# Patient Record
Sex: Female | Born: 1960 | Race: Black or African American | Hispanic: No | Marital: Single | Smoking: Former smoker
Health system: Southern US, Community
[De-identification: ages and names within clinical notes are randomized; demographics above are authoritative.]

## PROBLEM LIST (undated history)

## (undated) DIAGNOSIS — N62 Hypertrophy of breast: Secondary | ICD-10-CM

## (undated) DIAGNOSIS — M1990 Unspecified osteoarthritis, unspecified site: Secondary | ICD-10-CM

## (undated) DIAGNOSIS — F419 Anxiety disorder, unspecified: Secondary | ICD-10-CM

## (undated) DIAGNOSIS — E785 Hyperlipidemia, unspecified: Secondary | ICD-10-CM

## (undated) DIAGNOSIS — R519 Headache, unspecified: Secondary | ICD-10-CM

## (undated) DIAGNOSIS — E669 Obesity, unspecified: Secondary | ICD-10-CM

## (undated) DIAGNOSIS — G629 Polyneuropathy, unspecified: Secondary | ICD-10-CM

## (undated) DIAGNOSIS — I10 Essential (primary) hypertension: Secondary | ICD-10-CM

## (undated) DIAGNOSIS — Z780 Asymptomatic menopausal state: Secondary | ICD-10-CM

## (undated) DIAGNOSIS — T7840XA Allergy, unspecified, initial encounter: Secondary | ICD-10-CM

## (undated) DIAGNOSIS — M79606 Pain in leg, unspecified: Secondary | ICD-10-CM

## (undated) DIAGNOSIS — M549 Dorsalgia, unspecified: Secondary | ICD-10-CM

## (undated) DIAGNOSIS — F32A Depression, unspecified: Secondary | ICD-10-CM

## (undated) DIAGNOSIS — G4730 Sleep apnea, unspecified: Secondary | ICD-10-CM

## (undated) DIAGNOSIS — R7303 Prediabetes: Secondary | ICD-10-CM

## (undated) DIAGNOSIS — J45909 Unspecified asthma, uncomplicated: Secondary | ICD-10-CM

## (undated) HISTORY — DX: Polyneuropathy, unspecified: G62.9

## (undated) HISTORY — DX: Essential (primary) hypertension: I10

## (undated) HISTORY — DX: Sleep apnea, unspecified: G47.30

## (undated) HISTORY — PX: COLONOSCOPY: SHX174

## (undated) HISTORY — DX: Pain in leg, unspecified: M79.606

## (undated) HISTORY — PX: BACK SURGERY: SHX140

## (undated) HISTORY — PX: TUBAL LIGATION: SHX77

## (undated) HISTORY — DX: Obesity, unspecified: E66.9

## (undated) HISTORY — PX: POLYPECTOMY: SHX149

## (undated) HISTORY — PX: BREAST SURGERY: SHX581

## (undated) HISTORY — DX: Allergy, unspecified, initial encounter: T78.40XA

## (undated) HISTORY — DX: Dorsalgia, unspecified: M54.9

## (undated) HISTORY — DX: Hyperlipidemia, unspecified: E78.5

## (undated) HISTORY — DX: Asymptomatic menopausal state: Z78.0

## (undated) HISTORY — PX: OTHER SURGICAL HISTORY: SHX169

---

## 1998-03-11 ENCOUNTER — Emergency Department (HOSPITAL_COMMUNITY): Admission: EM | Admit: 1998-03-11 | Discharge: 1998-03-11 | Payer: Self-pay | Admitting: Emergency Medicine

## 1998-05-22 ENCOUNTER — Emergency Department (HOSPITAL_COMMUNITY): Admission: EM | Admit: 1998-05-22 | Discharge: 1998-05-22 | Payer: Self-pay | Admitting: Emergency Medicine

## 1998-05-22 ENCOUNTER — Encounter: Payer: Self-pay | Admitting: Emergency Medicine

## 1999-04-05 ENCOUNTER — Encounter: Admission: RE | Admit: 1999-04-05 | Discharge: 1999-04-05 | Payer: Self-pay | Admitting: Hematology and Oncology

## 1999-07-02 ENCOUNTER — Other Ambulatory Visit: Admission: RE | Admit: 1999-07-02 | Discharge: 1999-07-02 | Payer: Self-pay | Admitting: Internal Medicine

## 2000-03-11 ENCOUNTER — Other Ambulatory Visit: Admission: RE | Admit: 2000-03-11 | Discharge: 2000-03-11 | Payer: Self-pay | Admitting: Internal Medicine

## 2000-03-14 ENCOUNTER — Encounter: Payer: Self-pay | Admitting: Internal Medicine

## 2000-03-14 ENCOUNTER — Encounter: Admission: RE | Admit: 2000-03-14 | Discharge: 2000-03-14 | Payer: Self-pay | Admitting: Internal Medicine

## 2000-03-17 ENCOUNTER — Emergency Department (HOSPITAL_COMMUNITY): Admission: EM | Admit: 2000-03-17 | Discharge: 2000-03-18 | Payer: Self-pay | Admitting: Emergency Medicine

## 2000-05-25 ENCOUNTER — Emergency Department (HOSPITAL_COMMUNITY): Admission: EM | Admit: 2000-05-25 | Discharge: 2000-05-25 | Payer: Self-pay | Admitting: Emergency Medicine

## 2000-05-25 ENCOUNTER — Encounter: Payer: Self-pay | Admitting: Emergency Medicine

## 2000-06-23 ENCOUNTER — Emergency Department (HOSPITAL_COMMUNITY): Admission: EM | Admit: 2000-06-23 | Discharge: 2000-06-23 | Payer: Self-pay | Admitting: Emergency Medicine

## 2000-07-22 ENCOUNTER — Emergency Department (HOSPITAL_COMMUNITY): Admission: EM | Admit: 2000-07-22 | Discharge: 2000-07-22 | Payer: Self-pay | Admitting: Emergency Medicine

## 2000-07-30 ENCOUNTER — Encounter: Admission: RE | Admit: 2000-07-30 | Discharge: 2000-07-30 | Payer: Self-pay | Admitting: Family Medicine

## 2000-08-14 ENCOUNTER — Encounter: Admission: RE | Admit: 2000-08-14 | Discharge: 2000-08-14 | Payer: Self-pay | Admitting: Family Medicine

## 2000-08-28 ENCOUNTER — Encounter: Admission: RE | Admit: 2000-08-28 | Discharge: 2000-08-28 | Payer: Self-pay | Admitting: Family Medicine

## 2000-12-17 ENCOUNTER — Encounter: Admission: RE | Admit: 2000-12-17 | Discharge: 2000-12-17 | Payer: Self-pay | Admitting: Family Medicine

## 2000-12-30 ENCOUNTER — Encounter: Admission: RE | Admit: 2000-12-30 | Discharge: 2000-12-30 | Payer: Self-pay | Admitting: Family Medicine

## 2001-03-02 ENCOUNTER — Encounter: Admission: RE | Admit: 2001-03-02 | Discharge: 2001-03-02 | Payer: Self-pay | Admitting: Family Medicine

## 2001-03-10 ENCOUNTER — Encounter: Admission: RE | Admit: 2001-03-10 | Discharge: 2001-03-10 | Payer: Self-pay | Admitting: Family Medicine

## 2001-03-10 ENCOUNTER — Other Ambulatory Visit: Admission: RE | Admit: 2001-03-10 | Discharge: 2001-03-10 | Payer: Self-pay | Admitting: Family Medicine

## 2001-03-19 ENCOUNTER — Encounter: Admission: RE | Admit: 2001-03-19 | Discharge: 2001-03-19 | Payer: Self-pay | Admitting: Family Medicine

## 2001-03-29 ENCOUNTER — Emergency Department (HOSPITAL_COMMUNITY): Admission: EM | Admit: 2001-03-29 | Discharge: 2001-03-29 | Payer: Self-pay | Admitting: Emergency Medicine

## 2001-04-15 ENCOUNTER — Encounter: Admission: RE | Admit: 2001-04-15 | Discharge: 2001-04-15 | Payer: Self-pay

## 2001-07-09 ENCOUNTER — Emergency Department (HOSPITAL_COMMUNITY): Admission: EM | Admit: 2001-07-09 | Discharge: 2001-07-09 | Payer: Self-pay | Admitting: Emergency Medicine

## 2001-12-04 ENCOUNTER — Encounter: Admission: RE | Admit: 2001-12-04 | Discharge: 2001-12-04 | Payer: Self-pay | Admitting: Family Medicine

## 2002-02-20 ENCOUNTER — Emergency Department (HOSPITAL_COMMUNITY): Admission: EM | Admit: 2002-02-20 | Discharge: 2002-02-20 | Payer: Self-pay | Admitting: *Deleted

## 2002-09-01 ENCOUNTER — Emergency Department (HOSPITAL_COMMUNITY): Admission: EM | Admit: 2002-09-01 | Discharge: 2002-09-01 | Payer: Self-pay

## 2002-10-14 ENCOUNTER — Emergency Department (HOSPITAL_COMMUNITY): Admission: EM | Admit: 2002-10-14 | Discharge: 2002-10-14 | Payer: Self-pay | Admitting: Emergency Medicine

## 2002-11-18 ENCOUNTER — Encounter: Payer: Self-pay | Admitting: Sports Medicine

## 2002-11-18 ENCOUNTER — Encounter: Admission: RE | Admit: 2002-11-18 | Discharge: 2002-11-18 | Payer: Self-pay | Admitting: Sports Medicine

## 2003-01-10 ENCOUNTER — Emergency Department (HOSPITAL_COMMUNITY): Admission: EM | Admit: 2003-01-10 | Discharge: 2003-01-10 | Payer: Self-pay | Admitting: Emergency Medicine

## 2003-01-11 ENCOUNTER — Encounter: Admission: RE | Admit: 2003-01-11 | Discharge: 2003-01-11 | Payer: Self-pay | Admitting: Sports Medicine

## 2003-01-11 ENCOUNTER — Ambulatory Visit (HOSPITAL_COMMUNITY): Admission: RE | Admit: 2003-01-11 | Discharge: 2003-01-11 | Payer: Self-pay | Admitting: Sports Medicine

## 2003-02-04 ENCOUNTER — Encounter: Admission: RE | Admit: 2003-02-04 | Discharge: 2003-02-04 | Payer: Self-pay | Admitting: Sports Medicine

## 2003-02-04 ENCOUNTER — Ambulatory Visit (HOSPITAL_COMMUNITY): Admission: RE | Admit: 2003-02-04 | Discharge: 2003-02-04 | Payer: Self-pay | Admitting: Sports Medicine

## 2003-02-11 ENCOUNTER — Encounter: Admission: RE | Admit: 2003-02-11 | Discharge: 2003-02-11 | Payer: Self-pay | Admitting: Family Medicine

## 2003-03-14 ENCOUNTER — Encounter: Admission: RE | Admit: 2003-03-14 | Discharge: 2003-03-14 | Payer: Self-pay | Admitting: Family Medicine

## 2003-07-30 ENCOUNTER — Emergency Department (HOSPITAL_COMMUNITY): Admission: EM | Admit: 2003-07-30 | Discharge: 2003-07-31 | Payer: Self-pay | Admitting: Emergency Medicine

## 2003-12-23 ENCOUNTER — Encounter (INDEPENDENT_AMBULATORY_CARE_PROVIDER_SITE_OTHER): Payer: Self-pay | Admitting: *Deleted

## 2003-12-23 LAB — CONVERTED CEMR LAB

## 2004-01-05 ENCOUNTER — Encounter: Admission: RE | Admit: 2004-01-05 | Discharge: 2004-01-05 | Payer: Self-pay | Admitting: Family Medicine

## 2004-01-25 ENCOUNTER — Ambulatory Visit: Payer: Self-pay | Admitting: Family Medicine

## 2004-01-25 ENCOUNTER — Encounter: Admission: RE | Admit: 2004-01-25 | Discharge: 2004-01-25 | Payer: Self-pay | Admitting: Sports Medicine

## 2004-06-11 ENCOUNTER — Emergency Department (HOSPITAL_COMMUNITY): Admission: EM | Admit: 2004-06-11 | Discharge: 2004-06-11 | Payer: Self-pay | Admitting: Emergency Medicine

## 2004-10-01 ENCOUNTER — Emergency Department (HOSPITAL_COMMUNITY): Admission: EM | Admit: 2004-10-01 | Discharge: 2004-10-01 | Payer: Self-pay | Admitting: Emergency Medicine

## 2004-10-09 ENCOUNTER — Ambulatory Visit: Payer: Self-pay | Admitting: Family Medicine

## 2005-06-24 ENCOUNTER — Emergency Department (HOSPITAL_COMMUNITY): Admission: EM | Admit: 2005-06-24 | Discharge: 2005-06-25 | Payer: Self-pay | Admitting: Emergency Medicine

## 2005-08-15 ENCOUNTER — Emergency Department (HOSPITAL_COMMUNITY): Admission: EM | Admit: 2005-08-15 | Discharge: 2005-08-15 | Payer: Self-pay | Admitting: Emergency Medicine

## 2005-12-05 ENCOUNTER — Ambulatory Visit: Payer: Self-pay | Admitting: Family Medicine

## 2005-12-06 ENCOUNTER — Ambulatory Visit: Payer: Self-pay | Admitting: *Deleted

## 2006-01-13 ENCOUNTER — Ambulatory Visit: Payer: Self-pay | Admitting: Family Medicine

## 2006-03-05 ENCOUNTER — Ambulatory Visit: Payer: Self-pay | Admitting: Family Medicine

## 2006-04-03 ENCOUNTER — Ambulatory Visit: Payer: Self-pay | Admitting: Nurse Practitioner

## 2006-05-08 ENCOUNTER — Ambulatory Visit: Payer: Self-pay | Admitting: Family Medicine

## 2006-07-17 DIAGNOSIS — E669 Obesity, unspecified: Secondary | ICD-10-CM | POA: Insufficient documentation

## 2006-07-17 DIAGNOSIS — I10 Essential (primary) hypertension: Secondary | ICD-10-CM

## 2006-07-18 ENCOUNTER — Encounter (INDEPENDENT_AMBULATORY_CARE_PROVIDER_SITE_OTHER): Payer: Self-pay | Admitting: *Deleted

## 2006-07-24 ENCOUNTER — Ambulatory Visit: Payer: Self-pay | Admitting: Family Medicine

## 2006-08-29 ENCOUNTER — Ambulatory Visit: Payer: Self-pay | Admitting: Internal Medicine

## 2006-09-01 ENCOUNTER — Ambulatory Visit: Payer: Self-pay | Admitting: Family Medicine

## 2006-11-05 ENCOUNTER — Emergency Department (HOSPITAL_COMMUNITY): Admission: EM | Admit: 2006-11-05 | Discharge: 2006-11-05 | Payer: Self-pay | Admitting: Emergency Medicine

## 2007-01-20 ENCOUNTER — Telehealth (INDEPENDENT_AMBULATORY_CARE_PROVIDER_SITE_OTHER): Payer: Self-pay | Admitting: *Deleted

## 2007-01-21 ENCOUNTER — Ambulatory Visit: Payer: Self-pay | Admitting: Family Medicine

## 2007-01-21 LAB — CONVERTED CEMR LAB: Beta hcg, urine, semiquantitative: NEGATIVE

## 2007-02-03 ENCOUNTER — Telehealth (INDEPENDENT_AMBULATORY_CARE_PROVIDER_SITE_OTHER): Payer: Self-pay | Admitting: Family Medicine

## 2007-02-04 ENCOUNTER — Encounter (INDEPENDENT_AMBULATORY_CARE_PROVIDER_SITE_OTHER): Payer: Self-pay | Admitting: *Deleted

## 2007-02-04 ENCOUNTER — Ambulatory Visit: Payer: Self-pay | Admitting: Family Medicine

## 2007-02-06 ENCOUNTER — Ambulatory Visit: Payer: Self-pay | Admitting: Family Medicine

## 2007-02-06 ENCOUNTER — Encounter (INDEPENDENT_AMBULATORY_CARE_PROVIDER_SITE_OTHER): Payer: Self-pay | Admitting: Family Medicine

## 2007-02-06 LAB — CONVERTED CEMR LAB
Calcium: 9 mg/dL (ref 8.4–10.5)
Chloride: 106 meq/L (ref 96–112)
HDL: 39 mg/dL — ABNORMAL LOW (ref >39)
LDL Cholesterol: 151 mg/dL — ABNORMAL HIGH (ref 0–99)
Potassium: 3.6 meq/L (ref 3.5–5.3)
Sodium: 141 meq/L (ref 135–145)
Triglycerides: 102 mg/dL (ref <150)

## 2007-02-09 ENCOUNTER — Telehealth: Payer: Self-pay | Admitting: *Deleted

## 2007-04-06 ENCOUNTER — Encounter (INDEPENDENT_AMBULATORY_CARE_PROVIDER_SITE_OTHER): Payer: Self-pay | Admitting: *Deleted

## 2007-04-06 ENCOUNTER — Ambulatory Visit: Payer: Self-pay | Admitting: Family Medicine

## 2007-04-06 ENCOUNTER — Ambulatory Visit (HOSPITAL_COMMUNITY): Admission: RE | Admit: 2007-04-06 | Discharge: 2007-04-06 | Payer: Self-pay | Admitting: Family Medicine

## 2007-04-06 ENCOUNTER — Telehealth: Payer: Self-pay | Admitting: *Deleted

## 2007-04-09 ENCOUNTER — Encounter (INDEPENDENT_AMBULATORY_CARE_PROVIDER_SITE_OTHER): Payer: Self-pay | Admitting: Family Medicine

## 2007-06-07 ENCOUNTER — Emergency Department (HOSPITAL_COMMUNITY): Admission: EM | Admit: 2007-06-07 | Discharge: 2007-06-07 | Payer: Self-pay | Admitting: Emergency Medicine

## 2007-06-12 ENCOUNTER — Ambulatory Visit: Payer: Self-pay | Admitting: Family Medicine

## 2007-06-12 DIAGNOSIS — E785 Hyperlipidemia, unspecified: Secondary | ICD-10-CM | POA: Insufficient documentation

## 2007-06-12 LAB — CONVERTED CEMR LAB: HDL goal, serum: 40 mg/dL

## 2007-06-18 ENCOUNTER — Encounter: Admission: RE | Admit: 2007-06-18 | Discharge: 2007-06-18 | Payer: Self-pay | Admitting: Family Medicine

## 2007-06-22 ENCOUNTER — Ambulatory Visit: Payer: Self-pay | Admitting: Family Medicine

## 2007-06-29 ENCOUNTER — Other Ambulatory Visit: Admission: RE | Admit: 2007-06-29 | Discharge: 2007-06-29 | Payer: Self-pay | Admitting: Family Medicine

## 2007-06-29 ENCOUNTER — Encounter (INDEPENDENT_AMBULATORY_CARE_PROVIDER_SITE_OTHER): Payer: Self-pay | Admitting: Family Medicine

## 2007-06-29 ENCOUNTER — Ambulatory Visit: Payer: Self-pay | Admitting: Sports Medicine

## 2007-07-02 ENCOUNTER — Encounter (INDEPENDENT_AMBULATORY_CARE_PROVIDER_SITE_OTHER): Payer: Self-pay | Admitting: Family Medicine

## 2007-10-14 ENCOUNTER — Telehealth: Payer: Self-pay | Admitting: *Deleted

## 2007-10-21 ENCOUNTER — Emergency Department (HOSPITAL_COMMUNITY): Admission: EM | Admit: 2007-10-21 | Discharge: 2007-10-21 | Payer: Self-pay | Admitting: Emergency Medicine

## 2007-11-09 ENCOUNTER — Ambulatory Visit: Payer: Self-pay | Admitting: Family Medicine

## 2007-11-09 ENCOUNTER — Encounter: Payer: Self-pay | Admitting: *Deleted

## 2007-12-07 ENCOUNTER — Ambulatory Visit: Payer: Self-pay | Admitting: Family Medicine

## 2007-12-10 ENCOUNTER — Telehealth: Payer: Self-pay | Admitting: *Deleted

## 2007-12-11 ENCOUNTER — Encounter: Payer: Self-pay | Admitting: *Deleted

## 2007-12-11 ENCOUNTER — Telehealth: Payer: Self-pay | Admitting: *Deleted

## 2007-12-11 ENCOUNTER — Ambulatory Visit: Payer: Self-pay | Admitting: Family Medicine

## 2007-12-11 ENCOUNTER — Encounter: Admission: RE | Admit: 2007-12-11 | Discharge: 2007-12-11 | Payer: Self-pay | Admitting: Family Medicine

## 2007-12-14 ENCOUNTER — Ambulatory Visit: Payer: Self-pay | Admitting: Family Medicine

## 2007-12-14 ENCOUNTER — Encounter: Payer: Self-pay | Admitting: Family Medicine

## 2007-12-16 ENCOUNTER — Encounter: Payer: Self-pay | Admitting: *Deleted

## 2007-12-16 ENCOUNTER — Telehealth: Payer: Self-pay | Admitting: Family Medicine

## 2007-12-16 ENCOUNTER — Encounter: Admission: RE | Admit: 2007-12-16 | Discharge: 2007-12-16 | Payer: Self-pay | Admitting: Family Medicine

## 2007-12-16 ENCOUNTER — Telehealth: Payer: Self-pay | Admitting: *Deleted

## 2007-12-18 ENCOUNTER — Telehealth: Payer: Self-pay | Admitting: *Deleted

## 2007-12-18 ENCOUNTER — Ambulatory Visit: Payer: Self-pay | Admitting: Family Medicine

## 2007-12-21 ENCOUNTER — Telehealth: Payer: Self-pay | Admitting: *Deleted

## 2007-12-22 ENCOUNTER — Ambulatory Visit: Payer: Self-pay | Admitting: Family Medicine

## 2007-12-22 ENCOUNTER — Encounter: Payer: Self-pay | Admitting: *Deleted

## 2007-12-30 ENCOUNTER — Encounter: Payer: Self-pay | Admitting: Family Medicine

## 2008-01-20 HISTORY — PX: LAMINECTOMY: SHX219

## 2008-01-27 ENCOUNTER — Inpatient Hospital Stay (HOSPITAL_COMMUNITY): Admission: RE | Admit: 2008-01-27 | Discharge: 2008-01-29 | Payer: Self-pay | Admitting: Orthopedic Surgery

## 2008-01-27 ENCOUNTER — Encounter: Payer: Self-pay | Admitting: Family Medicine

## 2008-05-11 ENCOUNTER — Encounter: Payer: Self-pay | Admitting: Family Medicine

## 2008-05-16 ENCOUNTER — Ambulatory Visit: Payer: Self-pay | Admitting: Family Medicine

## 2008-07-11 ENCOUNTER — Other Ambulatory Visit: Admission: RE | Admit: 2008-07-11 | Discharge: 2008-07-11 | Payer: Self-pay | Admitting: Family Medicine

## 2008-07-11 ENCOUNTER — Encounter (INDEPENDENT_AMBULATORY_CARE_PROVIDER_SITE_OTHER): Payer: Self-pay | Admitting: Family Medicine

## 2008-07-11 ENCOUNTER — Ambulatory Visit: Payer: Self-pay | Admitting: Family Medicine

## 2008-07-13 ENCOUNTER — Ambulatory Visit: Payer: Self-pay | Admitting: Family Medicine

## 2008-07-13 ENCOUNTER — Encounter: Admission: RE | Admit: 2008-07-13 | Discharge: 2008-07-13 | Payer: Self-pay | Admitting: Family Medicine

## 2008-07-13 ENCOUNTER — Encounter: Payer: Self-pay | Admitting: Family Medicine

## 2008-07-13 LAB — CONVERTED CEMR LAB
ALT: 59 units/L — ABNORMAL HIGH (ref 0–35)
Alkaline Phosphatase: 116 units/L (ref 39–117)
BUN: 11 mg/dL (ref 6–23)
Calcium: 9.2 mg/dL (ref 8.4–10.5)
Chloride: 105 meq/L (ref 96–112)
Cholesterol: 227 mg/dL — ABNORMAL HIGH (ref 0–200)
Creatinine, Ser: 0.87 mg/dL (ref 0.40–1.20)
LDL Cholesterol: 166 mg/dL — ABNORMAL HIGH (ref 0–99)
Sodium: 141 meq/L (ref 135–145)
Total Bilirubin: 0.6 mg/dL (ref 0.3–1.2)
Total CHOL/HDL Ratio: 6
VLDL: 23 mg/dL (ref 0–40)
WBC: 6.8 10*3/uL (ref 4.0–10.5)

## 2008-07-18 ENCOUNTER — Encounter: Payer: Self-pay | Admitting: Family Medicine

## 2008-09-05 ENCOUNTER — Ambulatory Visit: Payer: Self-pay | Admitting: Family Medicine

## 2008-09-05 ENCOUNTER — Encounter: Payer: Self-pay | Admitting: Family Medicine

## 2008-09-05 DIAGNOSIS — N951 Menopausal and female climacteric states: Secondary | ICD-10-CM

## 2008-09-15 ENCOUNTER — Telehealth: Payer: Self-pay | Admitting: Family Medicine

## 2008-09-15 ENCOUNTER — Ambulatory Visit: Payer: Self-pay | Admitting: Family Medicine

## 2008-09-15 DIAGNOSIS — J309 Allergic rhinitis, unspecified: Secondary | ICD-10-CM | POA: Insufficient documentation

## 2008-09-15 LAB — CONVERTED CEMR LAB: Rapid Strep: POSITIVE

## 2008-09-16 ENCOUNTER — Telehealth: Payer: Self-pay | Admitting: Family Medicine

## 2008-09-21 ENCOUNTER — Ambulatory Visit: Payer: Self-pay | Admitting: Family Medicine

## 2008-09-28 ENCOUNTER — Encounter: Admission: RE | Admit: 2008-09-28 | Discharge: 2008-10-26 | Payer: Self-pay | Admitting: Family Medicine

## 2008-10-18 ENCOUNTER — Telehealth: Payer: Self-pay | Admitting: Family Medicine

## 2008-10-19 ENCOUNTER — Ambulatory Visit: Payer: Self-pay | Admitting: Family Medicine

## 2008-10-24 ENCOUNTER — Encounter: Payer: Self-pay | Admitting: Family Medicine

## 2008-10-25 ENCOUNTER — Ambulatory Visit: Payer: Self-pay | Admitting: Family Medicine

## 2008-10-25 ENCOUNTER — Encounter: Payer: Self-pay | Admitting: Family Medicine

## 2008-12-14 ENCOUNTER — Encounter
Admission: RE | Admit: 2008-12-14 | Discharge: 2009-03-14 | Payer: Self-pay | Admitting: Physical Medicine & Rehabilitation

## 2008-12-15 ENCOUNTER — Ambulatory Visit: Payer: Self-pay | Admitting: Physical Medicine & Rehabilitation

## 2008-12-15 ENCOUNTER — Encounter: Payer: Self-pay | Admitting: Family Medicine

## 2008-12-27 ENCOUNTER — Encounter: Payer: Self-pay | Admitting: Family Medicine

## 2008-12-27 ENCOUNTER — Ambulatory Visit: Payer: Self-pay | Admitting: Family Medicine

## 2009-01-18 ENCOUNTER — Ambulatory Visit: Payer: Self-pay | Admitting: Family Medicine

## 2009-01-27 ENCOUNTER — Encounter: Payer: Self-pay | Admitting: Family Medicine

## 2009-02-03 ENCOUNTER — Encounter: Payer: Self-pay | Admitting: Family Medicine

## 2009-02-08 ENCOUNTER — Emergency Department (HOSPITAL_COMMUNITY): Admission: EM | Admit: 2009-02-08 | Discharge: 2009-02-09 | Payer: Self-pay | Admitting: Emergency Medicine

## 2009-02-10 ENCOUNTER — Ambulatory Visit: Admission: RE | Admit: 2009-02-10 | Discharge: 2009-02-10 | Payer: Self-pay | Admitting: Family Medicine

## 2009-02-10 ENCOUNTER — Ambulatory Visit: Payer: Self-pay | Admitting: Vascular Surgery

## 2009-02-10 ENCOUNTER — Ambulatory Visit: Payer: Self-pay | Admitting: Family Medicine

## 2009-02-10 ENCOUNTER — Encounter (INDEPENDENT_AMBULATORY_CARE_PROVIDER_SITE_OTHER): Payer: Self-pay | Admitting: Family Medicine

## 2009-02-14 ENCOUNTER — Encounter
Admission: RE | Admit: 2009-02-14 | Discharge: 2009-02-14 | Payer: Self-pay | Admitting: Physical Medicine & Rehabilitation

## 2009-02-20 ENCOUNTER — Encounter: Payer: Self-pay | Admitting: Family Medicine

## 2009-02-20 ENCOUNTER — Ambulatory Visit: Payer: Self-pay | Admitting: Family Medicine

## 2009-03-15 ENCOUNTER — Telehealth: Payer: Self-pay | Admitting: Family Medicine

## 2009-04-06 ENCOUNTER — Ambulatory Visit: Payer: Self-pay | Admitting: Family Medicine

## 2009-04-06 DIAGNOSIS — R609 Edema, unspecified: Secondary | ICD-10-CM | POA: Insufficient documentation

## 2009-04-07 ENCOUNTER — Encounter: Payer: Self-pay | Admitting: Family Medicine

## 2009-04-07 ENCOUNTER — Encounter: Payer: Self-pay | Admitting: *Deleted

## 2009-04-07 ENCOUNTER — Telehealth (INDEPENDENT_AMBULATORY_CARE_PROVIDER_SITE_OTHER): Payer: Self-pay | Admitting: Family Medicine

## 2009-04-07 ENCOUNTER — Encounter: Admission: RE | Admit: 2009-04-07 | Discharge: 2009-04-07 | Payer: Self-pay | Admitting: Family Medicine

## 2009-04-20 ENCOUNTER — Encounter: Payer: Self-pay | Admitting: Family Medicine

## 2009-05-03 ENCOUNTER — Encounter: Payer: Self-pay | Admitting: Family Medicine

## 2009-05-03 ENCOUNTER — Ambulatory Visit: Payer: Self-pay | Admitting: Family Medicine

## 2009-05-03 DIAGNOSIS — R269 Unspecified abnormalities of gait and mobility: Secondary | ICD-10-CM

## 2009-05-31 ENCOUNTER — Encounter: Payer: Self-pay | Admitting: Family Medicine

## 2009-05-31 ENCOUNTER — Ambulatory Visit: Payer: Self-pay | Admitting: Family Medicine

## 2009-05-31 LAB — CONVERTED CEMR LAB: Direct LDL: 119 mg/dL — ABNORMAL HIGH

## 2009-06-01 ENCOUNTER — Encounter: Payer: Self-pay | Admitting: Family Medicine

## 2009-06-12 ENCOUNTER — Telehealth (INDEPENDENT_AMBULATORY_CARE_PROVIDER_SITE_OTHER): Payer: Self-pay | Admitting: *Deleted

## 2009-06-20 ENCOUNTER — Encounter: Admission: RE | Admit: 2009-06-20 | Discharge: 2009-07-13 | Payer: Self-pay | Admitting: Family Medicine

## 2009-06-27 ENCOUNTER — Telehealth: Payer: Self-pay | Admitting: Family Medicine

## 2009-06-27 ENCOUNTER — Encounter: Payer: Self-pay | Admitting: Family Medicine

## 2009-06-28 ENCOUNTER — Telehealth: Payer: Self-pay | Admitting: Family Medicine

## 2009-06-28 ENCOUNTER — Encounter: Payer: Self-pay | Admitting: Family Medicine

## 2009-07-04 ENCOUNTER — Telehealth: Payer: Self-pay | Admitting: Family Medicine

## 2009-07-10 ENCOUNTER — Encounter: Payer: Self-pay | Admitting: Family Medicine

## 2009-07-10 ENCOUNTER — Ambulatory Visit: Payer: Self-pay | Admitting: Family Medicine

## 2009-07-12 ENCOUNTER — Encounter: Payer: Self-pay | Admitting: Family Medicine

## 2009-07-17 ENCOUNTER — Encounter: Payer: Self-pay | Admitting: Family Medicine

## 2009-07-19 ENCOUNTER — Encounter: Payer: Self-pay | Admitting: Family Medicine

## 2009-07-19 ENCOUNTER — Telehealth: Payer: Self-pay | Admitting: Family Medicine

## 2009-07-21 ENCOUNTER — Encounter: Payer: Self-pay | Admitting: Family Medicine

## 2009-09-20 ENCOUNTER — Ambulatory Visit: Payer: Self-pay | Admitting: Family Medicine

## 2009-09-20 ENCOUNTER — Encounter: Payer: Self-pay | Admitting: Family Medicine

## 2009-09-20 LAB — CONVERTED CEMR LAB
Albumin: 3.8 g/dL (ref 3.5–5.2)
Alkaline Phosphatase: 110 units/L (ref 39–117)
BUN: 14 mg/dL (ref 6–23)
Chloride: 101 meq/L (ref 96–112)
Glucose, Bld: 95 mg/dL (ref 70–99)
Sodium: 140 meq/L (ref 135–145)

## 2009-09-21 ENCOUNTER — Encounter: Payer: Self-pay | Admitting: Family Medicine

## 2009-10-23 ENCOUNTER — Ambulatory Visit: Payer: Self-pay | Admitting: Family Medicine

## 2009-10-24 ENCOUNTER — Encounter: Payer: Self-pay | Admitting: Family Medicine

## 2009-10-26 ENCOUNTER — Ambulatory Visit (HOSPITAL_COMMUNITY): Admission: RE | Admit: 2009-10-26 | Discharge: 2009-10-26 | Payer: Self-pay | Admitting: Family Medicine

## 2009-10-26 ENCOUNTER — Encounter: Payer: Self-pay | Admitting: Family Medicine

## 2009-11-06 ENCOUNTER — Telehealth: Payer: Self-pay | Admitting: Family Medicine

## 2009-11-14 ENCOUNTER — Encounter: Payer: Self-pay | Admitting: Family Medicine

## 2009-11-14 ENCOUNTER — Ambulatory Visit: Payer: Self-pay | Admitting: Family Medicine

## 2009-11-14 LAB — CONVERTED CEMR LAB
Chlamydia, DNA Probe: NEGATIVE
GC Probe Amp, Genital: NEGATIVE

## 2009-11-15 ENCOUNTER — Encounter: Payer: Self-pay | Admitting: Family Medicine

## 2009-11-16 ENCOUNTER — Telehealth (INDEPENDENT_AMBULATORY_CARE_PROVIDER_SITE_OTHER): Payer: Self-pay | Admitting: *Deleted

## 2009-12-11 ENCOUNTER — Telehealth: Payer: Self-pay | Admitting: *Deleted

## 2009-12-15 ENCOUNTER — Encounter: Payer: Self-pay | Admitting: Family Medicine

## 2009-12-15 ENCOUNTER — Ambulatory Visit: Payer: Self-pay | Admitting: Family Medicine

## 2009-12-25 ENCOUNTER — Encounter: Payer: Self-pay | Admitting: Family Medicine

## 2009-12-25 LAB — CONVERTED CEMR LAB
ALT: 11 units/L (ref 0–35)
Albumin: 3.7 g/dL (ref 3.5–5.2)
Alkaline Phosphatase: 99 units/L (ref 39–117)
BUN: 6 mg/dL (ref 6–23)
HCT: 40.6 % (ref 36.0–46.0)
Platelets: 265 10*3/uL (ref 150–400)
Potassium: 3.7 meq/L (ref 3.5–5.3)
RBC: 4.92 M/uL (ref 3.87–5.11)
Total Bilirubin: 0.4 mg/dL (ref 0.3–1.2)
Total CK: 228 units/L — ABNORMAL HIGH (ref 7–177)
Total Protein: 6.6 g/dL (ref 6.0–8.3)
WBC: 8.1 10*3/uL (ref 4.0–10.5)

## 2009-12-28 ENCOUNTER — Encounter: Payer: Self-pay | Admitting: *Deleted

## 2010-01-02 ENCOUNTER — Telehealth: Payer: Self-pay | Admitting: *Deleted

## 2010-01-16 ENCOUNTER — Ambulatory Visit: Payer: Self-pay | Admitting: Family Medicine

## 2010-02-13 ENCOUNTER — Ambulatory Visit: Payer: Self-pay | Admitting: Family Medicine

## 2010-04-16 ENCOUNTER — Ambulatory Visit: Payer: Self-pay | Admitting: Family Medicine

## 2010-04-16 ENCOUNTER — Encounter: Payer: Self-pay | Admitting: Family Medicine

## 2010-04-16 DIAGNOSIS — G609 Hereditary and idiopathic neuropathy, unspecified: Secondary | ICD-10-CM | POA: Insufficient documentation

## 2010-04-16 LAB — CONVERTED CEMR LAB: Total CK: 226 units/L — ABNORMAL HIGH (ref 7–177)

## 2010-04-18 ENCOUNTER — Encounter (INDEPENDENT_AMBULATORY_CARE_PROVIDER_SITE_OTHER): Payer: Self-pay | Admitting: *Deleted

## 2010-04-24 ENCOUNTER — Telehealth (INDEPENDENT_AMBULATORY_CARE_PROVIDER_SITE_OTHER): Payer: Self-pay | Admitting: *Deleted

## 2010-04-26 ENCOUNTER — Ambulatory Visit: Payer: Self-pay | Admitting: Family Medicine

## 2010-04-26 DIAGNOSIS — M5440 Lumbago with sciatica, unspecified side: Secondary | ICD-10-CM | POA: Insufficient documentation

## 2010-04-28 ENCOUNTER — Encounter
Admission: RE | Admit: 2010-04-28 | Discharge: 2010-04-28 | Payer: Self-pay | Source: Home / Self Care | Attending: Family Medicine | Admitting: Family Medicine

## 2010-04-30 ENCOUNTER — Telehealth: Payer: Self-pay | Admitting: Family Medicine

## 2010-05-01 ENCOUNTER — Telehealth: Payer: Self-pay | Admitting: *Deleted

## 2010-05-02 ENCOUNTER — Encounter: Payer: Self-pay | Admitting: Family Medicine

## 2010-05-07 ENCOUNTER — Encounter: Payer: Self-pay | Admitting: Family Medicine

## 2010-05-08 ENCOUNTER — Encounter: Payer: Self-pay | Admitting: Family Medicine

## 2010-05-08 ENCOUNTER — Encounter (INDEPENDENT_AMBULATORY_CARE_PROVIDER_SITE_OTHER): Payer: Self-pay | Admitting: *Deleted

## 2010-05-17 ENCOUNTER — Ambulatory Visit: Admission: RE | Admit: 2010-05-17 | Discharge: 2010-05-17 | Payer: Self-pay | Source: Home / Self Care

## 2010-05-17 ENCOUNTER — Encounter: Payer: Self-pay | Admitting: Family Medicine

## 2010-06-01 ENCOUNTER — Telehealth: Payer: Self-pay | Admitting: *Deleted

## 2010-06-05 ENCOUNTER — Encounter: Payer: Self-pay | Admitting: *Deleted

## 2010-06-10 ENCOUNTER — Encounter: Payer: Self-pay | Admitting: Family Medicine

## 2010-06-15 ENCOUNTER — Ambulatory Visit: Admission: RE | Admit: 2010-06-15 | Discharge: 2010-06-15 | Payer: Self-pay | Source: Home / Self Care

## 2010-06-19 NOTE — Miscellaneous (Signed)
Summary: phone call and gynecology referral   Clinical Lists Changes  Problems: Assessed SUPRAPUBIC PAIN as unchanged -  wet prep consistent with BV, but gonorrhea and chlamydia negative.  treat  BV with flagyl called in to her pharmacy already.  called pt to let her know, but no answer and no vm.  Will refer to gynecology for evaluation of pain and fibroids.  will also send letter to let her know about results  Her updated medication list for this problem includes:    Norco 5-325 Mg Tabs (Hydrocodone-acetaminophen) .Marland Kitchen... 1 tab by mouth two times a day as needed severe pain not controlled with neurontin/tylenol  Orders: Gynecologic Referral (Gyn)  - Signed Orders: Added new Referral order of Gynecologic Referral (Gyn) - Signed      Impression & Recommendations:  Problem # 1:  SUPRAPUBIC PAIN (ICD-789.09) Assessment Unchanged  wet prep consistent with BV, but gonorrhea and chlamydia negative.  treat  BV with flagyl called in to her pharmacy already.  called pt to let her know, but no answer and no vm.  Will refer to gynecology for evaluation of pain and fibroids.  will also send letter to let her know about results  Her updated medication list for this problem includes:    Norco 5-325 Mg Tabs (Hydrocodone-acetaminophen) .Marland Kitchen... 1 tab by mouth two times a day as needed severe pain not controlled with neurontin/tylenol  Orders: Gynecologic Referral (Gyn)  Complete Medication List: 1)  Hydrochlorothiazide 25 Mg Tabs (Hydrochlorothiazide) .... Take 1/2  tablet daily for blood pressure control 2)  Neurontin 300 Mg Caps (Gabapentin) .... 2 tablets by mouth in am.  2 tablets by mouth mid-day; 3 tablets by mouth at night 3)  Cetirizine Hcl 10 Mg Tabs (Cetirizine hcl) .Marland Kitchen.. 1 tab by mouth daily for allergies 4)  Norco 5-325 Mg Tabs (Hydrocodone-acetaminophen) .Marland Kitchen.. 1 tab by mouth two times a day as needed severe pain not controlled with neurontin/tylenol 5)  Simvastatin 80 Mg Tabs  (Simvastatin) .Marland Kitchen.. 1 tab by mouth daily for cholesterol 6)  Amlodipine Besylate 10 Mg Tabs (Amlodipine besylate) .Marland Kitchen.. 1 tab by mouth daily for blood pressure 7)  Nortriptyline Hcl 25 Mg Caps (Nortriptyline hcl) .Marland Kitchen.. 1 tab by mouth at bedtime for 3 days then 2 tabs by mouth at bedtime for sleep 8)  Metronidazole 500 Mg Tabs (Metronidazole) .Marland Kitchen.. 1 tab by mouth two times a day for infection for 7 days

## 2010-06-19 NOTE — Letter (Signed)
Summary: Generic Letter  [facility]  [address]   [place], [place] [address]   Phone: [phone]  Fax: [phone]    07/19/2009  Kristen [name] [address] [place], [place]  [address]  To whom it may concern,  [name] has severe back pathology.  She would benefit considerably from living in a home that does not have stairs.   Please call me if you have any questions or concerns.              Sincerely,   Asher Muir MD

## 2010-06-19 NOTE — Progress Notes (Signed)
Summary: Rx Ques   Phone Note Call from Patient Call back at [phone]   Caller: Patient Summary of Call: Pt was calling to make sure the antibotic that Dr. Lafonda Mosses was to call in for her today was sent to the [facility] [address] Initial call taken by: Clydell Hakim,  November 16, 2009 3:08 PM  Follow-up for Phone Call        rx was sent to [facility] [address]  by Dr. Lafonda Mosses. RN cancelled that rx and sent electronically to [facility] [address] as requested by patient. Follow-up by: Theresia Lo RN,  November 16, 2009 3:56 PM

## 2010-06-19 NOTE — Progress Notes (Signed)
Summary: Letter Needed   Phone Note Call from Patient Call back at Work Phone [phone]   Caller: Patient Summary of Call: Needs the letter that was first done in December to reflect that her daughter is needed to help her until her return appt on Feb. 28th.  The attorney needs this letter also actually signed by the doctor.  Needs this as soon as possible. Initial call taken by: Clydell Hakim,  June 27, 2009 10:11 AM  Follow-up for Phone Call        To PCP Follow-up by: Gladstone Pih,  June 27, 2009 10:26 AM  Additional Follow-up for Phone Call Additional follow up Details #1::        placed letter in to be called box (signed) Additional Follow-up by: Asher Muir MD,  June 27, 2009 1:59 PM    Additional Follow-up for Phone Call Additional follow up Details #2::    Pt notified by Tammy Follow-up by: Gladstone Pih,  June 27, 2009 2:29 PM

## 2010-06-19 NOTE — Letter (Signed)
Summary: Lipid Letter  [facility]  [address]   [place], [place] [address]   Phone: [phone]  Fax: [phone]    12/25/2009  [facility] [address] [place], [place]  [address]  Dear Kristen [name]:  We have carefully reviewed your last lipid profile from 07/13/2008 and the results are noted below with a summary of recommendations for lipid management.    LDL "bad" Cholesterol:   195     Goal: <130  Your last check in January 2011 had an LDL of 119.    Your lipid goals have not been met; we recommend improving on your TLC diet and Adjunctive Measures (see below) and make the following changes to your regimen: I will go ahead and switch your simvastatin to atorvastatin 40 mg  daily as we talked about at your last visit.  I will send a prescription to your pharmacy and will fill out prior authorization as necessary.  We can further discuss your cholesterol at your next visit.    TLC Diet (Therapeutic Lifestyle Change): Saturated Fats & Transfatty acids should be kept < 7% of total calories ***Reduce Saturated Fats Polyunstaurated Fat can be up to 10% of total calories Monounsaturated Fat Fat can be up to 20% of total calories Total Fat should be no greater than 25-35% of total calories Carbohydrates should be 50-60% of total calories Protein should be approximately 15% of total calories Fiber should be at least 20-30 grams a day ***Increased fiber may help lower LDL Total Cholesterol should be < 200mg /day Consider adding plant stanol/sterols to diet (example: Benacol spread) ***A higher intake of unsaturated fat may reduce Triglycerides and Increase HDL    Adjunctive Measures (may lower LIPIDS and reduce risk of Heart Attack) include: Aerobic Exercise (20-30 minutes 3-4 times a week) Limit Alcohol Consumption Weight Reduction Aspirin 75-81 mg a day by mouth (if not allergic or contraindicated) Dietary Fiber 20-30 grams a day by mouth  If you have  any questions, please call. We appreciate being able to work with you.   Sincerely,    [facility] Delbert Harness MD  Appended Document: Lipid Letter mailed  Appended Document: statin change Medications Added CRESTOR 20 MG TABS (ROSUVASTATIN CALCIUM) one half tablet daily for 2 weeks, then 1 tablet daily          Clinical Lists Changes  Medications: Changed medication from LIPITOR 40 MG TABS (ATORVASTATIN CALCIUM) take one tablet daily for cholesterol instead of simvastatin to CRESTOR 20 MG TABS (ROSUVASTATIN CALCIUM) one half tablet daily for 2 weeks, then 1 tablet daily - Signed Rx of CRESTOR 20 MG TABS (ROSUVASTATIN CALCIUM) one half tablet daily for 2 weeks, then 1 tablet daily;  #30 x 0;  Signed;  Entered by: Delbert Harness MD;  Authorized by: Delbert Harness MD;  Method used: [phone]*, [address], [place], [place], [place]  [address], Ph: [phone], Fax: [phone]    Prescriptions: CRESTOR 20 MG TABS (ROSUVASTATIN CALCIUM) one half tablet daily for 2 weeks, then 1 tablet daily  #30 x 0   Entered and Authorized by:   Delbert Harness MD   Signed by:   Delbert Harness MD on 12/28/2009   Method used:   [facility]        [facility]  [phone] (retail)       [address] [facility]       [place]  [place], [place]  [address]       Ph: [phone]       Fax: [phone]   RxID:   [id number]   filled out prior authorization.  Needing improved control with LD 195 despite simvastatin 80mg .  Mildy elevated CK.  Will choose crestor. Delbert Harness MD  December 28, 2009 1:50 PM

## 2010-06-19 NOTE — Progress Notes (Signed)
Summary: needs letter   Phone Note Call from Patient Call back at Home Phone [phone]   Caller: Patient Summary of Call: pt needs an apartment that doesn't have stairs and needs a letter to take to landlord. Initial call taken by: De Nurse,  July 19, 2009 10:48 AM  Follow-up for Phone Call        will route you the letter Follow-up by: Asher Muir MD,  July 19, 2009 1:43 PM

## 2010-06-19 NOTE — Miscellaneous (Signed)
Summary: patient summary   Clinical Lists Changes  general comments:  had back surgery (over a year ago)  and has had significant pain since that time.  I don't believe she has worked since then.  is on stable dose of narcotics for her pain.  think there is some secondary gain from her pain,and I suspect her daughter has tried to use  Kristen [name]'s pain to avoid working/community service.   Problems: Removed problem of DEPRESSION (ICD-311) Removed problem of STREPTOCOCCAL PHARYNGITIS (ICD-034.0) Removed problem of SORE THROAT (ICD-462) Removed problem of SHOULDER PAIN, RIGHT (ICD-719.41) Removed problem of DYSMETABOLIC SYNDROME (ICD-277.7) Removed problem of UNSPECIFIED BREAST SCREENING (ICD-V76.10) Removed problem of SCREENING FOR MALIGNANCY NOS (ICD-V76.9) Removed problem of SCREENING FOR LIPOID DISORDERS (ICD-V77.91) Assessed BACK PAIN, LUMBAR as comment only - s/p discectomy/laminectomy 9/09.  has had chronic pain since that time.  see above comments. on neurontin as well.   Her updated medication list for this problem includes:    Norco 5-325 Mg Tabs (Hydrocodone-acetaminophen) .Marland Kitchen... 1 tab by mouth two times a day as needed severe pain not controlled with neurontin/tylenol  Assessed HYPERLIPIDEMIA as comment only -  ldl at goal.  cont simva 80.   Her updated medication list for this problem includes:    Simvastatin 80 Mg Tabs (Simvastatin) .Marland Kitchen... 1 tab by mouth daily for cholesterol  Assessed HYPERTENSION, BENIGN SYSTEMIC as comment only -  good control.   Her updated medication list for this problem includes:    Hydrochlorothiazide 25 Mg Tabs (Hydrochlorothiazide) .Marland Kitchen... Take 1/2  tablet daily for blood pressure control    Amlodipine Besylate 10 Mg Tabs (Amlodipine besylate) .Marland Kitchen... 1 tab by mouth daily for blood pressure  Orders: Comp [facility] ([id number])  Assessed SUPRAPUBIC PAIN as comment only - recently seen for this.  ordered u/s because of history of ovarian cyst.  she  has not had this done yet Her updated medication list for this problem includes:    Norco 5-325 Mg Tabs (Hydrocodone-acetaminophen) .Marland Kitchen... 1 tab by mouth two times a day as needed severe pain not controlled with neurontin/tylenol  Assessed HAND PAIN, LEFT as comment only - likely carpal tunnel.  she had carpal tunnel surgery years ago.  prescribed wrist splints, but she has not tried them yet Assessed GAIT DISTURBANCE as comment only - from her back pain Assessed LEG PAIN, CHRONIC, LEFT as comment only - has had intermittent swelling and pain in this leg since her back surgery.  has been sent for dopplers twice, both times they were negative Observations: Added new observation of MEDRECON: current updated (10/24/2009 12:08) Added new observation of PAST MED HX: history of allergic rhinitis, history of asthma, history of GERD, htn morbid obestiy lumbar stenosis and disc disease s/p surgery 9/09 (10/24/2009 12:08)      Impression & Recommendations:  Problem # 1:  HYPERLIPIDEMIA (ICD-272.4)  ldl at goal.  cont simva 80.   Her updated medication list for this problem includes:    Simvastatin 80 Mg Tabs (Simvastatin) .Marland Kitchen... 1 tab by mouth daily for cholesterol  Problem # 2:  HYPERTENSION, BENIGN SYSTEMIC (ICD-401.1)  good control.   Her updated medication list for this problem includes:    Hydrochlorothiazide 25 Mg Tabs (Hydrochlorothiazide) .Marland Kitchen... Take 1/2  tablet daily for blood pressure control    Amlodipine Besylate 10 Mg Tabs (Amlodipine besylate) .Marland Kitchen... 1 tab by mouth daily for blood pressure  Orders: [facility] ([id number])  Problem # 3:  BACK PAIN, LUMBAR (ICD-724.2) Assessment: Comment Only  s/p discectomy/laminectomy 9/09.  has had chronic pain since that time.  see above comments. on neurontin as well.   Her updated medication list for this problem includes:    Norco 5-325 Mg Tabs (Hydrocodone-acetaminophen) .Marland Kitchen... 1 tab by mouth two times a day as needed severe pain not  controlled with neurontin/tylenol  Problem # 4:  SUPRAPUBIC PAIN (ICD-789.09) Assessment: Comment Only recently seen for this.  ordered u/s because of history of ovarian cyst.  she has not had this done yet Her updated medication list for this problem includes:    Norco 5-325 Mg Tabs (Hydrocodone-acetaminophen) .Marland Kitchen... 1 tab by mouth two times a day as needed severe pain not controlled with neurontin/tylenol  Problem # 5:  HAND PAIN, LEFT (ICD-729.5) Assessment: Comment Only likely carpal tunnel.  she had carpal tunnel surgery years ago.  prescribed wrist splints, but she has not tried them yet  Problem # 6:  GAIT DISTURBANCE (ICD-781.2) Assessment: Comment Only from her back pain  Problem # 7:  LEG PAIN, CHRONIC, LEFT (ICD-729.5) Assessment: Comment Only has had intermittent swelling and pain in this leg since her back surgery.  has been sent for dopplers twice, both times they were negative  Complete Medication List: 1)  Hydrochlorothiazide 25 Mg Tabs (Hydrochlorothiazide) .... Take 1/2  tablet daily for blood pressure control 2)  Neurontin 300 Mg Caps (Gabapentin) .... 2 tablets by mouth in am.  2 tablets by mouth mid-day; 3 tablets by mouth at night 3)  Cetirizine Hcl 10 Mg Tabs (Cetirizine hcl) .Marland Kitchen.. 1 tab by mouth daily for allergies 4)  Norco 5-325 Mg Tabs (Hydrocodone-acetaminophen) .Marland Kitchen.. 1 tab by mouth two times a day as needed severe pain not controlled with neurontin/tylenol 5)  Simvastatin 80 Mg Tabs (Simvastatin) .Marland Kitchen.. 1 tab by mouth daily for cholesterol 6)  Amlodipine Besylate 10 Mg Tabs (Amlodipine besylate) .Marland Kitchen.. 1 tab by mouth daily for blood pressure 7)  Nortriptyline Hcl 25 Mg Caps (Nortriptyline hcl) .Marland Kitchen.. 1 tab by mouth at bedtime for 3 days then 2 tabs by mouth at bedtime for sleep   Past History:  Past Medical History: history of allergic rhinitis, history of asthma, history of GERD, htn morbid obestiy lumbar stenosis and disc disease s/p surgery 9/09   Current  Medications (verified): 1)  Hydrochlorothiazide 25 Mg  Tabs (Hydrochlorothiazide) .... Take 1/2  Tablet Daily For Blood Pressure Control 2)  Neurontin 300 Mg Caps (Gabapentin) .... 2 Tablets By Mouth in Am.  2 Tablets By Mouth Mid-Day; 3 Tablets By Mouth At Night 3)  Cetirizine Hcl 10 Mg Tabs (Cetirizine Hcl) .Marland Kitchen.. 1 Tab By Mouth Daily For Allergies 4)  Norco 5-325 Mg Tabs (Hydrocodone-Acetaminophen) .Marland Kitchen.. 1 Tab By Mouth Two Times A Day As Needed Severe Pain Not Controlled With Neurontin/tylenol 5)  Simvastatin 80 Mg Tabs (Simvastatin) .Marland Kitchen.. 1 Tab By Mouth Daily For Cholesterol 6)  Amlodipine Besylate 10 Mg Tabs (Amlodipine Besylate) .Marland Kitchen.. 1 Tab By Mouth Daily For Blood Pressure 7)  Nortriptyline Hcl 25 Mg Caps (Nortriptyline Hcl) .Marland Kitchen.. 1 Tab By Mouth At Bedtime For 3 Days Then 2 Tabs By Mouth At Bedtime For Sleep  Allergies: 1)  Amoxicillin (Amoxicillin)

## 2010-06-19 NOTE — Miscellaneous (Signed)
Summary: prior auth lipitor   Clinical Lists Changes prior auth faxed to medicaid.Golden Circle RN  December 28, 2009 2:15 PM  Appended Document: prior auth lipitor crestor approved x 1 yr

## 2010-06-19 NOTE — Progress Notes (Signed)
Summary: wi req   Phone Note Call from Patient Call back at [phone]   Caller: daughter-Danielle Summary of Call: pain pills are not working - she cannot sleep and wants something stronger -   Initial call taken by: De Nurse,  April 24, 2010 2:07 PM  Follow-up for Phone Call        pt calling back about pain meds, pt wants wi appt if she needs to be seen to get stronger pain meds. Follow-up by: Knox Royalty,  April 25, 2010 9:29 AM  Additional Follow-up for Phone Call Additional follow up Details #1::        advised patient that I will try to get in touch with Dr. Earnest Bailey to  let her know about pain and will call her back to advise if she needs to be seen again. Additional Follow-up by: Theresia Lo RN,  April 25, 2010 9:59 AM    Additional Follow-up for Phone Call Additional follow up Details #2::     looks like Dr. Earnest Bailey  is post call. . she did not return page.  patient is taking Norco for pain and the neurontin. advised her she will need to be seen for further evaluation regarding pain. appointment scheduled tomorrow AM for work in.  Follow-up by: Theresia Lo RN,  April 25, 2010 10:55 AM   Appended Document: wi req Dr. Earnest Bailey did return page and she will try to talk with Kelli Churn MD for tomorrow that will see patient egarding pain management.

## 2010-06-19 NOTE — Assessment & Plan Note (Signed)
Summary: continued pain buttocks down to legs/ls   Vital Signs:  Patient profile:   50 year old female Height:      63.5 inches Weight:      246 pounds Temp:     98.0 degrees F oral Pulse rate:   99 / minute BP sitting:   110 / 75  (left arm) Cuff size:   large  Vitals Entered By: Tessie Fass CMA (April 26, 2010 8:41 AM) CC: Back Pain Pain Assessment Patient in pain? yes        Primary Care Provider:  Delbert Harness MD  CC:  Back Pain.  History of Present Illness:   Back pain since 2009, for past few months has had multiple visits for pain. Past 3 weeks has had increased burning sensation from back to legs, no falls, no change in bowel or bladder, feels like right foot is turning and feels like she is walking on needles. More specifically feels her right leg is numb compared to the left. Pain starts at buttocks and radiates down to feet.  Reivewed PMH- noted back surgery Has had physical therapy in the past- states has not helped Takeing neurontin twice a day Norco twice a day-- states these are not helping now Able to ambulate occ with assistance, pain occurs with various movements and after sitting long periods of time. Tries to [employer] but this does not help very much  Not exercising secondary to pain, unable to get a visit with nutritionist  Current Medications (verified): 1)  Hydrochlorothiazide 25 Mg  Tabs (Hydrochlorothiazide) .... Take 1/2  Tablet Daily For Blood Pressure Control 2)  Neurontin 300 Mg Caps (Gabapentin) .... Slowly Increase To 2 Tabs Three Times A Day 3)  Cetirizine Hcl 10 Mg Tabs (Cetirizine Hcl) .Marland Kitchen.. 1 Tab By Mouth Daily For Allergies 4)  Norco 5-325 Mg Tabs (Hydrocodone-Acetaminophen) .Marland Kitchen.. 1 Tab By Mouth Two Times A Day As Needed Severe Pain Not Controlled With Neurontin/tylenol 5)  Crestor 20 Mg Tabs (Rosuvastatin Calcium) .... One Half Tablet Daily For 2 Weeks, Then 1 Tablet Daily 6)  Amlodipine Besylate 10 Mg Tabs (Amlodipine Besylate) .Marland Kitchen..  1 Tab By Mouth Daily For Blood Pressure 7)  Nortriptyline Hcl 25 Mg Caps (Nortriptyline Hcl) .Marland Kitchen.. 1 Tab By Mouth At Bedtime As Needed For Sleep 8)  Cymbalta 60 Mg Cpep (Duloxetine Hcl)  Allergies: 1)  Amoxicillin (Amoxicillin)  Past History:  Past Surgical History: Last updated: 09/20/2009 Bilat carpel tunnel release 1988 - 01/05/2004, R ovarian mass removed 1995 - 01/05/2004, surgery for ruptured disc 9/09 (discectomy, laminectomy)  Past Medical History: history of allergic rhinitis, history of asthma, history of GERD, htn morbid obestiy lumbar stenosis and disc disease s/p surgery 9/09 (laminectomy/diskectomy) Seen PMR in past    Review of Systems       Per HPI  Physical Exam  General:  Obese, well groomed, pleasant.  Vital signs noted  Msk:  Spine non tender No paraspinal spasm noted  TTP bilateral buttocks and posterior thigh discomoft with external rotation of bilat hips, normal internal rotation normal Hip rock,  able to flex at hip with some radiaculr pain in buttock Pulses:  DP, radial post tibialsis 2+, equal bilat Extremities:  no cyanosis or change in color Neurologic:  Decreased sensation in saddle region on right side, normal on left, decreased sensation on right leg and foot, pt unable to feel monofilament on right foot slightly decreased compared to upper ext with monofilament on left foot sensation left leg  intact motor equal bilat but poor effort  DTR symmetic in ankles, no clonus, decreased patellar on right side 1+, left 2+ alert & oriented X3 and cranial nerves II-XII intact.   Gait antaglic getting up from seated position   Impression & Recommendations:  Problem # 1:  BACK PAIN, LUMBAR, WITH RADICULOPATHY (ICD-724.4) Assessment Deteriorated Pt with history of back surgery, spinal stenosis and herniated disc, reviewed MRI from 2009 prior to intervention. Symptoms continue to worsen despite medical treatment with neurotin and opiates. There is a  question of secondary gain as noted in previous notes, however with exam today espc with sensation having a very subjective componenet difficult to assess if pt condition has deteriorated that much. Will send for repeat MRI of back to assess, likley sciatic with previous stenosis causing discomoft. Discussed with PCP, will send to neurology and previously suggested. Pt given instructions for use of neurontin, also given an early refill on pain medication, noted to patient this will not be the case with PCP , she voiced understanding.  Her updated medication list for this problem includes:    Norco 5-325 Mg Tabs (Hydrocodone-acetaminophen) .Marland Kitchen... 1 tab by mouth two times a day as needed severe pain not controlled with neurontin/tylenol    Norco 5-325 Mg Tabs (Hydrocodone-acetaminophen) .Marland Kitchen... 1 by mouth q 6hrs as needed pain  Orders: MRI without Contrast (MRI w/o Contrast) [facility]- Est  Level 4 ([address]010) Neurology Referral (Neuro)  Complete Medication List: 1)  Hydrochlorothiazide 25 Mg Tabs (Hydrochlorothiazide) .... Take 1/2  tablet daily for blood pressure control 2)  Neurontin 300 Mg Caps (Gabapentin) .... Slowly increase to 2 tabs three times a day 3)  Cetirizine Hcl 10 Mg Tabs (Cetirizine hcl) .Marland Kitchen.. 1 tab by mouth daily for allergies 4)  Norco 5-325 Mg Tabs (Hydrocodone-acetaminophen) .Marland Kitchen.. 1 tab by mouth two times a day as needed severe pain not controlled with neurontin/tylenol 5)  Crestor 20 Mg Tabs (Rosuvastatin calcium) .... One half tablet daily for 2 weeks, then 1 tablet daily 6)  Amlodipine Besylate 10 Mg Tabs (Amlodipine besylate) .Marland Kitchen.. 1 tab by mouth daily for blood pressure 7)  Nortriptyline Hcl 25 Mg Caps (Nortriptyline hcl) .Marland Kitchen.. 1 tab by mouth at bedtime as needed for sleep 8)  Cymbalta 60 Mg Cpep (Duloxetine hcl) 9)  Norco 5-325 Mg Tabs (Hydrocodone-acetaminophen) .Marland Kitchen.. 1 by mouth q 6hrs as needed pain  Patient Instructions: 1)  Start taking the neurontin 2 tabs by mouth three times a  day 2)  I have given you an extra refill on your pain medication, you can take 1 tab every 6 hours 3)  We will set up a neurology referral  4)  I will call you about your imaging. 5)  Follow-up with Dr. Earnest Bailey in 1 month  Prescriptions: NORCO 5-325 MG TABS (HYDROCODONE-ACETAMINOPHEN) 1 by mouth q 6hrs as needed pain  #60 x 0   Entered and Authorized by:   Milinda Antis MD   Signed by:   Milinda Antis MD on 04/26/2010   Method used:   Handwritten   RxID:   [id number]    Orders Added: 1)  MRI without Contrast [MRI w/o Contrast] 2)  [facility]- Est  Level 4 [[address]] 3)  Neurology Referral [Neuro]

## 2010-06-19 NOTE — Progress Notes (Signed)
Summary: needs another letter   Phone Note Call from Patient Call back at Work Phone [phone]   Caller: Patient Summary of Call: needs a letter to be more specific stating that her daughter needs her in the home form December- the end of February needs to take to the judge to get her daughter out of jail.  Marlan Palau  Initial call taken by: De Nurse,  June 28, 2009 10:30 AM  Follow-up for Phone Call        revised letter written, signed, placed in to be called box. Follow-up by: Asher Muir MD,  June 28, 2009 1:44 PM

## 2010-06-19 NOTE — Progress Notes (Signed)
Summary: phn msg   Phone Note From Other Clinic Call back at [phone]   Caller: Cami- DSS Summary of Call: needs to talk to Dr Lafonda Mosses about this case - wanted to make sure that daughter was not able to work at all during the month of January due to taking care of her mother.  pls call today Initial call taken by: De Nurse,  July 04, 2009 9:02 AM  Follow-up for Phone Call        verified to Cami that [name] has been helping her mother, [name] ([name] has signed a release of information).  I also stated that I do not anticipate that [name] will need help indefititely Follow-up by: Asher Muir MD,  July 04, 2009 1:50 PM

## 2010-06-19 NOTE — Progress Notes (Signed)
Summary: phn msg   Phone Note Call from Patient Call back at [phone]   Caller: Patient Summary of Call: pt is returning call to talk w/ Lafonda Mosses -  pls call after noon Initial call taken by: De Nurse,  November 06, 2009 8:41 AM  Follow-up for Phone Call        left mssg with family member for pt to call me back Follow-up by: Asher Muir MD,  November 06, 2009 10:01 AM  Additional Follow-up for Phone Call Additional follow up Details #1::        pt called back.  discussed u/s results which showed fibroids.  not certain that fibroids are the cause of her pain.  will schedule visit with me next week.  plan for pelvic exam, with STD screening and get u/a and culture at that time.  pt agrees with plan Additional Follow-up by: Asher Muir MD,  November 06, 2009 10:19 AM

## 2010-06-19 NOTE — Miscellaneous (Signed)
Summary: medical record request  Clinical Lists Changes  Rec'd medical record request to be mailed to [facility] mailed 02/01/10 Marily Memos  April 18, 2010 10:03 AM

## 2010-06-19 NOTE — Letter (Signed)
Summary: Generic Letter  [facility]  [address]   [place], [place] [address]   Phone: [phone]  Fax: [phone]    06/01/2009  [facility] [address] [place], [place]  [address]  Dear Ms. Griego,    I just wanted to let you know that your cholesterol level is normal.  I don't think we need to change your cholesterol medicine.  However, it would be good to try to cut back on saturated fats--red meats, butter, margarine, shortening, etc.  Please call me if you have any questions or concerns.          Sincerely,   Asher Muir MD  Appended Document: Generic Letter mailed
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
## 2010-06-19 NOTE — Letter (Signed)
Summary: Generic Letter  [facility]  [address]   [place], [place] [address]   Phone: [phone]  Fax: [phone]    09/21/2009  [facility] [address] [place], [place]  [address]  Dear Ms. Lafortune,    I just wanted to let you know that your lab results were normal.  Please call me if you have any questions or concerns.          Sincerely,   Asher Muir MD  Appended Document: Generic Letter mailed.

## 2010-06-19 NOTE — Assessment & Plan Note (Signed)
Summary: F/U/KH   Vital Signs:  Patient profile:   50 year old female Height:      63.5 inches Weight:      250 pounds BMI:     43.75 Pulse rate:   82 / minute BP sitting:   142 / 80  (right arm) Cuff size:   large  Vitals Entered By: Arlyss Repress CMA, (February 13, 2010 10:58 AM) CC: discuss Cymbalta. feels some better. right arm numbess off and on x 3 weeks. Is Patient Diabetic? No Pain Assessment Patient in pain? yes     Location: body Intensity: 8 Onset of pain  Chronic   Primary Care Kristen [name]:  Kristen Harness MD  CC:  discuss Cymbalta. feels some better. right arm numbess off and on x 3 weeks.Marland Kitchen  History of Present Illness: 50 yo here for follow-up for chronci pain  states cymbalta has made some improvement.  continues to have right arm parasthesias, numbness, tingling.  associated with enck pain.  Weakness in both arms bilaterally.  Asks about getting diagonsed for lupus.  Habits & Providers  Alcohol-Tobacco-Diet     Tobacco Status: quit > 6 months     Tobacco Counseling: not to resume use of tobacco products  Current Medications (verified): 1)  Hydrochlorothiazide 25 Mg  Tabs (Hydrochlorothiazide) .... Take 1/2  Tablet Daily For Blood Pressure Control 2)  Neurontin 300 Mg Caps (Gabapentin) .... Take 1 Tablet At [place], Lunch and 2 Tabs Before Bedtime 3)  Cetirizine Hcl 10 Mg Tabs (Cetirizine Hcl) .Marland Kitchen.. 1 Tab By Mouth Daily For Allergies 4)  Norco 5-325 Mg Tabs (Hydrocodone-Acetaminophen) .Marland Kitchen.. 1 Tab By Mouth Two Times A Day As Needed Severe Pain Not Controlled With Neurontin/tylenol 5)  Crestor 20 Mg Tabs (Rosuvastatin Calcium) .... One Half Tablet Daily For 2 Weeks, Then 1 Tablet Daily 6)  Amlodipine Besylate 10 Mg Tabs (Amlodipine Besylate) .Marland Kitchen.. 1 Tab By Mouth Daily For Blood Pressure 7)  Nortriptyline Hcl 25 Mg Caps (Nortriptyline Hcl) .Marland Kitchen.. 1 Tab By Mouth At Bedtime As Needed For Sleep 8)  Cymbalta 30 Mg Cpep (Duloxetine Hcl) .... Take One Tablet Daily For 1  Week, Then Two Tablets Daily  Allergies: 1)  Amoxicillin (Amoxicillin)  Social History: Smoking Status:  quit > 6 months  Physical Exam  General:  Obese, well groomed, pleasant.  Has a cane. Neck:  tender to palpation cervical paraspinal muscles.  NO ttp over vertebrae.  Pain but no readiculopathy elicited when flexion fwd, back, lateral, and extension.  Also pain noted in neck with turning left and right.   Neurologic:  sensation to light touch over bilteral upper extremities intact.  Patient as giving way bilaterally when testig for upper extremity weakness.    Impression & Recommendations:  Problem # 1:  ARM PAIN, RIGHT (ICD-729.5) I beleive this is another manifestation of her chronic pain.  Given she states she has weakness and parasthesias, will check xray to evaluate for impingement.  If continues to have weaknes,  will consider EMG as next step to evaluate right arm weakness and prasthesia.  Orders: Diagnostic X-Ray/Fluoroscopy (Diagnostic X-Ray/Flu) [facility]- Est Level  3 (08657)  Problem # 2:  MYALGIA (ICD-729.1)  Increase gabapenting, continue cymbalta. Discussed again staying active, weight loss.  Her updated medication list for this problem includes:    Norco 5-325 Mg Tabs (Hydrocodone-acetaminophen) .Marland Kitchen... 1 tab by mouth two times a day as needed severe pain not controlled with neurontin/tylenol  Orders: [facility]- Est Level  3 (84696)  Complete Medication  List: 1)  Hydrochlorothiazide 25 Mg Tabs (Hydrochlorothiazide) .... Take 1/2  tablet daily for blood pressure control 2)  Neurontin 300 Mg Caps (Gabapentin) .... Take 1 tablet at breakfats, lunch and 2 tabs before bedtime 3)  Cetirizine Hcl 10 Mg Tabs (Cetirizine hcl) .Marland Kitchen.. 1 tab by mouth daily for allergies 4)  Norco 5-325 Mg Tabs (Hydrocodone-acetaminophen) .Marland Kitchen.. 1 tab by mouth two times a day as needed severe pain not controlled with neurontin/tylenol 5)  Crestor 20 Mg Tabs (Rosuvastatin calcium) .... One half tablet daily  for 2 weeks, then 1 tablet daily 6)  Amlodipine Besylate 10 Mg Tabs (Amlodipine besylate) .Marland Kitchen.. 1 tab by mouth daily for blood pressure 7)  Nortriptyline Hcl 25 Mg Caps (Nortriptyline hcl) .Marland Kitchen.. 1 tab by mouth at bedtime as needed for sleep 8)  Cymbalta 60 Mg Cpep (Duloxetine hcl)  Patient Instructions: 1)  increase your gabapentin up to 4 tablets a day.  Try to workup to one tablet in the am, one tablet at lunch, and 2 at bedtime. 2)  I will call you with xray results. 3)  The best thing you can do your your pain is to stay active and lose weight.  Think about how you can get daily exercise and I will ask you about this at your next visit. 4)  Follow-up in 2-3 months or sooner if needed.

## 2010-06-19 NOTE — Consult Note (Signed)
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

## 2010-06-19 NOTE — Progress Notes (Signed)
Summary: Rx Prob   Phone Note Call from Patient Call back at [phone] or [phone]   Caller: Patient Summary of Call: Pt says she was to have some antibotics sent in to the pharmacy but when she went there they were not there.  Pharmacy was to be [facility] [address] Initial call taken by: Clydell Hakim,  January 02, 2010 2:46 PM  Follow-up for Phone Call        patient has not been seen since July 29th and no plan for antibiotics at that time.  Please let patient know she that she may have misunderstood and the recent change was that her cholesterol medication was changed from simvastatin to crestor which she should have picked up. Follow-up by: Delbert Harness MD,  January 03, 2010 10:22 AM  Additional Follow-up for Phone Call Additional follow up Details #1::        Spoke with pt. about her antibiotic rx. she st5ates that she had no money at the time to pick up. Rx was prescribed on 6/29. she has an appointment on 8/30 in office Additional Follow-up by: Jimmy Footman, CMA,  January 03, 2010 10:33 AM     Appended Document: Rx Prob Patient was already sent in flagyl twice. see below.  Please let patient know this and if I need to send it in again, please let me know.  METRONIDAZOLE 500 MG TABS (METRONIDAZOLE) 1 tab by mouth two times a day for infection for 7 days  #14 x 0   Entered by:   Theresia Lo RN   Authorized by:   Asher Muir MD   Signed by:   Theresia Lo RN on 11/16/2009   Method used:   Electronically to        [facility] [phone]* (retail)       [address]       [place], [place]  [address]       Ph: [phone]       Fax: [phone]   RxID:   [id number]   rx that was sent to [facility] was cancelled ( patient has not picked up yet ) and sent to [facility] as requested by patient. Theresia Lo RN  November 16, 2009 3:53 PM

## 2010-06-19 NOTE — Letter (Signed)
Summary: DSS Form  DSS Form   Imported By: Knox Royalty 07/21/2009 09:16:34  _____________________________________________________________________  External Attachment:    Type:   Image     Comment:   External Document

## 2010-06-19 NOTE — Letter (Signed)
Summary: Generic Letter  [facility]  [address]   [place], [place] [address]   Phone: [phone]  Fax: [phone]    10/26/2009  [facility] [address] [place], [place]  [address]  Dear Ms. Connell,  I tried to reach you by phone, but we do not have a record of your correct phone number.  Your ultrasound showed that you have fibroids in your uterus (fibroids are usually NOT cancerous).  Please call me so that we can talk about these results.  I look forward to talking with you. I will be on vacation June 11-June 19, but will return on the 20th and we can talk then.           Sincerely,   Asher Muir MD  Appended Document: Generic Letter mailed.

## 2010-06-19 NOTE — Miscellaneous (Signed)
Summary: Social Services Form  Daughter dropped off paper to be filled out for social services.  She says it needs to be turned in today at 5:00pm.  Please call her when completed. Bradly Bienenstock  July 17, 2009 12:11 PM  form to pcp.Golden Circle RN  July 17, 2009 12:32 PM  completed

## 2010-06-19 NOTE — Assessment & Plan Note (Signed)
Summary: depression, chronic pain,tcb   Vital Signs:  Patient profile:   50 year old female Height:      63.5 inches Weight:      246 pounds BMI:     43.05 Temp:     98.3 degrees F oral Pulse rate:   96 / minute BP sitting:   140 / 92  (left arm) Cuff size:   large  Vitals Entered By: Jimmy Footman, CMA (December 15, 2009 2:14 PM) CC: evaluate depression and chronic leg & back pain Is Patient Diabetic? No Pain Assessment Patient in pain? yes     Location: legs and back Intensity: 10+   Primary Care Provider:  Delbert Harness MD  CC:  evaluate depression and chronic leg & back pain.  History of Present Illness: 50 yo called for work-in appt to discuss depression and leg pain both of which are chronic.  Patient brings paperwork from diability attorney to fill out.  Patient also wants to discuss diagnosis of fibromyalgia as she feels she may have this condition.  Chronic pain:  Continues chronic pain- unchnged from previous.  Patient had consultation with pain clinic in the past.  States she did not return as they recommends shots to control back pain.  Takes norco 1 ta 3-4 times per day which controled pain.  Also on neurontin 1 tab TID  Depression: although work in appt was made for depression, patient states is is chronic and no worse than previous.  Is not particularly interested in talking about treatment  but says she would be open to new medication.  Says she did see a therapist once but is not inteested in seeking another provider.  uses nortriptiline for sleep.  Habits & Providers  Alcohol-Tobacco-Diet     Tobacco Status: quit     Tobacco Counseling: to quit use of tobacco products     Year Quit: 1989     Diet Comments: low salt  Current Medications (verified): 1)  Hydrochlorothiazide 25 Mg  Tabs (Hydrochlorothiazide) .... Take 1/2  Tablet Daily For Blood Pressure Control 2)  Neurontin 300 Mg Caps (Gabapentin) .... Take 1 Tablet Three Times A Day 3)  Cetirizine Hcl 10 Mg  Tabs (Cetirizine Hcl) .Marland Kitchen.. 1 Tab By Mouth Daily For Allergies 4)  Norco 5-325 Mg Tabs (Hydrocodone-Acetaminophen) .Marland Kitchen.. 1 Tab By Mouth Two Times A Day As Needed Severe Pain Not Controlled With Neurontin/tylenol 5)  Simvastatin 80 Mg Tabs (Simvastatin) .Marland Kitchen.. 1 Tab By Mouth Daily For Cholesterol 6)  Amlodipine Besylate 10 Mg Tabs (Amlodipine Besylate) .Marland Kitchen.. 1 Tab By Mouth Daily For Blood Pressure 7)  Nortriptyline Hcl 25 Mg Caps (Nortriptyline Hcl) .Marland Kitchen.. 1 Tab By Mouth At Bedtime As Needed For Sleep  Allergies: 1)  Amoxicillin (Amoxicillin) PMH-FH-SH reviewed-no changes except otherwise noted  Review of Systems      See HPI  Physical Exam  General:  Obese, well groomed, pleasant.  alert, appropriate dress, and good hygiene.   Lungs:  Normal respiratory effort, chest expands symmetrically. Lungs are clear to auscultation, no crackles or wheezes. Heart:  Normal rate and regular rhythm. S1 and S2 normal without gallop, murmur, click, rub or other extra sounds. Msk:  patient states she is tender on light palpation bilaterally to deltoids, all areas of back muscles, thighs, calves. Extremities:  no significant LE swelling   Impression & Recommendations:  Problem # 1:  MYALGIA (ICD-729.1) Complains of myalgias, chronic.  Patient seeking dx of fibromyalgia.  Will check labs to rule out organic  cause of myalgias.  Will discussfurther after labwork at next visit.  Told patient primary tx of fibromyalgia is exercise and that i would like her to increase physical acitivty.  Her updated medication list for this problem includes:    Norco 5-325 Mg Tabs (Hydrocodone-acetaminophen) .Marland Kitchen... 1 tab by mouth two times a day as needed severe pain not controlled with neurontin/tylenol  Orders: TSH-[facility] (04540-98119) CK (Creatine Kinase)-[facility] (82550-23250) Sed Rate (ESR)-[facility] (85651) [facility]- Est  Level 4 (14782)  Problem # 2:  BACK PAIN, LUMBAR (ICD-724.2)  multiple sources of pain from myalgias to back pain.   Reviewed records of previous PCP- has been on stable dose of Norco.  patient seeking disability for back pain.  Declined filling out form today as this is my first time meeting patient and no significant changes have occured since last provider filled out form in Oct 2010.  gave patient copy of previous form to submit.   Asked her to follow-up with pain clinic.  Her updated medication list for this problem includes:    Norco 5-325 Mg Tabs (Hydrocodone-acetaminophen) .Marland Kitchen... 1 tab by mouth two times a day as needed severe pain not controlled with neurontin/tylenol  Orders: [facility]- Est  Level 4 (95621)  Problem # 3:  ANXIETY DEPRESSION (ICD-300.4) Reviewed old notes- depression taken off problem list 10/2009 as resolved.  Thought to be situational.  Patient tried on effexor, celexa, amytriptyline in past.  Not currenlty on anything.  No SI, HI.  PHQ-9 27 (max points except for 0 on suicidal) GAD-7: 21 (max points) MDQ: 3 yes.   Will start cymbalta today for depression as well as myalgias, chronic pain.  Complete Medication List: 1)  Hydrochlorothiazide 25 Mg Tabs (Hydrochlorothiazide) .... Take 1/2  tablet daily for blood pressure control 2)  Neurontin 300 Mg Caps (Gabapentin) .... Take 1 tablet three times a day 3)  Cetirizine Hcl 10 Mg Tabs (Cetirizine hcl) .Marland Kitchen.. 1 tab by mouth daily for allergies 4)  Norco 5-325 Mg Tabs (Hydrocodone-acetaminophen) .Marland Kitchen.. 1 tab by mouth two times a day as needed severe pain not controlled with neurontin/tylenol 5)  Simvastatin 80 Mg Tabs (Simvastatin) .Marland Kitchen.. 1 tab by mouth daily for cholesterol 6)  Amlodipine Besylate 10 Mg Tabs (Amlodipine besylate) .Marland Kitchen.. 1 tab by mouth daily for blood pressure 7)  Nortriptyline Hcl 25 Mg Caps (Nortriptyline hcl) .Marland Kitchen.. 1 tab by mouth at bedtime as needed for sleep  Other Orders: Comp Met-[facility] ([id number]) Direct LDL-[facility] (62952-84132) CBC-[facility] (44010)  Patient Instructions: 1)  Will get labs today.  I will call you if any  abnormality, will review with you at next visit if ok. 2)  Exercise is the most important thing you can do to help control pain. 3)  I would like for you to follow-up with pain clinic 4)  Will start new medicine called cymbalta- expecte 4-6 weeks to see come change 5)  Will monitor simvastatin for evidence of muscle damage.  Will discuss alternative therapies at next visit 6)  Follow-up in 4-6 weeks or sooner if needed. Prescriptions: NEURONTIN 300 MG CAPS (GABAPENTIN) take 1 tablet three times a day  #90 x 0   Entered and Authorized by:   Delbert Harness MD   Signed by:   Terese Door on 12/15/2009   Method used:   [facility]  [address] [phone][facility] (retail)       [address] [facility]       [place]  [place], [place]  [address]       Ph: [phone]       Fax: [phone]   RxID:   [id number]   Laboratory Results   Blood Tests   Date/Time Received: December 15, 2009 3:21 PM  Date/Time Reported: December 15, 2009 4:52 PM   SED rate: 24 mm/hr  Comments: ...........test performed by...........Marland KitchenTerese Door, CMA

## 2010-06-19 NOTE — Progress Notes (Signed)
Summary: phn msg   Phone Note Outgoing Call Call back at [phone]   Caller: Patient Summary of Call: Pt feels like she needs a referral to Mental Health for depression.   Initial call taken by: Clydell Hakim,  December 11, 2009 10:24 AM Call placed by: Jimmy Footman, CMA,  December 11, 2009 10:52 AM Call placed to: Patient Summary of Call: Spoke with pt. and advised her to call back to schedule an appt so she can be evaluated for getting a referral to mental health. pt understand and will call to schedule appt. with PCP  Follow-up for Phone Call        Pt made appt for 12/15/09. Follow-up by: Clydell Hakim,  December 11, 2009 10:54 AM

## 2010-06-19 NOTE — Progress Notes (Signed)
Summary: Rx Prob   Phone Note Call from Patient Call back at Home Phone [phone]   Caller: Patient Summary of Call: The medication that was perscribed to her on her last visit is not at the correct pharmacy it should be sent to the [facility] [address] Initial call taken by: Clydell Hakim,  June 12, 2009 10:54 AM  Follow-up for Phone Call        [facility] [address] advised patient to call them and ask them to tranfer to [facility] on [address]. Follow-up by: Theresia Lo RN,  June 12, 2009 3:15 PM

## 2010-06-19 NOTE — Letter (Signed)
Summary: Generic Letter  [facility]  [address]   [place], [place] [address]   Phone: [phone]  Fax: [phone]    06/28/2009  Lessie Janelle [address] [place], [place]  [address]  To whom it may concern  Kathaleen Grinder has been helping to care for her mother Anquanette Bahner) in the home.  Ms. Mencer currently needs some assistance with ADLs and transportation.  Duwayne Heck has been helping with Ms. Heslop's care since December.  I anticipate that she will be needed to help in the care for her mother until her mother's next appointment in approximately 3 weeks.  Please call our office if you have any questions.         Sincerely,   Asher Muir MD

## 2010-06-19 NOTE — Letter (Signed)
Summary: Generic Letter  [facility]  [address]   [place], [place] [address]   Phone: [phone]  Fax: [phone]    06/27/2009  Sherrey Jasmer [address] [place], [place]  [address]  To whom it may concern:  Kathaleen Grinder is helping to care for her mother Bannie Lobban).  She will be needed to help in the care for her mother until her mother's next appointment in approximately 3 weeks.  Please call our office if you have any questions.         Sincerely,   Asher Muir MD  Appended Document: Generic Letter Pt notified letter was ready to be picked up.

## 2010-06-19 NOTE — Letter (Signed)
Summary: Generic Letter  [facility]  [address]   [place], [place] [address]   Phone: [phone]  Fax: [phone]    11/15/2009  [facility] [address] [place], [place]  [address]  Dear Ms. Davlin,   I just wanted to let you know that your labs were normal except that you have bacterial vaginosis.  This is not a serious condition, but we do treat it with antibiotics, which I have sent to your pharmacy.  I do not think that this is causing your pain.  I made a referral to gynecology for evaluation of your pain.  If you do not hear from our office with an appointment in one week, please call us to follow up.  Go pick up and take your antibiotics for the bacterial vaginosis.  I tried to contact you by phone, but you were not in.  Call our office if you have any questions.        Sincerely,   Asher Muir MD  Appended Document: Generic Letter mailed

## 2010-06-19 NOTE — Assessment & Plan Note (Signed)
Summary: Kristen [name]   Vital Signs:  Patient profile:   50 year old female Height:      63.5 inches Weight:      248.31 pounds BMI:     43.45 Temp:     98.5 degrees F oral Pulse rate:   89 / minute BP sitting:   121 / 78  (left arm)  Vitals Entered By: Terese Door (October 23, 2009 2:31 PM) CC: carpal tunnel, abd pain Is Patient Diabetic? No Pain Assessment Patient in pain? yes     Location: all over Intensity: 6 Type: dull/aching   Primary Care Provider:  Asher Muir MD  CC:  carpal tunnel and abd pain.  History of Present Illness: 1.  carpal tunnel--did not get wrist splints.  had them before, but they did not help.  had carpal tunnel surgery in the late 80s or early 90s at the [facility] (not sure of surgeon).    2.  bilateral lower abd pain--past 3 weeks.  no fevers, no back pain, no discharge/bleeding. no dysuria.  no incontinence.  no precipitating factors.  intermittent.  the pain pills make it resolve.  no other relieving factors.  feels like a stabbing pain.  says she has hx of 10 cm ovarian cyst on one ovary in past.  she does not remember when  Habits & Providers  Alcohol-Tobacco-Diet     Tobacco Status: quit > 6 months  Current Medications (verified): 1)  Hydrochlorothiazide 25 Mg  Tabs (Hydrochlorothiazide) .... Take 1/2  Tablet Daily For Blood Pressure Control 2)  Neurontin 300 Mg Caps (Gabapentin) .... 2 Tablets By Mouth in Am.  2 Tablets By Mouth Mid-Day; 3 Tablets By Mouth At Night 3)  Cetirizine Hcl 10 Mg Tabs (Cetirizine Hcl) .Marland Kitchen.. 1 Tab By Mouth Daily For Allergies 4)  Norco 5-325 Mg Tabs (Hydrocodone-Acetaminophen) .Marland Kitchen.. 1 Tab By Mouth Two Times A Day As Needed Severe Pain Not Controlled With Neurontin/tylenol 5)  Simvastatin 80 Mg Tabs (Simvastatin) .Marland Kitchen.. 1 Tab By Mouth Daily For Cholesterol 6)  Amlodipine Besylate 10 Mg Tabs (Amlodipine Besylate) .Marland Kitchen.. 1 Tab By Mouth Daily For Blood Pressure 7)  Nortriptyline Hcl 25 Mg Caps (Nortriptyline Hcl) .Marland Kitchen.. 1 Tab  By Mouth At Bedtime For 3 Days Then 2 Tabs By Mouth At Bedtime For Sleep  Allergies: 1)  Amoxicillin (Amoxicillin)  Social History: Smoking Status:  quit > 6 months  Physical Exam  General:  Obese, well groomed, pleasant.   Abdomen:  moderate ttp over lower abdomen.  soft, normal bowel sounds, no distention, no masses, and no guarding.   Additional Exam:  vital signs reviewed    Impression & Recommendations:  Problem # 1:  SUPRAPUBIC PAIN (ICD-789.09) Assessment New  will get follow up ultrasound to follow up ovarian cyst.   Her updated medication list for this problem includes:    Norco 5-325 Mg Tabs (Hydrocodone-acetaminophen) .Marland Kitchen... 1 tab by mouth two times a day as needed severe pain not controlled with neurontin/tylenol  Orders: Ultrasound (Ultrasound) [facility]- Est Level  3 (19147)  Problem # 2:  HAND PAIN, LEFT (ICD-729.5) Assessment: Unchanged  still think wrist splints are the first step.  re-wrote script  Orders: [facility]- Est Level  3 (82956)  Problem # 3:  Preventive Health Care (ICD-V70.0) due for mammo  Complete Medication List: 1)  Hydrochlorothiazide 25 Mg Tabs (Hydrochlorothiazide) .... Take 1/2  tablet daily for blood pressure control 2)  Neurontin 300 Mg Caps (Gabapentin) .... 2 tablets by mouth in  am.  2 tablets by mouth mid-day; 3 tablets by mouth at night 3)  Cetirizine Hcl 10 Mg Tabs (Cetirizine hcl) .Marland Kitchen.. 1 tab by mouth daily for allergies 4)  Norco 5-325 Mg Tabs (Hydrocodone-acetaminophen) .Marland Kitchen.. 1 tab by mouth two times a day as needed severe pain not controlled with neurontin/tylenol 5)  Simvastatin 80 Mg Tabs (Simvastatin) .Marland Kitchen.. 1 tab by mouth daily for cholesterol 6)  Amlodipine Besylate 10 Mg Tabs (Amlodipine besylate) .Marland Kitchen.. 1 tab by mouth daily for blood pressure 7)  Nortriptyline Hcl 25 Mg Caps (Nortriptyline hcl) .Marland Kitchen.. 1 tab by mouth at bedtime for 3 days then 2 tabs by mouth at bedtime for sleep  Patient Instructions: 1)  It was nice to see you  today. 2)  I will call you with ultrasound results. 3)  Make your mammogram appointment. 4)  Make an appointment for a physical at your convenience 5)  Try wrist splints at night and when you are using your hands.  6)  Blood pressure is great.   7)  Please schedule a follow-up appointment for wrists and abd pain in one month.    Prevention & Chronic Care Immunizations   Influenza vaccine: Fluvax Non-MCR  (02/21/2009)   Influenza vaccine due: 06/11/2008    Tetanus booster: 12/23/2003: Done.   Tetanus booster due: 12/22/2013    Pneumococcal vaccine: Not documented  Other Screening   Pap smear: NEGATIVE FOR INTRAEPITHELIAL LESIONS OR MALIGNANCY.  (07/11/2008)   Pap smear due: 07/03/2011    Mammogram: Normal  (07/13/2008)   Mammogram action/deferral: Ordered  (09/20/2009)   Mammogram due: 07/2009   Smoking status: quit > 6 months  (10/23/2009)  Lipids   Total Cholesterol: 227  (07/13/2008)   Lipid panel action/deferral: LDL Direct Ordered   LDL: 045  (07/13/2008)   LDL Direct: 119  (05/31/2009)   HDL: 38  (07/13/2008)   Triglycerides: 114  (07/13/2008)   Lipid panel due: 04/28/2009    SGOT (AST): 14  (09/20/2009)   SGPT (ALT): 11  (09/20/2009)   Alkaline phosphatase: 110  (09/20/2009)   Total bilirubin: 0.3  (09/20/2009)    Lipid flowsheet reviewed?: Yes   Progress toward LDL goal: At goal  Hypertension   Last Blood Pressure: 121 / 78  (10/23/2009)   Serum creatinine: 0.96  (09/20/2009)   Serum potassium 4.3  (09/20/2009)    Hypertension flowsheet reviewed?: Yes   Progress toward BP goal: At goal  Self-Management Support :   Personal Goals (by the next clinic visit) :      Personal blood pressure goal: 140/90  (01/18/2009)     Personal LDL goal: 130  (01/27/2009)    Hypertension self-management support: Written self-care plan  (01/18/2009)    Hypertension self-management support not done because: Good outcomes  (07/10/2009)    Lipid self-management support:  Lipid monitoring log, Written self-care plan, Education handout, Pre-printed educational material, Referred for self-management class  (05/31/2009)     Lipid self-management support not done because: Good outcomes  (07/10/2009)

## 2010-06-19 NOTE — Assessment & Plan Note (Signed)
Summary: pelvic exam,tcb   Vital Signs:  Patient profile:   50 year old female Height:      63.5 inches Weight:      247.2 pounds BMI:     43.26 Temp:     98.2 degrees F oral Pulse rate:   82 / minute BP sitting:   119 / 81  (left arm) Cuff size:   large  Vitals Entered By: Gladstone Pih (November 14, 2009 2:56 PM) CC: C/O issues with pain in ovary area, US done 3 weeks ago Is Patient Diabetic? No Pain Assessment Patient in pain? no        Primary Care Provider:  Asher Muir MD  CC:  C/O issues with pain in ovary area and US done 3 weeks ago.  History of Present Illness: here for f/u for lower abd pain.  had ultrasound, which showed fibroids, but no ovarian pathology.  Pt continues to have pain and it is worsening.  see hpi from last visit for more details.  our plan today is to rule out PID, as pt is sexually active.  Habits & Providers  Alcohol-Tobacco-Diet     Tobacco Status: quit     Tobacco Counseling: to quit use of tobacco products     Year Quit: 1989  Current Medications (verified): 1)  Hydrochlorothiazide 25 Mg  Tabs (Hydrochlorothiazide) .... Take 1/2  Tablet Daily For Blood Pressure Control 2)  Neurontin 300 Mg Caps (Gabapentin) .... 2 Tablets By Mouth in Am.  2 Tablets By Mouth Mid-Day; 3 Tablets By Mouth At Night 3)  Cetirizine Hcl 10 Mg Tabs (Cetirizine Hcl) .Marland Kitchen.. 1 Tab By Mouth Daily For Allergies 4)  Norco 5-325 Mg Tabs (Hydrocodone-Acetaminophen) .Marland Kitchen.. 1 Tab By Mouth Two Times A Day As Needed Severe Pain Not Controlled With Neurontin/tylenol 5)  Simvastatin 80 Mg Tabs (Simvastatin) .Marland Kitchen.. 1 Tab By Mouth Daily For Cholesterol 6)  Amlodipine Besylate 10 Mg Tabs (Amlodipine Besylate) .Marland Kitchen.. 1 Tab By Mouth Daily For Blood Pressure 7)  Nortriptyline Hcl 25 Mg Caps (Nortriptyline Hcl) .Marland Kitchen.. 1 Tab By Mouth At Bedtime For 3 Days Then 2 Tabs By Mouth At Bedtime For Sleep  Allergies: 1)  Amoxicillin (Amoxicillin)  Social History: Smoking Status:  quit  Review of  Systems  The patient denies fever, weight loss, and weight gain.   GI:  Denies change in bowel habits, nausea, and vomiting. GU:  Denies abnormal vaginal bleeding, discharge, genital sores, urinary frequency, and urinary hesitancy.   Impression & Recommendations:  Problem # 1:  SUPRAPUBIC PAIN (ICD-789.09) Assessment Unchanged check labs as below, if normal, refer to gyn for assessment.  perhaps pain is from fibroids.  Her updated medication list for this problem includes:    Norco 5-325 Mg Tabs (Hydrocodone-acetaminophen) .Marland Kitchen... 1 tab by mouth two times a day as needed severe pain not controlled with neurontin/tylenol  Orders: GC/Chlamydia-[facility] (87591/87491) Wet Prep- [facility] (96295) Urinalysis-[facility] (00000) Urine Culture-[facility] ([id number])  Complete Medication List: 1)  Hydrochlorothiazide 25 Mg Tabs (Hydrochlorothiazide) .... Take 1/2  tablet daily for blood pressure control 2)  Neurontin 300 Mg Caps (Gabapentin) .... 2 tablets by mouth in am.  2 tablets by mouth mid-day; 3 tablets by mouth at night 3)  Cetirizine Hcl 10 Mg Tabs (Cetirizine hcl) .Marland Kitchen.. 1 tab by mouth daily for allergies 4)  Norco 5-325 Mg Tabs (Hydrocodone-acetaminophen) .Marland Kitchen.. 1 tab by mouth two times a day as needed severe pain not controlled with neurontin/tylenol 5)  Simvastatin 80 Mg Tabs (Simvastatin) .Marland KitchenMarland KitchenMarland Kitchen 1  tab by mouth daily for cholesterol 6)  Amlodipine Besylate 10 Mg Tabs (Amlodipine besylate) .Marland Kitchen.. 1 tab by mouth daily for blood pressure 7)  Nortriptyline Hcl 25 Mg Caps (Nortriptyline hcl) .Marland Kitchen.. 1 tab by mouth at bedtime for 3 days then 2 tabs by mouth at bedtime for sleep  Patient Instructions: 1)  It was nice to see you today. 2)  I will call you with your lab results. 3)  If they are all normal, we will refer you to gynecology to talk about your fibroids. 4)  Please schedule a follow-up appointment with your new doctor (Dr. Earnest Bailey) for your pain, hypertension  in 3 months .      Appended Document: Lab  reports Medications Added METRONIDAZOLE 500 MG TABS (METRONIDAZOLE) 1 tab by mouth two times a day for infection for 7 days METRONIDAZOLE 500 MG TABS (METRONIDAZOLE) 1 tab by mouth two times a day for infection for 7 days          Lab Visit  Laboratory Results   Urine Tests  Date/Time Received: November 14, 2009 4:10 PM  Date/Time Reported: November 14, 2009 4:50 PM   Routine Urinalysis   Color: yellow Appearance: Clear Glucose: negative   (Normal Range: Negative) Bilirubin: small;  reflex ictotest = negative   (Normal Range: Negative) Ketone: trace (5)   (Normal Range: Negative) Spec. Gravity: >=1.030   (Normal Range: 1.003-1.035) Blood: negative   (Normal Range: Negative) pH: 6.0   (Normal Range: 5.0-8.0) Protein: 30   (Normal Range: Negative) Urobilinogen: 1.0   (Normal Range: 0-1) Nitrite: negative   (Normal Range: Negative) Leukocyte Esterace: negative   (Normal Range: Negative)  Urine Microscopic WBC/HPF: occ Bacteria/HPF: 1+ Mucous/HPF: 2+ Epithelial/HPF: 15-25 with few clue cells Crystals/HPF: calcium oxalate    Comments: ...............test performed by......Marland KitchenBonnie A. [place], MLS (ASCP)cm  Date/Time Received: November 14, 2009 4:10 PM  Date/Time Reported: November 14, 2009 4:53 PM   Wet [place] Source: vag WBC/hpf: occ Bacteria/hpf: 3+  Cocci Clue cells/hpf: many  Positive whiff Yeast/hpf: none Trichomonas/hpf: none Comments: few sperm ...............test performed by......Marland KitchenBonnie A. [place], MLS (ASCP)cm   Prescriptions: METRONIDAZOLE 500 MG TABS (METRONIDAZOLE) 1 tab by mouth two times a day for infection for 7 days  #14 x 0   Entered and Authorized by:   Asher Muir MD   Signed by:   Asher Muir MD on 11/14/2009   Method used:   [facility]  [address] [phone]* (retail)       [address]       [place], [place]  [address]       Ph: [phone]       Fax: [phone]   RxID:    [id number]  Orders Today:   Appended Document: pelvic exam,tcb     Clinical Lists Changes  Orders: Added new Test order of [facility]- Est Level  3 (27253) - Signed

## 2010-06-19 NOTE — Assessment & Plan Note (Signed)
Summary: f/u,df   Vital Signs:  Patient profile:   50 year old female Height:      63.5 inches Weight:      254.7 pounds BMI:     44.57 Temp:     98 degrees F oral Pulse rate:   102 / minute BP sitting:   151 / 96  (left arm) Cuff size:   large  Vitals Entered By: Gladstone Pih (May 31, 2009 3:06 PM)  Serial Vital Signs/Assessments:  Time      Position  BP       Pulse  Resp  Temp     By                     110/62                         Asher Muir MD  CC: F/U legs and back, sleep, bp cholesterol Is Patient Diabetic? No   Primary Care Provider:  Asher Muir MD  CC:  F/U legs and back, sleep, and bp cholesterol.  History of Present Illness: 1.  legs and back--still with pain.  taking 6-9 vicodin daily.  taking neurontin.  meds are making her less sleepy.  uses cane to walk.  when pain gets really bad, uses the wheelchair.  only needing wheelchair about 2 times per month.  swelling of left leg has resolved.  is willling to go to PT to help with mobility  2.  sleep--script for amitryptiline did not go through; so she did not pick it up.  still not sleeping.  reports sleeping only about 1.5 hours per night.  tries to go to sleep at 10pm.  ususally does not fall asleep until 5am and then has to get up at 6:30 am with her granddaughter.  is taking one neurontin and 2 vicodin before bedtime.  briefly discussed sleep hygiene and she tries to do those things as well.  not falling asleep during hte day.  she feels that her not being able to go to sleep is largely due to not being able to get comfortable.  3.  bp--initial bp 151/96, but subsequent manual was 110/62.  on hctz and norvasc  4.  cholesterol--last LDL 166.  on simva 80  Habits & Providers  Alcohol-Tobacco-Diet     Tobacco Status: never  Current Medications (verified): 1)  Hydrochlorothiazide 25 Mg  Tabs (Hydrochlorothiazide) .... Take 1/2  Tablet Daily For Blood Pressure Control 2)  Neurontin 300 Mg Caps  (Gabapentin) .... 2 Tablets Three Times A Day For Pain 3)  Cetirizine Hcl 10 Mg Tabs (Cetirizine Hcl) .Marland Kitchen.. 1 Tab By Mouth Daily For Allergies 4)  Hydrocodone-Acetaminophen 5-500 Mg Tabs (Hydrocodone-Acetaminophen) .Marland Kitchen.. 1-2 Tabs By Mouth Q 6 Hours For Severe Pain 5)  Simvastatin 80 Mg Tabs (Simvastatin) .Marland Kitchen.. 1 Tab By Mouth Daily For Cholesterol 6)  Amlodipine Besylate 10 Mg Tabs (Amlodipine Besylate) .Marland Kitchen.. 1 Tab By Mouth Daily For Blood Pressure 7)  Amitriptyline Hcl 25 Mg Tabs (Amitriptyline Hcl) .Marland Kitchen.. 1 Tab By Mouth At Bedtime For Pain and Sleep For 1 Week; Then 2 Tabs By Mouth At Bedtime  Allergies: 1)  Amoxicillin (Amoxicillin)  Social History: Smoking Status:  never  Physical Exam  General:  Obese, well groomed, pleasant.   Msk:  gait:  hobbling, strongly favoring right side.  does not put heel down when she walks.  used a cane coming back to the office Extremities:  left  ankle, no longer swollen.   no significant erythema.   trace edema.  no ttp  Psych:  normal affect,Oriented X3 and not depressed appearing.   Additional Exam:  vital signs reviewed    Impression & Recommendations:  Problem # 1:  BACK PAIN, LUMBAR (ICD-724.2) Assessment Unchanged  continue vicodin.  may need to reduce tylenol component if she is using >8 per day.  continue neurontin.   Her updated medication list for this problem includes:    Hydrocodone-acetaminophen 5-500 Mg Tabs (Hydrocodone-acetaminophen) .Marland Kitchen... 1-2 tabs by mouth q 6 hours for severe pain  Orders: [facility]- Est  Level 4 (54098)  Problem # 2:  GAIT DISTURBANCE (ICD-781.2) Assessment: Unchanged  refer to physical therapy to work on gait.    Orders: [facility]- Est  Level 4 (11914) Physical Therapy Referral (PT)  Problem # 3:  HYPERLIPIDEMIA (ICD-272.4) Assessment: Unchanged check d-ldl Her updated medication list for this problem includes:    Simvastatin 80 Mg Tabs (Simvastatin) .Marland Kitchen... 1 tab by mouth daily for  cholesterol  Orders: T-Lipoprotein (LDL cholesterol)  (78[address]-62130) [facility]- Est  Level 4 (99214)  Problem # 4:  HYPERTENSION, BENIGN SYSTEMIC (ICD-401.1) Assessment: Unchanged  at goal on manual check Her updated medication list for this problem includes:    Hydrochlorothiazide 25 Mg Tabs (Hydrochlorothiazide) .Marland Kitchen... Take 1/2  tablet daily for blood pressure control    Amlodipine Besylate 10 Mg Tabs (Amlodipine besylate) .Marland Kitchen... 1 tab by mouth daily for blood pressure  Orders: [facility]- Est  Level 4 (99214)  Complete Medication List: 1)  Hydrochlorothiazide 25 Mg Tabs (Hydrochlorothiazide) .... Take 1/2  tablet daily for blood pressure control 2)  Neurontin 300 Mg Caps (Gabapentin) .... 2 tablets three times a day for pain 3)  Cetirizine Hcl 10 Mg Tabs (Cetirizine hcl) .Marland Kitchen.. 1 tab by mouth daily for allergies 4)  Hydrocodone-acetaminophen 5-500 Mg Tabs (Hydrocodone-acetaminophen) .Marland Kitchen.. 1-2 tabs by mouth q 6 hours for severe pain 5)  Simvastatin 80 Mg Tabs (Simvastatin) .Marland Kitchen.. 1 tab by mouth daily for cholesterol 6)  Amlodipine Besylate 10 Mg Tabs (Amlodipine besylate) .Marland Kitchen.. 1 tab by mouth daily for blood pressure 7)  Nortriptyline Hcl 25 Mg Caps (Nortriptyline hcl) .Marland Kitchen.. 1 tab by mouth at bedtime for 3 days then 2 tabs by mouth at bedtime for sleep  Patient Instructions: 1)  It was nice to see you today. 2)  For your sleep, try the nortryptiline I prescribed you.  3)  Also, try taking 2-3 neurontin at bedtime.   4)  We will help set you back up with physical therapy to help with your walking.  5)  Other things to try: going to bed at the same time each night, sleeping in your bedroom each night, keeping the TV off, keeping your room really dark, avoiding caffeine in the afternoon and evening, putting a fan or noise blocker on at night.  Prescriptions: NORTRIPTYLINE HCL 25 MG CAPS (NORTRIPTYLINE HCL) 1 tab by mouth at bedtime for 3 days then 2 tabs by mouth at bedtime for sleep  #60 x 1   Entered and  Authorized by:   Asher Muir MD   Signed by:   Asher Muir MD on 05/31/2009   Method used:   Electronically to        [facility]  [address] [phone]* (retail)       [address]       [place], [place]  [address]  Ph: [phone]       Fax: [phone]   RxID:   [id number]   Prevention & Chronic Care Immunizations   Influenza vaccine: Fluvax Non-MCR  (02/21/2009)   Influenza vaccine due: 06/11/2008    Tetanus booster: 12/23/2003: Done.   Tetanus booster due: 12/22/2013    Pneumococcal vaccine: Not documented  Other Screening   Pap smear: NEGATIVE FOR INTRAEPITHELIAL LESIONS OR MALIGNANCY.  (07/11/2008)   Pap smear due: 06/2010    Mammogram: Normal  (07/13/2008)   Mammogram due: 07/2009   Smoking status: never  (05/31/2009)  Lipids   Total Cholesterol: 227  (07/13/2008)   Lipid panel action/deferral: LDL Direct Ordered   LDL: 630  (07/13/2008)   LDL Direct: Not documented   HDL: 38  (07/13/2008)   Triglycerides: 114  (07/13/2008)   Lipid panel due: 04/28/2009    SGOT (AST): 36  (07/13/2008)   SGPT (ALT): 59  (07/13/2008)   Alkaline phosphatase: 116  (07/13/2008)   Total bilirubin: 0.6  (07/13/2008)    Lipid flowsheet reviewed?: Yes   Progress toward LDL goal: Unchanged  Hypertension   Last Blood Pressure: 151 / 96  (05/31/2009)   Serum creatinine: 0.87  (07/13/2008)   Serum potassium 4.0  (07/13/2008)    Hypertension flowsheet reviewed?: Yes   Progress toward BP goal: At goal   Hypertension comments: repeat bp 110/62  Self-Management Support :   Personal Goals (by the next clinic visit) :      Personal blood pressure goal: 140/90  (01/18/2009)     Personal LDL goal: 130  (01/27/2009)    Hypertension self-management support: Written self-care plan  (01/18/2009)    Lipid self-management support: Lipid monitoring log, Written self-care plan, Education handout, Pre-printed educational material, Referred for  self-management class  (05/31/2009)   Lipid self-care plan printed.   Lipid education handout printed

## 2010-06-19 NOTE — Progress Notes (Signed)
Summary: phn msg   Phone Note Other Incoming Call back at [phone]   Caller: Cami Sanders Social Worker [place] Summary of Call: Ms. Salvador daughter is asking for assistance finiacially due to being needed in the home the whole month on Janruary.  They department of Social Service office is needing confirmation of this before they can help her with anything. Initial call taken by: Clydell Hakim,  June 28, 2009 9:29 AM  Follow-up for Phone Call        To PCP Follow-up by: Gladstone Pih,  June 28, 2009 10:09 AM  Additional Follow-up for Phone Call Additional follow up Details #1::        revised letter written and placed in to be called box Additional Follow-up by: Asher Muir MD,  June 28, 2009 1:44 PM

## 2010-06-19 NOTE — Assessment & Plan Note (Signed)
Summary: f/u,tcb   Vital Signs:  Patient profile:   50 year old female Weight:      249.7 pounds Temp:     98.6 degrees F oral Pulse rate:   80 / minute Pulse rhythm:   regular BP sitting:   120 / 78  (left arm) Cuff size:   large  Vitals Entered By: Loralee Pacas CMA (Sep 20, 2009 11:46 AM)  Primary Care Provider:  Asher Muir MD  CC:  leg pain, left hand pain, and htn.  History of Present Illness: 1.  pain flared up in left leg, but no swelling.  still with residual numbness in right leg since surgery  2.  pain in left hand.  shooting pain from forearm to thumb.  past 3-4 months.  intermittent.  about 3 times a day.  lasts about 5 minutes.  after the pain goes away, entire hand tingles.  has reduced grip strength during that period--this lasts about 3 minutes.  no known injury or overuse.  (pt is right-handed).  does have hx of bilateral carpal tunnel s/p surgery years ago (80s?)  3.  hypertension--great today 120/78.  on hctz and amlodipine  Current Medications (verified): 1)  Hydrochlorothiazide 25 Mg  Tabs (Hydrochlorothiazide) .... Take 1/2  Tablet Daily For Blood Pressure Control 2)  Neurontin 300 Mg Caps (Gabapentin) .... 2 Tablets By Mouth in Am.  2 Tablets By Mouth Mid-Day; 3 Tablets By Mouth At Night 3)  Cetirizine Hcl 10 Mg Tabs (Cetirizine Hcl) .Marland Kitchen.. 1 Tab By Mouth Daily For Allergies 4)  Norco 5-325 Mg Tabs (Hydrocodone-Acetaminophen) .Marland Kitchen.. 1 Tab By Mouth Two Times A Day As Needed Severe Pain Not Controlled With Neurontin/tylenol 5)  Simvastatin 80 Mg Tabs (Simvastatin) .Marland Kitchen.. 1 Tab By Mouth Daily For Cholesterol 6)  Amlodipine Besylate 10 Mg Tabs (Amlodipine Besylate) .Marland Kitchen.. 1 Tab By Mouth Daily For Blood Pressure 7)  Nortriptyline Hcl 25 Mg Caps (Nortriptyline Hcl) .Marland Kitchen.. 1 Tab By Mouth At Bedtime For 3 Days Then 2 Tabs By Mouth At Bedtime For Sleep  Allergies: 1)  Amoxicillin (Amoxicillin)  Past History:  Past Medical History: history of allergic rhinitis,  history of asthma, history of GERD, htn morbid obestiy lumbar stenosis and disc disease  Past Surgical History: Bilat carpel tunnel release 1988 - 01/05/2004, R ovarian mass removed 1995 - 01/05/2004, surgery for ruptured disc 9/09 (discectomy, laminectomy)  Physical Exam  General:  Obese, well groomed, pleasant.   Lungs:  Normal respiratory effort, chest expands symmetrically. Lungs are clear to auscultation, no crackles or wheezes. Heart:  Normal rate and regular rhythm. S1 and S2 normal without gallop, murmur, click, rub or other extra sounds. Msk:  hands wrists:  no swelling or bony defomity.  4+/5 grip strength left hand.  5/5 left hand.  4+/5 thumb strength left hand; 5/5 right hand.  grossly normal sensation.  +tinels and +phalens bilaterally. 2+ radial pulses  lower legs:  no obvious swelling.  mild ttp calves left>right.  neg homans Additional Exam:  vital signs reviewed    Impression & Recommendations:  Problem # 1:  LEG PAIN, CHRONIC, LEFT (ICD-729.5) Assessment Unchanged  think this is result of nerve damage from her back problems.  recent exacerbation, but improving now.  not having any swelling.  continue current pain regimen of neurontin and norco  Orders: [facility]- Est  Level 4 (34742)  Problem # 2:  HYPERTENSION, BENIGN SYSTEMIC (ICD-401.1) Assessment: Unchanged good control.  needs cmet Her updated medication list for this  problem includes:    Hydrochlorothiazide 25 Mg Tabs (Hydrochlorothiazide) .Marland Kitchen... Take 1/2  tablet daily for blood pressure control    Amlodipine Besylate 10 Mg Tabs (Amlodipine besylate) .Marland Kitchen... 1 tab by mouth daily for blood pressure  Orders: Comp Met-[facility] ([address]74-25956) [facility]- Est  Level 4 (38756)  Problem # 3:  HAND PAIN, LEFT (ICD-729.5) Assessment: New  exam consistent with carpal tunnel.  I think that is the most likely dx given her past history.  try cock-up wrist splints.  rtc one month.  if not better consider injection vs referral back to  surgery  Orders: [facility]- Est  Level 4 (43329)  Complete Medication List: 1)  Hydrochlorothiazide 25 Mg Tabs (Hydrochlorothiazide) .... Take 1/2  tablet daily for blood pressure control 2)  Neurontin 300 Mg Caps (Gabapentin) .... 2 tablets by mouth in am.  2 tablets by mouth mid-day; 3 tablets by mouth at night 3)  Cetirizine Hcl 10 Mg Tabs (Cetirizine hcl) .Marland Kitchen.. 1 tab by mouth daily for allergies 4)  Norco 5-325 Mg Tabs (Hydrocodone-acetaminophen) .Marland Kitchen.. 1 tab by mouth two times a day as needed severe pain not controlled with neurontin/tylenol 5)  Simvastatin 80 Mg Tabs (Simvastatin) .Marland Kitchen.. 1 tab by mouth daily for cholesterol 6)  Amlodipine Besylate 10 Mg Tabs (Amlodipine besylate) .Marland Kitchen.. 1 tab by mouth daily for blood pressure 7)  Nortriptyline Hcl 25 Mg Caps (Nortriptyline hcl) .Marland Kitchen.. 1 tab by mouth at bedtime for 3 days then 2 tabs by mouth at bedtime for sleep  Other Orders: Mammogram (Screening) (Mammo)  Patient Instructions: 1)  It was nice to see you today.  2)  Go to [facility] or another pharmacy and get some cock-up wrist splints. 3)  I will call if your labs are not normal. 4)  It is time for your mammogram. 5)  Please schedule a follow-up appointment in 1 month.   Prevention & Chronic Care Immunizations   Influenza vaccine: Fluvax Non-MCR  (02/21/2009)   Influenza vaccine due: 06/11/2008    Tetanus booster: 12/23/2003: Done.   Tetanus booster due: 12/22/2013    Pneumococcal vaccine: Not documented  Other Screening   Pap smear: NEGATIVE FOR INTRAEPITHELIAL LESIONS OR MALIGNANCY.  (07/11/2008)   Pap smear due: 07/03/2011    Mammogram: Normal  (07/13/2008)   Mammogram action/deferral: Ordered  (09/20/2009)   Mammogram due: 07/2009   Smoking status: never  (05/31/2009)  Lipids   Total Cholesterol: 227  (07/13/2008)   Lipid panel action/deferral: LDL Direct Ordered   LDL: 518  (07/13/2008)   LDL Direct: 119  (05/31/2009)   HDL: 38  (07/13/2008)    Triglycerides: 114  (07/13/2008)   Lipid panel due: 04/28/2009    SGOT (AST): 36  (07/13/2008)   SGPT (ALT): 59  (07/13/2008) CMP ordered    Alkaline phosphatase: 116  (07/13/2008)   Total bilirubin: 0.6  (07/13/2008)    Lipid flowsheet reviewed?: Yes   Progress toward LDL goal: At goal  Hypertension   Last Blood Pressure: 120 / 78  (09/20/2009)   Serum creatinine: 0.87  (07/13/2008)   Serum potassium 4.0  (07/13/2008) CMP ordered     Hypertension flowsheet reviewed?: Yes   Progress toward BP goal: At goal  Self-Management Support :   Personal Goals (by the next clinic visit) :      Personal blood pressure goal: 140/90  (01/18/2009)     Personal LDL goal: 130  (01/27/2009)    Hypertension self-management support: Written self-care plan  (01/18/2009)  Hypertension self-management support not done because: Good outcomes  (07/10/2009)    Lipid self-management support: Lipid monitoring log, Written self-care plan, Education handout, Pre-printed educational material, Referred for self-management class  (05/31/2009)     Lipid self-management support not done because: Good outcomes  (07/10/2009)   Nursing Instructions: Schedule screening mammogram (see order)

## 2010-06-19 NOTE — Consult Note (Signed)
Summary: [facility] Discharge Summary  [facility] Discharge Summary   Imported By: Clydell Hakim 07/20/2009 15:48:26  _____________________________________________________________________  External Attachment:    Type:   Image     Comment:   External Document

## 2010-06-19 NOTE — Letter (Signed)
Summary: Generic Letter  [facility]  [address]   [place], [place] [address]   Phone: [phone]  Fax: [phone]    07/10/2009  Kristen [name] [address] [place], [place]  [address]  To whom it may concern:  Kristen [name] is needed to help her mother in her mother's home doing household chores/assisting with IADLs for approximately 4-5 hours per week.             Sincerely,   Asher Muir MD

## 2010-06-19 NOTE — Assessment & Plan Note (Signed)
Summary: F/U/KH   Vital Signs:  Patient profile:   50 year old female Weight:      244 pounds Temp:     97.7 degrees F oral Pulse rate:   112 / minute Pulse rhythm:   regular BP sitting:   100 / 69  (left arm) Cuff size:   large  Vitals Entered By: Loralee Pacas CMA (January 16, 2010 11:12 AM)  Primary Care Provider:  Delbert Harness MD   History of Present Illness: 50 yo here for follow up of chronic pain and lab results from last visit.  Chronic pain:  continues to have B LE myalgias and neurpathy s/p lumbar laminectomy for spinal stenosis.  Also continues having bilateral upper extremity myalgias as well as "pins and needles".  Has history of carpal tunnel bilaterally.  Since last visit, has been managing pain with Gabapentin 300 three times a day and vicodin.  Also has TCA.  Has not gone topain specialist as disucssed as she said the last one "put a needle in her".  States she has already treid physical therapy without any help.  At last visit discussed daily exercise.  Currenlty she states she has increased to walking to the [employer].  HLD: no changes in therapeutic lifestyle modification.  Has been taking liptor without any side effects.  (changed from simvaststain)    Current Medications (verified): 1)  Hydrochlorothiazide 25 Mg  Tabs (Hydrochlorothiazide) .... Take 1/2  Tablet Daily For Blood Pressure Control 2)  Neurontin 300 Mg Caps (Gabapentin) .... Take 1 Tablet At [place], Lunch and 2 Tabs Before Bedtime 3)  Cetirizine Hcl 10 Mg Tabs (Cetirizine Hcl) .Marland Kitchen.. 1 Tab By Mouth Daily For Allergies 4)  Norco 5-325 Mg Tabs (Hydrocodone-Acetaminophen) .Marland Kitchen.. 1 Tab By Mouth Two Times A Day As Needed Severe Pain Not Controlled With Neurontin/tylenol 5)  Crestor 20 Mg Tabs (Rosuvastatin Calcium) .... One Half Tablet Daily For 2 Weeks, Then 1 Tablet Daily 6)  Amlodipine Besylate 10 Mg Tabs (Amlodipine Besylate) .Marland Kitchen.. 1 Tab By Mouth Daily For Blood Pressure 7)  Nortriptyline Hcl  25 Mg Caps (Nortriptyline Hcl) .Marland Kitchen.. 1 Tab By Mouth At Bedtime As Needed For Sleep 8)  Cymbalta 30 Mg Cpep (Duloxetine Hcl) .... Take One Tablet Daily For 1 Week, Then Two Tablets Daily  Allergies: 1)  Amoxicillin (Amoxicillin) PMH-FH-SH reviewed-no changes except otherwise noted  Review of Systems      See HPI  Physical Exam  General:  Obese, well groomed, pleasant.  Has a cane. Neurologic:  upper extremities: normal sensation to light touch.  Significant giving way with strength testing.  POints to entire dorsal upper and lower arm and entire palmar surface of hands as areas affected by tingling.   Impression & Recommendations:  Problem # 1:  MYALGIA (ICD-729.1)  Will increase neurotin and add cymbalta for chronic pain as well as she has a history of depression.  Continue Norco.  Question etiology of neuropathic like symptoms in upper extremity.  Labwork for inflammatory causes of myalgia negative.  Will follow-up in 3-4 weeks and consider cervical xray.  Her updated medication list for this problem includes:    Norco 5-325 Mg Tabs (Hydrocodone-acetaminophen) .Marland Kitchen... 1 tab by mouth two times a day as needed severe pain not controlled with neurontin/tylenol  Orders: [facility]- Est Level  3 (94854)  Problem # 2:  HYPERLIPIDEMIA (ICD-272.4)  Discussed lifestyle modifications.  Patient verbalizes she is planning on paying more attention to what she is eating.  Her updated  medication list for this problem includes:    Crestor 20 Mg Tabs (Rosuvastatin calcium) ..... One half tablet daily for 2 weeks, then 1 tablet daily  Orders: [facility]- Est Level  3 (45409)  Complete Medication List: 1)  Hydrochlorothiazide 25 Mg Tabs (Hydrochlorothiazide) .... Take 1/2  tablet daily for blood pressure control 2)  Neurontin 300 Mg Caps (Gabapentin) .... Take 1 tablet at breakfats, lunch and 2 tabs before bedtime 3)  Cetirizine Hcl 10 Mg Tabs (Cetirizine hcl) .Marland Kitchen.. 1 tab by mouth daily for allergies 4)  Norco  5-325 Mg Tabs (Hydrocodone-acetaminophen) .Marland Kitchen.. 1 tab by mouth two times a day as needed severe pain not controlled with neurontin/tylenol 5)  Crestor 20 Mg Tabs (Rosuvastatin calcium) .... One half tablet daily for 2 weeks, then 1 tablet daily 6)  Amlodipine Besylate 10 Mg Tabs (Amlodipine besylate) .Marland Kitchen.. 1 tab by mouth daily for blood pressure 7)  Nortriptyline Hcl 25 Mg Caps (Nortriptyline hcl) .Marland Kitchen.. 1 tab by mouth at bedtime as needed for sleep 8)  Cymbalta 30 Mg Cpep (Duloxetine hcl) .... Take one tablet daily for 1 week, then two tablets daily  Patient Instructions: 1)  Start cymbalta 2)  may increase neurontin (gabapentin) 3)  Work on walking a little bit every day. 4)  Followup in 3-4 weeks Prescriptions: NORCO 5-325 MG TABS (HYDROCODONE-ACETAMINOPHEN) 1 tab by mouth two times a day as needed severe pain not controlled with neurontin/tylenol  #60 x 5   Entered and Authorized by:   Delbert Harness MD   Signed by:   Delbert Harness MD on 01/16/2010   Method used:   Print then Give to Patient   RxID:   [id number] NEURONTIN 300 MG CAPS (GABAPENTIN) take 1 tablet at breakf[place], lunch and 2 tabs before bedtime  #120 x 1   Entered and Authorized by:   Delbert Harness MD   Signed by:   Delbert Harness MD on 01/16/2010   Method used:   Print then Give to Patient   RxID:   [id number] CYMBALTA 30 MG CPEP (DULOXETINE HCL) take one tablet daily for 1 week, then two tablets daily  #60 x 1   Entered and Authorized by:   Delbert Harness MD   Signed by:   Delbert Harness MD on 01/16/2010   Method used:   Print then Give to Patient   RxID:   [id number]    Prevention & Chronic Care Immunizations   Influenza vaccine: Fluvax Non-MCR  (02/21/2009)   Influenza vaccine due: 06/11/2008    Tetanus booster: 12/23/2003: Done.   Tetanus booster due: 12/22/2013    Pneumococcal vaccine: Not documented  Other Screening   Pap smear: NEGATIVE FOR INTRAEPITHELIAL LESIONS OR MALIGNANCY.  (07/11/2008)   Pap smear  due: 07/03/2011    Mammogram: Normal  (07/13/2008)   Mammogram action/deferral: Ordered  (09/20/2009)   Mammogram due: 07/2009   Smoking status: quit  (12/15/2009)  Lipids   Total Cholesterol: 227  (07/13/2008)   Lipid panel action/deferral: LDL Direct Ordered   LDL: 034  (07/13/2008)   LDL Direct: 195  (12/15/2009)   HDL: 38  (07/13/2008)   Triglycerides: 114  (07/13/2008)   Lipid panel due: 04/28/2009    SGOT (AST): 14  (12/15/2009)   SGPT (ALT): 11  (12/15/2009)   Alkaline phosphatase: 99  (12/15/2009)   Total bilirubin: 0.4  (12/15/2009)    Lipid flowsheet reviewed?: Yes   Progress toward LDL goal: Deteriorated  Hypertension   Last Blood Pressure: 100 / 69  (  01/16/2010)   Serum creatinine: 0.92  (12/15/2009)   Serum potassium 3.7  (12/15/2009)  Self-Management Support :   Personal Goals (by the next clinic visit) :      Personal blood pressure goal: 140/90  (01/18/2009)     Personal LDL goal: 130  (01/27/2009)    Patient will work on the following items until the next clinic visit to reach self-care goals:     Medications and monitoring: take my medicines every day  (01/16/2010)     Activity: take a 30 minute walk every day  (01/16/2010)    Hypertension self-management support: Written self-care plan  (01/18/2009)    Hypertension self-management support not done because: Good outcomes  (07/10/2009)    Lipid self-management support: Lipid monitoring log, Written self-care plan, Education handout, Pre-printed educational material, Referred for self-management class  (05/31/2009)     Lipid self-management support not done because: Good outcomes  (07/10/2009)

## 2010-06-19 NOTE — Assessment & Plan Note (Signed)
Summary: pain all over,df   Vital Signs:  Patient profile:   50 year old female Height:      63.5 inches Weight:      251 pounds BMI:     43.92 Temp:     98.3 degrees F oral Pulse rate:   106 / minute BP sitting:   129 / 89  (right arm) Cuff size:   large  Vitals Entered By: Jimmy Footman, CMA (April 16, 2010 10:23 AM) CC: body pain & leg and foot numbness Is Patient Diabetic? No Pain Assessment Patient in pain? yes     Location: body Intensity: 10 Type: sharp   Primary Care Provider:  Delbert Harness MD  CC:  body pain & leg and foot numbness.  History of Present Illness: 50 yo here for follow-up of chronic pain  Chronic pain:  She says a friend told her there are morphine pills available and asks if this would be a good option for her.  Does nto have daily exercise. Pain continues to be characterized as pain everywhere with parasthesias of arms and legs, now more bilateral lower legs and less in arms.  HYPERTENSION Meds: Taking and tolerating? yes Home BP's: no Chest Pain: no Dyspnea: no  Obesity:  Continues to increase weight from last two visits.  NO regular exercise or dietary plan.   Habits & Providers  Alcohol-Tobacco-Diet     Tobacco Status: quit  Current Medications (verified): 1)  Hydrochlorothiazide 25 Mg  Tabs (Hydrochlorothiazide) .... Take 1/2  Tablet Daily For Blood Pressure Control 2)  Neurontin 300 Mg Caps (Gabapentin) .... Slowly Increase To 2 Tabs Three Times A Day 3)  Cetirizine Hcl 10 Mg Tabs (Cetirizine Hcl) .Marland Kitchen.. 1 Tab By Mouth Daily For Allergies 4)  Norco 5-325 Mg Tabs (Hydrocodone-Acetaminophen) .Marland Kitchen.. 1 Tab By Mouth Two Times A Day As Needed Severe Pain Not Controlled With Neurontin/tylenol 5)  Crestor 20 Mg Tabs (Rosuvastatin Calcium) .... One Half Tablet Daily For 2 Weeks, Then 1 Tablet Daily 6)  Amlodipine Besylate 10 Mg Tabs (Amlodipine Besylate) .Marland Kitchen.. 1 Tab By Mouth Daily For Blood Pressure 7)  Nortriptyline Hcl 25 Mg Caps (Nortriptyline  Hcl) .Marland Kitchen.. 1 Tab By Mouth At Bedtime As Needed For Sleep 8)  Cymbalta 60 Mg Cpep (Duloxetine Hcl)  Allergies: 1)  Amoxicillin (Amoxicillin) PMH-FH-SH reviewed for relevance  Social History: Smoking Status:  quit  Review of Systems      See HPI  Physical Exam  General:  Obese, well groomed, pleasant.  Has a cane. Lungs:  Normal respiratory effort, chest expands symmetrically. Lungs are clear to auscultation, no crackles or wheezes. Heart:  Normal rate and regular rhythm. S1 and S2 normal without gallop, murmur, click, rub or other extra sounds. Msk:  Patient states she is esquisitely tender on deep and light palpation diffusely without any pattern significant anatomically or consistant with fibromyalgia tender points. Extremities:  no significant LE swelling Neurologic:  sensation to light touch over bilteral upper extremities intact.  Patient gives way symmetrically when evaluating for upper and lower extremity weakness.   Patellar reflexes diffiult to elicit bilaterally.   Impression & Recommendations:  Problem # 1:  MYALGIA (ICD-729.1) Continued myalgias and nerve pain.  Mildy elevated CK not consistant with inflammatory disorder.  Will increase gabapentin.  Concern for secondary gain.    Her updated medication list for this problem includes:    Norco 5-325 Mg Tabs (Hydrocodone-acetaminophen) .Marland Kitchen... 1 tab by mouth two times a day as needed severe pain  not controlled with neurontin/tylenol  Orders: CK (Creatine Kinase)-[facility] ([address]50-23[address]) [facility]- Est  Level 4 (16109)  Problem # 2:  MORBID OBESITY (ICD-278.01)  she feels nutritional consult would be beneficial.  Asked her to make appt with Dr. Gerilyn Pilgrim, gave her nutritional handout and patient will keep food diary in preparatin for visit.  Ht: 63.5 (04/16/2010)   Wt: 251 (04/16/2010)   BMI: 43.92 (04/16/2010)  Orders: [facility]- Est  Level 4 (60454)  Problem # 3:  HYPERTENSION, BENIGN SYSTEMIC (ICD-401.1)  stable today  Her updated  medication list for this problem includes:    Hydrochlorothiazide 25 Mg Tabs (Hydrochlorothiazide) .Marland Kitchen... Take 1/2  tablet daily for blood pressure control    Amlodipine Besylate 10 Mg Tabs (Amlodipine besylate) .Marland Kitchen... 1 tab by mouth daily for blood pressure  BP today: 129/89 Prior BP: 142/80 (02/13/2010)  Prior 10 Yr Risk Heart Disease: 8 % (06/12/2007)  Labs Reviewed: K+: 3.7 (12/15/2009) Creat: : 0.92 (12/15/2009)   Chol: 227 (07/13/2008)   HDL: 38 (07/13/2008)   LDL: 166 (07/13/2008)   TG: 114 (07/13/2008)  Orders: [facility]- Est  Level 4 (09811)  Complete Medication List: 1)  Hydrochlorothiazide 25 Mg Tabs (Hydrochlorothiazide) .... Take 1/2  tablet daily for blood pressure control 2)  Neurontin 300 Mg Caps (Gabapentin) .... Slowly increase to 2 tabs three times a day 3)  Cetirizine Hcl 10 Mg Tabs (Cetirizine hcl) .Marland Kitchen.. 1 tab by mouth daily for allergies 4)  Norco 5-325 Mg Tabs (Hydrocodone-acetaminophen) .Marland Kitchen.. 1 tab by mouth two times a day as needed severe pain not controlled with neurontin/tylenol 5)  Crestor 20 Mg Tabs (Rosuvastatin calcium) .... One half tablet daily for 2 weeks, then 1 tablet daily 6)  Amlodipine Besylate 10 Mg Tabs (Amlodipine besylate) .Marland Kitchen.. 1 tab by mouth daily for blood pressure 7)  Nortriptyline Hcl 25 Mg Caps (Nortriptyline hcl) .Marland Kitchen.. 1 tab by mouth at bedtime as needed for sleep 8)  Cymbalta 60 Mg Cpep (Duloxetine hcl)  Other Orders: B12-[facility] (91478-29562)  Patient Instructions: 1)  It is very important for you to work on weight and daily exercise to stay as functional as possible 2)  Will increase gabapentin to help with nerve pain 3)  make Nutrition appointment with Dr. Gerilyn Pilgrim- see handout to get started- Keep food log to three days- write down everythign you eat or drink and what time and bring to your appointment with her 4)  Follow-up with me in 2 months Prescriptions: NEURONTIN 300 MG CAPS (GABAPENTIN) slowly increase to 2 tabs three times a day  #180 x  5   Entered and Authorized by:   Delbert Harness MD   Signed by:   Delbert Harness MD on 04/16/2010   Method used:   Electronically to        [facility] [phone]* (retail)       [address]       [place], [place]  [address]       Ph: [phone]       Fax: [phone]   RxID:   [id number]    Orders Added: 1)  CK (Creatine Kinase)-[facility] [82550-23250] 2)  B12-[facility] [82607-23330] 3)  [facility]- Est  Level 4 [38756]     Prevention & Chronic Care Immunizations   Influenza vaccine: Fluvax Non-MCR  (02/21/2009)   Influenza vaccine due: 06/11/2008    Tetanus booster: 12/23/2003: Done.   Tetanus booster due: 12/22/2013    Pneumococcal vaccine: Not documented  Other Screening   Pap smear: NEGATIVE FOR INTRAEPITHELIAL  LESIONS OR MALIGNANCY.  (07/11/2008)   Pap smear due: 07/03/2011    Mammogram: Normal  (07/13/2008)   Mammogram action/deferral: Ordered  (09/20/2009)   Mammogram due: 07/2009   Smoking status: quit  (04/16/2010)  Lipids   Total Cholesterol: 227  (07/13/2008)   Lipid panel action/deferral: LDL Direct Ordered   LDL: 161  (07/13/2008)   LDL Direct: 195  (12/15/2009)   HDL: 38  (07/13/2008)   Triglycerides: 114  (07/13/2008)   Lipid panel due: 04/28/2009    SGOT (AST): 14  (12/15/2009)   SGPT (ALT): 11  (12/15/2009)   Alkaline phosphatase: 99  (12/15/2009)   Total bilirubin: 0.4  (12/15/2009)  Hypertension   Last Blood Pressure: 129 / 89  (04/16/2010)   Serum creatinine: 0.92  (12/15/2009)   Serum potassium 3.7  (12/15/2009)    Hypertension flowsheet reviewed?: Yes   Progress toward BP goal: At goal  Self-Management Support :   Personal Goals (by the next clinic visit) :      Personal blood pressure goal: 140/90  (01/18/2009)     Personal LDL goal: 130  (01/27/2009)    Hypertension self-management support: Written self-care plan  (01/18/2009)    Hypertension self-management support not done because: Good outcomes  (04/16/2010)    Lipid  self-management support: Lipid monitoring log, Written self-care plan, Education handout, Pre-printed educational material, Referred for self-management class  (05/31/2009)     Lipid self-management support not done because: Good outcomes  (07/10/2009)

## 2010-06-21 NOTE — Miscellaneous (Signed)
Summary: Controlled Substances Contract  Controlled Substances Contract   Imported By: Bradly Bienenstock 05/18/2010 12:08:07  _____________________________________________________________________  External Attachment:    Type:   Image     Comment:   External Document

## 2010-06-21 NOTE — Letter (Signed)
Summary: Residual Functional Capacity Questionnaire  Residual Functional Capacity Questionnaire   Imported By: Bradly Bienenstock 05/18/2010 12:07:29  _____________________________________________________________________  External Attachment:    Type:   Image     Comment:   External Document

## 2010-06-21 NOTE — Miscellaneous (Signed)
Summary: [place]- [place] [employer]  [place] [employer]   Imported By: De Nurse 06/04/2010 13:53:56  _____________________________________________________________________  External Attachment:    Type:   Image     Comment:   External Document

## 2010-06-21 NOTE — Progress Notes (Signed)
----   Converted from flag ---- ---- 05/01/2010 4:44 PM, Milinda Antis MD wrote: This pt has an appt with Neurosurgery next week. Her MRI was done at [facility]. She needs a copy of the imaging put onto a CD and she needs to take it to the visit. ------------------------------  called pt informed of above message.

## 2010-06-21 NOTE — Assessment & Plan Note (Signed)
Summary: pain in legs/hip,df   Vital Signs:  Patient profile:   50 year old female Height:      63.5 inches Weight:      245.2 pounds BMI:     42.91 Temp:     97.8 degrees F oral Pulse rate:   86 / minute BP sitting:   170 / 108  (right arm) Cuff size:   large  Vitals Entered By: Jimmy Footman, CMA (May 17, 2010 3:21 PM)  Serial Vital Signs/Assessments:  Time      Position  BP       Pulse  Resp  Temp     By                     140/98                         Milinda Antis MD  CC: follow up/ med refill Is Patient Diabetic? Yes Did you bring your meter with you today? No Pain Assessment Patient in pain? yes        Primary Care Provider:  Delbert Harness MD  CC:  follow up/ med refill.  History of Present Illness:  Pt needs refill on all meds  Back pain- seen by Neurosurgery, her son who is in the 11th grade was present, pt could not remember details of the visit but stated the surgeon felt like her pain was not due to findings on MRI, no surgery was needed and started on predisone course, she has 4 days left of prednisone.  Pt brought in Functional Capicity Questionaire to be completed for her disability appeal, see scanned document. Pain unchanged in back, increased burning sensation from back to legs, no falls, no change in bowel or bladder, feels like right foot is still  turning . More specifically feels her right leg is numb compared to the left. Pain starts at buttocks and radiates down to feet.   Able to ambulate occ with assistance with cane, states she has a wheelchair at home, pain occurs with various movements and after sitting long periods of time. Norco helps but needs it 4 x a day, taking increased dose of neuroton per report    Reviewed BP- taking meds as prescribed, no other elevated BP at this level in past  Habits & Providers  Alcohol-Tobacco-Diet     Tobacco Status: quit  Current Medications (verified): 1)  Hydrochlorothiazide 25 Mg  Tabs  (Hydrochlorothiazide) .... Take 1/2  Tablet Daily For Blood Pressure Control 2)  Neurontin 300 Mg Caps (Gabapentin) .... Slowly Increase To 2 Tabs Three Times A Day 3)  Cetirizine Hcl 10 Mg Tabs (Cetirizine Hcl) .Marland Kitchen.. 1 Tab By Mouth Daily For Allergies 4)  Crestor 20 Mg Tabs (Rosuvastatin Calcium) .... One Half Tablet Daily For 2 Weeks, Then 1 Tablet Daily 5)  Amlodipine Besylate 10 Mg Tabs (Amlodipine Besylate) .Marland Kitchen.. 1 Tab By Mouth Daily For Blood Pressure 6)  Nortriptyline Hcl 25 Mg Caps (Nortriptyline Hcl) .Marland Kitchen.. 1 Tab By Mouth At Bedtime As Needed For Sleep 7)  Cymbalta 60 Mg Cpep (Duloxetine Hcl) 8)  Norco 5-325 Mg Tabs (Hydrocodone-Acetaminophen) .Marland Kitchen.. 1 By Mouth Q 6hrs As Needed Pain 9)  Prednisone 20 Mg Tabs (Prednisone) .... As Directed By Ortho 10)  Flexeril 10 Mg Tabs (Cyclobenzaprine Hcl) .Marland Kitchen.. 1 By Mouth Two Times A Day As Needed Spasm  Allergies (verified): 1)  Amoxicillin (Amoxicillin)  Review of Systems  per HPI  Physical Exam  General:  Obese, well groomed, pleasant.  Vital signs noted Pt sitting with leg strechted outward- awkward positing, changes positions multiple times during visit for comfort Cane at beside Ambulates with antalgic gait No foot drop noted    Impression & Recommendations:  Problem # 1:  BACK PAIN, LUMBAR, WITH RADICULOPATHY (ICD-724.4) Assessment Unchanged  No surgical intervention,. pt failed PT in 2010 unable to participate, she continues to decline, and has been doing so since 2009, unsure if she will become very functional as her mood also plays into this. Completed Functional questionaire. Started on pain contract, continue neurotin for neuroapthy associated, as needed muscle relaxant trial, encouarged movement as much as possible. Compelte steroid course The following medications were removed from the medication list:    Norco 5-325 Mg Tabs (Hydrocodone-acetaminophen) .Marland Kitchen... 1 tab by mouth two times a day as needed severe pain not  controlled with neurontin/tylenol Her updated medication list for this problem includes:    Norco 5-325 Mg Tabs (Hydrocodone-acetaminophen) .Marland Kitchen... 1 by mouth q 6hrs as needed pain    Flexeril 10 Mg Tabs (Cyclobenzaprine hcl) .Marland Kitchen... 1 by mouth two times a day as needed spasm  Orders: [facility]- Est Level  3 ([address])  Problem # 2:  HYPERTENSION, BENIGN SYSTEMIC (ICD-401.1) Assessment: Deteriorated continue current meds, likley an outlier recheck next visit Her updated medication list for this problem includes:    Hydrochlorothiazide 25 Mg Tabs (Hydrochlorothiazide) .Marland Kitchen... Take 1/2  tablet daily for blood pressure control    Amlodipine Besylate 10 Mg Tabs (Amlodipine besylate) .Marland Kitchen... 1 tab by mouth daily for blood pressure  Complete Medication List: 1)  Hydrochlorothiazide 25 Mg Tabs (Hydrochlorothiazide) .... Take 1/2  tablet daily for blood pressure control 2)  Neurontin 300 Mg Caps (Gabapentin) .... Slowly increase to 2 tabs three times a day 3)  Cetirizine Hcl 10 Mg Tabs (Cetirizine hcl) .Marland Kitchen.. 1 tab by mouth daily for allergies 4)  Crestor 20 Mg Tabs (Rosuvastatin calcium) .... One half tablet daily for 2 weeks, then 1 tablet daily 5)  Amlodipine Besylate 10 Mg Tabs (Amlodipine besylate) .Marland Kitchen.. 1 tab by mouth daily for blood pressure 6)  Nortriptyline Hcl 25 Mg Caps (Nortriptyline hcl) .Marland Kitchen.. 1 tab by mouth at bedtime as needed for sleep 7)  Cymbalta 60 Mg Cpep (Duloxetine hcl) 8)  Norco 5-325 Mg Tabs (Hydrocodone-acetaminophen) .Marland Kitchen.. 1 by mouth q 6hrs as needed pain 9)  Prednisone 20 Mg Tabs (Prednisone) .... As directed by ortho 10)  Flexeril 10 Mg Tabs (Cyclobenzaprine hcl) .Marland Kitchen.. 1 by mouth two times a day as needed spasm  Patient Instructions: 1)  Continue to take the neurotin 2)  You can try the flexeril twice a day, do not drive with this medication 3)  Continue your pain medications 4)  Try to stretch your back as previous, try heating pads 5)  Next visit in 1 month to f/u blood pressure and  pain 6)  Schedule a Mammogram Prescriptions: CYMBALTA 60 MG CPEP (DULOXETINE HCL)   #30 x 3   Entered and Authorized by:   Milinda Antis MD   Signed by:   Milinda Antis MD on 05/17/2010   Method used:   Handwritten   RxID:   [id number] NORTRIPTYLINE HCL 25 MG CAPS (NORTRIPTYLINE HCL) 1 tab by mouth at bedtime as needed for sleep  #30 x 3   Entered and Authorized by:   Milinda Antis MD   Signed by:   Milinda Antis MD on 05/17/2010  Method used:   Electronically to        [facility] [address] [phone]* (retail)       [address]       [place], [place]  [address]       Ph: [phone]       Fax: [phone]   RxID:   [id number] AMLODIPINE BESYLATE 10 MG TABS (AMLODIPINE BESYLATE) 1 tab by mouth daily for blood pressure  #90 x 3   Entered and Authorized by:   Milinda Antis MD   Signed by:   Milinda Antis MD on 05/17/2010   Method used:   Electronically to        [facility] [address] [phone]* (retail)       [address]       [place], [place]  [address]       Ph: [phone]       Fax: [phone]   RxID:   [id number] CRESTOR 20 MG TABS (ROSUVASTATIN CALCIUM) one half tablet daily for 2 weeks, then 1 tablet daily  #30 x 5   Entered and Authorized by:   Milinda Antis MD   Signed by:   Milinda Antis MD on 05/17/2010   Method used:   Electronically to        [facility] [address] [phone]* (retail)       [address]       [place], [place]  [address]       Ph: [phone]       Fax: [phone]   RxID:   [id number] CETIRIZINE HCL 10 MG TABS (CETIRIZINE HCL) 1 tab by mouth daily for allergies  #90 x 3   Entered and Authorized by:   Milinda Antis MD   Signed by:   Milinda Antis MD on 05/17/2010   Method used:   Electronically to        [facility] [address] [phone]* (retail)       [address]       [place], [place]  [address]       Ph: [phone]       Fax: [phone]   RxID:   [id number] HYDROCHLOROTHIAZIDE 25 MG  TABS (HYDROCHLOROTHIAZIDE) Take  1/2  tablet daily for blood pressure control  #30 x 6   Entered and Authorized by:   Milinda Antis MD   Signed by:   Milinda Antis MD on 05/17/2010   Method used:   Electronically to        [facility] [address] [phone]* (retail)       [address]       [place], [place]  [address]       Ph: [phone]       Fax: [phone]   RxID:   [id number] FLEXERIL 10 MG TABS (CYCLOBENZAPRINE HCL) 1 by mouth two times a day as needed spasm  #60 x 1   Entered and Authorized by:   Milinda Antis MD   Signed by:   Milinda Antis MD on 05/17/2010   Method used:   Electronically to        [facility] [address] [phone]* (retail)       [address]       [place], [place]  [address]       Ph: [phone]       Fax: [phone]   RxID:   [id number] NORCO 5-325 MG TABS (HYDROCODONE-ACETAMINOPHEN) 1 by mouth q 6hrs as needed pain  #80 x  1   Entered and Authorized by:   Milinda Antis MD   Signed by:   Milinda Antis MD on 05/17/2010   Method used:   Handwritten   RxID:   [id number]    Orders Added: 1)  [facility]- Est Level  3 [[address]]

## 2010-06-21 NOTE — Assessment & Plan Note (Signed)
Summary: f/u  bp/kh   Vital Signs:  Patient profile:   50 year old female Height:      63.5 inches Weight:      254 pounds BMI:     44.45 Temp:     98.5 degrees F oral Pulse rate:   80 / minute BP sitting:   130 / 81  (left arm)  Vitals Entered By: Tessie Fass CMA (June 15, 2010 11:16 AM) CC: F/U BP, pain Is Patient Diabetic? No   Primary Care Provider:  Delbert Harness MD  CC:  F/U BP and pain.  History of Present Illness:  F/ u with Neurosurgery March 1st , pain improved with muscle relaxant and increased dose of neurotin Completed predinose  using norco twce a day  Trying to move around more, using cane for support, understands she will never be free of pain     HTN - taking meds as prescribed, no SI, does not take BP at home  Habits & Providers  Alcohol-Tobacco-Diet     Tobacco Status: quit  Current Medications (verified): 1)  Hydrochlorothiazide 25 Mg  Tabs (Hydrochlorothiazide) .... Take 1/2  Tablet Daily For Blood Pressure Control 2)  Neurontin 300 Mg Caps (Gabapentin) .... Slowly Increase To 2 Tabs Three Times A Day 3)  Cetirizine Hcl 10 Mg Tabs (Cetirizine Hcl) .Marland Kitchen.. 1 Tab By Mouth Daily For Allergies 4)  Crestor 20 Mg Tabs (Rosuvastatin Calcium) .... One Half Tablet Daily For 2 Weeks, Then 1 Tablet Daily 5)  Amlodipine Besylate 10 Mg Tabs (Amlodipine Besylate) .Marland Kitchen.. 1 Tab By Mouth Daily For Blood Pressure 6)  Nortriptyline Hcl 25 Mg Caps (Nortriptyline Hcl) .Marland Kitchen.. 1 Tab By Mouth At Bedtime As Needed For Sleep 7)  Cymbalta 60 Mg Cpep (Duloxetine Hcl) 8)  Flexeril 10 Mg Tabs (Cyclobenzaprine Hcl) .Marland Kitchen.. 1 By Mouth Two Times A Day As Needed Spasm 9)  Norco 5-325 Mg Tabs (Hydrocodone-Acetaminophen) .Marland Kitchen.. 1 By Mouth Q 6hrs As Needed Pain 10)  Oscal 500/200 D-3 500-200 Mg-Unit Tabs (Calcium Carbonate-Vitamin D) .Marland Kitchen.. 1 By Mouth Two Times A Day  Allergies (verified): 1)  Amoxicillin (Amoxicillin)  Review of Systems       per HPI  Physical Exam  General:  Obese,  well groomed, pleasant.  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 1 by mouth two times a day as needed spasm    Norco 5-325 Mg Tabs (Hydrocodone-acetaminophen) .Marland Kitchen... 1 by mouth q 6hrs as needed pain  Orders: [facility]- Est Level  3 ([address])  Problem # 2:  HYPERTENSION, BENIGN SYSTEMIC (ICD-401.1) Assessment: Improved  No change to current regimine. Check FLP, BMET next visit Her updated medication list for this problem includes:    Hydrochlorothiazide 25 Mg Tabs (Hydrochlorothiazide) .Marland Kitchen... Take 1/2  tablet daily for blood pressure control    Amlodipine Besylate 10 Mg Tabs (Amlodipine besylate) .Marland Kitchen... 1 tab by mouth daily for blood pressure  BP today: 130/81 Prior BP: 170/108 (05/17/2010)  Prior 10 Yr Risk Heart Disease: 8 % (06/12/2007)  Labs Reviewed: K+: 3.7 (12/15/2009) Creat: : 0.92 (12/15/2009)   Chol: 227 (07/13/2008)   HDL: 38 (07/13/2008)   LDL: 166 (07/13/2008)   TG:  114 (07/13/2008)  Orders: [facility]- Est Level  3 ([address])  Complete Medication List: 1)  Hydrochlorothiazide 25 Mg Tabs (Hydrochlorothiazide) .... Take 1/2  tablet daily for blood pressure control 2)  Neurontin 300 Mg Caps (Gabapentin) .... Slowly increase to 2 tabs three times a day 3)  Cetirizine Hcl 10 Mg Tabs (Cetirizine hcl) .Marland Kitchen.. 1 tab by mouth daily for allergies 4)  Crestor 20 Mg Tabs (Rosuvastatin calcium) .... One half tablet daily for 2 weeks, then 1 tablet daily 5)  Amlodipine Besylate 10 Mg Tabs (Amlodipine besylate) .Marland Kitchen.. 1 tab by mouth daily for blood pressure 6)  Nortriptyline Hcl 25 Mg Caps (Nortriptyline hcl) .Marland Kitchen.. 1 tab by mouth at bedtime as needed  for sleep 7)  Cymbalta 60 Mg Cpep (Duloxetine hcl) 8)  Flexeril 10 Mg Tabs (Cyclobenzaprine hcl) .Marland Kitchen.. 1 by mouth two times a day as needed spasm 9)  Norco 5-325 Mg Tabs (Hydrocodone-acetaminophen) .Marland Kitchen.. 1 by mouth q 6hrs as needed pain 10)  Oscal 500/200 D-3 500-200 Mg-unit Tabs (Calcium carbonate-vitamin d) .Marland Kitchen.. 1 by mouth two times a day  Patient Instructions: 1)  Next appt in March or April 2012 for PAP Smear and labs 2)  Do not eat after midnight 3)  Start the calcium twice a day  Prescriptions: OSCAL 500/200 D-3 500-200 MG-UNIT TABS (CALCIUM CARBONATE-VITAMIN D) 1 by mouth two times a day  #60 x 6   Entered and Authorized by:   Milinda Antis MD   Signed by:   Milinda Antis MD on 06/15/2010   Method used:   Electronically to        [facility] [address] [phone]* (retail)       [address]       [place], [place]  [address]       Ph: [phone]       Fax: [phone]   RxID:   [id number]    Orders Added: 1)  [facility]- Est Level  3 [[address]]     Prevention & Chronic Care Immunizations   Influenza vaccine: Fluvax Non-MCR  (02/21/2009)   Influenza vaccine due: 06/11/2008    Tetanus booster: 12/23/2003: Done.   Tetanus booster due: 12/22/2013    Pneumococcal vaccine: Not documented  Other Screening   Pap smear: NEGATIVE FOR INTRAEPITHELIAL LESIONS OR MALIGNANCY.  (07/11/2008)   Pap smear due: 07/03/2011    Mammogram: Normal  (07/13/2008)   Mammogram action/deferral: Ordered  (09/20/2009)   Mammogram due: 07/2009   Smoking status: quit  (06/15/2010)  Lipids   Total Cholesterol: 227  (07/13/2008)   Lipid panel action/deferral: LDL Direct Ordered   LDL: 062  (07/13/2008)   LDL Direct: 195  (12/15/2009)   HDL: 38  (07/13/2008)   Triglycerides: 114  (07/13/2008)   Lipid panel due: 04/28/2009    SGOT (AST): 14  (12/15/2009)   SGPT (ALT): 11  (12/15/2009)   Alkaline phosphatase: 99  (12/15/2009)   Total bilirubin: 0.4  (12/15/2009)  Hypertension   Last Blood  Pressure: 130 / 81  (06/15/2010)   Serum creatinine: 0.92  (12/15/2009)   Serum potassium 3.7  (12/15/2009)    Hypertension flowsheet reviewed?: Yes   Progress toward BP goal: At goal  Self-Management Support :   Personal Goals (by the next clinic visit) :      Personal blood pressure goal: 140/90  (01/18/2009)     Personal LDL goal: 130  (01/27/2009)    Hypertension self-management support: Written self-care plan  (01/18/2009)    Hypertension self-management support  not done because: Good outcomes  (04/16/2010)    Lipid self-management support: Lipid monitoring log, Written self-care plan, Education handout, Pre-printed educational material, Referred for self-management class  (05/31/2009)     Lipid self-management support not done because: Good outcomes  (07/10/2009)

## 2010-06-21 NOTE — Miscellaneous (Signed)
Summary: PCP Change Request   Clinical Lists Changes  From: Abundio Miu  Sent: Tuesday, May 08, 2010 10:55 AM To: Charm Rings Subject: Kristen [name] MRN# [id number]  [name] is requesting change of provider from Dr. Earnest Bailey to Dr. Jeanice Lim because she feels that she has more of a connection with Dr. Jeanice Lim .  Is more comfortable with discussing issues and feel that she understands what's going on with her and willing to work with her to come to some resolution to her problems.  She can reached at [phone]  Discussed with Patient.  She has no concerns about Dr Leonie Green treatment but feels can talk better with Dr Jeanice Lim.   I informed [name] of our policy of allowing one PCP change and that we will change her PCP this once.   [name] agrees  Pearlean Brownie MD  May 08, 2010 2:55 PM

## 2010-06-21 NOTE — Progress Notes (Signed)
Summary: Discuss results- Neurosurgery referral   Phone Note Outgoing Call   Call placed by: Milinda Antis MD,  April 30, 2010 11:23 AM Details for Reason: MRI results Summary of Call: I spoke with Kristen [name] she is doing about the same regarding her pain, states her legs ache, no fever, no emesis, no other symptoms. Given MRI results which showed the edema at the L5-S1 at the end plate, as well DJD, arthritis and nerve compression, stenosis. Precepted with attending. Pt has seen Dr. Ciro Backer who performed surgery will need repeat visit ASAP.      Appended Document: Discuss results- Neurosurgery referral pt given red flags to seek emergent care if her status changes

## 2010-06-21 NOTE — Miscellaneous (Signed)
Summary: med record request   Medical records requested by [facility] [employer]  De Nurse  May 10, 2010 8:24 AM

## 2010-06-21 NOTE — Consult Note (Signed)
Summary: [facility]  [facility]   Imported By: De Nurse 06/04/2010 15:48:31  _____________________________________________________________________  External Attachment:    Type:   Image     Comment:   External Document

## 2010-06-21 NOTE — Progress Notes (Signed)
----   Converted from flag ---- ---- 05/31/2010 12:29 PM, Milinda Antis MD wrote: Pt seen by Neurosurgery, Dr. Ciro Backer, I need the note please ------------------------------  called [facility] for records to be faxed to [place]

## 2010-08-21 NOTE — Miscellaneous (Signed)
  Clinical Lists Changes Medical records request Received medical records request Records went to: [facility] Date sent/faxed: 06/16/09 [facility] Pysher  June 05, 2010 4:13 PM      [Prescriptions]

## 2010-08-24 LAB — POCT I-STAT, CHEM 8
BUN: 8 mg/dL (ref 6–23)
Calcium, Ion: 1.14 mmol/L (ref 1.12–1.32)
Creatinine, Ser: 1 mg/dL (ref 0.4–1.2)
Hemoglobin: 13.3 g/dL (ref 12.0–15.0)
Sodium: 139 mEq/L (ref 135–145)
TCO2: 28 mmol/L (ref 0–100)

## 2010-09-04 ENCOUNTER — Encounter: Payer: Self-pay | Admitting: Family Medicine

## 2010-09-25 ENCOUNTER — Ambulatory Visit (INDEPENDENT_AMBULATORY_CARE_PROVIDER_SITE_OTHER): Payer: Medicaid Other | Admitting: Family Medicine

## 2010-09-25 ENCOUNTER — Encounter: Payer: Self-pay | Admitting: Family Medicine

## 2010-09-25 ENCOUNTER — Other Ambulatory Visit (HOSPITAL_COMMUNITY)
Admission: RE | Admit: 2010-09-25 | Discharge: 2010-09-25 | Disposition: A | Discharge status: Home / Self Care | Payer: Medicaid Other | Source: Ambulatory Visit | Attending: Family Medicine | Admitting: Family Medicine

## 2010-09-25 DIAGNOSIS — N644 Mastodynia: Secondary | ICD-10-CM

## 2010-09-25 DIAGNOSIS — Z01419 Encounter for gynecological examination (general) (routine) without abnormal findings: Secondary | ICD-10-CM | POA: Insufficient documentation

## 2010-09-25 DIAGNOSIS — Z124 Encounter for screening for malignant neoplasm of cervix: Secondary | ICD-10-CM

## 2010-09-25 DIAGNOSIS — I10 Essential (primary) hypertension: Secondary | ICD-10-CM

## 2010-09-25 DIAGNOSIS — E785 Hyperlipidemia, unspecified: Secondary | ICD-10-CM

## 2010-09-25 DIAGNOSIS — Z0000 Encounter for general adult medical examination without abnormal findings: Secondary | ICD-10-CM

## 2010-09-25 LAB — COMPREHENSIVE METABOLIC PANEL
ALT: 10 U/L (ref 0–35)
AST: 16 U/L (ref 0–37)
BUN: 12 mg/dL (ref 6–23)
Calcium: 9.6 mg/dL (ref 8.4–10.5)
Creat: 0.9 mg/dL (ref 0.40–1.20)
Total Bilirubin: 0.4 mg/dL (ref 0.3–1.2)

## 2010-09-25 LAB — LIPID PANEL
HDL: 38 mg/dL — ABNORMAL LOW (ref >39)
Total CHOL/HDL Ratio: 7.7 Ratio
VLDL: 23 mg/dL (ref 0–40)

## 2010-09-25 MED ORDER — DULOXETINE HCL 20 MG PO CPEP: 20.0000 mg | ORAL_CAPSULE | Freq: Every day | ORAL | Status: DC

## 2010-09-25 MED ORDER — CYCLOBENZAPRINE HCL 10 MG PO TABS: 10.0000 mg | ORAL_TABLET | Freq: Two times a day (BID) | ORAL | Status: DC | PRN

## 2010-09-25 MED ORDER — GABAPENTIN 300 MG PO CAPS: 600.0000 mg | ORAL_CAPSULE | Freq: Three times a day (TID) | ORAL | Status: DC

## 2010-09-25 MED ORDER — ROSUVASTATIN CALCIUM 20 MG PO TABS: 20.0000 mg | ORAL_TABLET | Freq: Every day | ORAL | Status: DC

## 2010-09-25 MED ORDER — AMLODIPINE BESYLATE 10 MG PO TABS: 10.0000 mg | ORAL_TABLET | Freq: Every day | ORAL | Status: DC

## 2010-09-25 MED ORDER — CETIRIZINE HCL 10 MG PO TABS: 10.0000 mg | ORAL_TABLET | Freq: Every day | ORAL | Status: DC

## 2010-09-25 MED ORDER — NORTRIPTYLINE HCL 25 MG PO CAPS: 25.0000 mg | ORAL_CAPSULE | Freq: Every day | ORAL | Status: DC

## 2010-09-25 MED ORDER — HYDROCHLOROTHIAZIDE 25 MG PO TABS: ORAL_TABLET | ORAL | Status: DC

## 2010-09-25 NOTE — Progress Notes (Signed)
  Subjective:    Patient ID: Kristen [name], female    DOB: 1961/04/08, 50 y.o.   MRN: [id number]  HPI PT here for CPE   Concern- increased back and shoulder pain from breast size, 48 DDD, would like to discuss getting a breast reduction with a surgeon   Post menopausal   Has not started calcium/Vit D   Needs Mammogram       Review of Systems +back pain, no CP, NO SOB, no HA, no change in bowel/bladder, no leg weakness, no dysuria,no vag discharge     Objective:   Physical Exam  Constitutional: She is oriented to person, place, and time. She appears well-developed and well-nourished. No distress.  Neck: Neck supple.  Cardiovascular: Normal rate, regular rhythm and normal heart sounds.   No murmur heard. Pulmonary/Chest: Effort normal and breath sounds normal. No respiratory distress.  Abdominal: Soft. Bowel sounds are normal. She exhibits no distension. There is no tenderness.  Genitourinary: Vagina normal and uterus normal. No breast swelling, tenderness or discharge. No vaginal discharge found.       No CMT, pink mucousa, no ovarian masses felt, no ovarian tenderness  Breast- no masses or lesions felt, large breast  Neurological: She is alert and oriented to person, place, and time.          Assessment & Plan:    CPE- Physical performed, PAP Smear pending   UTD immunizations, pt schedule Mammogram  Back pain- upper back pain and shoulder pain likley secondary to large breast, will send to surgery for consultation, this pain is different from her lumbar disc disease  Hyperlipidemia- check FLP, CMET secondary to statin  HTN- meds not taken this AM

## 2010-09-25 NOTE — Patient Instructions (Signed)
We will send a letter with your lab results unless something is abnormal Today I will check your cholesterol , kidney and liver function Take the calcium and Vitamin D Schedule your Mammogram I will send a referral for breast reduction I have refilled all of your medications Next visit in 3 months with your New doctor

## 2010-09-26 ENCOUNTER — Telehealth: Payer: Self-pay | Admitting: Family Medicine

## 2010-09-26 ENCOUNTER — Encounter: Payer: Self-pay | Admitting: Family Medicine

## 2010-09-26 MED ORDER — ROSUVASTATIN CALCIUM 40 MG PO TABS: 40.0000 mg | ORAL_TABLET | Freq: Every day | ORAL | Status: DC

## 2010-09-26 NOTE — Telephone Encounter (Signed)
Called to discuss her cholesterol - LDL 233, up from 160's 2 years ago, TC almost 300. Pt taking crestor every other day per report. Will increase crestor to 40mg  daily. Recheck Direct LDL in 3 months Advised increased fiber, veggies, cutting back on fried foods, high saturated fats She will take 2 of her 20mg  until they run out New script sent

## 2010-09-27 ENCOUNTER — Encounter: Payer: Self-pay | Admitting: Family Medicine

## 2010-10-02 NOTE — Group Therapy Note (Signed)
A 50 year old female with at least a 1-year history of back pain.  She  had a fall in February 2009 and had back pain and bilateral lower  extremity pain which worsened in the summer of 2009.  She underwent  further workup including MRI of the lumbosacral spine which demonstrated  very large L5-S1 disk.  This was superimposed on lumbar facet  arthropathy L3-4, L4-5, L5-S1 with a minimal anterolisthesis L4 on L5.  She had a severe central stenosis at L4-5.  She underwent decompressive  laminectomy L4-5, L5-S1 levels per Dr. Juliene Pina in September 2009.  She  did not have significant relief postoperatively.  She is followed up  with orthopedics and reportedly had a followup MRI which was done at  [facility], but I do not have the results of this.  She was  told that she may have to live with the amount of pain she has.  She had  some postoperative physical therapy which was once again not helpful.  She has had no injections postoperatively or preoperatively.  The pain  is described as sharp, dull, aching, 8/10 intensity, but interferes with  activity at 2/10 level.  Her sleep is poor.  She walks with a cane since  her surgery.  Pain is worse with walking, bending, sitting, and  standing.  Improves with rest and medication.  She walk 5 minutes at a  time.  She is independent with all her self-care and mobility, but has  some pain with dressing, bathing, meal prep, household duties, and  shopping.   Other review of systems include numbness, tingling, trouble walking,  spasm, depression, weight gain, night sweats.   PAST SURGICAL HISTORY:  Carpal tunnel release in 1986 with bilateral  hand numbness which is chronic as well as tubal ligation in 1995.   SOCIAL HISTORY:  She is single, lives with her son as well as her  grandchildren.   FAMILY HISTORY:  Heart disease, high blood pressure disability.   MEDICATION LIST:  1. Celexa 40 mg per day.  2. Cetirizine 10 mg a day.  3.  Caduet 10/40 mg daily.  4. Hydrocodone 120 p.o. daily, last spills November 14, 2008 #120 and      still has 57 left.  5. Gabapentin 300 mg 1 p.o. t.i.d.  6. Hydrochlorothiazide 25 daily.   HABITS:  She smokes THC once a week.   PHYSICAL EXAMINATION:  VITAL SIGNS:  Blood pressure 123/68, pulse 77,  respirations 18, O2 sat 98% on room air.  GENERAL:  Obese female in no acute distress.  Orientation x3.  Affect is  bright and alert.  Gait is with a cane.  EXTREMITIES:  Without edema.  Coordination normal in the upper and lower  extremity.  Deep tendon reflexes are 1+ bilateral knees and ankles.  Extremities without edema.  Sensation is intact bilateral upper and  lower extremities.  Sensation, she states she cannot feel anything  anywhere that feels sharp to pinprick, but she does have light touch  sensation that is intact.   Extremities without edema.   Her back range of motion about 50% forward flexion, extension, lateral  rotation, and bending.  No directional pain.  Upper and lower extremity  strength are normal.  Range of motion is normal in the upper and lower  extremities to passive motion.   IMPRESSION:  1. Lumbar post laminectomy syndrome.  I would like to get a copy of      her postoperative MRI.  I  suspect there may be some epidural      fibrosis, certainly a reherniation is possible as well.  2. Radicular pain secondary to above.  Continue gabapentin.  3. In terms of her narcotic analgesic medications given the fact that      she admits to marijuana use, not a good candidate given that      studies have demonstrated opiate abuse or misuse in about 30-50% of      patients, who are marijuana positive.   We will set her for epidural steroid injection.  We will do left S1 as  she is more symptomatic on the left than on the right, compare result.  May need to try alternative routes should this not be particularly  helpful including caudal versus translaminar.      Erick Colace, M.D.  Electronically Signed     AEK/MedQ  D:  12/15/2008 13:19:02  T:  12/16/2008 02:36:42  Job #:  [id number]

## 2010-10-02 NOTE — Op Note (Signed)
Kristen [name], Kristen [name]           ACCOUNT NO.:  [id number]   MEDICAL RECORD NO.:  [id number]          PATIENT TYPE:  OIB   LOCATION:  1532                         FACILITY:  [facility]   PHYSICIAN:  Georges Lynch. Gioffre, M.D.DATE OF BIRTH:  [birth year]   DATE OF PROCEDURE:  01/27/2008  DATE OF DISCHARGE:                               OPERATIVE REPORT   SURGEON:  Georges Lynch. Darrelyn Hillock, M.D.   ASSISTANT .:  Jene Every, M.D.   PREOPERATIVE DIAGNOSES:  1. Severe spinal stenosis at L4-5.  2. Severe spinal stenosis at L5-S1.  3. Large herniated lumbar disk at L5-S1 on the left.   POSTOPERATIVE DIAGNOSES:  1. Severe spinal stenosis at L4-5.  2. Severe spinal stenosis at L5-S1.  3. Large herniated lumbar disk at L5-S1 on the left.   OPERATION:  1. Complete decompressive lumbar laminectomy at L4-5 for spinal      stenosis.  2. Complete lumbar laminectomy at L5-S1 for severe spinal stenosis.  3. Microdiskectomy for an extremely large herniated disk at L5-S1 on      the left.  4. Bilateral foraminotomies at two levels.   PROCEDURE IN DETAIL:  Under general anesthesia, routine orthopedic prep  and draping of the lower back was carried out.  The patient had 2 grams  IV Ancef.  Two needles were placed in the back for localization  purposes.  X-ray was taken followed by several other x-rays to  definitely localize the appropriate space.  We first went in and did a  central decompression at L4-5 and there was, the canal was extremely  tight.  The ligamentum flavum was markedly thickened at this point.  We  decompressed this level and did foraminotomies bilaterally.  We were  able then to easily pass a hockey stick proximally to make sure we were  free proximally for our proximal part of the decompression.  We then  went down distally and decompressed both recesses.  As we went distal  there really was very minimal space at L5-S1.  It was severely stenotic  so we discontinued the decompression  centrally all the way down to S1.  So we ended up doing a two level complete central decompressive lumbar  laminectomy at this point.  We then examined the nerve root on the left.  The root was extremely swollen and extremely tight.  So we had to go out  far and decompress the lateral recess first.  Once we did that we gently  brought the nerve root over, the S1 root over and made a cruciate  incision in the posterior longitudinal ligament and an extremely large  disk herniation was removed.  We made multiple passes subligamentous and  in the space and out laterally to complete the decompression.  The nerve  root now was free.  We were able to easily pass a hockey stick out the  foramen.  We did a nice foraminotomy as well.  There were no other  constrictions noted.  We thoroughly irrigated out the area and removed  all the fluid and we did use the microscope by the way.  At this time I  then injected 10 mL of FloSeal into the wound site.  Then I loosely  applied some thrombin soaked Gelfoam.  I then closed the wound in layers  in the  usual fashion.  I did leave a small proximal deep part and distal deep  part of the wound open for drainage purposes.  Subcu was closed with 0  Vicryl and the skin was closed with metal staples and a sterile  Neosporin dressing was applied.           ______________________________  Georges Lynch Darrelyn Hillock, M.D.     RAG/MEDQ  D:  01/27/2008  T:  01/28/2008  Job:  [id number]   cc:   Caralyn Guile. Ethelene Hal, M.D.  Fax: [phone]

## 2010-10-05 NOTE — Discharge Summary (Signed)
Kristen [name], Kristen [name]           ACCOUNT NO.:  [id number]   MEDICAL RECORD NO.:  [id number]          PATIENT TYPE:  INP   LOCATION:  1532                         FACILITY:  [facility]   PHYSICIAN:  Georges Lynch. Gioffre, M.D.DATE OF BIRTH:  [birth year]   DATE OF ADMISSION:  01/27/2008  DATE OF DISCHARGE:  01/29/2008                               DISCHARGE SUMMARY   ADMISSION DIAGNOSES:  1. 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  2. Large herniated disk at L5-S1 impinging left L5-S1 nerve root.  3. Hypertension.   DISCHARGE DIAGNOSES:  1. Severe spinal stenosis at L4-L5 and L5-S1 with a large herniated      disk at L5-S1 on the left, status post complete decompressive      lumbar laminectomy at L4-L5 and L5-S1 with a microdiskectomy at L5-      S1 for left-sided S1 nerve root block and bilateral foraminotomies      at 2 levels.  2. Hypertension.  3. Obesity.   HISTORY OF PRESENT ILLNESS:  The patient is a 50 year old female who  presents to [facility] for a 3 week history of left lower  leg pain.  The patient was evaluated by her primary care physician and  sent to our office for evaluation with an MRI.  It showed that she had  pseudospondylolisthesis and spinal stenosis at L4-L5 with some stenosis  at L5-S1 with a large herniated disk.  The patient had consistent  symptoms of her left lower extremities and with her findings on MRI, the  patient elected to proceed with surgical correction.   ALLERGIES:  PENICILLIN causing a rash.   MEDICATIONS ON ADMISSION:  1. Gabapentin 300 mg once a night.  2. Caduet 10/40 once a day.  3. Hydrochlorothiazide 25 mg once a day.   SURGICAL PROCEDURES:  On January 27, 2008, the patient was taken to the  OR by Dr. Ranee Gosselin assisted by Dr. Jene Every.  Under general  anesthesia, the patient underwent a complete decompressive lumbar  laminectomy at L4-L5 and L5-S1 for severe spinal stenosis.  She also had  a  microdiskectomy at L5-S1 on the left for a large herniated disk and  bilateral foraminotomies at 2 levels.  The patient tolerated the  procedure well.  There were no complications.  The patient was  transferred to the recovery room and then to the orthopedic floor in  good condition.   CONSULTATIONS:  The following routine consults were requested with  physical therapy and case management.   HOSPITAL COURSE:  On January 27, 2008, the patient was admitted to  [facility] under the care of Dr. Ranee Gosselin.  The above  mentioned surgical procedure was performed without any complications.  The patient was transferred to the recovery room and then to the  orthopedic floor in good condition on IV pain medicines and antibiotics.  Postoperative,  the patient had significantly less discomfort, but she  did have some left lower extremity numbness.  She had good muscle tone.  The patient did develop some low blood pressures postoperative, but this  was corrected with holding her p.o. medicines  and IV hydration.  The  patient otherwise did very well.  Her surgical wound remained benign for  any signs of infection.  The patient was able to get up and ambulate  with the use of a walker and physical therapy.  The patient maintained  some low numbness in the lower extremity postoperative, but had good  muscle tone and no pain, so the patient was discharged home in good  condition with followup instructions on postoperative day number 2.   LABORATORY DATA:  CBC on admission found WBC 7.2, hemoglobin 12.9,  hematocrit 40.5, platelets 255, postoperative her H&H was 12.1 and 37.3.  Routine chemistries were within normal limits with the exception of CO2  of 32.  Estimated GFR was 60, albumin 3.4.  Urinalysis was within normal  limits.   DIAGNOSTICS:  1. EKG was normal sinus rhythm at 66 beats per minute.  2. Chest x-ray on admission shows no active lung disease.   DIET:  No  restrictions.   ACTIVITY:  The patient is to ambulate with the use of a walker as  instructed by physical therapy.   WOUND CARE:  The patient is to change her dressing daily.  Home health  care physical therapy through [facility].   FOLLOW UP:  The patient has a follow up appointment with Dr. Darrelyn Hillock in  his office 2 weeks from date of surgery.  Patient is to call [phone]  for that follow up appointment.   DISCHARGE MEDICATIONS:  1. Percocet 10/650 one tablet every 4-6 hours for pain if needed.  2. Robaxin 500 mg 1 tablet every 6 hours for muscle spasms if needed.  3. Gabapentin 300 mg at bedtime.  4. Caduet 10/40 one tablet a day.  5. Hydrochlorothiazide 25 mg once a day.   CONDITION ON DISCHARGE:  Improved and good.      Jamelle Rushing, P.A.    ______________________________  Georges Lynch Darrelyn Hillock, M.D.    RWK/MEDQ  D:  02/16/2008  T:  02/16/2008  Job:  [id number]   cc:   Windy Fast A. Darrelyn Hillock, M.D.  Fax: [phone]

## 2010-10-29 ENCOUNTER — Other Ambulatory Visit: Payer: Self-pay | Admitting: Family Medicine

## 2010-10-29 NOTE — Telephone Encounter (Signed)
Refill request

## 2010-11-13 ENCOUNTER — Ambulatory Visit (INDEPENDENT_AMBULATORY_CARE_PROVIDER_SITE_OTHER): Payer: Medicaid Other | Admitting: Family Medicine

## 2010-11-13 ENCOUNTER — Other Ambulatory Visit: Payer: Self-pay | Admitting: Family Medicine

## 2010-11-13 ENCOUNTER — Encounter: Payer: Self-pay | Admitting: Family Medicine

## 2010-11-13 VITALS — BP 147/105 | HR 109 | Temp 97.1°F | Ht 64.0 in | Wt 254.0 lb

## 2010-11-13 DIAGNOSIS — M79609 Pain in unspecified limb: Secondary | ICD-10-CM

## 2010-11-13 DIAGNOSIS — M79605 Pain in left leg: Secondary | ICD-10-CM

## 2010-11-13 NOTE — Patient Instructions (Signed)
You need to get some lab work done today.  Make a right out of our parking lot, Take your first Rt on [address], Take the first left and then turn right into the medical building that has [employer] and [place] [facility]. You are going to [employer].  I will call you if there is anything we need to correct with you labs.

## 2010-11-13 NOTE — Progress Notes (Signed)
Left lateral leg pain: Pt has some left lateral leg pain that has been present for the last 1 week. She has not had any new activities, no climbing stairs, no new shoes, no strenuous exercises. She has not been kicking anything. She has good blood flow to the lower leg. No shortness of breath, no extended period of time in a car but is morbidly obese and not a very active person.   ROS; neg except for left leg pain  PE: Gen: seated comfrotbaly on the table, morbid obesity. CV: RRR, no murmur Pulm: STAB, no wheezes or crackles, normal rate of breathing.  Ext: Left lateral leg tenderness that radiates behind knee but no bulge in knee noted, neg homans signs, lateral aspect of leg feels tight bilaterally.

## 2010-11-13 NOTE — Assessment & Plan Note (Addendum)
Left lower lateral leg pain. Slight suspician for DVT given the patient is morbidly obese and has sedentary lifestyle. More likely leg pains from eletrolyte embalance or muscle strain. Will get BMET and d-dimer.

## 2010-11-14 ENCOUNTER — Encounter: Payer: Self-pay | Admitting: Family Medicine

## 2010-11-14 ENCOUNTER — Telehealth: Payer: Self-pay | Admitting: Family Medicine

## 2010-11-14 DIAGNOSIS — M79605 Pain in left leg: Secondary | ICD-10-CM

## 2010-11-14 LAB — BASIC METABOLIC PANEL
CO2: 31 mEq/L (ref 19–32)
Calcium: 9.3 mg/dL (ref 8.4–10.5)
Glucose, Bld: 84 mg/dL (ref 70–99)
Potassium: 4.3 mEq/L (ref 3.5–5.3)
Sodium: 143 mEq/L (ref 135–145)

## 2010-11-14 LAB — D-DIMER, QUANTITATIVE: D-Dimer, Quant: 0.54 ug/mL-FEU — ABNORMAL HIGH (ref 0.00–0.48)

## 2010-11-14 NOTE — Telephone Encounter (Signed)
I called pt on both numbers, left a message. I have received a disability form- I filled out this questionaire in Dec. I have attached this to the mental disability form. I have never seen Ms. Armato for any mental disabilities- she does have a history of depression and is on Celexa. She needs to come in for a visit to discuss the mental aspect. Her new PCP is Dr. Clementeen Graham , He is aware. I will also send a letter

## 2010-11-14 NOTE — Telephone Encounter (Signed)
LM on her home and mobile phone asking her to call us back and letting her know that her d-dimer was a little elevated and she needs to go to the hospital to get dopler ultrasound of her leg.  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  Will send letter as well.

## 2010-11-14 NOTE — Telephone Encounter (Signed)
Pt noted to have a D-Dimer of 0.54.  Will notify Dr. Clotilde Dieter, the patient's PCP, and discuss with the team.  Will likely contact the patient to come the the emergency department for evaluation for PE.

## 2010-11-16 ENCOUNTER — Telehealth: Payer: Self-pay | Admitting: Family Medicine

## 2010-11-16 NOTE — Telephone Encounter (Signed)
Pt is returning call about referral

## 2010-11-19 NOTE — Telephone Encounter (Signed)
Forward to white team, [place] Dec is working on this

## 2010-11-19 NOTE — Telephone Encounter (Signed)
Attempted to call patient mail box is full. Please ask which referral she is referring to? If it is the one for breast reduction because of back pain it has been denied, medicaid will not cover.Kristen [name], Rodena Medin

## 2010-11-19 NOTE — Telephone Encounter (Signed)
Pt calling again.  Please call back.

## 2010-11-19 NOTE — Telephone Encounter (Signed)
Is supposed to get vascular doppler of her leg- needs asap

## 2010-11-20 ENCOUNTER — Encounter: Payer: Self-pay | Admitting: Family Medicine

## 2010-11-20 ENCOUNTER — Other Ambulatory Visit: Payer: Self-pay | Admitting: Family Medicine

## 2010-11-20 DIAGNOSIS — M79605 Pain in left leg: Secondary | ICD-10-CM

## 2010-11-20 NOTE — Telephone Encounter (Signed)
Set up appointment for 7/5 @  2pm Heart & Vascular lab. Authorization # [id number].  Called patient and informed of this, she greed and understood appointment

## 2010-11-22 ENCOUNTER — Ambulatory Visit (HOSPITAL_COMMUNITY)
Admission: RE | Admit: 2010-11-22 | Discharge: 2010-11-22 | Disposition: A | Discharge status: Home / Self Care | Payer: Medicaid Other | Source: Ambulatory Visit | Attending: Family Medicine | Admitting: Family Medicine

## 2010-11-22 ENCOUNTER — Telehealth: Payer: Self-pay | Admitting: *Deleted

## 2010-11-22 DIAGNOSIS — M79609 Pain in unspecified limb: Secondary | ICD-10-CM

## 2010-11-22 DIAGNOSIS — M79605 Pain in left leg: Secondary | ICD-10-CM

## 2010-11-22 NOTE — Telephone Encounter (Signed)
Patient notified. She has a follow up appointment next week.

## 2010-11-22 NOTE — Telephone Encounter (Signed)
Received call form vascular lab reporting left lower venous study was negative for DVT, superficial thrombosis and also negative for Baker's Cyst.  Dr. Jeanice Lim ordered study and Dr. Denyse Amass is out of office until 11/26/2010 will forward to preceptor  . They will be faxing report.

## 2010-11-22 NOTE — Telephone Encounter (Signed)
Please let Ms Kot know that her leg venous dopplers were normal.  No blood clots.

## 2010-11-27 ENCOUNTER — Ambulatory Visit (INDEPENDENT_AMBULATORY_CARE_PROVIDER_SITE_OTHER): Payer: Medicaid Other | Admitting: Family Medicine

## 2010-11-27 ENCOUNTER — Encounter: Payer: Self-pay | Admitting: Family Medicine

## 2010-11-27 VITALS — BP 130/90 | HR 92 | Wt 253.0 lb

## 2010-11-27 DIAGNOSIS — IMO0002 Reserved for concepts with insufficient information to code with codable children: Secondary | ICD-10-CM

## 2010-11-27 NOTE — Assessment & Plan Note (Signed)
Has severe chronic pain. Is on long terms narcotics. Disability paperwork is filled out today.  I do think that her pain is worsened by her weight and overall lack of activity.  Plan: Will rtc in 3 weeks for a visit focused on ways to reduce pain and pain medication needs.  Will work on weight loss with a food diary. Additionally pt will investigate water aerobics.  Will f/u. Red flags reviewed and non found.

## 2010-11-27 NOTE — Progress Notes (Signed)
Kristen [name] presents to clinic with disability forms for assessment.  Her back pain has been present since 2009. She is mostly unable to work and therefore would like to quality for disability. She notes chronic daily back pain for which she takes vicodin. She denies any new limb weakness or bowel or bladder dysfunction. No sensory changes as well. Notes pain with sitting and standing in any one position. She also notes an inability to carry more than 10 lbs or so.   PMH reviewed.  ROS as above otherwise neg  Exam:  Vs noted.  Gen: Well NAD HEENT: EOMI, PERRL, MMM Lungs: CTABL Nl WOB Heart: RRR no MRG Abd: NABS, NT, ND Exts: Non edematous BL  LE MSK: Non-tender over entire spinal midline. BL Paraspinous tenderness especially in the lumbar spine. Normal hip and knee mobility. Uses a caine for mobility. Is able to get on and off the exam table without assistance. Is able to open doors on her own.

## 2010-11-27 NOTE — Patient Instructions (Signed)
Thank you for coming in today. Come back in 3-4 weeks for a norco refil and to get started on some weight loss and strengthen exercises.  Your homework is to write down everything you eat for the week prior to your visit.  The other assignment is to call the [place] [employer] to see about costs for water aerobics. They sometimes have scholarships. Will a doctor's note help?

## 2010-12-27 ENCOUNTER — Ambulatory Visit: Payer: Medicaid Other | Admitting: Family Medicine

## 2011-01-04 ENCOUNTER — Ambulatory Visit (INDEPENDENT_AMBULATORY_CARE_PROVIDER_SITE_OTHER): Payer: Medicaid Other | Admitting: Family Medicine

## 2011-01-04 VITALS — BP 140/80 | HR 98 | Wt 257.9 lb

## 2011-01-04 DIAGNOSIS — IMO0002 Reserved for concepts with insufficient information to code with codable children: Secondary | ICD-10-CM

## 2011-01-04 MED ORDER — CYCLOBENZAPRINE HCL 10 MG PO TABS: 10.0000 mg | ORAL_TABLET | Freq: Two times a day (BID) | ORAL | Status: DC | PRN

## 2011-01-04 MED ORDER — HYDROCODONE-ACETAMINOPHEN 5-325 MG PO TABS: 1.0000 | ORAL_TABLET | Freq: Four times a day (QID) | ORAL | Status: DC | PRN

## 2011-01-04 MED ORDER — AMLODIPINE BESYLATE 10 MG PO TABS: 10.0000 mg | ORAL_TABLET | Freq: Every day | ORAL | Status: DC

## 2011-01-04 NOTE — Assessment & Plan Note (Signed)
Spent 20 minutes discussing proper nutrition, may benefit from Dr. Gerilyn Pilgrim visit. Poor patterns of going long periods without eating and drinking a lot of sugar.

## 2011-01-04 NOTE — Patient Instructions (Signed)
Please make apt with Dr. Denyse Amass in 2 months No sugar No fast food Lots of water Eat 3 meals a day When you get a craving for sugar eat a fruit

## 2011-01-04 NOTE — Assessment & Plan Note (Signed)
Refilled hydrocodone for 2 months but reinforced that she must see primary MD for this.  Reinforced walking, better shoes for support, weight loss.

## 2011-01-04 NOTE — Progress Notes (Signed)
  Subjective:    Patient ID: Kristen [name], female    DOB: [birth year], 50 y.o.   MRN: [id number]  HPI Patient is here for narcotic refills, we discussed that this should be only done during visits with primary MD.  She was not told this when she made the apt.  She has back surgery and since then has gained a lot of weight and her back pain is not improved. She has received rehab.  She can only walk one block before the pain stops her.    She is trying to loose weight, says that she does not eat much, but endorses drinking many calories in sodas, cool-aid etc.  She has a poor understanding of nutrition.     Review of Systems  Musculoskeletal: Positive for back pain.       Objective:   Physical Exam  Constitutional:       She is morbidly obese, she is wearing worn out flip flops.  Musculoskeletal:       accentuated lumbar lordosis, pain with flexion and extension of lower back.  Psychiatric:       depressed          Assessment & Plan:

## 2011-01-18 ENCOUNTER — Telehealth: Payer: Self-pay | Admitting: Family Medicine

## 2011-01-18 NOTE — Telephone Encounter (Signed)
Needs MR's for hearing with Disability.  Needs anything pertainging to her Dx that she needs disability for.  She said anything that pertains to the changes in how things were with her before the problems started and after.  She needs them before 9/10 for her hearing if at all possible.  She is going to have her daughter, Kristen [name], pick them up.

## 2011-01-18 NOTE — Telephone Encounter (Signed)
Will fwd. To Dr.Corey .Slade, [place]  

## 2011-02-20 LAB — DIFFERENTIAL
Basophils Absolute: 0
Basophils Relative: 1
Eosinophils Absolute: 0.2
Eosinophils Relative: 3
Lymphocytes Relative: 31
Lymphs Abs: 2.2
Monocytes Absolute: 0.8
Monocytes Relative: 11
Neutro Abs: 4
Neutrophils Relative %: 55

## 2011-02-20 LAB — COMPREHENSIVE METABOLIC PANEL WITH GFR
ALT: 18
AST: 17
Albumin: 3.4 — ABNORMAL LOW
Alkaline Phosphatase: 110
BUN: 12
CO2: 33 — ABNORMAL HIGH
Calcium: 9.3
Chloride: 104
Creatinine, Ser: 0.92
GFR calc non Af Amer: >60
Glucose, Bld: 86
Potassium: 4
Sodium: 142
Total Bilirubin: 0.8
Total Protein: 6.9

## 2011-02-20 LAB — APTT: aPTT: 28

## 2011-02-20 LAB — CBC
HCT: 40.5
Hemoglobin: 12.9
MCHC: 32
MCV: 85.3
Platelets: 255
RBC: 4.75
RDW: 15.8 — ABNORMAL HIGH
WBC: 7.2

## 2011-02-20 LAB — URINALYSIS, ROUTINE W REFLEX MICROSCOPIC
Bilirubin Urine: NEGATIVE
Hgb urine dipstick: NEGATIVE
Ketones, ur: NEGATIVE
Nitrite: NEGATIVE
Specific Gravity, Urine: 1.024
Urobilinogen, UA: 1

## 2011-02-20 LAB — PROTIME-INR
INR: 1
Prothrombin Time: 12.8

## 2011-02-20 LAB — PREGNANCY, URINE: Preg Test, Ur: NEGATIVE

## 2011-02-20 LAB — HEMOGLOBIN AND HEMATOCRIT, BLOOD
HCT: 37.3
Hemoglobin: 12.1

## 2011-06-28 ENCOUNTER — Emergency Department (HOSPITAL_COMMUNITY)
Admission: EM | Admit: 2011-06-28 | Discharge: 2011-06-28 | Disposition: A | Discharge status: Home / Self Care | Payer: Medicaid Other | Attending: Emergency Medicine | Admitting: Emergency Medicine

## 2011-06-28 ENCOUNTER — Other Ambulatory Visit: Payer: Self-pay

## 2011-06-28 ENCOUNTER — Encounter (HOSPITAL_COMMUNITY): Payer: Self-pay | Admitting: *Deleted

## 2011-06-28 DIAGNOSIS — E785 Hyperlipidemia, unspecified: Secondary | ICD-10-CM | POA: Insufficient documentation

## 2011-06-28 DIAGNOSIS — R51 Headache: Secondary | ICD-10-CM | POA: Insufficient documentation

## 2011-06-28 DIAGNOSIS — H9209 Otalgia, unspecified ear: Secondary | ICD-10-CM | POA: Insufficient documentation

## 2011-06-28 DIAGNOSIS — R079 Chest pain, unspecified: Secondary | ICD-10-CM | POA: Insufficient documentation

## 2011-06-28 DIAGNOSIS — I10 Essential (primary) hypertension: Secondary | ICD-10-CM | POA: Insufficient documentation

## 2011-06-28 DIAGNOSIS — Z79899 Other long term (current) drug therapy: Secondary | ICD-10-CM | POA: Insufficient documentation

## 2011-06-28 LAB — DIFFERENTIAL
Basophils Absolute: 0 10*3/uL (ref 0.0–0.1)
Basophils Relative: 1 % (ref 0–1)
Eosinophils Absolute: 0.2 10*3/uL (ref 0.0–0.7)
Monocytes Relative: 9 % (ref 3–12)
Neutro Abs: 2.7 10*3/uL (ref 1.7–7.7)
Neutrophils Relative %: 42 % — ABNORMAL LOW (ref 43–77)

## 2011-06-28 LAB — URINALYSIS, ROUTINE W REFLEX MICROSCOPIC
Bilirubin Urine: NEGATIVE
Hgb urine dipstick: NEGATIVE
Protein, ur: NEGATIVE mg/dL
Urobilinogen, UA: 1 mg/dL (ref 0.0–1.0)

## 2011-06-28 LAB — POCT I-STAT, CHEM 8
Calcium, Ion: 1.14 mmol/L (ref 1.12–1.32)
Glucose, Bld: 106 mg/dL — ABNORMAL HIGH (ref 70–99)
HCT: 48 % — ABNORMAL HIGH (ref 36.0–46.0)
Hemoglobin: 16.3 g/dL — ABNORMAL HIGH (ref 12.0–15.0)
Potassium: 4.1 mEq/L (ref 3.5–5.1)

## 2011-06-28 LAB — CBC
MCH: 25.3 pg — ABNORMAL LOW (ref 26.0–34.0)
MCHC: 30.3 g/dL (ref 30.0–36.0)
Platelets: 280 10*3/uL (ref 150–400)
RDW: 15.7 % — ABNORMAL HIGH (ref 11.5–15.5)

## 2011-06-28 LAB — BASIC METABOLIC PANEL
Calcium: 9.3 mg/dL (ref 8.4–10.5)
GFR calc Af Amer: 58 mL/min — ABNORMAL LOW (ref >90)
GFR calc non Af Amer: 50 mL/min — ABNORMAL LOW (ref >90)
Glucose, Bld: 99 mg/dL (ref 70–99)
Sodium: 139 mEq/L (ref 135–145)

## 2011-06-28 LAB — D-DIMER, QUANTITATIVE: D-Dimer, Quant: 0.46 ug/mL-FEU (ref 0.00–0.48)

## 2011-06-28 MED ORDER — SODIUM CHLORIDE 0.9 % IV BOLUS (SEPSIS)
1000.0000 mL | Freq: Once | INTRAVENOUS | Status: AC
  Administered 2011-06-28: 1000 mL via INTRAVENOUS

## 2011-06-28 MED ORDER — SODIUM CHLORIDE 0.9 % IV BOLUS (SEPSIS): 500.0000 mL | INTRAVENOUS | Status: DC

## 2011-06-28 MED ORDER — ASPIRIN 81 MG PO CHEW
324.0000 mg | CHEWABLE_TABLET | Freq: Once | ORAL | Status: AC
  Administered 2011-06-28: 324 mg via ORAL

## 2011-06-28 MED ORDER — GI COCKTAIL ~~LOC~~
30.0000 mL | Freq: Once | ORAL | Status: AC
  Administered 2011-06-28: 30 mL via ORAL
  Filled 2011-06-28: qty 30

## 2011-06-28 MED ORDER — AZITHROMYCIN 250 MG PO TABS: ORAL_TABLET | ORAL | Status: AC

## 2011-06-28 MED ORDER — ASPIRIN 81 MG PO CHEW
CHEWABLE_TABLET | ORAL | Status: AC
  Filled 2011-06-28: qty 4

## 2011-06-28 MED ORDER — OMEPRAZOLE-SODIUM BICARBONATE 40-1100 MG PO CAPS: 1.0000 | ORAL_CAPSULE | Freq: Every day | ORAL | Status: AC

## 2011-06-28 NOTE — ED Notes (Signed)
Pt. Has a few day hx. Of c/o left ear pain that radiates to her face.  Pt. reports having a URI s/s.

## 2011-06-28 NOTE — ED Provider Notes (Signed)
51 year old female who comes in with about a one-week history of intermittent sharp, shooting pains which start in the left lateral neck and radiate up to her ear and top of her head and down into her chest. Pains are present for about 2 seconds before going away. There no associated symptoms of nausea, vomiting, diaphoresis, or dyspnea. Nothing seems to affect the pattern of the pains. She takes Norco for chronic pain and it does not seem to effect these pains. She was noted to be hypotensive in triage, and blood pressures come up with IV fluids. She also complains of generalized weakness. Her exam is unremarkable. Neck is supple and nontender, lungs are clear, heart has regular rate and rhythm without murmur, and chest is nontender. Her ECG is normal. Her only cardiac risk factor is hyperlipidemia. I do not feel that she needs any additional cardiac workup beyond the ECG and troponin. She is a patient of  [facility] and will be referred back there if her workup is negative in the ED.   ECG shows normal sinus rhythm with a rate of 81, no ectopy. Normal axis. Normal P wave. Normal QRS. Normal intervals. Normal ST and T waves. Impression: normal ECG. No old ECG available for comparison.   Dione Booze, MD 06/28/11 1038

## 2011-06-28 NOTE — ED Notes (Signed)
Pt. Just reported that she is having Chest pain and did not want to tell us.  She rates her pain 4/10, Bp 90/52

## 2011-06-28 NOTE — ED Provider Notes (Signed)
History     CSN: [id number]  Arrival date & time 06/28/11  6578   First MD Initiated Contact with Patient 06/28/11 0915      Chief Complaint  Patient presents with  . Otalgia  . Facial Pain    (Consider location/radiation/quality/duration/timing/severity/associated sxs/prior treatment) Patient is a 51 y.o. female presenting with ear pain.  Otalgia This is a new problem. The current episode started more than 1 week ago. There is pain in the left ear. The problem occurs constantly. The problem has been gradually worsening. There has been no fever. Pain scale: nagging constant pain of 4/10, spikes to 9/10 for seconds. The pain is mild. Pertinent negatives include no ear discharge, no headaches, no hearing loss, no rhinorrhea, no sore throat, no abdominal pain, no diarrhea, no vomiting, no neck pain, no cough and no rash. Her past medical history does not include chronic ear infection, hearing loss or tympanostomy tube.    Past Medical History  Diagnosis Date  . Hyperlipidemia   . Hypertension   . Back pain     Seen by Neurosurery  . Peripheral neuropathy   . Allergy   . Obesity   . Post-menopausal     History reviewed. No pertinent past surgical history.  History reviewed. No pertinent family history.  History  Substance Use Topics  . Smoking status: Former Games developer  . Smokeless tobacco: Never Used  . Alcohol Use: No    OB History    Grav Para Term Preterm Abortions TAB SAB Ect Mult Living                  Review of Systems  HENT: Positive for ear pain. Negative for hearing loss, sore throat, rhinorrhea, neck pain and ear discharge.   Respiratory: Negative for cough.   Gastrointestinal: Negative for vomiting, abdominal pain and diarrhea.  Skin: Negative for rash.  Neurological: Negative for headaches.    Allergies  Amoxicillin and Penicillins  Home Medications   Current Outpatient Rx  Name Route Sig Dispense Refill  . AMLODIPINE BESYLATE 10 MG PO TABS Oral  Take 1 tablet (10 mg total) by mouth daily. 30 tablet 6  . CETIRIZINE HCL 10 MG PO TABS Oral Take 1 tablet (10 mg total) by mouth daily. 30 tablet 3  . CYCLOBENZAPRINE HCL 10 MG PO TABS Oral Take 1 tablet (10 mg total) by mouth 2 (two) times daily as needed. 40 tablet 3  . DULOXETINE HCL 20 MG PO CPEP Oral Take 1 capsule (20 mg total) by mouth daily. 30 capsule 6  . GABAPENTIN 300 MG PO CAPS Oral Take 2 capsules (600 mg total) by mouth 3 (three) times daily. 180 capsule 6  . HYDROCODONE-ACETAMINOPHEN 5-325 MG PO TABS Oral Take 1 tablet by mouth every 6 (six) hours as needed for pain. 80 tablet 1  . NORTRIPTYLINE HCL 25 MG PO CAPS Oral Take 1 capsule (25 mg total) by mouth at bedtime. 30 capsule 6  . ROSUVASTATIN CALCIUM 40 MG PO TABS Oral Take 1 tablet (40 mg total) by mouth daily. 30 tablet 6    BP 94/50  Pulse 104  Temp(Src) 98.2 F (36.8 C) (Oral)  Resp 21  SpO2 99%  Physical Exam  Nursing note and vitals reviewed. Constitutional: She is oriented to person, place, and time. Vital signs are normal. She appears well-developed and well-nourished. She does not have a sickly appearance. No distress.  HENT:  Head: Normocephalic and atraumatic.  Right Ear: Hearing, tympanic membrane, external  ear and ear canal normal.  Left Ear: Hearing, tympanic membrane, external ear and ear canal normal.  Nose: Nose normal. No rhinorrhea.  Mouth/Throat: Uvula is midline and oropharynx is clear and moist. No oropharyngeal exudate, posterior oropharyngeal edema, posterior oropharyngeal erythema or tonsillar abscesses.       No vesicles in external auditory canal. No scaring of TM. Pt able to hear finger rub bilaterally. No mastoid tenderness  Eyes: Conjunctivae and EOM are normal. Pupils are equal, round, and reactive to light. No scleral icterus.  Neck: Normal range of motion and full passive range of motion without pain. Neck supple. Normal carotid pulses and no JVD present. Carotid bruit is not present.  No rigidity. Normal range of motion present. No Brudzinski's sign noted.  Cardiovascular: Normal rate, regular rhythm, S1 normal, S2 normal, normal heart sounds, intact distal pulses and normal pulses.  Exam reveals no gallop and no friction rub.   No murmur heard.      No pitting edema bilaterally, RRR, no aberrant sounds on auscultations, distal pulses intact, no carotid bruit or JVD.   Pulmonary/Chest: Effort normal and breath sounds normal. No accessory muscle usage or stridor. No respiratory distress. She has no wheezes. She exhibits no tenderness and no bony tenderness.  Abdominal: Bowel sounds are normal.       Soft non tender. Non pulsatile aorta.   Musculoskeletal: Normal range of motion.  Neurological: She is alert and oriented to person, place, and time. She has normal strength. No cranial nerve deficit or sensory deficit. She displays a negative Romberg sign. Coordination and gait normal. GCS eye subscore is 4. GCS verbal subscore is 5. GCS motor subscore is 6.  Skin: Skin is warm, dry and intact. No rash noted. She is not diaphoretic. No cyanosis. Nails show no clubbing.  Psychiatric: She has a normal mood and affect. Her behavior is normal.    ED Course  Procedures (including critical care time)   Labs Reviewed  CBC  DIFFERENTIAL   No results found.   No diagnosis found.  9:57 AM Pt told nurse she was having chest pain. I went to re-evaluate pt and ordered EKG troponin and cardiac monitoring. Pts labs still pending. Pt states her BP is normally high, its currently 81/43, 1 L bolus is being given. Pt is still afebrile. 324 asa given. CP has resolved and lasted about 5 minutes. Located substernal does not radiate. Denies SOB, diaphoresis, leg swelling, DOE, exertional CP. Pt does not have a cardiologist. Pt had normal treadmill test, but states it was > 61yrs.ago. Pt hx sig for hyperlipidemia, HTN, and obesity.    Date: 06/28/2011  Rate: 81  Rhythm: normal sinus rhythm  QRS  Axis: normal  Intervals: normal  ST/T Wave abnormalities: normal  Conduction Disutrbances: none  Narrative Interpretation:   Old EKG Reviewed: No significant changes noted     MDM  Atypical chest pain , Ear ache  Patient is to be discharged with recommendation to follow up with PCP in regards to today's hospital visit. Chest pain is not likely of cardiac or pulmonary etiology d/t presentation, perc negative, VSS, no tracheal deviation, no JVD or new murmur, RRR, breath sounds equal bilaterally, EKG without acute abnormalities, negative troponin, and negative troponin. Pt has been advised start a PPI and return to the ED is CP becomes exertional, associated with diaphoresis or nausea, radiates to left jaw/arm, worsens or becomes concerning in any way.Pt will be given an abx for possible ear infection. Pt  appears reliable for follow up and is agreeable to discharge.   Case has been discussed with and seen by Dr. Preston Fleeting who agrees with the above plan to discharge.          Jaci Carrel, [place] 06/28/11 1308

## 2011-06-28 NOTE — ED Provider Notes (Signed)
Medical screening examination/treatment/procedure(s) were performed by non-physician practitioner and as supervising physician I was immediately available for consultation/collaboration.   Dione Booze, MD 06/28/11 [phone]

## 2011-07-03 ENCOUNTER — Ambulatory Visit (INDEPENDENT_AMBULATORY_CARE_PROVIDER_SITE_OTHER): Payer: Medicaid Other | Admitting: Family Medicine

## 2011-07-03 VITALS — BP 130/89 | HR 84 | Temp 97.9°F | Ht 64.0 in | Wt 249.0 lb

## 2011-07-03 DIAGNOSIS — M62838 Other muscle spasm: Secondary | ICD-10-CM

## 2011-07-03 MED ORDER — MELOXICAM 15 MG PO TABS: 15.0000 mg | ORAL_TABLET | Freq: Every day | ORAL | Status: DC

## 2011-07-03 NOTE — Patient Instructions (Signed)
Thank you for coming in today. I think you have a strain of the neck muscles. Take the meloxicam in the morning for 7 days. Apply heat, and continue moving your neck. Follow the instructions below. See me in 2-4 weeks if everything is going well for your left shoulder. If you are not better in one week please come back. Come back sooner if you have new weakness in your left hand problems walking or problems with pooping and peeing.   Cervical Strain and Sprain (Whiplash) with Rehab Cervical strain and sprains are injuries that commonly occur with "whiplash" injuries. Whiplash occurs when the neck is forcefully whipped backward or forward, such as during a motor vehicle accident. The muscles, ligaments, tendons, discs and nerves of the neck are susceptible to injury when this occurs. SYMPTOMS    Pain or stiffness in the front and/or back of neck     Symptoms may present immediately or up to 24 hours after injury.     Dizziness, headache, nausea and vomiting.     Muscle spasm with soreness and stiffness in the neck.     Tenderness and swelling at the injury site.  CAUSES   Whiplash injuries often occur during contact sports or motor vehicle accidents.   RISK INCREASES WITH:  Osteoarthritis of the spine.     Situations that make head or neck accidents or trauma more likely.     High-risk sports (football, rugby, wrestling, hockey, auto racing, gymnastics, diving, contact karate or boxing).     Poor strength and flexibility of the neck.     Previous neck injury.     Poor tackling technique.     Improperly fitted or padded equipment.  PREVENTION  Learn and use proper technique (avoid tackling with the head, spearing and head-butting; use proper falling techniques to avoid landing on the head).     Warm up and stretch properly before activity.     Maintain physical fitness:     Strength, flexibility and endurance.     Cardiovascular fitness.     Wear properly fitted and  padded protective equipment, such as padded soft collars, for participation in contact sports.  PROGNOSIS   Recovery for cervical strain and sprain injuries is dependent on the extent of the injury. These injuries are usually curable in 1 week to 3 months with appropriate treatment.   RELATED COMPLICATIONS    Temporary numbness and weakness may occur if the nerve roots are damaged, and this may persist until the nerve has completely healed.     Chronic pain due to frequent recurrence of symptoms.     Prolonged healing, especially if activity is resumed too soon (before complete recovery).  TREATMENT   Treatment initially involves the use of ice and medication to help reduce pain and inflammation. It is also important to perform strengthening and stretching exercises and modify activities that worsen symptoms so the injury does not get worse. These exercises may be performed at home or with a therapist. For patients who experience severe symptoms, a soft padded collar may be recommended to be worn around the neck.   Improving your posture may help reduce symptoms. Posture improvement includes pulling your chin and abdomen in while sitting or standing. If you are sitting, sit in a firm chair with your buttocks against the back of the chair. While sleeping, try replacing your pillow with a small towel rolled to 2 inches in diameter, or use a cervical pillow or soft cervical collar. Poor sleeping  positions delay healing.   For patients with nerve root damage, which causes numbness or weakness, the use of a cervical traction apparatus may be recommended. Surgery is rarely necessary for these injuries. However, cervical strain and sprains that are present at birth (congenital) may require surgery. MEDICATION    If pain medication is necessary, nonsteroidal anti-inflammatory medications, such as aspirin and ibuprofen, or other minor pain relievers, such as acetaminophen, are often recommended.     Do not  take pain medication for 7 days before surgery.     Prescription pain relievers may be given if deemed necessary by your caregiver. Use only as directed and only as much as you need.  HEAT AND COLD:    Cold treatment (icing) relieves pain and reduces inflammation. Cold treatment should be applied for 10 to 15 minutes every 2 to 3 hours for inflammation and pain and immediately after any activity that aggravates your symptoms. Use ice packs or an ice massage.     Heat treatment may be used prior to performing the stretching and strengthening activities prescribed by your caregiver, physical therapist, or athletic trainer. Use a heat pack or a warm soak.  SEEK MEDICAL CARE IF:    Symptoms get worse or do not improve in 2 weeks despite treatment.     New, unexplained symptoms develop (drugs used in treatment may produce side effects).  EXERCISES RANGE OF MOTION (ROM) AND STRETCHING EXERCISES - Cervical Strain and Sprain These exercises may help you when beginning to rehabilitate your injury. In order to successfully resolve your symptoms, you must improve your posture. These exercises are designed to help reduce the forward-head and rounded-shoulder posture which contributes to this condition. Your symptoms may resolve with or without further involvement from your physician, physical therapist or athletic trainer. While completing these exercises, remember:    Restoring tissue flexibility helps normal motion to return to the joints. This allows healthier, less painful movement and activity.     An effective stretch should be held for at least 20 seconds, although you may need to begin with shorter hold times for comfort.     A stretch should never be painful. You should only feel a gentle lengthening or release in the stretched tissue.  STRETCH- Axial Extensors  Lie on your back on the floor. You may bend your knees for comfort. Place a rolled up hand towel or dish towel, about 2 inches in  diameter, under the part of your head that makes contact with the floor.     Gently tuck your chin, as if trying to make a "double chin," until you feel a gentle stretch at the base of your head.     Hold __________ seconds.  Repeat __________ times. Complete this exercise __________ times per day.   STRETECH - Axial Extension   Stand or sit on a firm surface. Assume a good posture: chest up, shoulders drawn back, abdominal muscles slightly tense, knees unlocked (if standing) and feet hip width apart.     Slowly retract your chin so your head slides back and your chin slightly lowers.Continue to look straight ahead.     You should feel a gentle stretch in the back of your head. Be certain not to feel an aggressive stretch since this can cause headaches later.     Hold for __________ seconds.  Repeat __________ times. Complete this exercise __________ times per day. STRETCH - Cervical Side Bend   Stand or sit on a firm surface. Assume  a good posture: chest up, shoulders drawn back, abdominal muscles slightly tense, knees unlocked (if standing) and feet hip width apart.     Without letting your nose or shoulders move, slowly tip your right / left ear to your shoulder until your feel a gentle stretch in the muscles on the opposite side of your neck.     Hold __________ seconds.  Repeat __________ times. Complete this exercise __________ times per day. STRETCH - Cervical Rotators   Stand or sit on a firm surface. Assume a good posture: chest up, shoulders drawn back, abdominal muscles slightly tense, knees unlocked (if standing) and feet hip width apart.     Keeping your eyes level with the ground, slowly turn your head until you feel a gentle stretch along the back and opposite side of your neck.     Hold __________ seconds.  Repeat __________ times. Complete this exercise __________ times per day. RANGE OF MOTION - Neck Circles   Stand or sit on a firm surface. Assume a good posture:  chest up, shoulders drawn back, abdominal muscles slightly tense, knees unlocked (if standing) and feet hip width apart.     Gently roll your head down and around from the back of one shoulder to the back of the other. The motion should never be forced or painful.     Repeat the motion 10-20 times, or until you feel the neck muscles relax and loosen.  Repeat __________ times. Complete the exercise __________ times per day. STRENGTHENING EXERCISES - Cervical Strain and Sprain These exercises may help you when beginning to rehabilitate your injury. They may resolve your symptoms with or without further involvement from your physician, physical therapist or athletic trainer. While completing these exercises, remember:    Muscles can gain both the endurance and the strength needed for everyday activities through controlled exercises.     Complete these exercises as instructed by your physician, physical therapist or athletic trainer. Progress the resistance and repetitions only as guided.     You may experience muscle soreness or fatigue, but the pain or discomfort you are trying to eliminate should never worsen during these exercises. If this pain does worsen, stop and make certain you are following the directions exactly. If the pain is still present after adjustments, discontinue the exercise until you can discuss the trouble with your clinician.  STRENGTH - Cervical Flexors, Isometric  Face a wall, standing about 6 inches away. Place a small pillow, a ball about 6-8 inches in diameter, or a folded towel between your forehead and the wall.     Slightly tuck your chin and gently push your forehead into the soft object. Push only with mild to moderate intensity, building up tension gradually. Keep your jaw and forehead relaxed.     Hold 10 to 20 seconds. Keep your breathing relaxed.     Release the tension slowly. Relax your neck muscles completely before you start the next repetition.  Repeat  __________ times. Complete this exercise __________ times per day. STRENGTH- Cervical Lateral Flexors, Isometric   Stand about 6 inches away from a wall. Place a small pillow, a ball about 6-8 inches in diameter, or a folded towel between the side of your head and the wall.     Slightly tuck your chin and gently tilt your head into the soft object. Push only with mild to moderate intensity, building up tension gradually. Keep your jaw and forehead relaxed.     Hold 10 to 20  seconds. Keep your breathing relaxed.     Release the tension slowly. Relax your neck muscles completely before you start the next repetition.  Repeat __________ times. Complete this exercise __________ times per day. STRENGTH - Cervical Extensors, Isometric   Stand about 6 inches away from a wall. Place a small pillow, a ball about 6-8 inches in diameter, or a folded towel between the back of your head and the wall.     Slightly tuck your chin and gently tilt your head back into the soft object. Push only with mild to moderate intensity, building up tension gradually. Keep your jaw and forehead relaxed.     Hold 10 to 20 seconds. Keep your breathing relaxed.     Release the tension slowly. Relax your neck muscles completely before you start the next repetition.  Repeat __________ times. Complete this exercise __________ times per day. POSTURE AND BODY MECHANICS CONSIDERATIONS - Cervical Strain and Sprain Keeping correct posture when sitting, standing or completing your activities will reduce the stress put on different body tissues, allowing injured tissues a chance to heal and limiting painful experiences. The following are general guidelines for improved posture. Your physician or physical therapist will provide you with any instructions specific to your needs. While reading these guidelines, remember:  The exercises prescribed by your provider will help you have the flexibility and strength to maintain correct  postures.     The correct posture provides the optimal environment for your joints to work. All of your joints have less wear and tear when properly supported by a spine with good posture. This means you will experience a healthier, less painful body.     Correct posture must be practiced with all of your activities, especially prolonged sitting and standing. Correct posture is as important when doing repetitive low-stress activities (typing) as it is when doing a single heavy-load activity (lifting).  PROLONGED STANDING WHILE SLIGHTLY LEANING FORWARD When completing a task that requires you to lean forward while standing in one place for a long time, place either foot up on a stationary 2-4 inch high object to help maintain the best posture. When both feet are on the ground, the low back tends to lose its slight inward curve. If this curve flattens (or becomes too large), then the back and your other joints will experience too much stress, fatigue more quickly and can cause pain.   RESTING POSITIONS Consider which positions are most painful for you when choosing a resting position. If you have pain with flexion-based activities (sitting, bending, stooping, squatting), choose a position that allows you to rest in a less flexed posture. You would want to avoid curling into a fetal position on your side. If your pain worsens with extension-based activities (prolonged standing, working overhead), avoid resting in an extended position such as sleeping on your stomach. Most people will find more comfort when they rest with their spine in a more neutral position, neither too rounded nor too arched. Lying on a non-sagging bed on your side with a pillow between your knees, or on your back with a pillow under your knees will often provide some relief. Keep in mind, being in any one position for a prolonged period of time, no matter how correct your posture, can still lead to stiffness. WALKING Walk with an upright  posture. Your ears, shoulders and hips should all line-up. OFFICE WORK When working at a desk, create an environment that supports good, upright posture. Without extra support, muscles fatigue  and lead to excessive strain on joints and other tissues. CHAIR:  A chair should be able to slide under your desk when your back makes contact with the back of the chair. This allows you to work closely.     The chair's height should allow your eyes to be level with the upper part of your monitor and your hands to be slightly lower than your elbows.     Body position:     Your feet should make contact with the floor. If this is not possible, use a foot rest.     Keep your ears over your shoulders. This will reduce stress on your neck and low back.  Document Released: 05/06/2005 Document Revised: 01/16/2011 Document Reviewed: 08/18/2008 [facility] Patient Information 2012 [place], [place].

## 2011-07-03 NOTE — Progress Notes (Signed)
Kristen [name] is a 51 y.o. female with PMHx of htn who presents to [facility] today for ongoing ear and neck pain.  She reports that she went to the ED about 1 week ago for ear pain and was given azithromycin.  She took all of the antibiotics prescribed and still has stabbing pain in her L ear.  She also reports dull pain behind her eyes, headaches, and L neck and shoulder tightness.  Denies nausea, vomiting, fever, chills, rash.  Denies sick contacts.   PMH reviewed. Significant for chronic pain and hypertension ROS as above otherwise neg Medications reviewed. Current Outpatient Prescriptions  Medication Sig Dispense Refill  . amLODipine (NORVASC) 10 MG tablet Take 1 tablet (10 mg total) by mouth daily.  30 tablet  6  . azithromycin (ZITHROMAX Z-PAK) 250 MG tablet 2 tabs PO today, then 1 tab PO days 2 through 5  6 each  0  . cetirizine (ZYRTEC) 10 MG tablet Take 1 tablet (10 mg total) by mouth daily.  30 tablet  3  . cyclobenzaprine (FLEXERIL) 10 MG tablet Take 1 tablet (10 mg total) by mouth 2 (two) times daily as needed.  40 tablet  3  . DULoxetine (CYMBALTA) 20 MG capsule Take 1 capsule (20 mg total) by mouth daily.  30 capsule  6  . gabapentin (NEURONTIN) 300 MG capsule Take 2 capsules (600 mg total) by mouth 3 (three) times daily.  180 capsule  6  . HYDROcodone-acetaminophen (NORCO) 5-325 MG per tablet Take 1 tablet by mouth every 6 (six) hours as needed for pain.  80 tablet  1  . meloxicam (MOBIC) 15 MG tablet Take 1 tablet (15 mg total) by mouth daily.  14 tablet  0  . nortriptyline (PAMELOR) 25 MG capsule Take 1 capsule (25 mg total) by mouth at bedtime.  30 capsule  6  . omeprazole-sodium bicarbonate (ZEGERID) 40-1100 MG per capsule Take 1 capsule by mouth daily before breakfast.  30 capsule  0  . rosuvastatin (CRESTOR) 40 MG tablet Take 1 tablet (40 mg total) by mouth daily.  30 tablet  6    Exam:  BP 130/89  Pulse 84  Temp(Src) 97.9 F (36.6 C) (Oral)  Ht 5\' 4"  (1.626 m)  Wt  112.946 kg (249 lb)  BMI 42.74 kg/m2 Gen: Well NAD HEENT: EOMI,  MMM, PERRL Lungs: clear to auscultation bilaterally, no crackles/wheezes Heart: RRR no MRG MSK: decreased ROM (internal/external rotation, flexion) in L shoulder, strength 4/5 in L shoulder 2/2 pain, reflexes in upper extremities intact; decreased ROM in neck demonstrated during ear-to-shoulder and when looking side to side  Assessment and Plan:  Kristen [name] is a 51 yo F with PMHx of htn who presents with ongoing ear and neck pain.  Pain has not resolved since completed course of antibiotics.  Physical exam findings such as decreased ROM in neck and left shoulder suggest impingement syndrome and muscle inflammation. 1. Take Mobic 15 mg each morning for 14 days. 2. Apply heat to affected area and continue moderate mobilization. 3. Continue all home meds. 4. At next visit will consider steroid injection into shoulder to relieve pain.  Please schedule a follow-up appointment in 2 weeks.  If in one week pain has increased or persisted, please schedule an appt.  Gerlene Fee, MS3  I was present and participated in the history and physical and agree with the above note.

## 2011-07-05 NOTE — Assessment & Plan Note (Signed)
Kristen [name] is a 51 yo F with PMHx of htn who presents with ongoing ear and neck pain.  Pain has not resolved since completed course of antibiotics.  Physical exam findings such as decreased ROM in neck and left shoulder suggest impingement syndrome and muscle inflammation. 1. Take Mobic 15 mg each morning for 14 days. 2. Apply heat to affected area and continue moderate mobilization. 3. Continue all home meds. 4. At next visit will consider steroid injection into shoulder to relieve pain.  Please schedule a follow-up appointment in 2 weeks.  If in one week pain has increased or persisted, please schedule an appt.

## 2011-07-18 ENCOUNTER — Ambulatory Visit (INDEPENDENT_AMBULATORY_CARE_PROVIDER_SITE_OTHER): Payer: Medicaid Other | Admitting: Family Medicine

## 2011-07-18 ENCOUNTER — Encounter: Payer: Self-pay | Admitting: Family Medicine

## 2011-07-18 VITALS — BP 144/97 | HR 85 | Ht 64.0 in | Wt 250.9 lb

## 2011-07-18 DIAGNOSIS — I10 Essential (primary) hypertension: Secondary | ICD-10-CM

## 2011-07-18 DIAGNOSIS — R739 Hyperglycemia, unspecified: Secondary | ICD-10-CM | POA: Insufficient documentation

## 2011-07-18 DIAGNOSIS — R7303 Prediabetes: Secondary | ICD-10-CM | POA: Insufficient documentation

## 2011-07-18 DIAGNOSIS — M62838 Other muscle spasm: Secondary | ICD-10-CM

## 2011-07-18 DIAGNOSIS — R7309 Other abnormal glucose: Secondary | ICD-10-CM

## 2011-07-18 MED ORDER — LISINOPRIL 5 MG PO TABS: 5.0000 mg | ORAL_TABLET | Freq: Every day | ORAL | Status: DC

## 2011-07-18 NOTE — Assessment & Plan Note (Signed)
Mildly hypertensive today on amlodipine. With diagnosis of prediabetes and stage III chronic kidney disease I feel that a switch to lisinopril as warranted. Plan to initiate lisinopril at 5 mg and continue amlodipine. Would like to maximize lisinopril therapy if possible. We'll check creatinine and potassium in one month.

## 2011-07-18 NOTE — Patient Instructions (Signed)
Thank you for coming in today. We will screen for diabetes with a Hb A1c (long term sugar control test).  We are adding Lisinopril for blood pressure and kidneys.  We are going to PT for your neck. We will call with the appointment.  See me in 1 month for a blood pressure and kidney check.  If you have chest pains or trouble breathing go to the ER.

## 2011-07-18 NOTE — Assessment & Plan Note (Signed)
Diagnosis of CKD stage III based on review of labs with a creatinine of 1.14 June 2011. Using GFR calculator come to a GFR of 58 which is diagnostic for stage III CKD. Initiating lisinopril therapy today

## 2011-07-18 NOTE — Assessment & Plan Note (Signed)
Diagnosis of prediabetes based upon a hemoglobin A1c of  Lab Results  Component Value Date   HGBA1C 6.1 07/18/2011   will follow with A1c every 3 months. Initiating lisinopril therapy today.

## 2011-07-18 NOTE — Assessment & Plan Note (Signed)
Neck pain likely do to neck muscle dysfunction versus spasm.  Plan to refer to physical therapy for treatment. We'll followup in one month.

## 2011-07-18 NOTE — Progress Notes (Signed)
Patient ID: Kristen [name], female   DOB: [birth year], 51 y.o.   MRN: [id number] Kristen [name] is a 51 y.o. female who presents to [facility] today for   1) hypertension: Currently taking amlodipine 10 mg. She feels well without any chest pain palpitations dizziness syncope or swelling. She does have a pertinent history of being hypotensive on amlodipine in December.  Also of note she was taking meloxicam daily until last week.    2) neck pain: Left lateral neck pain without radiculopathy or hand weakness or numbness present now for 6 months. Feeling a bit better after 2 weeks of meloxicam and Flexeril but still present.      PMH reviewed. Significant for chronic pain, lumbar spinal stenosis, hypertension  ROS as above otherwise neg Medications reviewed. Current Outpatient Prescriptions  Medication Sig Dispense Refill  . amLODipine (NORVASC) 10 MG tablet Take 1 tablet (10 mg total) by mouth daily.  30 tablet  6  . cetirizine (ZYRTEC) 10 MG tablet Take 1 tablet (10 mg total) by mouth daily.  30 tablet  3  . DULoxetine (CYMBALTA) 20 MG capsule Take 1 capsule (20 mg total) by mouth daily.  30 capsule  6  . gabapentin (NEURONTIN) 300 MG capsule Take 2 capsules (600 mg total) by mouth 3 (three) times daily.  180 capsule  6  . HYDROcodone-acetaminophen (NORCO) 5-325 MG per tablet Take 1 tablet by mouth every 6 (six) hours as needed for pain.  80 tablet  1  . nortriptyline (PAMELOR) 25 MG capsule Take 1 capsule (25 mg total) by mouth at bedtime.  30 capsule  6  . rosuvastatin (CRESTOR) 40 MG tablet Take 1 tablet (40 mg total) by mouth daily.  30 tablet  6  . lisinopril (PRINIVIL,ZESTRIL) 5 MG tablet Take 1 tablet (5 mg total) by mouth daily.  30 tablet  1    Exam:  BP 144/97  Pulse 85  Ht 5\' 4"  (1.626 m)  Wt 250 lb 14.4 oz (113.807 kg)  BMI 43.07 kg/m2 Gen: Well NAD HEENT: EOMI,  MMM Lungs: CTABL Nl WOB Heart: RRR no MRG Abd: NABS, NT, ND Exts: Non edematous BL  LE, warm and well  perfused.  Neck: Tender to palpation on left sternocleidomastoid. Normal neck range of motion normal hand sensation and strength. Upper extremity reflexes are equal bilaterally.

## 2011-07-25 ENCOUNTER — Other Ambulatory Visit: Payer: Self-pay | Admitting: Family Medicine

## 2011-07-31 ENCOUNTER — Ambulatory Visit: Payer: Medicaid Other | Attending: Family Medicine | Admitting: Physical Therapy

## 2011-08-14 ENCOUNTER — Ambulatory Visit: Payer: Medicaid Other | Admitting: Family Medicine

## 2011-09-06 ENCOUNTER — Ambulatory Visit: Payer: Medicaid Other | Admitting: Family Medicine

## 2011-09-13 ENCOUNTER — Encounter: Payer: Self-pay | Admitting: Family Medicine

## 2011-09-13 ENCOUNTER — Ambulatory Visit (INDEPENDENT_AMBULATORY_CARE_PROVIDER_SITE_OTHER): Payer: Medicare Other | Admitting: Family Medicine

## 2011-09-13 ENCOUNTER — Other Ambulatory Visit: Payer: Self-pay

## 2011-09-13 ENCOUNTER — Ambulatory Visit (HOSPITAL_COMMUNITY)
Admission: RE | Admit: 2011-09-13 | Discharge: 2011-09-13 | Disposition: A | Discharge status: Home / Self Care | Payer: Medicare Other | Source: Ambulatory Visit | Attending: Family Medicine | Admitting: Family Medicine

## 2011-09-13 VITALS — BP 168/109 | HR 80 | Ht 63.0 in | Wt 252.3 lb

## 2011-09-13 DIAGNOSIS — R079 Chest pain, unspecified: Secondary | ICD-10-CM | POA: Insufficient documentation

## 2011-09-13 DIAGNOSIS — I10 Essential (primary) hypertension: Secondary | ICD-10-CM

## 2011-09-13 DIAGNOSIS — R5383 Other fatigue: Secondary | ICD-10-CM

## 2011-09-13 DIAGNOSIS — N644 Mastodynia: Secondary | ICD-10-CM | POA: Insufficient documentation

## 2011-09-13 DIAGNOSIS — E785 Hyperlipidemia, unspecified: Secondary | ICD-10-CM

## 2011-09-13 LAB — COMPLETE METABOLIC PANEL WITH GFR
Albumin: 4 g/dL (ref 3.5–5.2)
Alkaline Phosphatase: 120 U/L — ABNORMAL HIGH (ref 39–117)
BUN: 7 mg/dL (ref 6–23)
GFR, Est African American: 82 mL/min
GFR, Est Non African American: 71 mL/min
Glucose, Bld: 84 mg/dL (ref 70–99)
Total Bilirubin: 0.4 mg/dL (ref 0.3–1.2)

## 2011-09-13 LAB — CBC
MCH: 25.9 pg — ABNORMAL LOW (ref 26.0–34.0)
Platelets: 287 10*3/uL (ref 150–400)
RBC: 5.05 MIL/uL (ref 3.87–5.11)
RDW: 16.4 % — ABNORMAL HIGH (ref 11.5–15.5)
WBC: 8.1 10*3/uL (ref 4.0–10.5)

## 2011-09-13 LAB — LDL CHOLESTEROL, DIRECT: Direct LDL: 192 mg/dL — ABNORMAL HIGH

## 2011-09-13 MED ORDER — AMLODIPINE BESYLATE 10 MG PO TABS: 10.0000 mg | ORAL_TABLET | Freq: Every day | ORAL | Status: DC

## 2011-09-13 MED ORDER — LISINOPRIL 10 MG PO TABS: 10.0000 mg | ORAL_TABLET | Freq: Every day | ORAL | Status: DC

## 2011-09-13 NOTE — Assessment & Plan Note (Signed)
Referring to health coach

## 2011-09-13 NOTE — Progress Notes (Signed)
Kristen [name] is a 51 y.o. female who presents to [facility] today for   1) hypertension: Patient has hypertension. She is currently only taking lisinopril 5 mg. She denies any trouble breathing or palpitations orthopnea or significant edema. She does note right sided brest/chest pain discussed below.  Feels well otherwise.  2) chest pain/breast pain: Patient notes stabbing right-sided breast pain that radiates to the side. She notes the pain can occur at rest but also is worse with motion. She's not sure if the pain is related to breast movement or exertion. No masses or discharge noted in the breasts. Pain is not associated with food palpitations or syncope.  3) fatigue:  For over one month now patient has been experiencing daytime sleepiness and lack of energy. She is no longer taking Cymbalta. She notes that she snores and is very tired when she wakes up in the morning. She has not had a sleep apnea evaluation.    4) weight loss: Patient is morbidly obese and is tired of being on healthy.  She is very motivated to lose weight. She notes difficulty doing this. She has tried having a diet restriction and increased exercise yet she still gains weight. Plan advised about the health coach she is interested.   PMH, SH reviewed: Significant for morbid obesity hypertension and stage III chronic kidney disease ROS as above  Medications reviewed. Current Outpatient Prescriptions  Medication Sig Dispense Refill  . gabapentin (NEURONTIN) 300 MG capsule Take 2 capsules (600 mg total) by mouth 3 (three) times daily.  180 capsule  6  . lisinopril (PRINIVIL,ZESTRIL) 10 MG tablet Take 1 tablet (10 mg total) by mouth daily.  30 tablet  6  . nortriptyline (PAMELOR) 25 MG capsule Take 1 capsule (25 mg total) by mouth at bedtime.  30 capsule  6  . rosuvastatin (CRESTOR) 40 MG tablet Take 1 tablet (40 mg total) by mouth daily.  30 tablet  6  . DISCONTD: lisinopril (PRINIVIL,ZESTRIL) 5 MG tablet Take 1 tablet (5 mg  total) by mouth daily.  30 tablet  1  . amLODipine (NORVASC) 10 MG tablet Take 1 tablet (10 mg total) by mouth daily.  30 tablet  6  . cetirizine (ZYRTEC) 10 MG tablet Take 1 tablet (10 mg total) by mouth daily.  30 tablet  3  . DULoxetine (CYMBALTA) 20 MG capsule Take 1 capsule (20 mg total) by mouth daily.  30 capsule  6  . DISCONTD: amLODipine (NORVASC) 10 MG tablet Take 1 tablet (10 mg total) by mouth daily.  30 tablet  6    Exam:  BP 168/109  Pulse 80  Ht 5\' 3"  (1.6 m)  Wt 252 lb 4.8 oz (114.443 kg)  BMI 44.69 kg/m2 Gen: Well NAD, morbidly obese. HEENT: EOMI,  MMM no goiter noted. Soft palate visible on mouth exam Lungs: CTABL Nl WOB Heart: RRR no MRG Breasts: Very large breasts without any masses noted.  No discharge Abd: NABS, NT, ND Exts: Non edematous BL  LE, warm and well perfused.   EKG: Normal sinus rhythm at 69 beats per minute. Normal intervals and segments. Normal EKG  No results found for this or any previous visit (from the past 72 hour(s)).

## 2011-09-13 NOTE — Patient Instructions (Signed)
Thank you for coming in today. Restart your BP medicine.  Come see me in 2-4 weeks.  Call or go to the emergency room if you get worse, have trouble breathing, have chest pains, or palpitations.  I will call with results.  We may need a sleep study. I will set that up as needed.

## 2011-09-13 NOTE — Assessment & Plan Note (Signed)
This is breast or chest pain.  He does not appear to be characteristic of anginal type chest pain; additionally she has a normal EKG. Plan to obtain a diagnostic mammogram and followup in 2-4 weeks. Discussed warning signs or symptoms for chest pain such as trouble breathing palpitations or worsening pain. She expresses understanding. If this continues may proceed with stress evaluation

## 2011-09-13 NOTE — Assessment & Plan Note (Signed)
Fatigue. I suspect sleep apnea however will evaluate for hypothyroidism or anemia with TSH and CBC. Followup in 2-4 weeks. Likely will  proceed with split-night sleep study.

## 2011-09-13 NOTE — Assessment & Plan Note (Signed)
Overdue for direct LDL. We'll obtain this measurement today

## 2011-09-13 NOTE — Assessment & Plan Note (Signed)
Blood pressure is elevated today. Currently only taking lisinopril 5 mg. Plan increase lisinopril to 10 mg and restart amlodipine at 10 mg. Followup in 2-4 weeks. Check basic metabolic panel today

## 2011-09-16 ENCOUNTER — Encounter: Payer: Self-pay | Admitting: Family Medicine

## 2011-09-16 ENCOUNTER — Telehealth: Payer: Self-pay | Admitting: Family Medicine

## 2011-09-16 NOTE — Telephone Encounter (Signed)
Called about test results. Left a message. Will send a letter.

## 2011-10-02 ENCOUNTER — Inpatient Hospital Stay: Admission: RE | Admit: 2011-10-02 | Payer: Medicare Other | Source: Ambulatory Visit

## 2011-10-11 ENCOUNTER — Ambulatory Visit: Payer: Medicare Other | Admitting: Family Medicine

## 2011-10-23 ENCOUNTER — Encounter: Payer: Self-pay | Admitting: Family Medicine

## 2011-10-23 ENCOUNTER — Ambulatory Visit (INDEPENDENT_AMBULATORY_CARE_PROVIDER_SITE_OTHER): Payer: Medicare Other | Admitting: Family Medicine

## 2011-10-23 VITALS — BP 176/122 | HR 96 | Ht 63.0 in | Wt 249.0 lb

## 2011-10-23 DIAGNOSIS — IMO0002 Reserved for concepts with insufficient information to code with codable children: Secondary | ICD-10-CM

## 2011-10-23 DIAGNOSIS — I10 Essential (primary) hypertension: Secondary | ICD-10-CM

## 2011-10-23 LAB — BASIC METABOLIC PANEL WITH GFR
CO2: 30 mEq/L (ref 19–32)
Calcium: 9.2 mg/dL (ref 8.4–10.5)
GFR, Est Non African American: 88 mL/min
Sodium: 142 mEq/L (ref 135–145)

## 2011-10-23 MED ORDER — LISINOPRIL 40 MG PO TABS: 40.0000 mg | ORAL_TABLET | Freq: Every day | ORAL | Status: DC

## 2011-10-23 MED ORDER — CARVEDILOL 3.125 MG PO TABS: 3.1250 mg | ORAL_TABLET | Freq: Two times a day (BID) | ORAL | Status: DC

## 2011-10-23 MED ORDER — DULOXETINE HCL 30 MG PO CPEP: 30.0000 mg | ORAL_CAPSULE | Freq: Every day | ORAL | Status: DC

## 2011-10-23 NOTE — Assessment & Plan Note (Signed)
Plan to check a basic metabolic panel today

## 2011-10-23 NOTE — Assessment & Plan Note (Signed)
Chronic back pain. Started Cymbalta 20 mg which helped some. Plan increase to 30 mg and followup in one month

## 2011-10-23 NOTE — Progress Notes (Signed)
Kristen [name] is a 51 y.o. female who presents to [facility] today for   1) hypertension: Currently taking lisinopril 10 mg and Norvasc 10 mg. Denies any chest pains, palpitations dyspnea edema.  Feels well otherwise.   2) back pain: Chronic in nature somewhat improved with starting Cymbalta.  Currently taking Cymbalta 20 mg. Denies any radiating pain weakness numbness or bowel or bladder dysfunction.  3) cough: Patient notes a new onset dry nonproductive cough about one month ago. It is somewhat bothersome and mostly occurs at night.  No fevers or chills    PMH: Reviewed significant for hypertension and morbid obesity History  Substance Use Topics  . Smoking status: Former Games developer  . Smokeless tobacco: Never Used  . Alcohol Use: No   ROS as above: Also positive for morning fatigue morning headache and not feeling well rested and snoring Medications reviewed. Current Outpatient Prescriptions  Medication Sig Dispense Refill  . amLODipine (NORVASC) 10 MG tablet Take 1 tablet (10 mg total) by mouth daily.  30 tablet  6  . gabapentin (NEURONTIN) 300 MG capsule Take 2 capsules (600 mg total) by mouth 3 (three) times daily.  180 capsule  6  . lisinopril (PRINIVIL,ZESTRIL) 40 MG tablet Take 1 tablet (40 mg total) by mouth daily.  30 tablet  6  . nortriptyline (PAMELOR) 25 MG capsule Take 1 capsule (25 mg total) by mouth at bedtime.  30 capsule  6  . rosuvastatin (CRESTOR) 40 MG tablet Take 1 tablet (40 mg total) by mouth daily.  30 tablet  6  . DISCONTD: DULoxetine (CYMBALTA) 20 MG capsule Take 1 capsule (20 mg total) by mouth daily.  30 capsule  6  . DISCONTD: lisinopril (PRINIVIL,ZESTRIL) 10 MG tablet Take 1 tablet (10 mg total) by mouth daily.  30 tablet  6  . carvedilol (COREG) 3.125 MG tablet Take 1 tablet (3.125 mg total) by mouth 2 (two) times daily with a meal.  60 tablet  3  . cetirizine (ZYRTEC) 10 MG tablet Take 1 tablet (10 mg total) by mouth daily.  30 tablet  3  . DULoxetine  (CYMBALTA) 30 MG capsule Take 1 capsule (30 mg total) by mouth daily.  30 capsule  11    Exam:  BP 176/122  Pulse 96  Ht 5\' 3"  (1.6 m)  Wt 249 lb (112.946 kg)  BMI 44.11 kg/m2 Gen: Well NAD HEENT: EOMI,  MMM Lungs: CTABL Nl WOB Heart: RRR no MRG Abd: NABS, NT, ND Exts: Non edematous BL  LE, warm and well perfused.   No results found for this or any previous visit (from the past 72 hour(s)).

## 2011-10-23 NOTE — Assessment & Plan Note (Addendum)
Chronic issue not very well controlled with lisinopril 10 and amlodipine 10.  Fortunately patient is asymptomatic.   Plan to increase lisinopril to 40 mg and add Coreg 3.125 twice daily.  Plan to check a basic metabolic panel today and followup in one month  I think that sleep apnea is likely in this patient. She is morbidly obese and has difficult to control hypertension.  Plan for a split-night sleep study.

## 2011-10-23 NOTE — Patient Instructions (Signed)
Thank you for coming in today. 1) Blood Pressure:  It is still high.  We are increasing lisinopril from 10mg  to 40mg  daily. Please start taking the new dose (or just take 4 of the old pills until they run out). We are also adding a 3rd medicine. Carvidiol 3.125mg  twice daily.   2) Cough: It may be due to lisinopril or just after a cold or allergies. Lets try to wait a few more weeks. If it does not get better we will change from lisinopril to a different class of medicine.   3) Sleep Study: We will schedule a sleep study to see if you have sleep apnea.  We will call you within 1 week for the time and place. I think this will help  4) Chronic Pain: Increasing cymbalta to 30mg  daily.   Please come back in 1 month for blood pressure and labs.   Sleep Apnea Sleep apnea is a common disorder. The main problem of this disorder is excessive daytime sleepiness and compromised quality of life. This may include social and emotional problems. There are two types of sleep apnea.  Obstructive sleep apnea is when breathing stops due to a blocked airway.   Central sleep apnea is a malfunction of the brain's normal signal to breathe.  SYMPTOMS  Restless sleep.   Falling asleep while driving and/or during the day.   Loss of energy.   Irritability.   Mood or behavior changes.   Loud, heavy snoring.   Morning headaches.   Trouble concentrating.   Forgetfulness.   Anxiety or depression.   Decreased interest in sex.  Not all people with sleep apnea have all of these symptoms. However, people who have a few of these symptoms should visit their caregiver for an evaluation. Problems related to untreated sleep apnea include:  High blood pressure (hypertension).   Coronary artery disease.   Impotence.   Cognitive dysfunction.   Memory loss.  TREATMENT  For mild cases, treatment may include avoiding sleeping on one's back.   For people with nasal congestion, a decongestant may be  prescribed.   Patients with obstructive and central apnea should avoid depressants. This includes alcohol, sedatives and narcotics. Weight loss and diet control are encouraged for overweight patients.   Many serious cases of obstructive sleep apnea can be relieved by a treatment called nasal continuous positive airway pressure (nasal CPAP). Nasal CPAP uses a mask-like device and pump that work together to keep the airway open. The pump delivers air pressure during each breath.   Surgery may help some patients by stopping or reducing the narrowing of the airway due to anatomical defects.  PROGNOSIS   Removing the obstruction usually reverses hypertension and cardiac problems. Untreated, sleep apnea sufferers have a tendency to fall asleep during the day. This is can result in serious accident or loss of ones job. RESEARCH Sleep apnea is currently one of the most active areas of sleep research.   Document Released: 04/26/2002 Document Revised: 04/25/2011 Document Reviewed: 08/22/2005 [facility] Patient Information 2012 [place], [place].

## 2011-10-23 NOTE — Assessment & Plan Note (Signed)
I think that sleep apnea is likely in this patient. She is morbidly obese and has difficult to control hypertension.  Plan for a split-night sleep study.

## 2011-10-28 ENCOUNTER — Telehealth: Payer: Self-pay | Admitting: Family Medicine

## 2011-10-28 MED ORDER — LOSARTAN POTASSIUM 100 MG PO TABS: 100.0000 mg | ORAL_TABLET | Freq: Every day | ORAL | Status: DC

## 2011-10-28 NOTE — Telephone Encounter (Signed)
Think ACEi cough likely. Will switch to losartan 100 and f/u in a few weeks.  Send note to red team to call pt.

## 2011-10-28 NOTE — Telephone Encounter (Addendum)
Will notify Dr. Denyse Amass. Text message sent.

## 2011-10-28 NOTE — Telephone Encounter (Signed)
BP pills are still making her cough - was told to call if it continued so Dr Denyse Amass can change it.  [facility] - [address]

## 2011-10-28 NOTE — Telephone Encounter (Signed)
Dr. Denyse Amass advises that he will probably change . He will address this this afternoon. Message left on voicemail for patient that we will call her back.

## 2011-10-28 NOTE — Telephone Encounter (Signed)
Called pt and informed of Dr.Corey's note. Pt agreed. Lorenda Hatchet, Renato Battles

## 2011-11-25 ENCOUNTER — Other Ambulatory Visit: Payer: Self-pay | Admitting: Family Medicine

## 2011-11-25 ENCOUNTER — Other Ambulatory Visit: Payer: Self-pay | Admitting: *Deleted

## 2011-11-25 MED ORDER — NORTRIPTYLINE HCL 25 MG PO CAPS: 25.0000 mg | ORAL_CAPSULE | Freq: Every day | ORAL | Status: DC

## 2011-11-25 MED ORDER — ROSUVASTATIN CALCIUM 40 MG PO TABS: 40.0000 mg | ORAL_TABLET | Freq: Every day | ORAL | Status: DC

## 2011-11-25 NOTE — Telephone Encounter (Signed)
Patient to be seen by doctor to reassess need for mobic

## 2011-12-06 ENCOUNTER — Ambulatory Visit (HOSPITAL_BASED_OUTPATIENT_CLINIC_OR_DEPARTMENT_OTHER): Payer: Medicare Other | Attending: Family Medicine | Admitting: Radiology

## 2011-12-06 VITALS — Ht 64.0 in | Wt 246.0 lb

## 2011-12-06 DIAGNOSIS — G4733 Obstructive sleep apnea (adult) (pediatric): Secondary | ICD-10-CM

## 2011-12-10 ENCOUNTER — Telehealth: Payer: Self-pay | Admitting: Family Medicine

## 2011-12-10 NOTE — Telephone Encounter (Signed)
Called pt.

## 2011-12-10 NOTE — Telephone Encounter (Signed)
Kristen [name] need a placard application for a handicap sticker filled out by provider and call her when ready for pickup.

## 2011-12-10 NOTE — Telephone Encounter (Signed)
Pt request a handicap sticker from her PCP. I explained to the pt, that her new PCP is Dr.Losq and in order to get a handicap sticker she needs to be evaluated. Pt became very upset about this and said: "all I am asking is to have a handicap sticker. Don't you see it in my chart? I am disabled. I don't want to go through an evaluation...." Pt would not stop talking about her situation. I asked her, if she remember who gave her a handicap sticker. She does not know. I just know, it's our policy to have pt evaluated with new PCP. Pt said, just send her the message and she can look through my chart.  I said 'I will' Fwd. To Clint Guy for advise. Kristen [name], Kristen [name]

## 2011-12-11 NOTE — Telephone Encounter (Signed)
Returned call to patient.  Informed patient that she will need office visit to meet new PCP in order for Dr. Gwenlyn Saran to verify that she continues to qualify for handicap sticker.  Also, informed patient that for legal reasons Dr. Gwenlyn Saran cannot sign form without evaluating.  Patient verbalized understanding and was scheduled for appt on 12/26/11 at 3:15pm.  Patient also had sleep study done and would like to receive results at that time.   Gaylene Brooks, RN

## 2011-12-14 DIAGNOSIS — G4733 Obstructive sleep apnea (adult) (pediatric): Secondary | ICD-10-CM

## 2011-12-15 NOTE — Procedures (Signed)
[name], Kristen [name]           ACCOUNT NO.:  [id number]  MEDICAL RECORD NO.:  [id number]          PATIENT TYPE:  OUT  LOCATION:  SLEEP CENTER                 FACILITY:  [facility]  PHYSICIAN:  Mechele Kittleson D. Maple Hudson, MD, FCCP, FACPDATE OF BIRTH:  Feb 22[birth year] 1962  DATE OF STUDY:  12/06/2011                           NOCTURNAL POLYSOMNOGRAM  REFERRING PHYSICIAN:  Paula Compton, MD  INDICATION FOR STUDY:  Hypersomnia with sleep apnea.  EPWORTH SLEEPINESS SCORE:  14/24.  BMI 42.2, weight 246 pounds, height 64 inches, neck 15 inches.  MEDICATIONS:  Home medications are charted and reviewed.  SLEEP ARCHITECTURE:  Split study protocol.  During the diagnostic phase, total sleep time 123 minutes with sleep efficiency 55.5%.  Stage I was 11.8%, stage II 61.4%, stage III absent, REM 26.8% of total sleep time. Sleep latency 14 minutes.  REM latency 170.5 minutes, awake after sleep onset 84.5 minutes.  Arousal index 20.  BEDTIME MEDICATION:  None.  RESPIRATORY DATA:  Split study protocol.  Apnea-hypopnea index (AHI) 19 per hour.  A total of 39 events was scored including 10 obstructive apneas, 2 central apneas, 27 hypopneas.  Events were associated with nonsupine sleep position.  REM AHI 47.3 per hour.  CPAP was titrated to 16 CWP, AHI 1.2 per hour.  She wore a small ResMed Quattro FX full-face mask with heated humidifier.  OXYGEN DATA:  Snoring was moderate before CPAP with oxygen desaturation to a nadir of 77% on room air.  With CPAP titration, snoring was prevented and mean oxygen saturation held 93.2% on room air.  CARDIAC DATA:  Normal sinus rhythm.  MOVEMENT-PARASOMNIA:  No significant movement disturbance.  Bathroom x2.  IMPRESSIONS-RECOMMENDATIONS: 1. Moderate obstructive sleep apnea/hypopnea syndrome, AHI 19 per hour     with non-supine events.  Moderate snoring and oxygen desaturation     to a nadir of 77% on room air. 2. Successful continuous positive airway pressure titration to 16  CWP,     AHI 1.2 per hour.  She wore a small     ResMed Quattro FX full-face mask with heated humidifier.  Snoring     was prevented and mean oxygen saturation held 93.2% on room air.     Latanza Pfefferkorn D. Maple Hudson, MD, [facility], FACP Diplomate, American Board of Sleep Medicine    CDY/MEDQ  D:  12/14/2011 14:20:22  T:  12/15/2011 05:39:36  Job:  [id number]

## 2011-12-26 ENCOUNTER — Ambulatory Visit (INDEPENDENT_AMBULATORY_CARE_PROVIDER_SITE_OTHER): Payer: Medicare Other | Admitting: Family Medicine

## 2011-12-26 ENCOUNTER — Encounter: Payer: Self-pay | Admitting: Family Medicine

## 2011-12-26 VITALS — BP 161/113 | HR 87 | Ht 64.0 in | Wt 240.0 lb

## 2011-12-26 DIAGNOSIS — M542 Cervicalgia: Secondary | ICD-10-CM | POA: Insufficient documentation

## 2011-12-26 DIAGNOSIS — G4733 Obstructive sleep apnea (adult) (pediatric): Secondary | ICD-10-CM | POA: Insufficient documentation

## 2011-12-26 DIAGNOSIS — R05 Cough: Secondary | ICD-10-CM

## 2011-12-26 DIAGNOSIS — I10 Essential (primary) hypertension: Secondary | ICD-10-CM

## 2011-12-26 DIAGNOSIS — R51 Headache: Secondary | ICD-10-CM

## 2011-12-26 MED ORDER — AMLODIPINE BESYLATE 10 MG PO TABS: 10.0000 mg | ORAL_TABLET | Freq: Every day | ORAL | Status: DC

## 2011-12-26 MED ORDER — CARVEDILOL 3.125 MG PO TABS: 3.1250 mg | ORAL_TABLET | Freq: Two times a day (BID) | ORAL | Status: DC

## 2011-12-26 MED ORDER — IBUPROFEN 600 MG PO TABS: 600.0000 mg | ORAL_TABLET | Freq: Three times a day (TID) | ORAL | Status: AC | PRN

## 2011-12-26 MED ORDER — LEVOCETIRIZINE DIHYDROCHLORIDE 5 MG PO TABS: 5.0000 mg | ORAL_TABLET | Freq: Every evening | ORAL | Status: DC

## 2011-12-26 MED ORDER — OMEPRAZOLE 20 MG PO CPDR: 20.0000 mg | DELAYED_RELEASE_CAPSULE | Freq: Every day | ORAL | Status: DC

## 2011-12-26 NOTE — Assessment & Plan Note (Signed)
Will touch base with [facility] for fitting of CPAP

## 2011-12-26 NOTE — Assessment & Plan Note (Signed)
Poorly controlled today at 161/113. Was taking losartan 100 mg daily. Will start her on amlodipine 10 mg daily as well as Coreg 3.125 mg twice daily as previously prescribed. Elevated blood pressure could be causing headache. Will followup in one week.

## 2011-12-26 NOTE — Assessment & Plan Note (Addendum)
Differential of new onset headache in a 51 year old female includes temporal arteritis, tension headaches, glaucoma, hypertension related headache. No evidence of acute ankle glaucoma. Temporal arteritis is highly differential given change in vision. Will obtain sedimentation rate. With reproducible tenderness to palpation along occiput, tension headache is also a possibility. Given blood pressure of 160/113, headache and also be related to increase in blood pressure. Will treat of tension headache with short burst of ibuprofen 600 mg every 8 hours. Reviewed creatinine which was normal. Will treat elevated blood pressure in that short burst of NSAIDs will not contribute too much to increase her blood pressure. Follow up in one week. Discussed case with Dr. Mauricio Po.

## 2011-12-26 NOTE — Progress Notes (Signed)
Patient ID: Kristen [name], female   DOB: Feb 02, 1961, 51 y.o.   MRN: [id number] Patient ID: Kristen [name]    DOB: 07/07/1960, 51 y.o.   MRN: [id number] --- Subjective:  Kristen [name] is a 51 y.o.female who presents with the following complaints: - headache: 2 weeks, left side of head, starts in her eye and radiates to the back of her neck. Some photophobia. No phonophobia. No nausea, no vomiting. No h/o migraine headaches. Some blurred vision in that eye with the pain. Pain lasts a few hours, occurs daily. She has not taken any medicaiton for it.  - hypertension: takes losartan 100mg  daily. Does not take norvasc or coreg as listed in MR. - chronic cough: 47month 1/2. Non productive, violent to the point of making her throw up (non bloody non bilious phlegm). No fever, no congestion. Does have some acid reflux. Doesn't take anything for it - follow up on sleep study results: moderate sleep apnea with recommendation for CPAP.   ROS: see HPI Past Medical History: reviewed and updated medications and allergies. Social History: Tobacco: denies  Objective: Filed Vitals:   12/26/11 1542  BP: 161/113  Pulse: 87    Physical Examination:   General appearance - alert, well appearing, and in mild  distress once headache starts.  Nose - congested and erythematous nasal turbinates Mouth - mucous membranes moist, pharynx normal without lesions Head - tenderness to palpation along left occipital side, no tenderness along temporal aspects Neuro - CN2-12 normal. No injected conjunctiva Neck - supple, no significant adenopathy Chest - clear to auscultation, no wheezes, rales or rhonchi, symmetric air entry Heart - normal rate, regular rhythm, normal S1, S2, no murmurs, rubs, clicks or gallops Abdomen - soft, nontender, nondistended, no masses or organomegaly Extremities - peripheral pulses normal, no pedal edema, no clubbing or cyanosis

## 2011-12-26 NOTE — Assessment & Plan Note (Signed)
Subacute in nature given less than 8 weeks in duration. Could be post viral. Could also be related to GERD or allergic rhinitis. Will treat and monitor for resolution of symptoms with Prilosec 20 and antihistamine. Currently on an ARB not an ACE. Followup in one week.

## 2011-12-26 NOTE — Patient Instructions (Signed)
For the headache, I am going to check a lab test to make sure it is not something called temporal arteritis. For the pain, take ibuprofen 600mg  every 6 hours as needed for pain. I'd like to see you back in 1 week.  For the blood pressure, take coreg and amlodipine and losartan.  For the cough, we'll start you on prilosec and zyrtec.

## 2011-12-27 ENCOUNTER — Telehealth: Payer: Self-pay | Admitting: Family Medicine

## 2011-12-27 LAB — SEDIMENTATION RATE: Sed Rate: 9 mm/hr (ref 0–22)

## 2011-12-27 NOTE — Telephone Encounter (Signed)
Forward to PCP, patient requesting hadicap sticker. I don't believe this discussed during the office visit.Kristen [name], Kristen [name]

## 2011-12-27 NOTE — Telephone Encounter (Signed)
Is asking for handicap sticker - pls call when ready - forgot to get it yesterday at visit

## 2011-12-30 ENCOUNTER — Telehealth: Payer: Self-pay | Admitting: Family Medicine

## 2011-12-30 NOTE — Telephone Encounter (Signed)
Would prefer to discuss the handicap sticker in person with patient. She has a follow up appointment soon in august and can follow up with me about it then.

## 2011-12-31 ENCOUNTER — Other Ambulatory Visit: Payer: Self-pay | Admitting: Family Medicine

## 2011-12-31 DIAGNOSIS — G4733 Obstructive sleep apnea (adult) (pediatric): Secondary | ICD-10-CM

## 2011-12-31 NOTE — Telephone Encounter (Signed)
Patient has an appointment on Monday the 19th, she can discuss the handicap sticker at that visit.Harace Mccluney, Rodena Medin

## 2012-01-03 ENCOUNTER — Other Ambulatory Visit: Payer: Self-pay | Admitting: Family Medicine

## 2012-01-03 DIAGNOSIS — G4733 Obstructive sleep apnea (adult) (pediatric): Secondary | ICD-10-CM

## 2012-01-06 ENCOUNTER — Encounter: Payer: Self-pay | Admitting: Family Medicine

## 2012-01-06 ENCOUNTER — Ambulatory Visit (INDEPENDENT_AMBULATORY_CARE_PROVIDER_SITE_OTHER): Payer: Medicare Other | Admitting: Family Medicine

## 2012-01-06 VITALS — BP 133/88 | HR 79 | Ht 64.0 in | Wt 246.0 lb

## 2012-01-06 DIAGNOSIS — R053 Chronic cough: Secondary | ICD-10-CM

## 2012-01-06 DIAGNOSIS — M542 Cervicalgia: Secondary | ICD-10-CM

## 2012-01-06 DIAGNOSIS — R059 Cough, unspecified: Secondary | ICD-10-CM

## 2012-01-06 DIAGNOSIS — R51 Headache: Secondary | ICD-10-CM

## 2012-01-06 DIAGNOSIS — I10 Essential (primary) hypertension: Secondary | ICD-10-CM

## 2012-01-06 DIAGNOSIS — R05 Cough: Secondary | ICD-10-CM

## 2012-01-06 MED ORDER — HYDROCODONE-ACETAMINOPHEN 5-500 MG PO TABS: 1.0000 | ORAL_TABLET | Freq: Three times a day (TID) | ORAL | Status: DC | PRN

## 2012-01-06 MED ORDER — GABAPENTIN 300 MG PO CAPS: 300.0000 mg | ORAL_CAPSULE | Freq: Every day | ORAL | Status: DC

## 2012-01-06 NOTE — Assessment & Plan Note (Signed)
Improved compared to previous. 133/88. Continue amlodipine losartan and carvedilol.

## 2012-01-06 NOTE — Assessment & Plan Note (Signed)
Chronic cough for 3 months. Symptoms not really relieved with acid reducer and antihistamine. Will therefore get chest x-ray to rule out any chronic infection or other process.

## 2012-01-06 NOTE — Progress Notes (Signed)
Patient ID: THERESA WEDEL, female   DOB: 28-May-1960, 51 y.o.   MRN: [id number] Patient ID: LERIN JECH    DOB: 1960/12/24, 51 y.o.   MRN: [id number] --- Subjective:  Nastassia is a 51 y.o.female who presents for followup on headache and blood pressure. - Headache: Not as bad as last time she was seen diet still very much present. Headache has been going on for 4 months now. Worse with turning her head from side to side, worse with laying down. Pain starts in her upper back along her left neck and travels to the right side of her face. No weakness, no urinary or bowel incontinence, only urgency. Ibuprofen 800 mg twice a day has not been helping much. Sedimentation rate obtained for rule out temporal arteritis was normal. Complains of numbness and tingling in her hands that have been going on for one year off and on, not worsening in nature. 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 She is concerned that this was happening in her neck. -High blood pressure: Has been taking amlodipine, losartan, carvedilol. No chest pain, no lightheadedness, no increased swelling in her lower extremities. -Cough: Has been taking Zyrtec and acid reducer without true resolution of cough. She states cough is more productive now that she's been taking Zyrtec. No shortness of breath, no rhinorrhea. No fevers or chills. Cough x3 months  ROS: see HPI Past Medical History: reviewed and updated medications and allergies. Social History: Tobacco: Denies  Objective: Filed Vitals:   01/06/12 1032  BP: 133/88  Pulse: 79    Physical Examination:   General appearance - alert, well appearing, and in no distress Ears - bilateral TM's and external ear canals normal Nose - mildly congested and erythematous nasal turbinates bilaterally. Mouth - mucous membranes moist, pharynx normal without lesions Neck - reproducible pain with movement of neck from side to  side, no significant adenopathy Chest - clear to auscultation, no wheezes, rales or rhonchi, symmetric air entry Heart - normal rate, regular rhythm, normal S1, S2, no murmurs, rubs, clicks or gallops Extremities - peripheral pulses normal, no pedal edema, no clubbing or cyanosis Back-tenderness to palpation along the cervical spine and left cervical paraspinal muscles. Neuro-4/5 strength in deltoid, biceps and triceps bilaterally, 3/5 and grip bilaterally. Sensation to light touch intact in upper extremities bilaterally.

## 2012-01-06 NOTE — Assessment & Plan Note (Signed)
Rule out temporal arteritis with normal sedimentation rate. Blood pressure now controlled and less likely to be contributing to this headache. Headaches still present with 800 mg of ibuprofen twice a day. Given neck pain, headache could very likely be from referred pain from cervical ruptured disc. Will start on MRI given the lack of focal neurological signs. Will obtain cervical spine x-ray to rule out any lytic lesion or evidence of infection. Treat pain with Vicodin 5 500. Patient mentions that she took tramadol in the past which did not work for her. Will also increase her gabapentin at nighttime from 600 mg to 900 mg given that it makes her drowsy. Will increase to 1200 mg at nighttime if patient tolerates current dose. Continue 600 mg in the morning at noon. Patient to return in one month. If new onset weakness or loss of urine or bowel patient knows to come to the emergency room. If pain not improved in a month will obtain MRI.

## 2012-01-06 NOTE — Patient Instructions (Addendum)
If you start having more pain, not controled with the medicine, or start having more weakness, or loosing bowel or urine, come back to the clinic or the ED.  For the gabapentin, take 600mg  in the morning, at noon and take 900mg  in the evening. If the pain is not better in a month, we'll get an MRI. For now, we'll get a plain film of your neck.   I am also getting a chest xray since you're coughing for so long.   I'll see you back in 1 month.

## 2012-01-13 ENCOUNTER — Ambulatory Visit
Admission: RE | Admit: 2012-01-13 | Discharge: 2012-01-13 | Disposition: A | Discharge status: Home / Self Care | Payer: Medicare Other | Source: Ambulatory Visit | Attending: Family Medicine | Admitting: Family Medicine

## 2012-01-13 ENCOUNTER — Encounter: Payer: Self-pay | Admitting: *Deleted

## 2012-01-13 DIAGNOSIS — M542 Cervicalgia: Secondary | ICD-10-CM

## 2012-01-13 DIAGNOSIS — R05 Cough: Secondary | ICD-10-CM

## 2012-01-13 NOTE — Telephone Encounter (Signed)
This encounter was created in error - please disregard.

## 2012-02-10 ENCOUNTER — Ambulatory Visit: Payer: Medicare Other | Admitting: Family Medicine

## 2012-02-17 ENCOUNTER — Ambulatory Visit (INDEPENDENT_AMBULATORY_CARE_PROVIDER_SITE_OTHER): Payer: Medicare Other | Admitting: Family Medicine

## 2012-02-17 ENCOUNTER — Encounter: Payer: Self-pay | Admitting: Family Medicine

## 2012-02-17 VITALS — BP 139/88 | HR 80 | Temp 98.2°F | Ht 63.0 in | Wt 240.9 lb

## 2012-02-17 DIAGNOSIS — R05 Cough: Secondary | ICD-10-CM

## 2012-02-17 DIAGNOSIS — R51 Headache: Secondary | ICD-10-CM

## 2012-02-17 DIAGNOSIS — Z23 Encounter for immunization: Secondary | ICD-10-CM

## 2012-02-17 DIAGNOSIS — M47812 Spondylosis without myelopathy or radiculopathy, cervical region: Secondary | ICD-10-CM

## 2012-02-17 MED ORDER — IBUPROFEN 600 MG PO TABS: 600.0000 mg | ORAL_TABLET | Freq: Four times a day (QID) | ORAL | Status: DC | PRN

## 2012-02-17 MED ORDER — FLUTICASONE PROPIONATE 50 MCG/ACT NA SUSP: 2.0000 | Freq: Every day | NASAL | Status: DC

## 2012-02-17 MED ORDER — HYDROCODONE-ACETAMINOPHEN 5-500 MG PO TABS: 1.0000 | ORAL_TABLET | Freq: Two times a day (BID) | ORAL | Status: AC

## 2012-02-17 NOTE — Progress Notes (Signed)
Patient ID: Kristen [name], female   DOB: 1960/07/16, 51 y.o.   MRN: [id number] Patient ID: Kristen [name]    DOB: March 27, 1961, 51 y.o.   MRN: [id number] --- Subjective:  Kristen [name] is a 51 y.o.female with h/o HTN, obesity, HLD, chronic back pain who presents for follow up on cough and neck pain. - cough: improved although does have some phlegm productive cough still x3 weeks. Worst at night. No fever. No shortness of breath. Has been taking xyzal and prilosec. CXR done on 01/13/12 was normal.  - neck pain: still present. vicodin and ibuprofen bring pain down from 7 to 4. Has some numbness in the whole left hand. No loss of sensation, no weakness in arms. Cervical spine xray showed mild hypertrophy and facet sclerosis of the mid to lower C spine as well as minimal spondylosis in C5-C6.  - exercise and diet: has been trying to walk more. Also has been eating more vegetables. Drinks: [employer], not sure if it is diet. Otherwise: water.   ROS: see HPI Past Medical History: reviewed and updated medications and allergies. Social History: Tobacco: denies   Objective: Filed Vitals:   02/17/12 1541  BP: 139/88  Pulse: 80  Temp: 98.2 F (36.8 C)    Physical Examination:   General appearance - alert, well appearing, and in no distress Nose - edematous and erythematous nasal turbinates bilaterally Mouth - mucous membranes moist, pharynx normal without lesions, cobblestone pattern in posterior oropharynx.  Neck - supple, no significant adenopathy Chest - clear to auscultation, no wheezes, rales or rhonchi, symmetric air entry Heart - normal rate, regular rhythm, normal S1, S2, no murmurs, rubs, clicks or gallops Abdomen - soft, nontender, nondistended, no masses or organomegaly Extremities - peripheral pulses normal, no pedal edema NEck - no tenderness to palpation along cervical spine, positive spurling's on left, no loss of sensation in hands bilaterally, normal range of motion of shoulder  b/l, 4/5 strength in hand gip, biceps and triceps and deltoid bilaterally. Phallen test: positive. Tinnel: negative

## 2012-02-17 NOTE — Patient Instructions (Addendum)
For the medicine, increase the gabapentin to 900 at night time for 1 week and then if tolerated, increase to 1200mg  at night time. If you tolerate that, 1 week later, take 600mg  instead of 300mg  at noon.

## 2012-02-17 NOTE — Assessment & Plan Note (Signed)
6 lb weight loss since last visit. Encouraged patient in continuing her current efforts and praised her for her success. Recommended that patient cut out any sweet drinks in diet.

## 2012-02-17 NOTE — Assessment & Plan Note (Signed)
Improving. cxr normal. Chronic in nature since has been present for over 3 months. Continue priloxec and xyzal. Will add flonase since cough could be due to post nasal drip from uncontroled seasonal allergies. Will therefore treat allergies with flonase. If persists, will obtain PFT's.

## 2012-02-17 NOTE — Assessment & Plan Note (Addendum)
Likely from cervical etiology. Given persistent symptoms, will obtain MRI for further characterization. No evidence of focal neuro deficits. Hand paresthesias are likely from local carperl tunnel etiology given positive phallen test.  Will also refer to PT.  For pain, will increase gabapentin. See avs for increase in dosing plan. Will also wean off vicodin to twice daily. Patient agreable to this. Continue ibuprofen 600mg  q8.

## 2012-02-21 ENCOUNTER — Other Ambulatory Visit: Payer: Medicare Other

## 2012-02-22 ENCOUNTER — Ambulatory Visit
Admission: RE | Admit: 2012-02-22 | Discharge: 2012-02-22 | Disposition: A | Discharge status: Home / Self Care | Payer: Medicare Other | Source: Ambulatory Visit | Attending: Family Medicine | Admitting: Family Medicine

## 2012-02-22 DIAGNOSIS — M47812 Spondylosis without myelopathy or radiculopathy, cervical region: Secondary | ICD-10-CM

## 2012-02-28 ENCOUNTER — Encounter: Payer: Self-pay | Admitting: Family Medicine

## 2012-02-28 ENCOUNTER — Ambulatory Visit (INDEPENDENT_AMBULATORY_CARE_PROVIDER_SITE_OTHER): Payer: Medicare Other | Admitting: Family Medicine

## 2012-02-28 VITALS — BP 139/82 | HR 84 | Ht 63.0 in | Wt 240.0 lb

## 2012-02-28 DIAGNOSIS — M542 Cervicalgia: Secondary | ICD-10-CM

## 2012-02-28 NOTE — Patient Instructions (Addendum)
The questions to ask the surgeon when you see him are the following: - is this a need for surgery? If so, what kind? What would be the benefits? - could I get injections for the pain? - would physical therapy alone be helpful?  - would the narrowing become smaller over time?

## 2012-02-29 NOTE — Assessment & Plan Note (Addendum)
Reviewed MRI results with patient showing her the images. Cervical MRI from 02/22/12: significant for Spondylosis and a right foraminal disc osteophyte complex at C5-  C6 contributing to mild cord flattening and moderate right foraminal stenosis.  Recommended that patient make appointment with surgeon. She reports having been seen by one in the past for her lumbar back pain and would like to try to go back there. Continue current medical therapy: gabapentin 900mg  qhs, vicodin as needed, ibuprofen 600mg  q6/prn Will resend referral for physical therapy as patient reports not having heard back.

## 2012-02-29 NOTE — Progress Notes (Signed)
Patient ID: SAVANNAHA STONEROCK, female   DOB: Apr 15, 1961, 51 y.o.   MRN: [id number] Patient ID: DONDI BURANDT    DOB: 1960-08-17, 51 y.o.   MRN: [id number] --- Subjective:  Christiann is a 51 y.o.female with h/o HTN, obesity, HLD, chronic back pain who presents for follow up neck MRI results.  - neck pain: 6/10. unchanged in location and nature: starts in back of neck and shoots to shoulder and side of face on left side. Denies any loss of sensation in arms, denies any weakness in arms, any numbness, does have some occasional tingling in left hand. Has been taking vicodin once a day at most. Has been taking gabapentin 900mg  at night which has helped. She cannot take any during the day, as they make her too drowsy. She has not had any major problems taking that dose at night time.  - cervical MRI from 02/22/12: significant for Spondylosis and a right foraminal disc osteophyte complex at C5-  C6 contributing to mild cord flattening and moderate right foraminal stenosis.  ROS: see HPI Past Medical History: reviewed and updated medications and allergies. Social History: Tobacco: denies   Objective: Filed Vitals:   02/28/12 1502  BP: 139/82  Pulse: 84    Physical Examination:   General appearance - alert, well appearing, and in no distress Chest - clear to auscultation, no wheezes, rales or rhonchi, symmetric air entry Heart - normal rate, regular rhythm, normal S1, S2, no murmurs, rubs, clicks or gallops Abdomen - soft, nontender, nondistended, no masses or organomegaly Extremities - peripheral pulses normal, no pedal edema NEck - tenderness to palpation along trapezius bilaterally as well as along cervical spine, no loss of sensation in hands bilaterally, normal range of motion of shoulder b/l, 4/5 strength in hand gip, biceps and triceps and deltoid bilaterally.

## 2012-03-04 ENCOUNTER — Ambulatory Visit: Payer: Medicare Other | Attending: Family Medicine | Admitting: Physical Therapy

## 2012-03-04 DIAGNOSIS — IMO0001 Reserved for inherently not codable concepts without codable children: Secondary | ICD-10-CM | POA: Insufficient documentation

## 2012-03-04 DIAGNOSIS — R293 Abnormal posture: Secondary | ICD-10-CM | POA: Insufficient documentation

## 2012-03-04 DIAGNOSIS — M542 Cervicalgia: Secondary | ICD-10-CM | POA: Insufficient documentation

## 2012-03-12 ENCOUNTER — Ambulatory Visit: Payer: Medicare Other | Admitting: Physical Therapy

## 2012-03-13 ENCOUNTER — Ambulatory Visit: Payer: Medicare Other | Admitting: Physical Therapy

## 2012-03-17 ENCOUNTER — Ambulatory Visit: Payer: Medicare Other | Admitting: Physical Therapy

## 2012-03-19 ENCOUNTER — Ambulatory Visit: Payer: Medicare Other | Admitting: Rehabilitation

## 2012-03-23 ENCOUNTER — Ambulatory Visit: Payer: Medicare Other | Attending: Family Medicine | Admitting: Rehabilitation

## 2012-03-23 DIAGNOSIS — M542 Cervicalgia: Secondary | ICD-10-CM | POA: Insufficient documentation

## 2012-03-23 DIAGNOSIS — IMO0001 Reserved for inherently not codable concepts without codable children: Secondary | ICD-10-CM | POA: Insufficient documentation

## 2012-03-23 DIAGNOSIS — R293 Abnormal posture: Secondary | ICD-10-CM | POA: Insufficient documentation

## 2012-03-25 ENCOUNTER — Ambulatory Visit: Payer: Medicare Other | Admitting: Rehabilitation

## 2012-03-31 ENCOUNTER — Encounter: Payer: Medicare Other | Admitting: Physical Therapy

## 2012-04-02 ENCOUNTER — Encounter: Payer: Medicare Other | Admitting: Rehabilitation

## 2012-07-17 ENCOUNTER — Other Ambulatory Visit: Payer: Self-pay | Admitting: *Deleted

## 2012-07-17 MED ORDER — IBUPROFEN 600 MG PO TABS: 600.0000 mg | ORAL_TABLET | Freq: Four times a day (QID) | ORAL | Status: DC | PRN

## 2012-08-05 ENCOUNTER — Ambulatory Visit (INDEPENDENT_AMBULATORY_CARE_PROVIDER_SITE_OTHER): Payer: Medicare Other | Admitting: Family Medicine

## 2012-08-05 ENCOUNTER — Encounter: Payer: Self-pay | Admitting: Family Medicine

## 2012-08-05 VITALS — BP 147/94 | HR 85 | Temp 98.4°F | Ht 63.0 in | Wt 232.0 lb

## 2012-08-05 DIAGNOSIS — R51 Headache: Secondary | ICD-10-CM

## 2012-08-05 DIAGNOSIS — I10 Essential (primary) hypertension: Secondary | ICD-10-CM

## 2012-08-05 MED ORDER — PROMETHAZINE HCL 25 MG/ML IJ SOLN
25.0000 mg | Freq: Once | INTRAMUSCULAR | Status: AC
  Administered 2012-08-05: 25 mg via INTRAMUSCULAR

## 2012-08-05 MED ORDER — PROMETHAZINE HCL 25 MG/ML IJ SOLN: 25.0000 mg | Freq: Once | INTRAMUSCULAR | Status: DC

## 2012-08-05 MED ORDER — KETOROLAC TROMETHAMINE 30 MG/ML IJ SOLN: 30.0000 mg | Freq: Once | INTRAMUSCULAR | Status: DC

## 2012-08-05 MED ORDER — KETOROLAC TROMETHAMINE 30 MG/ML IJ SOLN
30.0000 mg | Freq: Once | INTRAMUSCULAR | Status: AC
  Administered 2012-08-05: 30 mg via INTRAMUSCULAR

## 2012-08-05 MED ORDER — CARVEDILOL 12.5 MG PO TABS
12.5000 mg | ORAL_TABLET | Freq: Two times a day (BID) | ORAL | Status: DC
Start: 2012-08-05 — End: 2012-08-27

## 2012-08-05 NOTE — Assessment & Plan Note (Signed)
Unclear etiology- will give IM toradol and phenergan today.   Will attempt to improve BP control, and f/u in 2 weeks with PCP.

## 2012-08-05 NOTE — Assessment & Plan Note (Signed)
Mild elevated- could be contributing to headaches.  D/c amlodipine as it makes her cramp and increase Coreg.  F/U with PCP in 2 weeks.

## 2012-08-05 NOTE — Patient Instructions (Signed)
I am sorry you have such a bad headache.  Please go home and sleep and let us know if you are not feeling better when you wake up.   Your blood pressure is high, which may be contributing to your headache.  I am increasing your Carvedilol dose, also please continue your Losartan.   Please make an appointment to see Dr. Gwenlyn Saran in 2 weeks so she can make sure you are doing better.

## 2012-08-05 NOTE — Progress Notes (Signed)
  Subjective:    Patient ID: Kristen [name], female    DOB: 1960/11/11, 52 y.o.   MRN: [id number]  HPI:  Kristen [name] comes in with headaches that have been going on for two weeks.  She says they are on the left side of her head and neck.  She has had no fall or injury.  She has no vision changes, nausea, light or sound sensitivity.  She has tried ibuprofen a few times which helped a little.  She has also not been sleeping well.   HTN: taking coreg and losartan, but not taking amlodipine because it makes her legs cramp.  She says she has checked her blood pressure a few times and it has been running a little high.    Past Medical History  Diagnosis Date  . Hyperlipidemia   . Hypertension   . Back pain     Seen by Neurosurery  . Peripheral neuropathy   . Allergy   . Obesity   . Post-menopausal     History  Substance Use Topics  . Smoking status: Former Games developer  . Smokeless tobacco: Never Used  . Alcohol Use: No    No family history on file.  ROS Pertinent items in HPI    Objective:  Physical Exam:  BP 147/94  Pulse 85  Temp(Src) 98.4 F (36.9 C) (Oral)  Ht 5\' 3"  (1.6 m)  Wt 232 lb (105.235 kg)  BMI 41.11 kg/m2 General appearance: alert, cooperative and no distress Head: Normocephalic, without obvious abnormality, atraumatic Lungs: clear to auscultation bilaterally Heart: regular rate and rhythm, S1, S2 normal, no murmur, click, rub or gallop Pulses: 2+ and symmetric Neuro: CN II-XII in tact, normal sensation and reflexes.       Assessment & Plan:

## 2012-08-05 NOTE — Addendum Note (Signed)
Addended by: Jone Baseman D on: 08/05/2012 02:42 PM   Modules accepted: Orders

## 2012-08-27 ENCOUNTER — Ambulatory Visit (INDEPENDENT_AMBULATORY_CARE_PROVIDER_SITE_OTHER): Payer: Medicare Other | Admitting: Family Medicine

## 2012-08-27 ENCOUNTER — Encounter: Payer: Self-pay | Admitting: Family Medicine

## 2012-08-27 ENCOUNTER — Ambulatory Visit (HOSPITAL_COMMUNITY)
Admission: RE | Admit: 2012-08-27 | Discharge: 2012-08-27 | Disposition: A | Discharge status: Home / Self Care | Payer: Medicare HMO | Source: Ambulatory Visit | Attending: Family Medicine | Admitting: Family Medicine

## 2012-08-27 VITALS — BP 160/91 | HR 71 | Temp 98.5°F | Ht 63.0 in | Wt 229.0 lb

## 2012-08-27 DIAGNOSIS — Z1211 Encounter for screening for malignant neoplasm of colon: Secondary | ICD-10-CM

## 2012-08-27 DIAGNOSIS — R0789 Other chest pain: Secondary | ICD-10-CM

## 2012-08-27 DIAGNOSIS — I10 Essential (primary) hypertension: Secondary | ICD-10-CM

## 2012-08-27 DIAGNOSIS — R079 Chest pain, unspecified: Secondary | ICD-10-CM | POA: Insufficient documentation

## 2012-08-27 DIAGNOSIS — R51 Headache: Secondary | ICD-10-CM

## 2012-08-27 DIAGNOSIS — R9431 Abnormal electrocardiogram [ECG] [EKG]: Secondary | ICD-10-CM | POA: Insufficient documentation

## 2012-08-27 MED ORDER — FLUTICASONE PROPIONATE 50 MCG/ACT NA SUSP: 2.0000 | Freq: Every day | NASAL | Status: DC

## 2012-08-27 MED ORDER — LEVOCETIRIZINE DIHYDROCHLORIDE 5 MG PO TABS: 5.0000 mg | ORAL_TABLET | Freq: Every evening | ORAL | Status: DC

## 2012-08-27 MED ORDER — LOSARTAN POTASSIUM 50 MG PO TABS: 50.0000 mg | ORAL_TABLET | Freq: Every day | ORAL | Status: DC

## 2012-08-27 MED ORDER — CARVEDILOL 12.5 MG PO TABS: 12.5000 mg | ORAL_TABLET | Freq: Two times a day (BID) | ORAL | Status: DC

## 2012-08-27 MED ORDER — NORTRIPTYLINE HCL 25 MG PO CAPS: 25.0000 mg | ORAL_CAPSULE | Freq: Every day | ORAL | Status: DC

## 2012-08-27 NOTE — Patient Instructions (Addendum)
For the blood pressure, please take coreg 12.5mg  twice a day and losartan 50mg  once a day. Please follow up with me in 10 days to 2 weeks for follow up on labwork and blood pressure check.   For the headache, come back if it gets worst or you have weakness. Going to get your eyes checked is a good idea.   Similarly, for the chest discomfort, let me know if it gets worst in feeling or lasts longer than usual or if you get short of breath and it doesn't go away.  It will be good for you to get a mammogram done.

## 2012-08-27 NOTE — Progress Notes (Signed)
Patient ID: ANANIAH MACIOLEK    DOB: 21-Feb-1961, 52 y.o.   MRN: [id number] --- Subjective:  Nakisha is a 52 y.o.female who presents for follow up on headache.  She was seen 3 weeks ago for headache which was thought to be from elevated blood pressure.  She reports that headache is bitemporal, frontal and on top of head. 1 month in duration. "thumping" ache. Intermittent, lasts 2-3 hrs. Laying down makes it better. Standing is worst. No blurred vision. Pain after reading. No photophobia, no phonophobia. No weakness. Taking aleve 2 times per day, which has not helped. She has some nasal congestion for which she takes flonase but she ran out a couple of days ago.   - chest discomfort: nagging pain, lasts 10-49minutes, located on left side of chest. Not worst with exertion. Not associated with activity level. No shortness of breath. Occurs 2-3 times per week. Has been present for several years, but she feels it happens more often now.  Patient doesn't smoke. Her mother had an MI prior to her 34's (she was chronically ill from lupus otherwise).   - HTN: doesn't take BP at home. Has been taking carvedilol regularly. Has been taking amlodipine sporadically as it causes myalgias. She was on losartan 100 but she has not been taken it as she didn't know she should.  ROS: see HPI Past Medical History: reviewed and updated medications and allergies. Social History: Tobacco: none  Objective: Filed Vitals:   08/27/12 1123  BP: 160/91  Pulse: 71  Temp: 98.5 F (36.9 C)    Physical Examination:   General appearance - alert, well appearing, and in no distress Ears - bilateral TM's and external ear canals normal Nose - congested and erythematous nasal turbinates bilaterally Tenderness to palpation of frontal sinuses.  Mouth - mucous membranes moist, pharynx normal without lesions Chest - clear to auscultation, no wheezes, rales or rhonchi, symmetric air entry Heart - normal rate, regular rhythm,  normal S1, S2, no murmurs Chest- reproducible tenderness upon palpitation of left chest wall.  Extremities - no edema in lower extremities.  Neuro - CN2-12 grossly intact, 5/5 strength in upper and lower extr b/l EKG: unchanged from 09/13/11

## 2012-08-28 ENCOUNTER — Encounter: Payer: Self-pay | Admitting: Gastroenterology

## 2012-08-28 DIAGNOSIS — R51 Headache: Secondary | ICD-10-CM | POA: Insufficient documentation

## 2012-08-28 DIAGNOSIS — R0789 Other chest pain: Secondary | ICD-10-CM | POA: Insufficient documentation

## 2012-08-28 DIAGNOSIS — R519 Headache, unspecified: Secondary | ICD-10-CM | POA: Insufficient documentation

## 2012-08-28 NOTE — Assessment & Plan Note (Addendum)
Ongoing headache. Differential includes pain from elevated BP vs sinus congestion vs eye strain. No red flags on exam warranting imaging. Will treat BP and assess whether pain is better with improved BP control. If continues with BP and sinus congestion control, will consider  Reviewed red flags for patient to come sooner: worsening pain, weakness, numbness.Marland KitchenMarland Kitchen

## 2012-08-28 NOTE — Assessment & Plan Note (Addendum)
Likely musculoskeletal in nature given reproducibility of pain with palpation. EKG not showing any acute change or any arrhythmia. Screening mammogram to be done.  If persistent would consider cardiology consult for stress test since she does have risk factors: obesity, hypertension, hyperlipidemia

## 2012-08-28 NOTE — Assessment & Plan Note (Addendum)
BP not well controled with systolic in 160's. Will stop amlodipine since patient is not taking it anyway due to myalagias. Will continue coreg 12.5mg  and restart losartan which patient was not taking. Restart at 50mg  daily. Will obtain BMP in 2 weeks for K and Cr and BP check.

## 2012-09-11 ENCOUNTER — Ambulatory Visit: Payer: Medicare HMO | Admitting: Family Medicine

## 2012-09-21 ENCOUNTER — Encounter: Payer: Self-pay | Admitting: Family Medicine

## 2012-09-21 ENCOUNTER — Ambulatory Visit (INDEPENDENT_AMBULATORY_CARE_PROVIDER_SITE_OTHER): Payer: Medicare HMO | Admitting: Family Medicine

## 2012-09-21 VITALS — BP 160/100 | HR 70 | Ht 63.0 in | Wt 234.0 lb

## 2012-09-21 DIAGNOSIS — I10 Essential (primary) hypertension: Secondary | ICD-10-CM

## 2012-09-21 MED ORDER — LOSARTAN POTASSIUM 100 MG PO TABS: 100.0000 mg | ORAL_TABLET | Freq: Every day | ORAL | Status: DC

## 2012-09-21 NOTE — Progress Notes (Signed)
S: Pt comes in today for BP follow up.  HYPERTENSION BP: 160/100 Meds: coreg 12.5 BID, losartan 50 (restarted 08/27/12) Taking meds: Yes     # of doses missed/week: 0 Symptoms: Headache: No Dizziness: No Vision changes: No SOB:  No Chest pain: No LE swelling: No Tobacco use: No   ALLERGIES Is currently Xzyal and flonase, which doesn't seem to be helping.  +congestion, +rhinorrhea, +cough, +itchy eyes.      ROS: Per HPI  History  Smoking status  . Former Smoker  Smokeless tobacco  . Never Used    O:  Filed Vitals:   09/21/12 1501  BP: 160/100  Pulse: 70    Gen: NAD CV: RRR, no murmur Pulm: CTA bilat, no wheezes or crackles Ext: Warm, no edema   A/P: 52 y.o. female p/w HTN, allergic rhinitis -See problem list -f/u in 2-4 weeks for BP recheck

## 2012-09-21 NOTE — Patient Instructions (Signed)
It was nice to meet you today.  Your blood pressure is still a little high-- increase your losartan to 100mg  (2 tablets once per day)-- I will send in a new prescription, so you can go back to taking 1 per day when you pick it up.  For your allergies, try doing some saline rinses in your nose when you come in from outside.  Limit your outside time while the pollen is high.  Come back in 2-4 weeks to have your pressure rechecked.

## 2012-09-21 NOTE — Assessment & Plan Note (Signed)
BP still elevated, cont coreg 12.5 BID, increase losartan to 100 qd.  Check BMET. F/u 2-4 weeks.

## 2012-09-22 ENCOUNTER — Ambulatory Visit (AMBULATORY_SURGERY_CENTER): Payer: Medicare HMO

## 2012-09-22 VITALS — Ht 63.0 in | Wt 234.6 lb

## 2012-09-22 DIAGNOSIS — Z1211 Encounter for screening for malignant neoplasm of colon: Secondary | ICD-10-CM

## 2012-09-22 LAB — BASIC METABOLIC PANEL
BUN: 10 mg/dL (ref 6–23)
CO2: 30 mEq/L (ref 19–32)
Chloride: 104 mEq/L (ref 96–112)
Glucose, Bld: 114 mg/dL — ABNORMAL HIGH (ref 70–99)
Potassium: 4 mEq/L (ref 3.5–5.3)

## 2012-09-22 MED ORDER — MOVIPREP 100 G PO SOLR: ORAL | Status: DC

## 2012-09-23 ENCOUNTER — Encounter: Payer: Self-pay | Admitting: Gastroenterology

## 2012-10-05 ENCOUNTER — Encounter: Payer: Self-pay | Admitting: Gastroenterology

## 2012-10-05 ENCOUNTER — Ambulatory Visit (AMBULATORY_SURGERY_CENTER): Payer: Medicare HMO | Admitting: Gastroenterology

## 2012-10-05 VITALS — BP 142/99 | HR 57 | Temp 97.1°F | Resp 15 | Ht 63.0 in | Wt 234.0 lb

## 2012-10-05 DIAGNOSIS — D126 Benign neoplasm of colon, unspecified: Secondary | ICD-10-CM

## 2012-10-05 DIAGNOSIS — K5730 Diverticulosis of large intestine without perforation or abscess without bleeding: Secondary | ICD-10-CM

## 2012-10-05 DIAGNOSIS — Z1211 Encounter for screening for malignant neoplasm of colon: Secondary | ICD-10-CM

## 2012-10-05 MED ORDER — SODIUM CHLORIDE 0.9 % IV SOLN: 500.0000 mL | INTRAVENOUS | Status: DC

## 2012-10-05 NOTE — Progress Notes (Signed)
Called to room to assist during endoscopic procedure.  Patient ID and intended procedure confirmed with present staff. Received instructions for my participation in the procedure from the performing physician.  

## 2012-10-05 NOTE — Progress Notes (Signed)
Patients care partners called again to inform to return to facility. Patient ready for discharge, reviewed instruction and teaching  Information with patient who verbalized understanding. 1112 carepartners arrive, reviewed discharge instructions, patient dc at 1116.

## 2012-10-05 NOTE — Patient Instructions (Signed)
YOU HAD AN ENDOSCOPIC PROCEDURE TODAY AT THE [facility]: Refer to the procedure report that was given to you for any specific questions about what was found during the examination.  If the procedure report does not answer your questions, please call your gastroenterologist to clarify.  If you requested that your care partner not be given the details of your procedure findings, then the procedure report has been included in a sealed envelope for you to review at your convenience later.  YOU SHOULD EXPECT: Some feelings of bloating in the abdomen. Passage of more gas than usual.  Walking can help get rid of the air that was put into your GI tract during the procedure and reduce the bloating. If you had a lower endoscopy (such as a colonoscopy or flexible sigmoidoscopy) you may notice spotting of blood in your stool or on the toilet paper. If you underwent a bowel prep for your procedure, then you may not have a normal bowel movement for a few days.  DIET: Your first meal following the procedure should be a light meal and then it is ok to progress to your normal diet.  A half-sandwich or bowl of soup is an example of a good first meal.  Heavy or fried foods are harder to digest and may make you feel nauseous or bloated.  Likewise meals heavy in dairy and vegetables can cause extra gas to form and this can also increase the bloating.  Drink plenty of fluids but you should avoid alcoholic beverages for 24 hours.  ACTIVITY: Your care partner should take you home directly after the procedure.  You should plan to take it easy, moving slowly for the rest of the day.  You can resume normal activity the day after the procedure however you should NOT DRIVE or use heavy machinery for 24 hours (because of the sedation medicines used during the test).    SYMPTOMS TO REPORT IMMEDIATELY: A gastroenterologist can be reached at any hour.  During normal business hours, 8:30 AM to 5:00 PM Monday through Friday,  call [phone].  After hours and on weekends, please call the GI answering service at [phone] who will take a message and have the physician on call contact you.   Following lower endoscopy (colonoscopy or flexible sigmoidoscopy):  Excessive amounts of blood in the stool  Significant tenderness or worsening of abdominal pains  Swelling of the abdomen that is new, acute  Fever of 100F or higher    FOLLOW UP: If any biopsies were taken you will be contacted by phone or by letter within the next 1-3 weeks.  Call your gastroenterologist if you have not heard about the biopsies in 3 weeks.  Our staff will call the home number listed on your records the next business day following your procedure to check on you and address any questions or concerns that you may have at that time regarding the information given to you following your procedure. This is a courtesy call and so if there is no answer at the home number and we have not heard from you through the emergency physician on call, we will assume that you have returned to your regular daily activities without incident.  SIGNATURES/CONFIDENTIALITY: You and/or your care partner have signed paperwork which will be entered into your electronic medical record.  These signatures attest to the fact that that the information above on your After Visit Summary has been reviewed and is understood.  Full responsibility of the confidentiality   of this discharge information lies with you and/or your care-partner.     

## 2012-10-05 NOTE — Progress Notes (Signed)
Patient did not experience any of the following events: a burn prior to discharge; a fall within the facility; wrong site/side/patient/procedure/implant event; or a hospital transfer or hospital admission upon discharge from the facility. (G8907) Patient did not have preoperative order for IV antibiotic SSI prophylaxis. (G8918)  

## 2012-10-05 NOTE — Progress Notes (Signed)
NO EGG OR SOY ALLERGY. EWM 

## 2012-10-05 NOTE — Op Note (Signed)
[facility] [address] [place], [address]   COLONOSCOPY PROCEDURE REPORT  PATIENT: Kristen, [name].  MR#: [id number] BIRTHDATE: January 30, 1961 , 52  yrs. old GENDER: Female ENDOSCOPIST: Mardella Layman, MD, [facility] REFERRED BY: PROCEDURE DATE:  10/05/2012 PROCEDURE:   Colonoscopy with snare polypectomy ASA CLASS:   Class II INDICATIONS:Average risk patient for colon cancer. MEDICATIONS: propofol (Diprivan) 200mg  IV  DESCRIPTION OF PROCEDURE:   After the risks and benefits and of the procedure were explained, informed consent was obtained.  A digital rectal exam revealed no abnormalities of the rectum.    The LB [id number]  endoscope was introduced through the anus and advanced to the cecum, which was identified by both the appendix and ileocecal valve .  The quality of the prep was excellent, using MoviPrep .  The instrument was then slowly withdrawn as the colon was fully examined.     COLON FINDINGS: Mild diverticulosis was noted in the sigmoid colon. A polypoid shaped pedunculated polyp ranging between 5-15mm in size was found in the descending colon.  A polypectomy was performed using snare cautery.  The resection was complete and the polyp tissue was completely retrieved.   Three small smooth sessile polyps, ranging between 3-37mm in size, were found in the descending colon.  A polypectomy was performed with a cold snare.  The resection was complete and the polyp tissue was partially retrieved.     Retroflexed views revealed no abnormalities.     The scope was then withdrawn from the patient and the procedure completed.  COMPLICATIONS: There were no complications. ENDOSCOPIC IMPRESSION: 1.   Mild diverticulosis was noted in the sigmoid colon 2.   Pedunculated polyp ranging between 5-43mm in size was found in the descending colon; polypectomy was performed using snare cautery  3.   Three small sessile polyps, ranging between 3-19mm in size,  were found in the descending colon; polypectomy was performed with a cold snare  RECOMMENDATIONS: 1.  Repeat Colonoscopy in 3 years. 2.  High fiber diet 3.  Continue current medications   REPEAT EXAM:  NF:AOZH, Judeth Cornfield MD  _______________________________ eSigned:  Mardella Layman, MD, [facility] 10/05/2012 10:29 AM     PATIENT NAME:  Kristen [name]. MR#: [id number]

## 2012-10-05 NOTE — Progress Notes (Signed)
Called for care partner on arrival to recovery. Receptionist stating they have gone to smoke. No return, called carepartner's cell phone, the had left the property and will return. Dr. Jarold Motto in twice to talk with carepartner, not available. md speaking with patient who was alert and oriented.

## 2012-10-06 ENCOUNTER — Telehealth: Payer: Self-pay

## 2012-10-06 NOTE — Telephone Encounter (Signed)
Left message on answering machine. 

## 2012-10-09 ENCOUNTER — Encounter: Payer: Self-pay | Admitting: Gastroenterology

## 2012-10-13 ENCOUNTER — Encounter: Payer: Self-pay | Admitting: Family Medicine

## 2012-10-13 ENCOUNTER — Ambulatory Visit (INDEPENDENT_AMBULATORY_CARE_PROVIDER_SITE_OTHER): Payer: Medicare HMO | Admitting: Family Medicine

## 2012-10-13 VITALS — BP 142/85 | HR 75 | Ht 63.0 in | Wt 231.0 lb

## 2012-10-13 DIAGNOSIS — I10 Essential (primary) hypertension: Secondary | ICD-10-CM

## 2012-10-13 NOTE — Patient Instructions (Addendum)
It was good to see you again today.  I think it is ok to wait to increase any of your medicines for now.  Keep working on the weight loss-- it's great that you have lost 3lb in 3 weeks!!  Come back to see Dr. Gwenlyn Saran (or me) in about a month, and if your blood pressure is still high, then we will make a medicine change at that time.

## 2012-10-13 NOTE — Progress Notes (Signed)
S: Pt comes in today for follow up.  HYPERTENSION BP: 142/85 Meds: coreg 12.5 BID, losartan 100 qd (increased on 5/5) Taking meds: Yes     # of doses missed/week: 0 Symptoms: Headache: No Dizziness: No Vision changes: No SOB:  No Chest pain: No LE swelling: No Tobacco use: No    ROS: Per HPI  History  Smoking status  . Former Smoker  Smokeless tobacco  . Never Used    O:  Filed Vitals:   10/13/12 1514  BP: 142/85  Pulse: 75    Gen: NAD CV: RRR, no murmur Pulm: CTA bilat, no wheezes or crackles Ext: Warm, no edema   A/P: 52 y.o. female p/w HTN -See problem list -f/u in 1 month

## 2012-10-13 NOTE — Assessment & Plan Note (Signed)
Significantly improved today on coreg 12.5 BID + losartan 100 qd.  Still higher than goal, but pt would like to work on weight loss before making an additional med change-- has lost 3lb in 3 weeks, so encouraged pt to keep up this trend.  F/u 1 month for recheck.

## 2012-11-13 ENCOUNTER — Ambulatory Visit: Payer: Medicare HMO | Admitting: Family Medicine

## 2012-11-30 ENCOUNTER — Other Ambulatory Visit: Payer: Self-pay

## 2012-11-30 DIAGNOSIS — Z1231 Encounter for screening mammogram for malignant neoplasm of breast: Secondary | ICD-10-CM

## 2012-12-08 ENCOUNTER — Encounter: Payer: Commercial Managed Care - HMO | Admitting: Family Medicine

## 2012-12-08 ENCOUNTER — Ambulatory Visit (INDEPENDENT_AMBULATORY_CARE_PROVIDER_SITE_OTHER): Payer: Medicare HMO | Admitting: Family Medicine

## 2012-12-08 ENCOUNTER — Encounter: Payer: Self-pay | Admitting: Family Medicine

## 2012-12-14 ENCOUNTER — Ambulatory Visit: Payer: Medicare HMO | Admitting: Family Medicine

## 2012-12-21 ENCOUNTER — Encounter (HOSPITAL_COMMUNITY): Payer: Self-pay | Admitting: Emergency Medicine

## 2012-12-21 ENCOUNTER — Emergency Department (HOSPITAL_COMMUNITY)
Admission: EM | Admit: 2012-12-21 | Discharge: 2012-12-21 | Disposition: A | Discharge status: Home / Self Care | Payer: Medicare HMO | Attending: Emergency Medicine | Admitting: Emergency Medicine

## 2012-12-21 DIAGNOSIS — Z79899 Other long term (current) drug therapy: Secondary | ICD-10-CM | POA: Insufficient documentation

## 2012-12-21 DIAGNOSIS — R1013 Epigastric pain: Secondary | ICD-10-CM | POA: Insufficient documentation

## 2012-12-21 DIAGNOSIS — Z9851 Tubal ligation status: Secondary | ICD-10-CM | POA: Insufficient documentation

## 2012-12-21 DIAGNOSIS — E669 Obesity, unspecified: Secondary | ICD-10-CM | POA: Insufficient documentation

## 2012-12-21 DIAGNOSIS — G4730 Sleep apnea, unspecified: Secondary | ICD-10-CM | POA: Insufficient documentation

## 2012-12-21 DIAGNOSIS — Z87891 Personal history of nicotine dependence: Secondary | ICD-10-CM | POA: Insufficient documentation

## 2012-12-21 DIAGNOSIS — E785 Hyperlipidemia, unspecified: Secondary | ICD-10-CM | POA: Insufficient documentation

## 2012-12-21 DIAGNOSIS — Z780 Asymptomatic menopausal state: Secondary | ICD-10-CM | POA: Insufficient documentation

## 2012-12-21 DIAGNOSIS — K8020 Calculus of gallbladder without cholecystitis without obstruction: Secondary | ICD-10-CM | POA: Insufficient documentation

## 2012-12-21 DIAGNOSIS — R630 Anorexia: Secondary | ICD-10-CM | POA: Insufficient documentation

## 2012-12-21 DIAGNOSIS — IMO0002 Reserved for concepts with insufficient information to code with codable children: Secondary | ICD-10-CM | POA: Insufficient documentation

## 2012-12-21 DIAGNOSIS — I10 Essential (primary) hypertension: Secondary | ICD-10-CM | POA: Insufficient documentation

## 2012-12-21 DIAGNOSIS — K219 Gastro-esophageal reflux disease without esophagitis: Secondary | ICD-10-CM | POA: Insufficient documentation

## 2012-12-21 DIAGNOSIS — Z880 Allergy status to penicillin: Secondary | ICD-10-CM | POA: Insufficient documentation

## 2012-12-21 DIAGNOSIS — G609 Hereditary and idiopathic neuropathy, unspecified: Secondary | ICD-10-CM | POA: Insufficient documentation

## 2012-12-21 DIAGNOSIS — R110 Nausea: Secondary | ICD-10-CM | POA: Insufficient documentation

## 2012-12-21 LAB — COMPREHENSIVE METABOLIC PANEL
AST: 19 U/L (ref 0–37)
Albumin: 3.2 g/dL — ABNORMAL LOW (ref 3.5–5.2)
Alkaline Phosphatase: 104 U/L (ref 39–117)
BUN: 9 mg/dL (ref 6–23)
Chloride: 103 mEq/L (ref 96–112)
Creatinine, Ser: 0.86 mg/dL (ref 0.50–1.10)
Potassium: 4 mEq/L (ref 3.5–5.1)
Total Bilirubin: 0.3 mg/dL (ref 0.3–1.2)
Total Protein: 6.7 g/dL (ref 6.0–8.3)

## 2012-12-21 LAB — CBC WITH DIFFERENTIAL/PLATELET
Basophils Absolute: 0 10*3/uL (ref 0.0–0.1)
Basophils Relative: 1 % (ref 0–1)
Eosinophils Absolute: 0.2 10*3/uL (ref 0.0–0.7)
MCH: 26.7 pg (ref 26.0–34.0)
MCHC: 32.2 g/dL (ref 30.0–36.0)
Monocytes Relative: 8 % (ref 3–12)
Neutro Abs: 3.4 10*3/uL (ref 1.7–7.7)
Neutrophils Relative %: 48 % (ref 43–77)
Platelets: 252 10*3/uL (ref 150–400)
RDW: 15.7 % — ABNORMAL HIGH (ref 11.5–15.5)

## 2012-12-21 LAB — LIPASE, BLOOD: Lipase: 40 U/L (ref 11–59)

## 2012-12-21 LAB — TROPONIN I: Troponin I: <0.3 ng/mL (ref <0.30)

## 2012-12-21 MED ORDER — ONDANSETRON 4 MG PO TBDP: ORAL_TABLET | ORAL | Status: DC

## 2012-12-21 MED ORDER — FAMOTIDINE 20 MG PO TABS: 20.0000 mg | ORAL_TABLET | Freq: Two times a day (BID) | ORAL | Status: DC

## 2012-12-21 MED ORDER — GI COCKTAIL ~~LOC~~
30.0000 mL | Freq: Once | ORAL | Status: AC
  Administered 2012-12-21: 30 mL via ORAL
  Filled 2012-12-21: qty 30

## 2012-12-21 MED ORDER — MORPHINE SULFATE 4 MG/ML IJ SOLN
4.0000 mg | Freq: Once | INTRAMUSCULAR | Status: AC
  Administered 2012-12-21: 4 mg via INTRAVENOUS
  Filled 2012-12-21: qty 1

## 2012-12-21 NOTE — ED Notes (Signed)
Pt has had abd pain for the past 5 days now with some nausea, pain constant, eating does not effect. Does not have a cycle, denies any urinary sx.

## 2012-12-21 NOTE — Discharge Instructions (Signed)
If you were given medicines take as directed.  If you are on coumadin or contraceptives realize their levels and effectiveness is altered by many different medicines.  If you have any reaction (rash, tongues swelling, other) to the medicines stop taking and see a physician.   °Please follow up as directed and return to the ER or see a physician for new or worsening symptoms.  Thank you. ° ° °

## 2012-12-21 NOTE — ED Provider Notes (Signed)
CSN: [id number]     Arrival date & time 12/21/12  1154 History     First MD Initiated Contact with Patient 12/21/12 1209     Chief Complaint  Patient presents with  . Abdominal Pain  . Nausea   (Consider location/radiation/quality/duration/timing/severity/associated sxs/prior Treatment) HPI Comments: 52 yo female with obesity, lipids, sleep apnea, gerd presents with epig pain for 5 days, intermittent, lasts hours, non exertional, no specific worsening causes, ache/ burning pain, non radiating.  No cardiac hx.  No diaphoresis sxs.  No gb issues in the past.  Pt tolerating po.  One episode vomiting.  Patient is a 52 y.o. female presenting with abdominal pain. The history is provided by the patient.  Abdominal Pain This is a recurrent problem. Associated symptoms include abdominal pain. Pertinent negatives include no chest pain, no headaches and no shortness of breath.    Past Medical History  Diagnosis Date  . Hyperlipidemia   . Hypertension   . Back pain     Seen by Neurosurery  . Peripheral neuropathy   . Allergy   . Obesity   . Post-menopausal   . Leg pain     Bil   Past Surgical History  Procedure Laterality Date  . Back surgery      2009  . Tubal ligation     Family History  Problem Relation Age of Onset  . Lupus Mother   . Heart disease Mother    History  Substance Use Topics  . Smoking status: Former Games developer  . Smokeless tobacco: Never Used  . Alcohol Use: No   OB History   Grav Para Term Preterm Abortions TAB SAB Ect Mult Living                 Review of Systems  Constitutional: Positive for appetite change. Negative for fever and chills.  HENT: Negative for neck pain and neck stiffness.   Eyes: Negative for visual disturbance.  Respiratory: Negative for shortness of breath.   Cardiovascular: Negative for chest pain.  Gastrointestinal: Positive for abdominal pain. Negative for vomiting.  Genitourinary: Negative for dysuria and flank pain.   Musculoskeletal: Negative for back pain.  Skin: Negative for rash.  Neurological: Negative for light-headedness and headaches.    Allergies  Penicillins and Amoxicillin  Home Medications   Current Outpatient Rx  Name  Route  Sig  Dispense  Refill  . EXPIRED: DULoxetine (CYMBALTA) 30 MG capsule   Oral   Take 1 capsule (30 mg total) by mouth daily.   30 capsule   11   . fluticasone (FLONASE) 50 MCG/ACT nasal spray   Nasal   Place 2 sprays into the nose daily.   16 g   6   . gabapentin (NEURONTIN) 300 MG capsule   Oral   Take 2 capsules (600 mg total) by mouth 3 (three) times daily.   180 capsule   6   . ibuprofen (ADVIL,MOTRIN) 600 MG tablet   Oral   Take 1 tablet (600 mg total) by mouth every 6 (six) hours as needed.   90 tablet   0   . levocetirizine (XYZAL) 5 MG tablet   Oral   Take 1 tablet (5 mg total) by mouth every evening.   30 tablet   3   . losartan (COZAAR) 100 MG tablet   Oral   Take 100 mg by mouth 2 (two) times daily.         . nortriptyline (PAMELOR) 25 MG capsule  Oral   Take 1 capsule (25 mg total) by mouth at bedtime.   30 capsule   6   . omeprazole (PRILOSEC) 20 MG capsule   Oral   Take 1 capsule (20 mg total) by mouth daily.   30 capsule   3   . rosuvastatin (CRESTOR) 40 MG tablet   Oral   Take 1 tablet (40 mg total) by mouth daily.   30 tablet   3    BP 130/76  Pulse 72  Temp(Src) 97.8 F (36.6 C) (Oral)  Resp 18  SpO2 98% Physical Exam  Nursing note and vitals reviewed. Constitutional: She is oriented to person, place, and time. She appears well-developed and well-nourished.  HENT:  Head: Normocephalic and atraumatic.  Eyes: Conjunctivae are normal. Right eye exhibits no discharge. Left eye exhibits no discharge.  Neck: Normal range of motion. Neck supple. No tracheal deviation present.  Cardiovascular: Normal rate and regular rhythm.   No murmur heard. Pulmonary/Chest: Effort normal and breath sounds normal.   Abdominal: Soft. She exhibits no distension. There is tenderness (epig moderate). There is no guarding.  Musculoskeletal: She exhibits no edema and no tenderness.  Neurological: She is alert and oriented to person, place, and time.  Skin: Skin is warm. No rash noted.  Psychiatric: She has a normal mood and affect.    ED Course   Procedures (including critical care time)  EMERGENCY DEPARTMENT BILIARY ULTRASOUND INTERPRETATION "Study: Limited Abdominal Ultrasound of the gallbladder and common bile duct."  INDICATIONS: Abdominal pain and Nausea Indication: Multiple views of the gallbladder and common bile duct are obtained with a  Multi-frequency probe."  PERFORMED BY:  Myself  IMAGES ARCHIVED?: Yes  FINDINGS: Gallstones present, Gallbladder wall normal in thickness and Sonographic Murphy's sign absent  LIMITATIONS: Body Habitus  INTERPRETATION: Cholelithiasis    Labs Reviewed  CBC WITH DIFFERENTIAL - Abnormal; Notable for the following:    RDW 15.7 (*)    All other components within normal limits  COMPREHENSIVE METABOLIC PANEL - Abnormal; Notable for the following:    Glucose, Bld 117 (*)    Albumin 3.2 (*)    GFR calc non Af Amer 77 (*)    GFR calc Af Amer 89 (*)    All other components within normal limits  LIPASE, BLOOD  TROPONIN I  URINALYSIS, ROUTINE W REFLEX MICROSCOPIC   No results found. No diagnosis found.  MDM  Well appearing.  With risk factors and non specific epig pain screening cardiac eval. Likely cause gastritis/ gerd/ biliary. Bedside US showed gallstone otherwise unremarkable. Close fup with pcp, well appearing on recheck, no pain or fever. DC   Enid Skeens, MD 12/21/12 1504

## 2012-12-23 ENCOUNTER — Ambulatory Visit
Admission: RE | Admit: 2012-12-23 | Discharge: 2012-12-23 | Disposition: A | Discharge status: Home / Self Care | Payer: Medicare HMO | Source: Ambulatory Visit

## 2012-12-23 ENCOUNTER — Ambulatory Visit: Payer: Medicare HMO | Admitting: Family Medicine

## 2012-12-23 DIAGNOSIS — Z1231 Encounter for screening mammogram for malignant neoplasm of breast: Secondary | ICD-10-CM

## 2013-01-05 ENCOUNTER — Encounter (HOSPITAL_COMMUNITY): Payer: Self-pay | Admitting: Emergency Medicine

## 2013-01-05 ENCOUNTER — Emergency Department (HOSPITAL_COMMUNITY): Payer: Medicare HMO

## 2013-01-05 ENCOUNTER — Emergency Department (HOSPITAL_COMMUNITY)
Admission: EM | Admit: 2013-01-05 | Discharge: 2013-01-05 | Disposition: A | Discharge status: Home / Self Care | Payer: Medicare HMO | Attending: Emergency Medicine | Admitting: Emergency Medicine

## 2013-01-05 DIAGNOSIS — Z8669 Personal history of other diseases of the nervous system and sense organs: Secondary | ICD-10-CM | POA: Insufficient documentation

## 2013-01-05 DIAGNOSIS — Z8639 Personal history of other endocrine, nutritional and metabolic disease: Secondary | ICD-10-CM | POA: Insufficient documentation

## 2013-01-05 DIAGNOSIS — J3489 Other specified disorders of nose and nasal sinuses: Secondary | ICD-10-CM | POA: Insufficient documentation

## 2013-01-05 DIAGNOSIS — Z880 Allergy status to penicillin: Secondary | ICD-10-CM | POA: Insufficient documentation

## 2013-01-05 DIAGNOSIS — E669 Obesity, unspecified: Secondary | ICD-10-CM | POA: Insufficient documentation

## 2013-01-05 DIAGNOSIS — IMO0002 Reserved for concepts with insufficient information to code with codable children: Secondary | ICD-10-CM | POA: Insufficient documentation

## 2013-01-05 DIAGNOSIS — Z79899 Other long term (current) drug therapy: Secondary | ICD-10-CM | POA: Insufficient documentation

## 2013-01-05 DIAGNOSIS — Z862 Personal history of diseases of the blood and blood-forming organs and certain disorders involving the immune mechanism: Secondary | ICD-10-CM | POA: Insufficient documentation

## 2013-01-05 DIAGNOSIS — R197 Diarrhea, unspecified: Secondary | ICD-10-CM | POA: Insufficient documentation

## 2013-01-05 DIAGNOSIS — J40 Bronchitis, not specified as acute or chronic: Secondary | ICD-10-CM

## 2013-01-05 DIAGNOSIS — I10 Essential (primary) hypertension: Secondary | ICD-10-CM | POA: Insufficient documentation

## 2013-01-05 DIAGNOSIS — Z87891 Personal history of nicotine dependence: Secondary | ICD-10-CM | POA: Insufficient documentation

## 2013-01-05 DIAGNOSIS — J209 Acute bronchitis, unspecified: Secondary | ICD-10-CM | POA: Insufficient documentation

## 2013-01-05 LAB — BASIC METABOLIC PANEL
CO2: 31 mEq/L (ref 19–32)
Calcium: 9 mg/dL (ref 8.4–10.5)
Potassium: 3.2 mEq/L — ABNORMAL LOW (ref 3.5–5.1)
Sodium: 139 mEq/L (ref 135–145)

## 2013-01-05 LAB — CBC
MCH: 26.6 pg (ref 26.0–34.0)
Platelets: 271 10*3/uL (ref 150–400)
RBC: 5.3 MIL/uL — ABNORMAL HIGH (ref 3.87–5.11)
WBC: 8.2 10*3/uL (ref 4.0–10.5)

## 2013-01-05 LAB — TROPONIN I: Troponin I: <0.3 ng/mL (ref <0.30)

## 2013-01-05 MED ORDER — ALBUTEROL SULFATE HFA 108 (90 BASE) MCG/ACT IN AERS
2.0000 | INHALATION_SPRAY | RESPIRATORY_TRACT | Status: DC
  Administered 2013-01-05: 2 via RESPIRATORY_TRACT
  Filled 2013-01-05: qty 6.7

## 2013-01-05 MED ORDER — IPRATROPIUM BROMIDE 0.02 % IN SOLN
0.5000 mg | Freq: Once | RESPIRATORY_TRACT | Status: AC
  Administered 2013-01-05: 0.5 mg via RESPIRATORY_TRACT
  Filled 2013-01-05: qty 2.5

## 2013-01-05 MED ORDER — ALBUTEROL SULFATE (5 MG/ML) 0.5% IN NEBU
5.0000 mg | INHALATION_SOLUTION | Freq: Once | RESPIRATORY_TRACT | Status: AC
  Administered 2013-01-05: 5 mg via RESPIRATORY_TRACT
  Filled 2013-01-05: qty 1

## 2013-01-05 NOTE — ED Notes (Signed)
MD at bedside.  EDP Campos in to see pt

## 2013-01-05 NOTE — ED Provider Notes (Signed)
CSN: [id number]     Arrival date & time 01/05/13  1016 History     First MD Initiated Contact with Patient 01/05/13 1107     Chief Complaint  Patient presents with  . Shortness of Breath    HPI Patient reports congestion some shortness of breath over the past 24 hours.  She's had chills without fever.  She denies abdominal pain nausea vomiting.  She had several episodes of loose stools.  She reports a history of high blood pressure high cholesterol.  She denies a history of COPD or emphysema.  She does not smoke cigarettes.  Her symptoms are mild to moderate in severity.  No orthopnea.  No lower extremity swelling.  No recent long travel or surgery.  No other complaints.   Past Medical History  Diagnosis Date  . Hyperlipidemia   . Hypertension   . Back pain     Seen by Neurosurery  . Peripheral neuropathy   . Allergy   . Obesity   . Post-menopausal   . Leg pain     Bil   Past Surgical History  Procedure Laterality Date  . Back surgery      2009  . Tubal ligation     Family History  Problem Relation Age of Onset  . Lupus Mother   . Heart disease Mother    History  Substance Use Topics  . Smoking status: Former Games developer  . Smokeless tobacco: Never Used  . Alcohol Use: No   OB History   Grav Para Term Preterm Abortions TAB SAB Ect Mult Living                 Review of Systems  All other systems reviewed and are negative.    Allergies  Penicillins and Amoxicillin  Home Medications   Current Outpatient Rx  Name  Route  Sig  Dispense  Refill  . carvedilol (COREG) 12.5 MG tablet   Oral   Take 12.5 mg by mouth 2 (two) times daily with a meal.         . DULoxetine (CYMBALTA) 30 MG capsule   Oral   Take 30 mg by mouth daily.         . famotidine (PEPCID) 20 MG tablet   Oral   Take 1 tablet (20 mg total) by mouth 2 (two) times daily.   30 tablet   0   . fluticasone (FLONASE) 50 MCG/ACT nasal spray   Nasal   Place 2 sprays into the nose daily.          Marland Kitchen gabapentin (NEURONTIN) 300 MG capsule   Oral   Take 300 mg by mouth 3 (three) times daily.         Marland Kitchen ibuprofen (ADVIL,MOTRIN) 600 MG tablet   Oral   Take 600 mg by mouth every 6 (six) hours as needed for pain.         Marland Kitchen losartan (COZAAR) 100 MG tablet   Oral   Take 100 mg by mouth daily.         . ondansetron (ZOFRAN ODT) 4 MG disintegrating tablet      4mg  ODT q4 hours prn nausea/vomit   4 tablet   0    BP 157/96  Pulse 99  Temp(Src) 98.9 F (37.2 C) (Oral)  Resp 18  SpO2 95% Physical Exam  Nursing note and vitals reviewed. Constitutional: She is oriented to person, place, and time. She appears well-developed and well-nourished. No distress.  HENT:  Head: Normocephalic and atraumatic.  Eyes: EOM are normal.  Neck: Normal range of motion.  Cardiovascular: Normal rate, regular rhythm and normal heart sounds.   Pulmonary/Chest: Effort normal. No respiratory distress. She has wheezes. She has no rales.  Abdominal: Soft. She exhibits no distension. There is no tenderness.  Musculoskeletal: Normal range of motion. She exhibits no edema and no tenderness.  Neurological: She is alert and oriented to person, place, and time.  Skin: Skin is warm and dry.  Psychiatric: She has a normal mood and affect. Judgment normal.    ED Course   Procedures (including critical care time)   Date: 01/05/2013  Rate: 96  Rhythm: normal sinus rhythm  QRS Axis: normal  Intervals: normal  ST/T Wave abnormalities: normal  Conduction Disutrbances: none  Narrative Interpretation:   Old EKG Reviewed: No significant changes noted     Labs Reviewed  CBC - Abnormal; Notable for the following:    RBC 5.30 (*)    All other components within normal limits  BASIC METABOLIC PANEL - Abnormal; Notable for the following:    Potassium 3.2 (*)    GFR calc non Af Amer 72 (*)    GFR calc Af Amer 83 (*)    All other components within normal limits  TROPONIN I   Dg Chest 2  View  01/05/2013   *RADIOLOGY REPORT*  Clinical Data: Shortness of breath  CHEST - 2 VIEW  Comparison: January 13, 2012.  Findings: Lungs clear.  Heart size and pulmonary vascularity are normal.  No adenopathy.  There is degenerative change in the thoracic spine.  IMPRESSION: No edema or consolidation.   Original Report Authenticated By: Bretta Bang, M.D.   I personally reviewed the imaging tests through PACS system I reviewed available ER/hospitalization records through the EMR   1. Bronchitis     MDM  Patient feels much better after breathing treatment.  Likely bronchitis.  Room air O2 sats are 95% after breathing treatment  Lyanne Co, MD 01/05/13 1236

## 2013-01-05 NOTE — ED Notes (Signed)
albuteral HHN with spacer given at diacharge

## 2013-01-05 NOTE — ED Notes (Signed)
Pt c/o SOB since yesterday.  C/o cough and diarrhea.  Pt 88% on RA.

## 2013-01-07 ENCOUNTER — Encounter: Payer: Self-pay | Admitting: Family Medicine

## 2013-01-07 ENCOUNTER — Ambulatory Visit (INDEPENDENT_AMBULATORY_CARE_PROVIDER_SITE_OTHER): Payer: Medicare HMO | Admitting: Family Medicine

## 2013-01-07 VITALS — BP 141/107 | HR 104 | Temp 98.2°F | Ht 63.0 in | Wt 218.0 lb

## 2013-01-07 DIAGNOSIS — E876 Hypokalemia: Secondary | ICD-10-CM

## 2013-01-07 DIAGNOSIS — R7303 Prediabetes: Secondary | ICD-10-CM

## 2013-01-07 DIAGNOSIS — N644 Mastodynia: Secondary | ICD-10-CM

## 2013-01-07 DIAGNOSIS — R7309 Other abnormal glucose: Secondary | ICD-10-CM

## 2013-01-07 DIAGNOSIS — J209 Acute bronchitis, unspecified: Secondary | ICD-10-CM

## 2013-01-07 LAB — BASIC METABOLIC PANEL
Calcium: 9.3 mg/dL (ref 8.4–10.5)
Chloride: 102 mEq/L (ref 96–112)
Creat: 0.98 mg/dL (ref 0.50–1.10)

## 2013-01-07 LAB — POCT GLYCOSYLATED HEMOGLOBIN (HGB A1C): Hemoglobin A1C: 5.9

## 2013-01-07 MED ORDER — AZITHROMYCIN 250 MG PO TABS: ORAL_TABLET | ORAL | Status: DC

## 2013-01-07 MED ORDER — FLUTICASONE PROPIONATE 50 MCG/ACT NA SUSP: 2.0000 | Freq: Every day | NASAL | Status: DC

## 2013-01-07 MED ORDER — CETIRIZINE HCL 10 MG PO TABS: 10.0000 mg | ORAL_TABLET | Freq: Every day | ORAL | Status: DC

## 2013-01-07 NOTE — Progress Notes (Signed)
Patient ID: [name]    DOB: 1960/09/05, 52 y.o.   MRN: [id number] --- Subjective:  Kristen [name] is a 52 y.o.female with h/o HTN, prediabetes, obesity who presents with cough and dyspnea. She also reports breast soreness.  - shortness of breath: started 2 weeks ago, dyspnea on exertion with a few feet. Associated with cough, productive of clear phlegm. Cough is worst at night. Has rhinorrhea and nasal congestion with postnasal drip. She was seen in the ED 2 days ago where CXR did not show any acute process. She was given albuterol inhaler. She reports that the inhaler doesn't help with the cough or shortness of breath. She has been taking it 3 times a day. No fevers or chills. Symptoms have remained the same as when it started.   - breast pain: has been ongoing for months to years but getting worst. Occurs twice a month. Present in both breasts, feeling of pressure and ache, better with lifting the breast up. Takes ibuprofen which helps mildly. Had a mammogram in July 2014 which was normal.   ROS: see HPI Past Medical History: reviewed and updated medications and allergies. Social History: Tobacco: none. Smoked when she was young, less than 5 pack year history.   Objective: Filed Vitals:   01/07/13 1125  BP: 141/107  Pulse: 104  Temp: 98.2 F (36.8 C)    Physical Examination:   General appearance - alert, well appearing, and in no distress Ears - bilateral TM's and external ear canals normal Nose - erythematous and congested, no drainage Mouth - mucous membranes moist, pharynx normal without lesions but cobblestone pattern present  Neck - supple, no significant adenopathy Chest - expiratory rhonchi throughout lung fields, normal work of breathing Heart - normal rate, regular rhythm, normal S1, S2, no murmurs Abdomen - soft, nontender, nondistended Extremities - no pedal edema Breast - no tenderness on exam, no masses or skin changes noted.

## 2013-01-07 NOTE — Assessment & Plan Note (Signed)
Large breasts may be causing some chest discomfort. Recommended braw fitting for adequate support and ibuprofen as needed.

## 2013-01-07 NOTE — Patient Instructions (Addendum)

## 2013-01-07 NOTE — Assessment & Plan Note (Addendum)
Cough and dyspnea with coarse breath sounds on exam. Symptoms ongoing for 2 weeks without improvement. Will treat with azithromycin. Will also treat upper airway congestion with nasal steroid and zyrtec.  Instructed patient to return if not better in 1 week. At which time, I will repeat CXR to rule out any focal pneumonia and schedule her for pulmonary function tests. She has a remote history of mild asthma as a child and would benefit from quantitative measure of her lung function.

## 2013-01-11 ENCOUNTER — Encounter: Payer: Self-pay | Admitting: Family Medicine

## 2013-01-22 ENCOUNTER — Telehealth: Payer: Self-pay | Admitting: Family Medicine

## 2013-01-22 NOTE — Telephone Encounter (Signed)
LMOM advising pt to sched OV

## 2013-01-22 NOTE — Telephone Encounter (Signed)
Pt is calling for  Refill on her pain medication vicodin.  She said the refill ran out two or three months ago and would like some more so she can pick it up. JW

## 2013-01-26 ENCOUNTER — Telehealth: Payer: Self-pay | Admitting: Family Medicine

## 2013-01-26 NOTE — Telephone Encounter (Signed)
Received fax refill request for vicodin 5/500 q8/prn. I have not refilled this medicine in a few months and faxed the form back stating that patient needs an appointment before I can refill medicine.   Marena Chancy, PGY-3 Family Medicine Resident

## 2013-01-29 NOTE — Progress Notes (Signed)
Enterred in error.

## 2013-02-01 ENCOUNTER — Encounter: Payer: Self-pay | Admitting: Family Medicine

## 2013-02-01 ENCOUNTER — Ambulatory Visit (INDEPENDENT_AMBULATORY_CARE_PROVIDER_SITE_OTHER): Payer: Medicare HMO | Admitting: Family Medicine

## 2013-02-01 VITALS — BP 133/84 | HR 100 | Ht 63.0 in | Wt 223.7 lb

## 2013-02-01 DIAGNOSIS — M549 Dorsalgia, unspecified: Secondary | ICD-10-CM

## 2013-02-01 DIAGNOSIS — IMO0002 Reserved for concepts with insufficient information to code with codable children: Secondary | ICD-10-CM

## 2013-02-01 DIAGNOSIS — J209 Acute bronchitis, unspecified: Secondary | ICD-10-CM

## 2013-02-01 MED ORDER — HYDROCODONE-ACETAMINOPHEN 5-325 MG PO TABS: 1.0000 | ORAL_TABLET | Freq: Two times a day (BID) | ORAL | Status: DC | PRN

## 2013-02-01 MED ORDER — KETOROLAC TROMETHAMINE 30 MG/ML IJ SOLN
30.0000 mg | Freq: Once | INTRAMUSCULAR | Status: AC
  Administered 2013-02-01: 30 mg via INTRAMUSCULAR

## 2013-02-01 MED ORDER — CYCLOBENZAPRINE HCL 10 MG PO TABS: 10.0000 mg | ORAL_TABLET | Freq: Three times a day (TID) | ORAL | Status: DC | PRN

## 2013-02-01 NOTE — Patient Instructions (Addendum)
For the back pain, return to medical care if you have worsening pain, worsening weakness or urine or bowel movement incontinence.   Follow up with me in 1 month.

## 2013-02-02 NOTE — Assessment & Plan Note (Signed)
Symptoms have resolved.

## 2013-02-02 NOTE — Assessment & Plan Note (Addendum)
Acute flare up of pain. Had not been needing vicodin on a daily basis.  Will refill vicodin in more acute setting. Patient filled out pain contract again since I had not prescribed vicodin in the past for her.  Gabapentin did not work in the past for her. Upon review of the notes, cymbalta may have worked but she cannot clearly remember this.  - Rx for flexeril as well - follow up in 1 month. Reassess. Hopefully acute flare will have resolved, allowing patient to resume water aerobics. At that point, may try tramadol.  - toradol IM given in office. Renal function with Cr: 0.98.

## 2013-02-02 NOTE — Progress Notes (Signed)
Patient ID: DAGNY FIORENTINO    DOB: 02/08/1961, 52 y.o.   MRN: [id number] --- Subjective:  Tanecia is a 52 y.o.female with h/o HTN, lumbar back pain with radiculopathy, pre-diabetes who presents with acute low back pain.  - low back pain: stabbing pain, starts in the left leg and goes up to the lower back. In addition to this, she has a nagging pain in her lower back that is intermittent. Rates pain as 8.5/10. She has numbness in the calf area. Pain has been ongoing for 2 weeks. No new weakness compared to prior. She walks with a cane.  She had back surgery in 2009 and has occasional flare ups of pain. She had not had one in a few months until more recently. She denies any change in activity or any trauma prior to this.  She used to do water aerobics which she has not been able to do since the pain started back again.  She states that she tried gabapentin in the past but it seemed to make pain worst. Flexeril has helped in the past as well as vicodin which she was prescribed under a pain contract.  For current pain episode, she has been using aleve twice a day which has not helped.  ROS: see HPI Past Medical History: reviewed and updated medications and allergies. Social History: Tobacco: former smoker  Objective: Filed Vitals:   02/01/13 0852  BP: 133/84  Pulse: 100    Physical Examination:   General appearance - alert, well appearing, and in no distress Chest - clear to auscultation, no wheezes, no rhonchi compared to prior exam Heart - mildly tachycardic, regular rhythm, normal S1, S2, no murmurs MSK - tenderness to palpation along lumbar spine and paraspinal muscles bilaterally, negative straight leg, 4/5 strength in left knee extension compared to 5/5 on right, 4+/5 knee flexion bilaterally, decreased plantarflexion on right compared to left.

## 2013-03-05 ENCOUNTER — Ambulatory Visit: Payer: Medicare HMO | Admitting: Family Medicine

## 2013-03-12 ENCOUNTER — Emergency Department (HOSPITAL_COMMUNITY)
Admission: EM | Admit: 2013-03-12 | Discharge: 2013-03-12 | Disposition: A | Discharge status: Home / Self Care | Payer: Medicare HMO | Attending: Emergency Medicine | Admitting: Emergency Medicine

## 2013-03-12 ENCOUNTER — Encounter (HOSPITAL_COMMUNITY): Payer: Self-pay | Admitting: Emergency Medicine

## 2013-03-12 DIAGNOSIS — Z3202 Encounter for pregnancy test, result negative: Secondary | ICD-10-CM | POA: Insufficient documentation

## 2013-03-12 DIAGNOSIS — A599 Trichomoniasis, unspecified: Secondary | ICD-10-CM | POA: Insufficient documentation

## 2013-03-12 DIAGNOSIS — R1012 Left upper quadrant pain: Secondary | ICD-10-CM | POA: Insufficient documentation

## 2013-03-12 DIAGNOSIS — Z880 Allergy status to penicillin: Secondary | ICD-10-CM | POA: Insufficient documentation

## 2013-03-12 DIAGNOSIS — IMO0002 Reserved for concepts with insufficient information to code with codable children: Secondary | ICD-10-CM | POA: Insufficient documentation

## 2013-03-12 DIAGNOSIS — R197 Diarrhea, unspecified: Secondary | ICD-10-CM | POA: Insufficient documentation

## 2013-03-12 DIAGNOSIS — Z8669 Personal history of other diseases of the nervous system and sense organs: Secondary | ICD-10-CM | POA: Insufficient documentation

## 2013-03-12 DIAGNOSIS — R1013 Epigastric pain: Secondary | ICD-10-CM | POA: Insufficient documentation

## 2013-03-12 DIAGNOSIS — Z87891 Personal history of nicotine dependence: Secondary | ICD-10-CM | POA: Insufficient documentation

## 2013-03-12 DIAGNOSIS — E669 Obesity, unspecified: Secondary | ICD-10-CM | POA: Insufficient documentation

## 2013-03-12 DIAGNOSIS — Z8742 Personal history of other diseases of the female genital tract: Secondary | ICD-10-CM | POA: Insufficient documentation

## 2013-03-12 DIAGNOSIS — G8929 Other chronic pain: Secondary | ICD-10-CM | POA: Insufficient documentation

## 2013-03-12 DIAGNOSIS — I10 Essential (primary) hypertension: Secondary | ICD-10-CM | POA: Insufficient documentation

## 2013-03-12 DIAGNOSIS — Z79899 Other long term (current) drug therapy: Secondary | ICD-10-CM | POA: Insufficient documentation

## 2013-03-12 LAB — URINALYSIS, ROUTINE W REFLEX MICROSCOPIC
Bilirubin Urine: NEGATIVE
Ketones, ur: NEGATIVE mg/dL
Nitrite: NEGATIVE
Protein, ur: NEGATIVE mg/dL
Specific Gravity, Urine: 1.019 (ref 1.005–1.030)
Urobilinogen, UA: 0.2 mg/dL (ref 0.0–1.0)
pH: 8 (ref 5.0–8.0)

## 2013-03-12 LAB — POCT I-STAT, CHEM 8
Calcium, Ion: 1.22 mmol/L (ref 1.12–1.23)
Chloride: 102 mEq/L (ref 96–112)
Creatinine, Ser: 1.1 mg/dL (ref 0.50–1.10)
Glucose, Bld: 94 mg/dL (ref 70–99)
HCT: 50 % — ABNORMAL HIGH (ref 36.0–46.0)
Hemoglobin: 17 g/dL — ABNORMAL HIGH (ref 12.0–15.0)
Potassium: 4.1 mEq/L (ref 3.5–5.1)
TCO2: 29 mmol/L (ref 0–100)

## 2013-03-12 LAB — URINE MICROSCOPIC-ADD ON

## 2013-03-12 LAB — CBC WITH DIFFERENTIAL/PLATELET
Hemoglobin: 14.2 g/dL (ref 12.0–15.0)
Lymphs Abs: 3 10*3/uL (ref 0.7–4.0)
Monocytes Relative: 9 % (ref 3–12)
Neutro Abs: 3.2 10*3/uL (ref 1.7–7.7)
Neutrophils Relative %: 45 % (ref 43–77)
RBC: 5.25 MIL/uL — ABNORMAL HIGH (ref 3.87–5.11)

## 2013-03-12 LAB — HEPATIC FUNCTION PANEL
ALT: 12 U/L (ref 0–35)
AST: 17 U/L (ref 0–37)
Albumin: 3.4 g/dL — ABNORMAL LOW (ref 3.5–5.2)
Total Protein: 7.2 g/dL (ref 6.0–8.3)

## 2013-03-12 LAB — TROPONIN I: Troponin I: <0.3 ng/mL (ref <0.30)

## 2013-03-12 LAB — PREGNANCY, URINE: Preg Test, Ur: NEGATIVE

## 2013-03-12 MED ORDER — MORPHINE SULFATE 4 MG/ML IJ SOLN
4.0000 mg | Freq: Once | INTRAMUSCULAR | Status: AC
  Administered 2013-03-12: 4 mg via INTRAVENOUS
  Filled 2013-03-12: qty 1

## 2013-03-12 MED ORDER — METRONIDAZOLE 500 MG PO TABS
2000.0000 mg | ORAL_TABLET | Freq: Once | ORAL | Status: AC
  Administered 2013-03-12: 2000 mg via ORAL
  Filled 2013-03-12: qty 4

## 2013-03-12 MED ORDER — GI COCKTAIL ~~LOC~~
30.0000 mL | Freq: Once | ORAL | Status: AC
  Administered 2013-03-12: 30 mL via ORAL
  Filled 2013-03-12: qty 30

## 2013-03-12 MED ORDER — PANTOPRAZOLE SODIUM 20 MG PO TBEC: 20.0000 mg | DELAYED_RELEASE_TABLET | Freq: Every day | ORAL | Status: DC

## 2013-03-12 MED ORDER — SODIUM CHLORIDE 0.9 % IV BOLUS (SEPSIS)
1000.0000 mL | Freq: Once | INTRAVENOUS | Status: AC
Start: 2013-03-12 — End: 2013-03-12
  Administered 2013-03-12: 1000 mL via INTRAVENOUS

## 2013-03-12 NOTE — ED Provider Notes (Signed)
CSN: [id number]     Arrival date & time 03/12/13  1212 History   First MD Initiated Contact with Patient 03/12/13 1223     No chief complaint on file.  (Consider location/radiation/quality/duration/timing/severity/associated sxs/prior Treatment) HPI  52 year old female with history of hyperlipidemia, hypertension, chronic back pain presents complaining of abdominal pain.  Patient reports she has been having epigastric abdominal pain that radiates towards her left upper quadrant and to her back intermittently for the past 2 weeks. Pain is described as a stabbing sensation usually lasting for about 5-10 minutes and resolved on its own. Pain is currently 8/10. Nothing seems to aggravate or alleviate her symptoms. She has occasional nausea and did vomit once within the past week. She also having some mild loose stools. She denies having any fever, chills, headache, lightheadedness, dizziness, chest pain, shortness of breath, productive cough, hemoptysis, hematemesis, dysuria, hematuria, hematochezia, or melena. No complaints of numbness or weakness. She does have history of low back pain which she takes ibuprofen on a regular basis. She denies any history of alcohol abuse a history of diabetes. No recent medication changes. No history of AAA.  No history of cardiac disease except for hypertension in which she takes carvedilol and losartan. Patient does not think her pain is related to heartburn.  Pt had colonoscopy several months ago, with finding of benign polyps.    Past Medical History  Diagnosis Date  . Hyperlipidemia   . Hypertension   . Back pain     Seen by Neurosurery  . Peripheral neuropathy   . Allergy   . Obesity   . Post-menopausal   . Leg pain     Bil   Past Surgical History  Procedure Laterality Date  . Back surgery      2009  . Tubal ligation     Family History  Problem Relation Age of Onset  . Lupus Mother   . Heart disease Mother    History  Substance Use Topics  .  Smoking status: Former Games developer  . Smokeless tobacco: Never Used  . Alcohol Use: No   OB History   Grav Para Term Preterm Abortions TAB SAB Ect Mult Living                 Review of Systems  All other systems reviewed and are negative.    Allergies  Penicillins and Amoxicillin  Home Medications   Current Outpatient Rx  Name  Route  Sig  Dispense  Refill  . azithromycin (ZITHROMAX) 250 MG tablet      Take 2 pills the first day, then take 1 tablet for 4 days.   6 tablet   0   . carvedilol (COREG) 12.5 MG tablet   Oral   Take 12.5 mg by mouth 2 (two) times daily with a meal.         . cetirizine (ZYRTEC) 10 MG tablet   Oral   Take 1 tablet (10 mg total) by mouth daily.   30 tablet   11   . cyclobenzaprine (FLEXERIL) 10 MG tablet   Oral   Take 1 tablet (10 mg total) by mouth 3 (three) times daily as needed for muscle spasms.   90 tablet   0   . famotidine (PEPCID) 20 MG tablet   Oral   Take 1 tablet (20 mg total) by mouth 2 (two) times daily.   30 tablet   0   . fluticasone (FLONASE) 50 MCG/ACT nasal spray   Nasal  Place 2 sprays into the nose daily.   16 g   6   . HYDROcodone-acetaminophen (NORCO/VICODIN) 5-325 MG per tablet   Oral   Take 1 tablet by mouth 2 (two) times daily as needed for pain.   60 tablet   0   . ibuprofen (ADVIL,MOTRIN) 600 MG tablet   Oral   Take 600 mg by mouth every 6 (six) hours as needed for pain.         Marland Kitchen losartan (COZAAR) 100 MG tablet   Oral   Take 100 mg by mouth daily.         . ondansetron (ZOFRAN ODT) 4 MG disintegrating tablet      4mg  ODT q4 hours prn nausea/vomit   4 tablet   0    BP 149/93  Pulse 66  Temp(Src) 98.3 F (36.8 C) (Oral)  Resp 20  SpO2 98% Physical Exam  Nursing note and vitals reviewed. Constitutional: She appears well-developed and well-nourished. No distress.  Awake, alert, nontoxic appearance Moderately obese.  HENT:  Head: Atraumatic.  Eyes: Conjunctivae are normal. Right  eye exhibits no discharge. Left eye exhibits no discharge.  Neck: Neck supple.  Cardiovascular: Normal rate and regular rhythm.   Pulmonary/Chest: Effort normal. No respiratory distress. She exhibits no tenderness.  Abdominal: Soft. Bowel sounds are normal. There is no hepatosplenomegaly. There is tenderness (mild epigastric tenderness without guarding or rebound tenderness. No CVA tenderness. No overlying skin changes.). There is no rigidity, no rebound, no guarding, no CVA tenderness, no tenderness at McBurney's point and negative Murphy's sign. No hernia.  Musculoskeletal: She exhibits no tenderness.  ROM appears intact, no obvious focal weakness  Neurological:  Mental status and motor strength appears intact  Skin: No rash noted.  Psychiatric: She has a normal mood and affect.    ED Course  Procedures (including critical care time)  12:54 PM Patient here with epigastric abdominal pain. Pain is not suggestive of cardiopulmonary disease.  Non surgical abdomen.  GI cocktail given.  Work up initiated.    2:22 PM Labs remarkable for a mildly elevated lipase of 101, and mildly elevated alk phos of 119.  Her pain is controlled. Doubt gallstone as a source.  No medication that may cause pancreatitis.  Care discussed with attending, who recommend outpt f/u for recheck of enzyme.  Will d/c with PPI and clear liquid diet.  Return precaution discussed.  No further advance imaging indicated at this time.    Labs Review Labs Reviewed  CBC WITH DIFFERENTIAL - Abnormal; Notable for the following:    RBC 5.25 (*)    All other components within normal limits  HEPATIC FUNCTION PANEL - Abnormal; Notable for the following:    Albumin 3.4 (*)    Alkaline Phosphatase 119 (*)    All other components within normal limits  LIPASE, BLOOD - Abnormal; Notable for the following:    Lipase 101 (*)    All other components within normal limits  URINALYSIS, ROUTINE W REFLEX MICROSCOPIC - Abnormal; Notable for  the following:    APPearance CLOUDY (*)    Leukocytes, UA SMALL (*)    All other components within normal limits  POCT I-STAT, CHEM 8 - Abnormal; Notable for the following:    Hemoglobin 17.0 (*)    HCT 50.0 (*)    All other components within normal limits  TROPONIN I  PREGNANCY, URINE  URINE MICROSCOPIC-ADD ON   Imaging Review No results found.  EKG Interpretation  Ventricular Rate:  61 PR Interval:  162 QRS Duration: 82 QT Interval:  426 QTC Calculation: 429 R Axis:   27 Text Interpretation:  Sinus rhythm Low voltage, precordial leads No ischemia             MDM   1. Abdominal pain, LUQ   2. Trichomonal infection    BP 175/109  Pulse 64  Temp(Src) 98.3 F (36.8 C) (Oral)  Resp 21  SpO2 99%  I have reviewed nursing notes and vital signs. I reviewed available ER/hospitalization records thought the EMR     Fayrene Helper, [place] 03/12/13 1527

## 2013-03-12 NOTE — ED Notes (Signed)
Per pt LUQ pain radiating to back on and off for 2 weeks-nausea

## 2013-03-19 NOTE — ED Provider Notes (Signed)
Medical screening examination/treatment/procedure(s) were performed by non-physician practitioner and as supervising physician I was immediately available for consultation/collaboration.  EKG Interpretation     Ventricular Rate:  61 PR Interval:  162 QRS Duration: 82 QT Interval:  426 QTC Calculation: 429 R Axis:   27 Text Interpretation:  Sinus rhythm Low voltage, precordial leads No ischemia               Roney Marion, MD 03/19/13 [phone]

## 2013-04-28 ENCOUNTER — Telehealth: Payer: Self-pay | Admitting: Family Medicine

## 2013-04-28 MED ORDER — ALBUTEROL SULFATE HFA 108 (90 BASE) MCG/ACT IN AERS: 1.0000 | INHALATION_SPRAY | Freq: Four times a day (QID) | RESPIRATORY_TRACT | Status: DC | PRN

## 2013-04-28 NOTE — Telephone Encounter (Signed)
Please call patient to clarify about albuterol refill request. I do not see albuterol listed on her med list. Is that what she needed refilled? Thank you!  Marena Chancy, PGY-3 Family Medicine Resident

## 2013-04-28 NOTE — Telephone Encounter (Signed)
Forward to PCP for refill.[name], [place]  

## 2013-04-28 NOTE — Telephone Encounter (Signed)
Sent Rx for albuterol inhaler  Kristen [name], PGY-3 Family Medicine Resident

## 2013-04-28 NOTE — Telephone Encounter (Signed)
Called pt and she reports, that she has received albuteral inhaler from [place] and it really helped her. She has also received albuteral nebulizer tx from [place].  I told the pt, that I will let Dr.Losq know. Also advised pt, to schedule OV, if she has SOB. Pt agreed. Kristen [name], Renato Battles

## 2013-04-28 NOTE — Telephone Encounter (Signed)
Refill for albuterol.

## 2013-06-22 ENCOUNTER — Ambulatory Visit (INDEPENDENT_AMBULATORY_CARE_PROVIDER_SITE_OTHER): Payer: Medicare HMO | Admitting: Family Medicine

## 2013-06-22 ENCOUNTER — Encounter: Payer: Self-pay | Admitting: Family Medicine

## 2013-06-22 VITALS — BP 146/94 | HR 79 | Temp 98.3°F | Ht 63.0 in | Wt 225.0 lb

## 2013-06-22 DIAGNOSIS — Z23 Encounter for immunization: Secondary | ICD-10-CM

## 2013-06-22 DIAGNOSIS — E785 Hyperlipidemia, unspecified: Secondary | ICD-10-CM

## 2013-06-22 DIAGNOSIS — I10 Essential (primary) hypertension: Secondary | ICD-10-CM

## 2013-06-22 DIAGNOSIS — G609 Hereditary and idiopathic neuropathy, unspecified: Secondary | ICD-10-CM

## 2013-06-22 DIAGNOSIS — R7309 Other abnormal glucose: Secondary | ICD-10-CM

## 2013-06-22 DIAGNOSIS — R51 Headache: Secondary | ICD-10-CM

## 2013-06-22 DIAGNOSIS — R7303 Prediabetes: Secondary | ICD-10-CM

## 2013-06-22 DIAGNOSIS — R519 Headache, unspecified: Secondary | ICD-10-CM

## 2013-06-22 LAB — POCT GLYCOSYLATED HEMOGLOBIN (HGB A1C): Hemoglobin A1C: 5

## 2013-06-22 MED ORDER — PREGABALIN 75 MG PO CAPS: 75.0000 mg | ORAL_CAPSULE | Freq: Two times a day (BID) | ORAL | Status: DC

## 2013-06-22 NOTE — Patient Instructions (Signed)
Follow up in 1 month with me. Get your cholesterol done a couple of days before.  Please return to the clinic for a lab appointment only. You can make the appointment by calling a couple of days prior to the day you would like to get your labs drawn.  Please come fasting for the labwork: no food or drink other than water after midnight.  Check your blood pressure 2-3 times a week and bring a log back.   Reduce your salt intake.   2 Gram Low Sodium Diet A 2 gram sodium diet restricts the amount of sodium in the diet to no more than 2 g or 2000 mg daily. Limiting the amount of sodium is often used to help lower blood pressure. It is important if you have heart, liver, or kidney problems. Many foods contain sodium for flavor and sometimes as a preservative. When the amount of sodium in a diet needs to be low, it is important to know what to look for when choosing foods and drinks. The following includes some information and guidelines to help make it easier for you to adapt to a low sodium diet. QUICK TIPS  Do not add salt to food.  Avoid convenience items and fast food.  Choose unsalted snack foods.  Buy lower sodium products, often labeled as "lower sodium" or "no salt added."  Check food labels to learn how much sodium is in 1 serving.  When eating at a restaurant, ask that your food be prepared with less salt or none, if possible. READING FOOD LABELS FOR SODIUM INFORMATION The nutrition facts label is a good place to find how much sodium is in foods. Look for products with no more than 500 to 600 mg of sodium per meal and no more than 150 mg per serving. Remember that 2 g = 2000 mg. The food label may also list foods as:  Sodium-free: Less than 5 mg in a serving.  Very low sodium: 35 mg or less in a serving.  Low-sodium: 140 mg or less in a serving.  Light in sodium: 50% less sodium in a serving. For example, if a food that usually has 300 mg of sodium is changed to become light in  sodium, it will have 150 mg of sodium.  Reduced sodium: 25% less sodium in a serving. For example, if a food that usually has 400 mg of sodium is changed to reduced sodium, it will have 300 mg of sodium. CHOOSING FOODS Grains  Avoid: Salted crackers and snack items. Some cereals, including instant hot cereals. Bread stuffing and biscuit mixes. Seasoned rice or pasta mixes.  Choose: Unsalted snack items. Low-sodium cereals, oats, puffed wheat and rice, shredded wheat. English muffins and bread. Pasta. Meats  Avoid: Salted, canned, smoked, spiced, pickled meats, including fish and poultry. Bacon, ham, sausage, cold cuts, hot dogs, anchovies.  Choose: Low-sodium canned tuna and salmon. Fresh or frozen meat, poultry, and fish. Dairy  Avoid: Processed cheese and spreads. Cottage cheese. Buttermilk and condensed milk. Regular cheese.  Choose: Milk. Low-sodium cottage cheese. Yogurt. Sour cream. Low-sodium cheese. Fruits and Vegetables  Avoid: Regular canned vegetables. Regular canned tomato sauce and paste. Frozen vegetables in sauces. Olives. Angie Fava. Relishes. Sauerkraut.  Choose: Low-sodium canned vegetables. Low-sodium tomato sauce and paste. Frozen or fresh vegetables. Fresh and frozen fruit. Condiments  Avoid: Canned and packaged gravies. Worcestershire sauce. Tartar sauce. Barbecue sauce. Soy sauce. Steak sauce. Ketchup. Onion, garlic, and table salt. Meat flavorings and tenderizers.  Choose: Fresh  and dried herbs and spices. Low-sodium varieties of mustard and ketchup. Lemon juice. Tabasco sauce. Horseradish. SAMPLE 2 GRAM SODIUM MEAL PLAN Breakfast / Sodium (mg)  1 cup low-fat milk / 510 mg  2 slices whole-wheat toast / 270 mg  1 tbs heart-healthy margarine / 153 mg  1 hard-boiled egg / 139 mg  1 small orange / 0 mg Lunch / Sodium (mg)  1 cup raw carrots / 76 mg   cup hummus / 298 mg  1 cup low-fat milk / 143 mg   cup red grapes / 2 mg  1 whole-wheat pita bread  / 356 mg Dinner / Sodium (mg)  1 cup whole-wheat pasta / 2 mg  1 cup low-sodium tomato sauce / 73 mg  3 oz lean ground beef / 57 mg  1 small side salad (1 cup raw spinach leaves,  cup cucumber,  cup yellow bell pepper) with 1 tsp olive oil and 1 tsp red wine vinegar / 25 mg Snack / Sodium (mg)  1 container low-fat vanilla yogurt / 107 mg  3 graham cracker squares / 127 mg Nutrient Analysis  Calories: 2033  Protein: 77 g  Carbohydrate: 282 g  Fat: 72 g  Sodium: 1971 mg Document Released: 05/06/2005 Document Revised: 07/29/2011 Document Reviewed: 08/07/2009 Exit[employer] Patient Information 2014 [employer].

## 2013-06-23 NOTE — Assessment & Plan Note (Signed)
Likely not from diabetes since A1C of 5 Good circulation. Could be from chronic back pain. No associated weakness or loss of function - trial of lyrica. Patient had been on gabapentin before but did not tolerate it as it made her pain worst.

## 2013-06-23 NOTE — Assessment & Plan Note (Signed)
Continues to have headaches. Unclear etiology at this time. Doesn't appear migrainous in description of symptoms.  She does report some decreased vision acuity and I will therefore refer to ophthalmology for visual acuity eval in case headaches are caused from straining.

## 2013-06-23 NOTE — Assessment & Plan Note (Signed)
On losartan 100 and coreg 12.5mg  bid. BP of 146/94 which is not quite at goal  - patient to monitor BP at home and bring log back  - if home BP's high, could consider adding hctz. Unclear as to why not on it. Will ask her at next visit.  - recommended that patient consider wearing a CPAP for OSA explaining that it could be playing a role in her elevated BP.

## 2013-06-23 NOTE — Assessment & Plan Note (Signed)
Lipid panel to be done a few days prior to her follow up.  Will likely start patient on statin.

## 2013-06-23 NOTE — Assessment & Plan Note (Signed)
Well controled at 5.0.  Repeat A1C in 6 months

## 2013-06-23 NOTE — Progress Notes (Signed)
Patient ID: Kristen [name]    DOB: [birth year], 53 y.o.   MRN: [id number] --- Subjective:  Kristen [name] is a 53 y.o.female who presents for follow up on HTN. She also reports left lower leg numbness and tingling.   # Hypertension: Medications: losartan 100mg  daily, coreg 12.5mg  bid Number of doses missed in 1 week: 0 BP at home: doesn't check Exercise routine: goes to gym 3 times a week Salt in diet: some Chest pain: none Lower extremity swelling: none Shortness of breath: none Headache: 3 times per week, frontal, stabbing, pinching, x3 months. advil helps.  Change in vision: blurred vision when looking afar   # Lower leg numbness and tingling:  3 months ago. Tingling in left foot, lasts 10-26minutes. Occurs 3 times a day,every day. No weakness. Feeling of leg being heavier than normal. No new back pain. No urine or bowel incontinence. Had tried gabapentin in the past but made her symptoms worst.   ROS: see HPI Past Medical History: reviewed and updated medications and allergies. Social History: Tobacco: none currently  Objective: Filed Vitals:   06/22/13 0944  BP: 146/94  Pulse: 79  Temp:     Physical Examination:   General appearance - alert, well appearing, and in no distress Chest - clear to auscultation, no wheezes, rales or rhonchi, symmetric air entry Heart - normal rate, regular rhythm, normal S1, S2, no murmurs Extremities - +2 dorsalis pedis pulse bilaterally, normal sensation to light touch, decreased microfilament testing in left great toe.  Neuro - CN2-12 grossly intact   Last BMP and lipid panel:  Lipid Panel     Component Value Date/Time   CHOL 292* 09/25/2010 1114   TRIG 115 09/25/2010 1114   HDL 38* 09/25/2010 1114   CHOLHDL 7.7 09/25/2010 1114   VLDL 23 09/25/2010 1114   LDLCALC 231* 09/25/2010 1114    CMP     Component Value Date/Time   NA 142 03/12/2013 1310   K 4.1 03/12/2013 1310   CL 102 03/12/2013 1310   CO2 28 01/07/2013 1214   GLUCOSE 94  03/12/2013 1310   BUN 6 03/12/2013 1310   CREATININE 1.10 03/12/2013 1310   CREATININE 0.98 01/07/2013 1214   CALCIUM 9.3 01/07/2013 1214   PROT 7.2 03/12/2013 1255   ALBUMIN 3.4* 03/12/2013 1255   AST 17 03/12/2013 1255   ALT 12 03/12/2013 1255   ALKPHOS 119* 03/12/2013 1255   BILITOT 0.3 03/12/2013 1255   GFRNONAA 72* 01/05/2013 1134   GFRAA 83* 01/05/2013 1134

## 2013-06-29 ENCOUNTER — Telehealth: Payer: Self-pay | Admitting: Family Medicine

## 2013-06-29 NOTE — Telephone Encounter (Signed)
Pt notified.  Brodie Scovell L, CMA  

## 2013-06-29 NOTE — Telephone Encounter (Signed)
Please inform pt that INS card [facility] has the wrong PCP's name. Pt needs to call [employer] and change PCP name to Nicky Pugh.  I will canceled referral until pt has a new ins card.  Thank You   [place]

## 2013-07-21 ENCOUNTER — Ambulatory Visit (INDEPENDENT_AMBULATORY_CARE_PROVIDER_SITE_OTHER): Payer: Medicare HMO | Admitting: Family Medicine

## 2013-07-21 VITALS — BP 129/84 | HR 80 | Temp 98.1°F | Wt 217.8 lb

## 2013-07-21 DIAGNOSIS — I10 Essential (primary) hypertension: Secondary | ICD-10-CM

## 2013-07-21 DIAGNOSIS — IMO0002 Reserved for concepts with insufficient information to code with codable children: Secondary | ICD-10-CM

## 2013-07-21 LAB — COMPLETE METABOLIC PANEL WITH GFR
ALBUMIN: 4 g/dL (ref 3.5–5.2)
ALT: 12 U/L (ref 0–35)
AST: 16 U/L (ref 0–37)
Alkaline Phosphatase: 93 U/L (ref 39–117)
BUN: 14 mg/dL (ref 6–23)
CALCIUM: 9.3 mg/dL (ref 8.4–10.5)
CHLORIDE: 104 meq/L (ref 96–112)
CO2: 30 mEq/L (ref 19–32)
Creat: 1.11 mg/dL — ABNORMAL HIGH (ref 0.50–1.10)
GFR, EST AFRICAN AMERICAN: 66 mL/min
GFR, Est Non African American: 58 mL/min — ABNORMAL LOW
Glucose, Bld: 82 mg/dL (ref 70–99)
Potassium: 4.3 mEq/L (ref 3.5–5.3)
Sodium: 140 mEq/L (ref 135–145)
Total Bilirubin: 0.5 mg/dL (ref 0.2–1.2)
Total Protein: 6.7 g/dL (ref 6.0–8.3)

## 2013-07-21 LAB — LIPID PANEL
CHOLESTEROL: 238 mg/dL — AB (ref 0–200)
HDL: 34 mg/dL — ABNORMAL LOW (ref >39)
LDL Cholesterol: 183 mg/dL — ABNORMAL HIGH (ref 0–99)
Total CHOL/HDL Ratio: 7 Ratio
Triglycerides: 106 mg/dL (ref <150)
VLDL: 21 mg/dL (ref 0–40)

## 2013-07-21 NOTE — Progress Notes (Signed)
Patient ID: Kristen [name]    DOB: 20-Nov-1960, 53 y.o.   MRN: [id number] --- Subjective:  Kristen [name] is a 53 y.o.female who presents for follow up on hypertension.  # Hypertension: Medications: losartan 100mg  daily, coreg 12.5mg  bid Number of doses missed in 1 week: 0 BP at home: doesn't check Exercise routine: water aerobics three times a week Salt in diet: low Chest pain: none Lower extremity swelling: none Shortness of breath: none Headache: none Change in vision: none Last BMP and lipid panel:  CMP     Component Value Date/Time   NA 142 03/12/2013 1310   K 4.1 03/12/2013 1310   CL 102 03/12/2013 1310   CO2 28 01/07/2013 1214   GLUCOSE 94 03/12/2013 1310   BUN 6 03/12/2013 1310   CREATININE 1.10 03/12/2013 1310   CREATININE 0.98 01/07/2013 1214   CALCIUM 9.3 01/07/2013 1214   PROT 7.2 03/12/2013 1255   ALBUMIN 3.4* 03/12/2013 1255   AST 17 03/12/2013 1255   ALT 12 03/12/2013 1255   ALKPHOS 119* 03/12/2013 1255   BILITOT 0.3 03/12/2013 1255   GFRNONAA 72* 01/05/2013 1134   GFRAA 83* 01/05/2013 1134    Lipid Panel     Component Value Date/Time   CHOL 292* 09/25/2010 1114   TRIG 115 09/25/2010 1114   HDL 38* 09/25/2010 1114   CHOLHDL 7.7 09/25/2010 1114   VLDL 23 09/25/2010 1114   LDLCALC 231* 09/25/2010 1114    # lower leg paresthesias: started lyrica which has helped with the pain in lower legs. Pain is in the back of her legs, left more than right. Burning sensation. Not worst with walking or standing. Chronic in nature. 5/10. Is able to do chores around house and do shopping.    ROS: see HPI Past Medical History: reviewed and updated medications and allergies. Social History: Tobacco: none  Objective: Filed Vitals:   07/21/13 0946  BP: 129/84  Pulse: 80  Temp: 98.1 F (36.7 C)    Physical Examination:   General appearance - alert, well appearing, and in no distress Neck - supple, no significant adenopathy Chest - clear to auscultation, no wheezes, rales or  rhonchi, symmetric air entry Heart - normal rate, regular rhythm, normal S1, S2, no murmurs Extremities - 1+ dorsalis pedis pulses bilaterally, no pedal edema MSK - no lumbar spine tenderness, normal strength with knee extension, knee flexion, foot dorsiflexion and foot plantarflexion bilaterally.

## 2013-07-21 NOTE — Patient Instructions (Signed)
Everything looks good. Continue with the blood pressure medicine.   Follow up for a PAP smear in May. Otherwise, for the blood pressure, we can go for another 6 months.   I will either call you or send you a letter with your lab results.

## 2013-07-21 NOTE — Assessment & Plan Note (Signed)
Continue lyrica which appears to help.

## 2013-07-21 NOTE — Assessment & Plan Note (Addendum)
Well controled on losartan 100mg  and coreg 12.5mg  bid.  Congratulated her on regular exercise which is also contributing.  Check lipid panel and CMP and A1C.

## 2013-07-26 ENCOUNTER — Telehealth: Payer: Self-pay | Admitting: Family Medicine

## 2013-07-26 NOTE — Telephone Encounter (Signed)
Tried calling patient to discuss results of the labwork, but could not reach her.  Please call patient and let her know of the following:  - cholesterol was high and we recommend that she start taking a medicine for the cholesterol called lipitor. I can send it to the pharmacy if she is ok with it. The side effects include muscle pain and if she experiences this, she can stop it and make appointment to discuss it.  - please return in 3 months for repeat fasting cholesterol labwork to make sure that the medicine is working.  - otherwise, the rest of her labwork looked fine.  Thank you!  Liam Graham, PGY-3 Family Medicine Resident

## 2013-07-27 NOTE — Telephone Encounter (Signed)
Called pt again. Unable to reach pt. Recording states: 'not accepting calls at this time'. Kristen [name], Kristen [name]

## 2013-07-29 NOTE — Telephone Encounter (Signed)
Pt never called back. Unable to reach. No upcoming appt. Javier Glazier, Gerrit Heck

## 2013-09-21 ENCOUNTER — Emergency Department (HOSPITAL_COMMUNITY)
Admission: EM | Admit: 2013-09-21 | Discharge: 2013-09-21 | Disposition: A | Discharge status: Home / Self Care | Payer: Medicare HMO | Attending: Emergency Medicine | Admitting: Emergency Medicine

## 2013-09-21 ENCOUNTER — Encounter (HOSPITAL_COMMUNITY): Payer: Self-pay | Admitting: Emergency Medicine

## 2013-09-21 DIAGNOSIS — M549 Dorsalgia, unspecified: Secondary | ICD-10-CM | POA: Insufficient documentation

## 2013-09-21 DIAGNOSIS — R0789 Other chest pain: Secondary | ICD-10-CM | POA: Insufficient documentation

## 2013-09-21 DIAGNOSIS — Z87891 Personal history of nicotine dependence: Secondary | ICD-10-CM | POA: Insufficient documentation

## 2013-09-21 DIAGNOSIS — Z880 Allergy status to penicillin: Secondary | ICD-10-CM | POA: Insufficient documentation

## 2013-09-21 DIAGNOSIS — IMO0001 Reserved for inherently not codable concepts without codable children: Secondary | ICD-10-CM | POA: Insufficient documentation

## 2013-09-21 DIAGNOSIS — Z780 Asymptomatic menopausal state: Secondary | ICD-10-CM | POA: Insufficient documentation

## 2013-09-21 DIAGNOSIS — M5412 Radiculopathy, cervical region: Secondary | ICD-10-CM

## 2013-09-21 DIAGNOSIS — M6281 Muscle weakness (generalized): Secondary | ICD-10-CM | POA: Insufficient documentation

## 2013-09-21 DIAGNOSIS — Z79899 Other long term (current) drug therapy: Secondary | ICD-10-CM | POA: Insufficient documentation

## 2013-09-21 DIAGNOSIS — E669 Obesity, unspecified: Secondary | ICD-10-CM | POA: Insufficient documentation

## 2013-09-21 DIAGNOSIS — G609 Hereditary and idiopathic neuropathy, unspecified: Secondary | ICD-10-CM | POA: Insufficient documentation

## 2013-09-21 DIAGNOSIS — E785 Hyperlipidemia, unspecified: Secondary | ICD-10-CM | POA: Insufficient documentation

## 2013-09-21 MED ORDER — PREDNISONE 10 MG PO TABS: ORAL_TABLET | ORAL | Status: DC

## 2013-09-21 MED ORDER — OXYCODONE-ACETAMINOPHEN 5-325 MG PO TABS
1.0000 | ORAL_TABLET | Freq: Once | ORAL | Status: AC
  Administered 2013-09-21: 1 via ORAL
  Filled 2013-09-21: qty 1

## 2013-09-21 MED ORDER — NAPROXEN 500 MG PO TABS
500.0000 mg | ORAL_TABLET | Freq: Two times a day (BID) | ORAL | Status: DC
Start: 2013-09-21 — End: 2014-05-30

## 2013-09-21 NOTE — Discharge Instructions (Signed)
Radicular Pain Radicular pain in either the arm or leg is usually from a bulging or herniated disk in the spine. A piece of the herniated disk may press against the nerves as the nerves exit the spine. This causes pain which is felt at the tips of the nerves down the arm or leg. Other causes of radicular pain may include:  Fractures.  Heart disease.  Cancer.  An abnormal and usually degenerative state of the nervous system or nerves (neuropathy). Diagnosis may require CT or MRI scanning to determine the primary cause.  Nerves that start at the neck (nerve roots) may cause radicular pain in the outer shoulder and arm. It can spread down to the thumb and fingers. The symptoms vary depending on which nerve root has been affected. In most cases radicular pain improves with conservative treatment. Neck problems may require physical therapy, a neck collar, or cervical traction. Treatment may take many weeks, and surgery may be considered if the symptoms do not improve.  Conservative treatment is also recommended for sciatica. Sciatica causes pain to radiate from the lower back or buttock area down the leg into the foot. Often there is a history of back problems. Most patients with sciatica are better after 2 to 4 weeks of rest and other supportive care. Short term bed rest can reduce the disk pressure considerably. Sitting, however, is not a good position since this increases the pressure on the disk. You should avoid bending, lifting, and all other activities which make the problem worse. Traction can be used in severe cases. Surgery is usually reserved for patients who do not improve within the first months of treatment. Only take over-the-counter or prescription medicines for pain, discomfort, or fever as directed by your caregiver. Narcotics and muscle relaxants may help by relieving more severe pain and spasm and by providing mild sedation. Cold or massage can give significant relief. Spinal manipulation  is not recommended. It can increase the degree of disc protrusion. Epidural steroid injections are often effective treatment for radicular pain. These injections deliver medicine to the spinal nerve in the space between the protective covering of the spinal cord and back bones (vertebrae). Your caregiver can give you more information about steroid injections. These injections are most effective when given within two weeks of the onset of pain.  You should see your caregiver for follow up care as recommended. A program for neck and back injury rehabilitation with stretching and strengthening exercises is an important part of management.  SEEK IMMEDIATE MEDICAL CARE IF:  You develop increased pain, weakness, or numbness in your arm or leg.  You develop difficulty with bladder or bowel control.  You develop abdominal pain. Document Released: 06/13/2004 Document Revised: 07/29/2011 Document Reviewed: 08/29/2008 [employer] Patient Information 2014 [employer].  

## 2013-09-21 NOTE — ED Provider Notes (Signed)
CSN: [id number]     Arrival date & time 09/21/13  1810 History  This chart was scribed for Etta Quill, NP working with Tanna Furry, MD, by Marcha Dutton ED Scribe. This patient was seen in room TR08C/TR08C and the patient's care was started at 8:04 PM.    Chief Complaint  Patient presents with  . Arm Pain    The history is provided by the patient. No language interpreter was used.   HPI Comments: Kristen [name] is a 53 y.o. female with a h/o HLD, HTN, and ruptured disc who presents to the Emergency Department complaining of gradual onset left chest pain that radiating into her left axilla, shoulder, neck and ear. She states her pain began about 2 weeks ago. Pt denies difficulty moving her neck from side to side but states she does occasionally feel a spasm while turning her head. Pt reports she does not work. Pt denies any injury to the affected area. She states he has difficulty moving her shoulder and arm without pain. She reports an attempt to immobilize her arm without any significant improvements. She also states she takes gabapentin for her pain that she was prescribed for her nerve pain associated with the ruptured disc in her back. Pt reports she has an appointment with her PCP, Liam Graham, MD, on 09/29/2013.    Past Medical History  Diagnosis Date  . Hyperlipidemia   . Hypertension   . Back pain     Seen by Neurosurery  . Peripheral neuropathy   . Allergy   . Obesity   . Post-menopausal   . Leg pain     Bil    Past Surgical History  Procedure Laterality Date  . Back surgery      2009  . Tubal ligation      Family History  Problem Relation Age of Onset  . Lupus Mother   . Heart disease Mother     History  Substance Use Topics  . Smoking status: Former Research scientist (life sciences)  . Smokeless tobacco: Never Used  . Alcohol Use: No    OB History   Grav Para Term Preterm Abortions TAB SAB Ect Mult Living                  Review of Systems  Constitutional: Negative  for chills and fatigue.  Respiratory: Negative for cough, shortness of breath and wheezing.   Cardiovascular: Positive for chest pain.  Gastrointestinal: Negative for abdominal pain.  Musculoskeletal: Positive for arthralgias and myalgias.  Neurological: Positive for weakness. Negative for numbness and headaches.  All other systems reviewed and are negative.   Allergies  Penicillins and Amoxicillin  Home Medications   Prior to Admission medications   Medication Sig Start Date End Date Taking? Authorizing Provider  albuterol (PROVENTIL HFA;VENTOLIN HFA) 108 (90 BASE) MCG/ACT inhaler Inhale 1-2 puffs into the lungs every 6 (six) hours as needed for wheezing or shortness of breath. 04/28/13  Yes Kandis Nab, MD  carvedilol (COREG) 12.5 MG tablet Take 12.5 mg by mouth 2 (two) times daily with a meal.   Yes Historical Provider, MD  cetirizine (ZYRTEC) 10 MG tablet Take 1 tablet (10 mg total) by mouth daily. 01/07/13  Yes Kandis Nab, MD  cyclobenzaprine (FLEXERIL) 10 MG tablet Take 1 tablet (10 mg total) by mouth 3 (three) times daily as needed for muscle spasms. 02/01/13  Yes Kandis Nab, MD  fluticasone (FLONASE) 50 MCG/ACT nasal spray Place 2 sprays into the nose  daily. 01/07/13  Yes Kandis Nab, MD  HYDROcodone-acetaminophen (NORCO/VICODIN) 5-325 MG per tablet Take 1 tablet by mouth 2 (two) times daily as needed for pain. 02/01/13  Yes Kandis Nab, MD  ibuprofen (ADVIL,MOTRIN) 600 MG tablet Take 600 mg by mouth every 6 (six) hours as needed for pain.   Yes Historical Provider, MD  losartan (COZAAR) 100 MG tablet Take 100 mg by mouth daily.   Yes Historical Provider, MD  pantoprazole (PROTONIX) 20 MG tablet Take 1 tablet (20 mg total) by mouth daily. 03/12/13  Yes Domenic Moras, PA-C  pregabalin (LYRICA) 75 MG capsule Take 1 capsule (75 mg total) by mouth 2 (two) times daily. 06/22/13  Yes Kandis Nab, MD   Triage Vitals: BP 143/71  Pulse 94  Temp(Src) 98.3 F (36.8  C) (Oral)  Resp 18  Ht 5\' 3"  (1.6 m)  Wt 227 lb (102.967 kg)  BMI 40.22 kg/m2  SpO2 97%   Physical Exam  Nursing note and vitals reviewed. Constitutional: She is oriented to person, place, and time. She appears well-developed and well-nourished. No distress.  HENT:  Head: Normocephalic and atraumatic.  Mouth/Throat: No oropharyngeal exudate.  Eyes: Conjunctivae and EOM are normal. Pupils are equal, round, and reactive to light. No scleral icterus.  Neck: Normal range of motion. Neck supple. No thyromegaly present.  Cardiovascular: Normal rate and regular rhythm.  Exam reveals no gallop and no friction rub.   No murmur heard. Pulmonary/Chest: Effort normal and breath sounds normal. No stridor. She has no wheezes. She has no rales. She exhibits no tenderness.  Abdominal: Soft. Bowel sounds are normal. She exhibits no distension. There is no tenderness. There is no rebound.  Musculoskeletal: Normal range of motion. She exhibits no edema.  Tenderness to the left trapezius, and decreased ROM with passive and active motion of the left shoulder  Lymphadenopathy:    She has no cervical adenopathy.  Neurological: She is alert and oriented to person, place, and time. She has normal reflexes. She exhibits normal muscle tone. Coordination normal.  Skin: Skin is warm and dry. No rash noted. She is not diaphoretic. No erythema.  Psychiatric: She has a normal mood and affect. Her behavior is normal.    ED Course  Procedures (including critical care time)  DIAGNOSTIC STUDIES: Oxygen Saturation is 97% on RA, adequate by my interpretation.    COORDINATION OF CARE: 8:13 PM- Pt advised of plan for treatment and pt agrees.  Labs Review Labs Reviewed - No data to display   Imaging Review No results found.    EKG Interpretation None      MDM   Final diagnoses:  None    Radicular pain.  Steroids, NSAID.  Has norco at home.  Return precautions discussed.  I personally performed the  services described in this documentation, which was scribed in my presence. The recorded information has been reviewed and is accurate.    Norman Herrlich, NP 09/22/13 [phone]

## 2013-09-21 NOTE — ED Notes (Signed)
Pt presents with Left ear pain radiating to her Left arm x2 weeks, pt has a make shift shoulder sling to her Left arm. Pt denies any injury

## 2013-09-21 NOTE — ED Notes (Signed)
Onset 2 weeks pain in left breast/chest area radiating down left arm.  Also, pain down left side of neck.  No other s/s noted.

## 2013-09-21 NOTE — ED Notes (Signed)
Pt requesting pain pill before being discharged.  NP aware, orders obtained.

## 2013-09-25 NOTE — ED Provider Notes (Signed)
Medical screening examination/treatment/procedure(s) were performed by non-physician practitioner and as supervising physician I was immediately available for consultation/collaboration.   EKG Interpretation None        Tanna Furry, MD 09/25/13 [phone]

## 2013-09-29 ENCOUNTER — Telehealth: Payer: Self-pay | Admitting: Family Medicine

## 2013-09-29 ENCOUNTER — Ambulatory Visit (INDEPENDENT_AMBULATORY_CARE_PROVIDER_SITE_OTHER): Payer: Commercial Managed Care - HMO | Admitting: Family Medicine

## 2013-09-29 ENCOUNTER — Encounter: Payer: Self-pay | Admitting: Family Medicine

## 2013-09-29 VITALS — BP 156/100 | HR 66 | Ht 63.0 in | Wt 228.0 lb

## 2013-09-29 DIAGNOSIS — E785 Hyperlipidemia, unspecified: Secondary | ICD-10-CM

## 2013-09-29 DIAGNOSIS — M25519 Pain in unspecified shoulder: Secondary | ICD-10-CM

## 2013-09-29 DIAGNOSIS — M25512 Pain in left shoulder: Secondary | ICD-10-CM

## 2013-09-29 DIAGNOSIS — R609 Edema, unspecified: Secondary | ICD-10-CM

## 2013-09-29 DIAGNOSIS — R600 Localized edema: Secondary | ICD-10-CM

## 2013-09-29 LAB — CBC
HEMATOCRIT: 39.2 % (ref 36.0–46.0)
Hemoglobin: 12.6 g/dL (ref 12.0–15.0)
MCH: 26.6 pg (ref 26.0–34.0)
MCHC: 32.1 g/dL (ref 30.0–36.0)
MCV: 82.9 fL (ref 78.0–100.0)
Platelets: 273 10*3/uL (ref 150–400)
RBC: 4.73 MIL/uL (ref 3.87–5.11)
RDW: 16 % — AB (ref 11.5–15.5)
WBC: 10.9 10*3/uL — AB (ref 4.0–10.5)

## 2013-09-29 LAB — POCT URINALYSIS DIPSTICK
Bilirubin, UA: NEGATIVE
GLUCOSE UA: NEGATIVE
Ketones, UA: NEGATIVE
Leukocytes, UA: NEGATIVE
Nitrite, UA: NEGATIVE
Protein, UA: NEGATIVE
RBC UA: NEGATIVE
UROBILINOGEN UA: 0.2
pH, UA: 6

## 2013-09-29 MED ORDER — ATORVASTATIN CALCIUM 40 MG PO TABS: 40.0000 mg | ORAL_TABLET | Freq: Every day | ORAL | Status: DC

## 2013-09-29 MED ORDER — LOSARTAN POTASSIUM 100 MG PO TABS: 100.0000 mg | ORAL_TABLET | Freq: Every day | ORAL | Status: DC

## 2013-09-29 MED ORDER — PREGABALIN 75 MG PO CAPS: 75.0000 mg | ORAL_CAPSULE | Freq: Two times a day (BID) | ORAL | Status: DC

## 2013-09-29 NOTE — Telephone Encounter (Signed)
Please let Ms. Hem know that her urine was normal.   Thank you!  Liam Graham, PGY-3 Family Medicine Resident

## 2013-09-29 NOTE — Assessment & Plan Note (Signed)
Likely component of rotator cuff, but patient still has good strength which is reassuring.  - referral to sports medicine for possible [place] eval - change from gabapentin which may be causing some tremor to lyrica which she has tolerated in the past.

## 2013-09-29 NOTE — Assessment & Plan Note (Addendum)
No evidence of DVT since symmetric - check UA for proteinuria - check CBC for anemia - check pro-BNP and echo since patient reports orthopnea and dyspnea: rule out component of heart failure.

## 2013-09-29 NOTE — Patient Instructions (Signed)
We are going to start the lipitor and recheck fasting labs in 4-6 weeks. You can make an appointment with me for that.   I am referring you to the sports medicine doctors for the shoulder.   Start back on the lyrica.   We will be in touch with the lab results. If you haven't heard back from [place] in a week, call us to see about the results.

## 2013-09-29 NOTE — Progress Notes (Signed)
Patient ID: Kristen [name]    DOB: 09-Mar-1961, 53 y.o.   MRN: [id number] --- Subjective:  Kristen [name] is a 53 y.o.female with h/o HLD, HTN, and ruptured disc who presents for follow up on recent ED visit. Was seen on 09/21/13 for left chest pain and shoulder pain.  - shoulder pain: started 3 weeks ago, radiates to the left jaw and beck. Worst with movement. Sharp shooting pain. Better with medicine and elevation of the arm. Worst when doesn't use it. Has had some intermittent numbness and tingling in left hand which has been intermittent.  Had been treated in the ED with prednisone which helped some.   - lower extremity swelling: noticed left more than right, but bilateral. Started a few days ago. Nothing makes it worst. No pain. No erythema. No recent immobilization. She has been getting more short of breath with activity and going up stairs. She reports 2-3 pillow orthopnea.   - tremor of the mouth: started 3 weeks ago when she started gabapentin that she had from a previously unused prescription. No weakness. No pain. Thinks it's related to stress.    ROS: see HPI Past Medical History: reviewed and updated medications and allergies. Social History: Tobacco: former smoker  Objective: Filed Vitals:   09/29/13 1022  BP: 156/100  Pulse: 66    Physical Examination:   General appearance - alert, well appearing, and in no distress Shoulder - tenderness to palpation along anterior and posterior aspect of shoulder as well as along trapezoid insertion at left aspect of neck.  Normal range of motion of neck Normal strength with external and internal rotation, but discomfort present, discomfort with empty can, but normal strength, abduction limited to 100degrees on left, difficulty with reaching behind head.  Chest - clear to auscultation, no wheezes, rales or rhonchi, symmetric air entry Heart - normal rate, regular rhythm, normal S1, S2, no murmurs Extremities - peripheral pulses normal, 2+  pitting pedal edema up to below the knee bilaterally. 39cm diameter calf bilaterally, negative Homan's.

## 2013-09-30 LAB — PRO B NATRIURETIC PEPTIDE: PRO B NATRI PEPTIDE: 77.31 pg/mL (ref <126)

## 2013-09-30 NOTE — Assessment & Plan Note (Signed)
Very elevated LDL.  - will start lipitor 40mg  daily and repeat LDL in 4-6 weeks.

## 2013-10-01 ENCOUNTER — Telehealth: Payer: Self-pay | Admitting: *Deleted

## 2013-10-01 ENCOUNTER — Encounter: Payer: Self-pay | Admitting: Family Medicine

## 2013-10-01 ENCOUNTER — Telehealth: Payer: Self-pay | Admitting: Family Medicine

## 2013-10-01 NOTE — Telephone Encounter (Signed)
Please let patient know that she is not anemic and the lab measuring active heart failure was normal.  She can elevate her legs for the swelling in her legs.   Thank you!  Liam Graham, PGY-3 Family Medicine Resident

## 2013-10-01 NOTE — Telephone Encounter (Signed)
Called and informed patient that she must have her insurance card changed before we can schedule her 2D Echo. It currently has Sara Martinique listed as her PCP and [facility] will not approve the 2D Echo until she gets it changed according to [place]. It must be a faculty member so I instructed Ms Firman to use Dorcas Mcmurray and bring [place] a copy of her card as soon as this is completed so we can get the 2D Echo approved and scheduled.Jaymes Graff [name]

## 2013-10-04 ENCOUNTER — Telehealth: Payer: Self-pay | Admitting: Family Medicine

## 2013-10-04 NOTE — Telephone Encounter (Signed)
Patient went to fill Lyrica and it was $289. Patient would like a cheaper alternative. Please advise.

## 2013-10-04 NOTE — Telephone Encounter (Signed)
Left message for patient to return call. Please let her know her urinalysis was normal.Robert L [name]

## 2013-10-04 NOTE — Telephone Encounter (Signed)
Left message for patient to return call. Please read message from Dr Otis Dials.Kristen [name]

## 2013-10-04 NOTE — Telephone Encounter (Signed)
Called patient and got answering machine. Asked patient to call back.  When patient calls back, please let her know that the alternative to lyrica would be gabapentin. We can try starting her on a low dose gabapentin and see how she tolerates it.  If she would like to do that, let me know and I will send the prescription.  Thank you!  Liam Graham, PGY-3 Family Medicine Resident

## 2013-10-04 NOTE — Telephone Encounter (Signed)
Called patient and got answering machine. Asked patient to call back.  When patient calls back, please let her know that the

## 2013-10-05 NOTE — Telephone Encounter (Signed)
Spoke with patient and informed her of below.Zvi Duplantis K Brixton Schnapp  

## 2013-10-05 NOTE — Telephone Encounter (Signed)
Spoke with patient and gave below message.Kristen [name]

## 2013-10-05 NOTE — Telephone Encounter (Signed)
Spoke with patient and she stated that she is already on gabapentin

## 2013-10-06 ENCOUNTER — Ambulatory Visit: Payer: Commercial Managed Care - HMO | Admitting: Emergency Medicine

## 2013-10-08 ENCOUNTER — Ambulatory Visit: Payer: Commercial Managed Care - HMO | Admitting: Family Medicine

## 2013-10-28 ENCOUNTER — Ambulatory Visit (INDEPENDENT_AMBULATORY_CARE_PROVIDER_SITE_OTHER): Payer: Commercial Managed Care - HMO | Admitting: Family Medicine

## 2013-10-28 ENCOUNTER — Encounter: Payer: Self-pay | Admitting: Family Medicine

## 2013-10-28 VITALS — BP 168/118 | HR 67 | Ht 63.0 in | Wt 220.0 lb

## 2013-10-28 DIAGNOSIS — M542 Cervicalgia: Secondary | ICD-10-CM

## 2013-10-28 DIAGNOSIS — E785 Hyperlipidemia, unspecified: Secondary | ICD-10-CM

## 2013-10-28 DIAGNOSIS — I10 Essential (primary) hypertension: Secondary | ICD-10-CM

## 2013-10-28 LAB — LDL CHOLESTEROL, DIRECT: Direct LDL: 76 mg/dL

## 2013-10-28 MED ORDER — HYDROCHLOROTHIAZIDE 25 MG PO TABS: 25.0000 mg | ORAL_TABLET | Freq: Every day | ORAL | Status: DC

## 2013-10-28 MED ORDER — HYDROCODONE-ACETAMINOPHEN 5-325 MG PO TABS: 1.0000 | ORAL_TABLET | Freq: Two times a day (BID) | ORAL | Status: DC | PRN

## 2013-10-28 MED ORDER — CYCLOBENZAPRINE HCL 10 MG PO TABS: 10.0000 mg | ORAL_TABLET | Freq: Three times a day (TID) | ORAL | Status: DC | PRN

## 2013-10-28 MED ORDER — CARVEDILOL 12.5 MG PO TABS: 12.5000 mg | ORAL_TABLET | Freq: Two times a day (BID) | ORAL | Status: DC

## 2013-10-28 NOTE — Patient Instructions (Signed)
Please follow up in 1 week for the blood pressure.   We will discuss the lab results at that visit.

## 2013-11-01 NOTE — Assessment & Plan Note (Signed)
Stable and unchanged.  Continue vicodin and gabapentin

## 2013-11-01 NOTE — Assessment & Plan Note (Signed)
Uncontrolled on coreg and losartan. No evidence of end organ damage on history and physical exam Will add hctz 25mg  daily Follow up with resident in 1 week for BP and possible labwork.

## 2013-11-01 NOTE — Progress Notes (Signed)
Patient ID: Kristen [name]    DOB: 1961-03-15, 53 y.o.   MRN: [id number] --- Subjective:  Kristen [name] is a 53 y.o.female with h/o HTN, Hyperlipidemia, cervicalgia who presents for follow up.  # neck pain: unchanged from prior. Worst at night, better with sitting up. Numbness and tingling in bilateral hands, no associated weakness. vicodin helps. Takes 1 per day. Taking gabapentin 2 tablets bid. She doesn't know if this has made much of a difference.   # Hypertension: Medications: losartan 100mg  daily, coreg 12.5mg  bid   Number of doses missed in 1 week: 0 BP at home: doesn't check Exercise routine: limited, tries to walk Salt in diet: limited Chest pain: none Lower extremity swelling: none Shortness of breath: none Headache: occasional Change in vision: none   ROS: see HPI Past Medical History: reviewed and updated medications and allergies. Social History: Tobacco: former smoker  Objective: Filed Vitals:   10/28/13 0904  BP: 168/118  Pulse: 67    Physical Examination:   General appearance - alert, well appearing, and in no distress Neck - tenderness to palpation along left trapezius muscle, no cervical tenderness, normal range of motion of neck but some discomfort with lateral neck movement bilaterally Chest - clear to auscultation, no wheezes, rales or rhonchi, symmetric air entry Heart - normal rate, regular rhythm, normal S1, S2, no murmurs Extremities - no pedal edema

## 2013-11-02 ENCOUNTER — Telehealth: Payer: Self-pay | Admitting: Family Medicine

## 2013-11-02 NOTE — Telephone Encounter (Signed)
Please let Ms. Kent that her bad cholesterol LDL is significantly better than it used to be: went from 183 to 76. The lipitor is working great and she should continue taking the medicine like she has been. Great job to her!  Thank you!  Liam Graham, PGY-3 Family Medicine Resident

## 2013-11-02 NOTE — Telephone Encounter (Signed)
Left message to return call. Please read message from Dr Otis Dials to patient.[name], Kristen [name]

## 2013-11-05 ENCOUNTER — Ambulatory Visit (INDEPENDENT_AMBULATORY_CARE_PROVIDER_SITE_OTHER): Payer: Commercial Managed Care - HMO | Admitting: Family Medicine

## 2013-11-05 ENCOUNTER — Encounter: Payer: Self-pay | Admitting: Family Medicine

## 2013-11-05 VITALS — BP 198/100 | HR 65 | Temp 98.3°F

## 2013-11-05 DIAGNOSIS — I10 Essential (primary) hypertension: Secondary | ICD-10-CM

## 2013-11-05 LAB — BASIC METABOLIC PANEL
BUN: 7 mg/dL (ref 6–23)
CHLORIDE: 104 meq/L (ref 96–112)
CO2: 29 mEq/L (ref 19–32)
Calcium: 9.6 mg/dL (ref 8.4–10.5)
Creat: 0.88 mg/dL (ref 0.50–1.10)
GLUCOSE: 89 mg/dL (ref 70–99)
POTASSIUM: 4 meq/L (ref 3.5–5.3)
SODIUM: 141 meq/L (ref 135–145)

## 2013-11-05 MED ORDER — HYDROCHLOROTHIAZIDE 25 MG PO TABS: 25.0000 mg | ORAL_TABLET | Freq: Every day | ORAL | Status: DC

## 2013-11-05 NOTE — Progress Notes (Signed)
Patient ID: JEREMIE ABDELAZIZ    DOB: [birth year], 53 y.o.   MRN: [id number] --- Subjective:  Kristen [name] is a 53 y.o.female with h/o HTN who came for a nurse's visit for BP check and found to be elevated at 198/100.  Patient denies any chest pain, shortness of breath, lower extr swelling, double vision. She reports a left frontal headache that has been ongoing for several weeks.  She has been taking losartan 100mg  and coreg 12.5mg  bid. She was not able to fill the hydrochlorothiazide due to lack of funds. She reports compliance with meds. She doesn't check BP's at home.   ROS: see HPI Past Medical History: reviewed and updated medications and allergies. Social History: Tobacco: former smoker  Objective: Filed Vitals:   11/05/13 0932  BP: 198/100  Pulse: 65  Temp: 98.3 F (36.8 C)   BP recheck: 180/112  Physical Examination:   General appearance - alert, well appearing, and in no distress Neuro - CN2-12 grossly intact, no focal deficits Chest - clear to auscultation, no wheezes, rales or rhonchi, symmetric air entry Heart - normal rate, regular rhythm, normal S1, S2, no murmurs Extremities - no pedal edema Eye: fundoscope exam attempted but unable to properly visualize

## 2013-11-05 NOTE — Patient Instructions (Signed)
We are going to check your kidney function just to make sure it's ok in the setting of high blood pressure.  Please follow up with a doctor for blood pressure at the beginning of next week.

## 2013-11-06 NOTE — Assessment & Plan Note (Signed)
Still uncontrolled. Likely from not taking hctz. No evidence of hypertensive emergency on history and exam - patient to fill hctz 25mg  today - check BMP today to rule out kidney damage - follow up early next week with resident for BP check

## 2013-11-08 ENCOUNTER — Encounter: Payer: Self-pay | Admitting: Family Medicine

## 2013-11-09 ENCOUNTER — Ambulatory Visit (INDEPENDENT_AMBULATORY_CARE_PROVIDER_SITE_OTHER): Payer: Commercial Managed Care - HMO | Admitting: Family Medicine

## 2013-11-09 ENCOUNTER — Encounter: Payer: Self-pay | Admitting: Family Medicine

## 2013-11-09 VITALS — BP 102/68 | HR 80 | Ht 63.0 in | Wt 214.0 lb

## 2013-11-09 DIAGNOSIS — I10 Essential (primary) hypertension: Secondary | ICD-10-CM

## 2013-11-09 NOTE — Progress Notes (Signed)
   Subjective:    Patient ID: Kristen [name], female    DOB: [birth year], 53 y.o.   MRN: [id number]  Hypertension Associated symptoms include neck pain. Pertinent negatives include no chest pain.   CHRONIC HYPERTENSION  Disease Monitoring  Blood pressure range: doesn't check at home, 102/68 today  Chest pain: no   Dyspnea: no   Claudication: no   Medication compliance: yes  Medication Side Effects  Lightheadedness: no   Urinary frequency: yes   Edema: no   Impotence: no   Pt also reports cramps since starting HCTZ, mild and tolerable at this point.   Review of Systems  HENT: Positive for congestion and ear pain.   Cardiovascular: Negative for chest pain.  Endocrine: Positive for polyuria.  Musculoskeletal: Positive for arthralgias and neck pain.  Neurological: Negative for dizziness and light-headedness.  All other systems reviewed and are negative.      Objective:   Physical Exam  Nursing note and vitals reviewed. Constitutional: She is oriented to person, place, and time. She appears well-developed and well-nourished. No distress.  HENT:  Head: Normocephalic and atraumatic.  Eyes: Conjunctivae are normal. Right eye exhibits no discharge. Left eye exhibits no discharge. No scleral icterus.  Cardiovascular: Normal rate, regular rhythm and normal heart sounds.   No murmur heard. Pulmonary/Chest: Effort normal.  Abdominal: Soft. She exhibits no distension. There is no tenderness.  Neurological: She is alert and oriented to person, place, and time.  Skin: Skin is warm and dry. She is not diaphoretic.  Psychiatric: She has a normal mood and affect. Her behavior is normal.          Assessment & Plan:

## 2013-11-09 NOTE — Assessment & Plan Note (Signed)
Well controlled/hypotensive today, 102/68, asymptomatic - likely low related to heat/dehydration, encouraged lots of water - continue current meds (hctz 25, coreg 12.5 bid, losartan 100) - if BP low again would consider decreasing dose as this appears to be causing most side effects (cramps). If BP normal but side effects too bothersome would consider switching

## 2013-11-09 NOTE — Patient Instructions (Signed)
Dehydration, Adult Dehydration is when you lose more fluids from the body than you take in. Vital organs like the kidneys, brain, and heart cannot function without a proper amount of fluids and salt. Any loss of fluids from the body can cause dehydration.  CAUSES   Vomiting.  Diarrhea.  Excessive sweating.  Excessive urine output.  Fever. SYMPTOMS  Mild dehydration  Thirst.  Dry lips.  Slightly dry mouth. Moderate dehydration  Very dry mouth.  Sunken eyes.  Skin does not bounce back quickly when lightly pinched and released.  Dark urine and decreased urine production.  Decreased tear production.  Headache. Severe dehydration  Very dry mouth.  Extreme thirst.  Rapid, weak pulse (more than 100 beats per minute at rest).  Cold hands and feet.  Not able to sweat in spite of heat and temperature.  Rapid breathing.  Blue lips.  Confusion and lethargy.  Difficulty being awakened.  Minimal urine production.  No tears. DIAGNOSIS  Your caregiver will diagnose dehydration based on your symptoms and your exam. Blood and urine tests will help confirm the diagnosis. The diagnostic evaluation should also identify the cause of dehydration. TREATMENT  Treatment of mild or moderate dehydration can often be done at home by increasing the amount of fluids that you drink. It is best to drink small amounts of fluid more often. Drinking too much at one time can make vomiting worse. Refer to the home care instructions below. Severe dehydration needs to be treated at the hospital where you will probably be given intravenous (IV) fluids that contain water and electrolytes. HOME CARE INSTRUCTIONS   Ask your caregiver about specific rehydration instructions.  Drink enough fluids to keep your urine clear or pale yellow.  Drink small amounts frequently if you have nausea and vomiting.  Eat as you normally do.  Avoid:  Foods or drinks high in sugar.  Carbonated  drinks.  Juice.  Extremely hot or cold fluids.  Drinks with caffeine.  Fatty, greasy foods.  Alcohol.  Tobacco.  Overeating.  Gelatin desserts.  Wash your hands well to avoid spreading bacteria and viruses.  Only take over-the-counter or prescription medicines for pain, discomfort, or fever as directed by your caregiver.  Ask your caregiver if you should continue all prescribed and over-the-counter medicines.  Keep all follow-up appointments with your caregiver. SEEK MEDICAL CARE IF:  You have abdominal pain and it increases or stays in one area (localizes).  You have a rash, stiff neck, or severe headache.  You are irritable, sleepy, or difficult to awaken.  You are weak, dizzy, or extremely thirsty. SEEK IMMEDIATE MEDICAL CARE IF:   You are unable to keep fluids down or you get worse despite treatment.  You have frequent episodes of vomiting or diarrhea.  You have blood or green matter (bile) in your vomit.  You have blood in your stool or your stool looks black and tarry.  You have not urinated in 6 to 8 hours, or you have only urinated a small amount of very dark urine.  You have a fever.  You faint. MAKE SURE YOU:   Understand these instructions.  Will watch your condition.  Will get help right away if you are not doing well or get worse. Document Released: 05/06/2005 Document Revised: 07/29/2011 Document Reviewed: 12/24/2010 [employer] Patient Information 2015 [employer]. This information is not intended to replace advice given to you by your health care provider. Make sure you discuss any questions you have with your health care   provider.  

## 2013-11-11 ENCOUNTER — Encounter: Payer: Self-pay | Admitting: Family Medicine

## 2013-12-07 ENCOUNTER — Ambulatory Visit: Payer: Commercial Managed Care - HMO | Admitting: Family Medicine

## 2013-12-12 ENCOUNTER — Emergency Department (HOSPITAL_COMMUNITY)
Admission: EM | Admit: 2013-12-12 | Discharge: 2013-12-12 | Disposition: A | Discharge status: Home / Self Care | Payer: Medicare HMO | Attending: Emergency Medicine | Admitting: Emergency Medicine

## 2013-12-12 ENCOUNTER — Emergency Department (HOSPITAL_COMMUNITY): Payer: Medicare HMO

## 2013-12-12 ENCOUNTER — Encounter (HOSPITAL_COMMUNITY): Payer: Self-pay | Admitting: Emergency Medicine

## 2013-12-12 DIAGNOSIS — Z8669 Personal history of other diseases of the nervous system and sense organs: Secondary | ICD-10-CM | POA: Insufficient documentation

## 2013-12-12 DIAGNOSIS — S79929A Unspecified injury of unspecified thigh, initial encounter: Secondary | ICD-10-CM

## 2013-12-12 DIAGNOSIS — S8990XA Unspecified injury of unspecified lower leg, initial encounter: Secondary | ICD-10-CM | POA: Diagnosis not present

## 2013-12-12 DIAGNOSIS — Z791 Long term (current) use of non-steroidal anti-inflammatories (NSAID): Secondary | ICD-10-CM | POA: Insufficient documentation

## 2013-12-12 DIAGNOSIS — Z880 Allergy status to penicillin: Secondary | ICD-10-CM | POA: Diagnosis not present

## 2013-12-12 DIAGNOSIS — E785 Hyperlipidemia, unspecified: Secondary | ICD-10-CM | POA: Insufficient documentation

## 2013-12-12 DIAGNOSIS — S79919A Unspecified injury of unspecified hip, initial encounter: Secondary | ICD-10-CM | POA: Diagnosis not present

## 2013-12-12 DIAGNOSIS — Z8742 Personal history of other diseases of the female genital tract: Secondary | ICD-10-CM | POA: Insufficient documentation

## 2013-12-12 DIAGNOSIS — R209 Unspecified disturbances of skin sensation: Secondary | ICD-10-CM | POA: Diagnosis not present

## 2013-12-12 DIAGNOSIS — Z79899 Other long term (current) drug therapy: Secondary | ICD-10-CM | POA: Diagnosis not present

## 2013-12-12 DIAGNOSIS — M62838 Other muscle spasm: Secondary | ICD-10-CM | POA: Insufficient documentation

## 2013-12-12 DIAGNOSIS — Y939 Activity, unspecified: Secondary | ICD-10-CM | POA: Diagnosis not present

## 2013-12-12 DIAGNOSIS — Z87891 Personal history of nicotine dependence: Secondary | ICD-10-CM | POA: Diagnosis not present

## 2013-12-12 DIAGNOSIS — S99919A Unspecified injury of unspecified ankle, initial encounter: Secondary | ICD-10-CM | POA: Diagnosis not present

## 2013-12-12 DIAGNOSIS — W19XXXA Unspecified fall, initial encounter: Secondary | ICD-10-CM | POA: Diagnosis not present

## 2013-12-12 DIAGNOSIS — E669 Obesity, unspecified: Secondary | ICD-10-CM | POA: Diagnosis not present

## 2013-12-12 DIAGNOSIS — S99929A Unspecified injury of unspecified foot, initial encounter: Principal | ICD-10-CM

## 2013-12-12 DIAGNOSIS — I10 Essential (primary) hypertension: Secondary | ICD-10-CM | POA: Diagnosis not present

## 2013-12-12 DIAGNOSIS — IMO0002 Reserved for concepts with insufficient information to code with codable children: Secondary | ICD-10-CM | POA: Insufficient documentation

## 2013-12-12 DIAGNOSIS — Y929 Unspecified place or not applicable: Secondary | ICD-10-CM | POA: Insufficient documentation

## 2013-12-12 MED ORDER — CYCLOBENZAPRINE HCL 10 MG PO TABS
10.0000 mg | ORAL_TABLET | Freq: Once | ORAL | Status: AC
Start: 2013-12-12 — End: 2013-12-12
  Administered 2013-12-12: 10 mg via ORAL
  Filled 2013-12-12: qty 1

## 2013-12-12 MED ORDER — CYCLOBENZAPRINE HCL 10 MG PO TABS: 10.0000 mg | ORAL_TABLET | Freq: Two times a day (BID) | ORAL | Status: DC | PRN

## 2013-12-12 NOTE — ED Provider Notes (Signed)
  Medical screening examination/treatment/procedure(s) were performed by non-physician practitioner and as supervising physician I was immediately available for consultation/collaboration.   EKG Interpretation None         Carmin Muskrat, MD 12/12/13 2319

## 2013-12-12 NOTE — Discharge Instructions (Signed)
For pain control you may take up to 800mg  of ibuprofen (that is usually 4 over the counter pills)  3 times a day (take with food) and acetaminophen 975mg  (this is 3 over the counter pills) four times a day. Do not drink alcohol or combine with other medications that have acetaminophen as an ingredient (Read the labels!).    For breakthrough pain you may take Flexeril. Do not drink alcohol, drive or operate heavy machinery when taking Flexeril.  Please follow with your primary care doctor in the next 2 days for a check-up. They must obtain records for further management.   Do not hesitate to return to the Emergency Department for any new, worsening or concerning symptoms.    Muscle Cramps and Spasms Muscle cramps and spasms occur when a muscle or muscles tighten and you have no control over this tightening (involuntary muscle contraction). They are a common problem and can develop in any muscle. The most common place is in the calf muscles of the leg. Both muscle cramps and muscle spasms are involuntary muscle contractions, but they also have differences:   Muscle cramps are sporadic and painful. They may last a few seconds to a quarter of an hour. Muscle cramps are often more forceful and last longer than muscle spasms.  Muscle spasms may or may not be painful. They may also last just a few seconds or much longer. CAUSES  It is uncommon for cramps or spasms to be due to a serious underlying problem. In many cases, the cause of cramps or spasms is unknown. Some common causes are:   Overexertion.   Overuse from repetitive motions (doing the same thing over and over).   Remaining in a certain position for a long period of time.   Improper preparation, form, or technique while performing a sport or activity.   Dehydration.   Injury.   Side effects of some medicines.   Abnormally low levels of the salts and ions in your blood (electrolytes), especially potassium and calcium. This  could happen if you are taking water pills (diuretics) or you are pregnant.  Some underlying medical problems can make it more likely to develop cramps or spasms. These include, but are not limited to:   Diabetes.   Parkinson disease.   Hormone disorders, such as thyroid problems.   Alcohol abuse.   Diseases specific to muscles, joints, and bones.   Blood vessel disease where not enough blood is getting to the muscles.  HOME CARE INSTRUCTIONS   Stay well hydrated. Drink enough water and fluids to keep your urine clear or pale yellow.  It may be helpful to massage, stretch, and relax the affected muscle.  For tight or tense muscles, use a warm towel, heating pad, or hot shower water directed to the affected area.  If you are sore or have pain after a cramp or spasm, applying ice to the affected area may relieve discomfort.  Put ice in a plastic bag.  Place a towel between your skin and the bag.  Leave the ice on for 15-20 minutes, 03-04 times a day.  Medicines used to treat a known cause of cramps or spasms may help reduce their frequency or severity. Only take over-the-counter or prescription medicines as directed by your caregiver. SEEK MEDICAL CARE IF:  Your cramps or spasms get more severe, more frequent, or do not improve over time.  MAKE SURE YOU:   Understand these instructions.  Will watch your condition.  Will get help  right away if you are not doing well or get worse. Document Released: 10/26/2001 Document Revised: 08/31/2012 Document Reviewed: 04/22/2012 [facility] Patient Information 2015 [place], [place]. This information is not intended to replace advice given to you by your health care provider. Make sure you discuss any questions you have with your health care provider.

## 2013-12-12 NOTE — ED Provider Notes (Signed)
CSN: [id number]     Arrival date & time 12/12/13  1506 History   None   This chart was scribed for non-physician practitioner Monico Blitz, PA-C working with Carmin Muskrat, MD, by Thea Alken, ED Scribe. This patient was seen in room WTR8/WTR8 and the patient's care was started at 4:41 PM. Chief Complaint  Patient presents with  . Knee Pain  . thigh pain   . foot numbness    Patient is a 53 y.o. female presenting with knee pain. The history is provided by the patient. No language interpreter was used.  Knee Pain Associated symptoms: no fever    CADANCE RAUS is a 53 y.o. female who presents to the Emergency Department complaining of knee pain and thigh pain x 4 days. Pt reports she fell 4 days ago. Pt reports pain to posterior knee with bearing weight and bending knee . Pt has been taking gabapentin. Pt reports she is unable to walk due to pain.  Patient reports a pins and needles paresthesia to the foot. Denies hip pain.  Past Medical History  Diagnosis Date  . Hyperlipidemia   . Hypertension   . Back pain     Seen by Neurosurery  . Peripheral neuropathy   . Allergy   . Obesity   . Post-menopausal   . Leg pain     Bil   Past Surgical History  Procedure Laterality Date  . Back surgery      2009  . Tubal ligation     Family History  Problem Relation Age of Onset  . Lupus Mother   . Heart disease Mother    History  Substance Use Topics  . Smoking status: Former Research scientist (life sciences)  . Smokeless tobacco: Never Used  . Alcohol Use: No   OB History   Grav Para Term Preterm Abortions TAB SAB Ect Mult Living                 Review of Systems  Constitutional: Negative for fever and chills.  Musculoskeletal: Positive for gait problem and myalgias.  Neurological: Negative for weakness and numbness.  All other systems reviewed and are negative.  Allergies  Penicillins and Amoxicillin  Home Medications   Prior to Admission medications   Medication Sig Start Date End  Date Taking? Authorizing Provider  albuterol (PROVENTIL HFA;VENTOLIN HFA) 108 (90 BASE) MCG/ACT inhaler Inhale 1-2 puffs into the lungs every 6 (six) hours as needed for wheezing or shortness of breath. 04/28/13   Kandis Nab, MD  atorvastatin (LIPITOR) 40 MG tablet Take 1 tablet (40 mg total) by mouth daily. 09/29/13   Kandis Nab, MD  carvedilol (COREG) 12.5 MG tablet Take 1 tablet (12.5 mg total) by mouth 2 (two) times daily with a meal. 10/28/13   Kandis Nab, MD  cetirizine (ZYRTEC) 10 MG tablet Take 1 tablet (10 mg total) by mouth daily. 01/07/13   Kandis Nab, MD  cyclobenzaprine (FLEXERIL) 10 MG tablet Take 1 tablet (10 mg total) by mouth 3 (three) times daily as needed for muscle spasms. 02/01/13   Kandis Nab, MD  cyclobenzaprine (FLEXERIL) 10 MG tablet Take 1 tablet (10 mg total) by mouth 3 (three) times daily as needed for muscle spasms. 10/28/13   Kandis Nab, MD  cyclobenzaprine (FLEXERIL) 10 MG tablet Take 1 tablet (10 mg total) by mouth 2 (two) times daily as needed for muscle spasms. 12/12/13   Meda Dudzinski, PA-C  fluticasone (FLONASE) 50 MCG/ACT nasal spray Place  2 sprays into the nose daily. 01/07/13   Kandis Nab, MD  hydrochlorothiazide (HYDRODIURIL) 25 MG tablet Take 1 tablet (25 mg total) by mouth daily. 11/05/13   Kandis Nab, MD  HYDROcodone-acetaminophen (NORCO/VICODIN) 5-325 MG per tablet Take 1 tablet by mouth 2 (two) times daily as needed. 10/28/13   Kandis Nab, MD  ibuprofen (ADVIL,MOTRIN) 600 MG tablet Take 600 mg by mouth every 6 (six) hours as needed for pain.    Historical Provider, MD  losartan (COZAAR) 100 MG tablet Take 1 tablet (100 mg total) by mouth daily. 09/29/13   Kandis Nab, MD  naproxen (NAPROSYN) 500 MG tablet Take 1 tablet (500 mg total) by mouth 2 (two) times daily. 09/21/13   Norman Herrlich, NP  pantoprazole (PROTONIX) 20 MG tablet Take 1 tablet (20 mg total) by mouth daily. 03/12/13   Domenic Moras, PA-C   pregabalin (LYRICA) 75 MG capsule Take 1 capsule (75 mg total) by mouth 2 (two) times daily. 09/29/13   Kandis Nab, MD   BP 106/57  Pulse 92  Temp(Src) 98.4 F (36.9 C) (Oral)  Resp 20  SpO2 98% Physical Exam  Nursing note and vitals reviewed. Constitutional: She is oriented to person, place, and time. She appears well-developed and well-nourished. No distress.  HENT:  Head: Normocephalic and atraumatic.  Mouth/Throat: Oropharynx is clear and moist.  Eyes: Conjunctivae and EOM are normal. Pupils are equal, round, and reactive to light.  Neck: Normal range of motion.  Cardiovascular: Normal rate, regular rhythm and intact distal pulses.   Pulmonary/Chest: Effort normal and breath sounds normal. No stridor. No respiratory distress. She has no wheezes. She has no rales. She exhibits no tenderness.  Abdominal: Soft. Bowel sounds are normal. She exhibits no distension and no mass. There is no tenderness. There is no rebound and no guarding.  Musculoskeletal: Normal range of motion.  No tenderness to palpation of the greater trochanter on the right side.  Patient has spasm and tenderness palpation of the right hand strain lateral side. Distally neurovascularly intact.  Right knee with No deformity, erythema or abrasions. FROM. No effusion or crepitance. Anterior and posterior drawer show no abnormal laxity. Stable to valgus and varus stress. Joint lines are non-tender. Neurovascularly intact. Pt ambulates with non-antalgic gait.    Neurological: She is alert and oriented to person, place, and time.  Skin: Skin is warm.  Psychiatric: She has a normal mood and affect.    ED Course  Procedures DIAGNOSTIC STUDIES: Oxygen Saturation is 98% on RA, normal by my interpretation.    COORDINATION OF CARE: 4:50 PM- Pt advised of plan for treatment which includes a muscle relaxer and pt agrees.  Labs Review Labs Reviewed - No data to display  Imaging Review Dg Knee Complete 4 Views  Right  12/12/2013   CLINICAL DATA:  KNEE PAIN  EXAM: RIGHT KNEE - COMPLETE 4+ VIEW  COMPARISON:  None.  FINDINGS: Knee held in partial flexion limiting ideal positioning. There is no evidence of fracture, dislocation, or joint effusion. There is no evidence of arthropathy or other focal bone abnormality. Soft tissues are unremarkable.  IMPRESSION: Negative.   Electronically Signed   By: Arne Cleveland M.D.   On: 12/12/2013 15:54     EKG Interpretation None      MDM   Final diagnoses:  Muscle spasm of right leg   Filed Vitals:   12/12/13 1521  BP: 106/57  Pulse: 92  Temp: 98.4 F (36.9  C)  TempSrc: Oral  Resp: 20  SpO2: 98%    Medications  cyclobenzaprine (FLEXERIL) tablet 10 mg (not administered)    DREAMER CARILLO is a 53 y.o. female presenting with pain to right hamstring. Muscle is in spasm. Patient is very dramatic. Will give crutches and recommend heat and muscle relaxers. Patient states that she has a orthopedist that she cannot remember his name. I've advised her to follow with orthopedics this week. Patient verbalized her understanding.  Evaluation does not show pathology that would require ongoing emergent intervention or inpatient treatment. Pt is hemodynamically stable and mentating appropriately. Discussed findings and plan with patient/guardian, who agrees with care plan. All questions answered. Return precautions discussed and outpatient follow up given.   New Prescriptions   CYCLOBENZAPRINE (FLEXERIL) 10 MG TABLET    Take 1 tablet (10 mg total) by mouth 2 (two) times daily as needed for muscle spasms.       I personally performed the services described in this documentation, which was scribed in my presence. The recorded information has been reviewed and is accurate.      Monico Blitz, PA-C 12/12/13 1656

## 2013-12-12 NOTE — ED Notes (Signed)
Pt reports she fell on Wednesday, then started having right knee pain, right thigh pain, and foot numbness. Reports she had a ruptured disk in her back in 2009 with similar symptoms. Pain 10/10.

## 2013-12-21 ENCOUNTER — Ambulatory Visit (INDEPENDENT_AMBULATORY_CARE_PROVIDER_SITE_OTHER): Payer: Commercial Managed Care - HMO | Admitting: Family Medicine

## 2013-12-21 ENCOUNTER — Encounter: Payer: Self-pay | Admitting: Family Medicine

## 2013-12-21 VITALS — BP 123/85 | HR 120 | Temp 98.2°F | Ht 63.0 in | Wt 222.6 lb

## 2013-12-21 DIAGNOSIS — S79929A Unspecified injury of unspecified thigh, initial encounter: Secondary | ICD-10-CM

## 2013-12-21 DIAGNOSIS — M25569 Pain in unspecified knee: Secondary | ICD-10-CM

## 2013-12-21 DIAGNOSIS — S76301A Unspecified injury of muscle, fascia and tendon of the posterior muscle group at thigh level, right thigh, initial encounter: Secondary | ICD-10-CM

## 2013-12-21 DIAGNOSIS — M25561 Pain in right knee: Secondary | ICD-10-CM

## 2013-12-21 DIAGNOSIS — S79919A Unspecified injury of unspecified hip, initial encounter: Secondary | ICD-10-CM

## 2013-12-21 MED ORDER — TRAMADOL HCL 50 MG PO TABS: 50.0000 mg | ORAL_TABLET | Freq: Three times a day (TID) | ORAL | Status: DC | PRN

## 2013-12-21 NOTE — Patient Instructions (Signed)
Pain in right leg - suspect possible tear of hamstring muscle, you will be referred to sports medicine for an ultrasound of the area, continue Motrin (up to 600 mg three times a day), ice the affected area 2-3 times per day for at least 15 minutes, may also attempt trial of heat to the area

## 2013-12-22 DIAGNOSIS — S76309A Unspecified injury of muscle, fascia and tendon of the posterior muscle group at thigh level, unspecified thigh, initial encounter: Secondary | ICD-10-CM | POA: Insufficient documentation

## 2013-12-22 NOTE — Assessment & Plan Note (Signed)
Patient presents with pain in posterior right hamstring area. Suspect partial tear based on examination. -Will send to sports medicine for [place] evaluation -Conservative management discussed including rest/elevation/ice -Short supply of Tramadol provided to help with pain -She can continue Motrin (up to 600 mg TID)

## 2013-12-22 NOTE — Progress Notes (Signed)
   Subjective:    Patient ID: Kristen [name], female    DOB: Feb 03, 1961, 53 y.o.   MRN: [id number]  HPI 53 y/o female presents with right posterior thigh pain, has been present for 3 weeks, noticed the pain after she fell at home, no associated loc/syncope/chest pain, she is uncertain how she landed, denies loud pop during injury, denies current knee pain, having difficulty ambulating, uses a cane, taking Motrin 200 mg (up to 10 times per day).  Seen in ED on 12/12/13, xrays of knee negative, exam of knee unremarkable, thought to be muscle strain and given Flexeril, no relief of symptoms with this medication, she has not been icing or applying heat regularly  Patient has right LE neuropathy from previous back surgery however no change in numbness   Review of Systems  Constitutional: Negative for fever, chills and fatigue.       Objective:   Physical Exam Vital: reviewed MSK: tenderness to palpation over right posterior thigh (diffusely over hamstrings), no knee tenderness, no knee swelling/erythema, ligament testing of right knee including lachman/varus stress/valgus stress negative, negative McMurry's of right knee, right log roll negative for hip pain, right FABER/FADIR negative for pain Neuro: RLE - 5/5 hip flexion, 5/5 leg extension, 4/5 leg flexion (limited by pain); LLE- strength 5/5     Assessment & Plan:  Please see problem specific assessment and plan.

## 2013-12-27 ENCOUNTER — Other Ambulatory Visit: Payer: Self-pay

## 2013-12-27 DIAGNOSIS — Z1231 Encounter for screening mammogram for malignant neoplasm of breast: Secondary | ICD-10-CM

## 2014-02-10 ENCOUNTER — Ambulatory Visit (INDEPENDENT_AMBULATORY_CARE_PROVIDER_SITE_OTHER): Payer: Commercial Managed Care - HMO | Admitting: Family Medicine

## 2014-02-10 ENCOUNTER — Encounter: Payer: Self-pay | Admitting: Family Medicine

## 2014-02-10 VITALS — BP 147/99 | HR 73 | Temp 97.7°F | Ht 63.0 in | Wt 212.9 lb

## 2014-02-10 DIAGNOSIS — M25519 Pain in unspecified shoulder: Secondary | ICD-10-CM

## 2014-02-10 DIAGNOSIS — I10 Essential (primary) hypertension: Secondary | ICD-10-CM

## 2014-02-10 DIAGNOSIS — M25512 Pain in left shoulder: Secondary | ICD-10-CM

## 2014-02-10 DIAGNOSIS — M5440 Lumbago with sciatica, unspecified side: Secondary | ICD-10-CM

## 2014-02-10 DIAGNOSIS — M79605 Pain in left leg: Secondary | ICD-10-CM

## 2014-02-10 DIAGNOSIS — M5430 Sciatica, unspecified side: Secondary | ICD-10-CM

## 2014-02-10 DIAGNOSIS — M79604 Pain in right leg: Secondary | ICD-10-CM

## 2014-02-10 DIAGNOSIS — M545 Low back pain, unspecified: Secondary | ICD-10-CM

## 2014-02-10 DIAGNOSIS — M79609 Pain in unspecified limb: Secondary | ICD-10-CM

## 2014-02-10 MED ORDER — AMLODIPINE BESYLATE 5 MG PO TABS: 5.0000 mg | ORAL_TABLET | Freq: Every day | ORAL | Status: DC

## 2014-02-10 MED ORDER — GABAPENTIN 100 MG PO CAPS: 100.0000 mg | ORAL_CAPSULE | Freq: Three times a day (TID) | ORAL | Status: DC

## 2014-02-10 MED ORDER — MELOXICAM 15 MG PO TABS: 15.0000 mg | ORAL_TABLET | Freq: Every day | ORAL | Status: DC

## 2014-02-10 NOTE — Patient Instructions (Addendum)
Dear Kristen [name], Thank you for coming in to clinic today. It was good to see you!  Today we discussed your Blood Pressure, Shoulder and Leg Pain. 1. For your Blood Pressure - Stop taking Coreg. Start taking Amlodipine 5mg  daily (we may increase this to 10mg  daily if needed) - Take your BP meds in the morning - every day - Reduce caffeine (coffee even decaf, sodas) - Start walking for exercise up to 15 min per time, 3x weekly, can resume water aerobics if needed 2. For your Shoulder pain, sent prescription for Mobic 15mg  daily - take once daily with food. You may also take Tylenol extra strength 1 to 2 tablets every 6 hours up to 3 times daily (take no more than 8 tablets) - Continue motion exercises 3. For leg pain, we increased Gabapentin to 100mg  capsule 3 times daily, if needed over next few weeks, you may increase night dose to 200mg  then 300mg  (2 to 3 capsules)   Please schedule a follow-up appointment with Dr. Lorenso Courier in 3 months to follow-up BP and Shoulder Pain.  If you have any other questions or concerns, please feel free to call the clinic to contact me. You may also schedule an earlier appointment if necessary.  However, if your symptoms get significantly worse, please go to the Emergency Department to seek immediate medical attention.  Kristen [name], [place]

## 2014-02-10 NOTE — Progress Notes (Signed)
   Subjective:    Patient ID: Kristen [name], female    DOB: 1960-09-16, 53 y.o.   MRN: [id number]  Patient presents for annual physical.  HPI  CHRONIC HTN: Reports - Does not check BP home. 1 month ago at last visit previously resumed Coreg 12.5mg , however she has since self-discontinued due to cough, attributed this to Coreg, cough seems to have resolved over pas few weeks since discontinued. Additionally states she takes her BP meds at night Current Meds - HCTZ 25mg  daily, Losartan 100mg  daily Reports good compliance, did not take due to office visit. Tolerating well, w/o complaints. Lifestyle - Previously water aerobics, since limited due to back pain. Does not like this anymore, but interested in starting walking for exercise Drinks some caffeine, sodas up to 2x daily and some decaf coffee Admits to dry cough, since resolved Denies CP, dyspnea, HA, edema, dizziness / lightheadedness  CHRONIC LBP / LEG PAIN: - Hx Back surgery 2009 (L4-5 Laminectomy), hx ruptured disc and reported hx arthritis - Described as constant low back ache, with "nagging shooting pain" down legs - Improves with activity, worse with prolonged sitting / standing - Currently taking Gabapentin 100mg  twice daily - Given Tramadol recently, not tolerated due to nausea  Left Arm Pain: - Reports hx 3-4 months, no injury, sudden onset, described as "nagging pain" in Left upper arm and shoulder, with some radiation and sharp pains from Left neck down left shoulder and into arm. Never seen anyone for this before. Does not seem to be limiting daily function. - Taking Aleve 500mg  twice sometimes as needed, not regularly - Currently doing ROM exercises without improvement - Denies weakness, numbness, tingling in arms or legs  Health Maintenance: - Had colonoscopy 09/2012 (results with mild diverticulosis, 4x polyp removal, rec repeat in 3 years) - Due for Pap Smear, will schedule in future - Declined influenza  vaccine  I have reviewed and updated the following as appropriate: allergies and current medications  Social Hx: - Former quit 1980s - No alcohol  Review of Systems  See above HPI    Objective:   Physical Exam  BP 147/99  Pulse 73  Temp(Src) 97.7 F (36.5 C) (Oral)  Ht 5\' 3"  (1.6 m)  Wt 212 lb 14.4 oz (96.571 kg)  BMI 37.72 kg/m2  Gen - well-appearing, obese, pleasant, NAD HEENT - NCAT, PERRL, EOMI, oropharynx clear, MMM Neck - supple, non-tender Heart - RRR, no murmurs heard Lungs - CTAB. Normal work of breathing MSK - Back: non-tender over spinous processes, symmetrical without deformity, mild tenderness over bilateral lumbar / SI region without muscle spasm. Seated slump SLR negative for radicular symptoms. Left Shoulder - full ROM including forward flexion over head, and internal rotation behind back with mild discomfort, supraspinatus testing with mild discomfort but normal strength, empty can labral testing with similar discomfort, +positive Hawkins impingement testing Ext - non-tender, no edema, peripheral pulses intact +2 b/l Skin - warm, dry Neuro - awake, alert, oriented, grossly non-focal, intact muscle strength 5/5 b/l  Distal ext grip and ankles, intact distal sensation to light touch, gait normal     Assessment & Plan:   See specific A&P problem list for details.

## 2014-02-11 NOTE — Assessment & Plan Note (Addendum)
Subacute worsening left shoulder pain, suspect component of subacromial bursitis due to possible degenerative disease vs possible rotator cuff injury (do not suspect tear, given appropriate strength and function)  Plan: 1. Start Mobic 15mg  daily, last BMET 3 months ago with normal stable Cr 2. Discontinue all other NSAIDs 3. Start Tylenol PRN pain 4. Continue ROM shoulder exercises 5. If no improvement, consider X-rays, may confirm arthritis and likely bursitis, otherwise may follow-up with Sports Med and PT

## 2014-02-11 NOTE — Assessment & Plan Note (Addendum)
Stable, chronic low back pain without acute change today - Improved on Gabapentin  Plan: 1. Increase Gabapentin to 100mg  TID, may continue to titrate night Gabapentin dose up to 200-300mg  if needed 2. Stay active, exercises 3. Follow-up PRN

## 2014-02-11 NOTE — Assessment & Plan Note (Addendum)
Moderate HTN control, mildly elevated BP, overall stable to significantly improved from previous  No complications  Plan:  1. Start Amlodipine 5mg  daily (consider inc to 10mg  if needed), Discontinue Coreg due to cough. Continue HCTZ, Losartan  2. No labs due - last BMET 10/2013 with stable Cr 3. Lifestyle Mods - Goal for inc walking for exercise, Decrease caffeine 4. Monitor BP at home or at drug store occasionally.

## 2014-03-01 ENCOUNTER — Telehealth: Payer: Self-pay | Admitting: *Deleted

## 2014-03-01 ENCOUNTER — Other Ambulatory Visit: Payer: Self-pay | Admitting: Family Medicine

## 2014-03-01 DIAGNOSIS — Z1231 Encounter for screening mammogram for malignant neoplasm of breast: Secondary | ICD-10-CM

## 2014-03-01 NOTE — Telephone Encounter (Signed)
Patient completed form to change provider on 02/10/14.  Reason stated--"Need to change doctors because of the doctor that was assigned to me only do afternoon appointments.  I need morning appointments."    02/15/14--Called patient and left message to call our office back.  Burna Forts, BSN, RN-BC  03/01/14--Patient returned call to our office.  Explained that [employer] is a residency teaching program.  Patient initially saw Dr. Otis Dials for 3 years and I explained to patient that Dr. Libby Maw availability will increase as she advances through program the same as Dr. Marella Chimes did.  Patient verbalized understanding and will follow up with me if no improvement with availability after Dr. Lorenso Courier completes her first year.  Patient also requesting mammogram appt to [facility].  Contacted [facility] and scheduled appt for 03/23/14 @ 8:50 am.  Called and informed patient of appt.  Burna Forts, BSN, RN-BC

## 2014-03-23 ENCOUNTER — Ambulatory Visit: Payer: Commercial Managed Care - HMO

## 2014-04-06 ENCOUNTER — Encounter (HOSPITAL_COMMUNITY): Payer: Self-pay | Admitting: Emergency Medicine

## 2014-04-06 ENCOUNTER — Emergency Department (HOSPITAL_COMMUNITY): Payer: Medicare HMO

## 2014-04-06 ENCOUNTER — Emergency Department (HOSPITAL_COMMUNITY)
Admission: EM | Admit: 2014-04-06 | Discharge: 2014-04-06 | Disposition: A | Discharge status: Home / Self Care | Payer: Medicare HMO | Attending: Emergency Medicine | Admitting: Emergency Medicine

## 2014-04-06 DIAGNOSIS — Z791 Long term (current) use of non-steroidal anti-inflammatories (NSAID): Secondary | ICD-10-CM | POA: Diagnosis not present

## 2014-04-06 DIAGNOSIS — M791 Myalgia: Secondary | ICD-10-CM | POA: Diagnosis not present

## 2014-04-06 DIAGNOSIS — Z79899 Other long term (current) drug therapy: Secondary | ICD-10-CM | POA: Insufficient documentation

## 2014-04-06 DIAGNOSIS — I10 Essential (primary) hypertension: Secondary | ICD-10-CM | POA: Diagnosis not present

## 2014-04-06 DIAGNOSIS — Z7951 Long term (current) use of inhaled steroids: Secondary | ICD-10-CM | POA: Diagnosis not present

## 2014-04-06 DIAGNOSIS — R51 Headache: Secondary | ICD-10-CM | POA: Insufficient documentation

## 2014-04-06 DIAGNOSIS — Z87891 Personal history of nicotine dependence: Secondary | ICD-10-CM | POA: Insufficient documentation

## 2014-04-06 DIAGNOSIS — E669 Obesity, unspecified: Secondary | ICD-10-CM | POA: Diagnosis not present

## 2014-04-06 DIAGNOSIS — G629 Polyneuropathy, unspecified: Secondary | ICD-10-CM | POA: Diagnosis not present

## 2014-04-06 DIAGNOSIS — Z880 Allergy status to penicillin: Secondary | ICD-10-CM | POA: Diagnosis not present

## 2014-04-06 DIAGNOSIS — E785 Hyperlipidemia, unspecified: Secondary | ICD-10-CM | POA: Diagnosis not present

## 2014-04-06 DIAGNOSIS — H539 Unspecified visual disturbance: Secondary | ICD-10-CM | POA: Insufficient documentation

## 2014-04-06 DIAGNOSIS — R519 Headache, unspecified: Secondary | ICD-10-CM

## 2014-04-06 MED ORDER — DIPHENHYDRAMINE HCL 25 MG PO CAPS
25.0000 mg | ORAL_CAPSULE | Freq: Once | ORAL | Status: AC
  Administered 2014-04-06: 25 mg via ORAL
  Filled 2014-04-06: qty 1

## 2014-04-06 MED ORDER — HYDROCODONE-ACETAMINOPHEN 5-325 MG PO TABS
1.0000 | ORAL_TABLET | Freq: Once | ORAL | Status: AC
  Administered 2014-04-06: 1 via ORAL
  Filled 2014-04-06: qty 1

## 2014-04-06 MED ORDER — SODIUM CHLORIDE 0.9 % IV BOLUS (SEPSIS): 500.0000 mL | Freq: Once | INTRAVENOUS | Status: DC

## 2014-04-06 MED ORDER — DIPHENHYDRAMINE HCL 50 MG/ML IJ SOLN: 25.0000 mg | Freq: Once | INTRAMUSCULAR | Status: DC

## 2014-04-06 MED ORDER — METOCLOPRAMIDE HCL 5 MG/ML IJ SOLN
10.0000 mg | Freq: Once | INTRAMUSCULAR | Status: DC
Start: 2014-04-06 — End: 2014-04-06

## 2014-04-06 MED ORDER — METOCLOPRAMIDE HCL 5 MG/ML IJ SOLN
10.0000 mg | Freq: Once | INTRAMUSCULAR | Status: AC
  Administered 2014-04-06: 10 mg via INTRAMUSCULAR
  Filled 2014-04-06: qty 2

## 2014-04-06 NOTE — Discharge Instructions (Signed)
If you were given medicines take as directed.  If you are on coumadin or contraceptives realize their levels and effectiveness is altered by many different medicines.  If you have any reaction (rash, tongues swelling, other) to the medicines stop taking and see a physician.   Please follow up as directed and return to the ER or see a physician for new or worsening symptoms.  Thank you. Filed Vitals:   04/06/14 1147  BP: 140/87  Pulse: 80  Temp: 98.3 F (36.8 C)  TempSrc: Oral  Resp: 18  SpO2: 96%

## 2014-04-06 NOTE — ED Notes (Signed)
Pt c/o headache that has been constant over the past 2 weeks.  Pt denies n/v, but does state that she has had blurred vision.

## 2014-04-06 NOTE — ED Notes (Signed)
Pt no longer in room. Registration left slip in room for DC. Pt left without paperwork

## 2014-04-06 NOTE — ED Provider Notes (Signed)
CSN: [id number]     Arrival date & time 04/06/14  1140 History   First MD Initiated Contact with Patient 04/06/14 1213     Chief Complaint  Patient presents with  . Headache     (Consider location/radiation/quality/duration/timing/severity/associated sxs/prior Treatment) HPI Comments: 53 year old female with history of obesity, neuropathy, sleep apnea presents with headache for the past 2 weeks. Fairly constant. Patient feels starts in the paraspinal neck region extends posteriorly and bilateral parietal region. This is different than previous and longer than previous headaches. Gradual onset. No focal neuro deficits, mild transient blurry vision.  No history of intracranial issues.  Patient is a 52 y.o. female presenting with headaches. The history is provided by the patient.  Headache Associated symptoms: no abdominal pain, no back pain, no congestion, no pain, no fever, no neck pain, no neck stiffness, no numbness and no vomiting     Past Medical History  Diagnosis Date  . Hyperlipidemia   . Hypertension   . Back pain     Seen by Neurosurery  . Peripheral neuropathy   . Allergy   . Obesity   . Post-menopausal   . Leg pain     Bil   Past Surgical History  Procedure Laterality Date  . Back surgery      2009  . Tubal ligation     Family History  Problem Relation Age of Onset  . Lupus Mother   . Heart disease Mother    History  Substance Use Topics  . Smoking status: Former Research scientist (life sciences)  . Smokeless tobacco: Never Used  . Alcohol Use: No   OB History    No data available     Review of Systems  Constitutional: Negative for fever and chills.  HENT: Negative for congestion.   Eyes: Positive for visual disturbance. Negative for pain.  Respiratory: Negative for shortness of breath.   Cardiovascular: Negative for chest pain.  Gastrointestinal: Negative for vomiting and abdominal pain.  Genitourinary: Negative for dysuria and flank pain.  Musculoskeletal: Negative for  back pain, neck pain and neck stiffness.  Skin: Negative for rash.  Neurological: Positive for headaches. Negative for weakness, light-headedness and numbness.      Allergies  Penicillins and Amoxicillin  Home Medications   Prior to Admission medications   Medication Sig Start Date End Date Taking? Authorizing Provider  albuterol (PROVENTIL HFA;VENTOLIN HFA) 108 (90 BASE) MCG/ACT inhaler Inhale 1-2 puffs into the lungs every 6 (six) hours as needed for wheezing or shortness of breath. 04/28/13  Yes Kandis Nab, MD  amLODipine (NORVASC) 5 MG tablet Take 1 tablet (5 mg total) by mouth daily. 02/10/14  Yes Alexander Parks Ranger, DO  atorvastatin (LIPITOR) 40 MG tablet Take 1 tablet (40 mg total) by mouth daily. 09/29/13  Yes Kandis Nab, MD  cetirizine (ZYRTEC) 10 MG tablet Take 1 tablet (10 mg total) by mouth daily. 01/07/13  Yes Kandis Nab, MD  fluticasone (FLONASE) 50 MCG/ACT nasal spray Place 2 sprays into the nose daily. 01/07/13  Yes Kandis Nab, MD  gabapentin (NEURONTIN) 100 MG capsule Take 1 capsule (100 mg total) by mouth 3 (three) times daily. Patient taking differently: Take 100 mg by mouth 2 (two) times daily.  02/10/14  Yes Alexander Parks Ranger, DO  hydrochlorothiazide (HYDRODIURIL) 25 MG tablet Take 1 tablet (25 mg total) by mouth daily. 11/05/13  Yes Kandis Nab, MD  losartan (COZAAR) 100 MG tablet Take 1 tablet (100 mg total) by mouth daily. 09/29/13  Yes  Kandis Nab, MD  naproxen (NAPROSYN) 500 MG tablet Take 1 tablet (500 mg total) by mouth 2 (two) times daily. 09/21/13  Yes Norman Herrlich, NP  ibuprofen (ADVIL,MOTRIN) 600 MG tablet Take 600 mg by mouth every 6 (six) hours as needed for pain.    Historical Provider, MD  meloxicam (MOBIC) 15 MG tablet Take 1 tablet (15 mg total) by mouth daily. 02/10/14   Nobie Putnam, DO  pantoprazole (PROTONIX) 20 MG tablet Take 1 tablet (20 mg total) by mouth daily. 03/12/13   Domenic Moras, PA-C   BP 143/81  mmHg  Pulse 70  Temp(Src) 98.1 F (36.7 C) (Oral)  Resp 18  SpO2 96% Physical Exam  Constitutional: She is oriented to person, place, and time. She appears well-developed and well-nourished.  HENT:  Head: Normocephalic and atraumatic.  Eyes: Conjunctivae are normal. Right eye exhibits no discharge. Left eye exhibits no discharge.  Neck: Normal range of motion. Neck supple. No tracheal deviation present.  Cardiovascular: Normal rate and regular rhythm.   Pulmonary/Chest: Effort normal and breath sounds normal.  Abdominal: Soft. She exhibits no distension. There is no tenderness. There is no guarding.  Musculoskeletal: She exhibits no edema.  Patient has tight and tender trapezius musculature bilateral, neck supple full range of motion. No focal temple tenderness  Neurological: She is alert and oriented to person, place, and time.  5+ strength in UE and LE with f/e at major joints. Sensation to palpation intact in UE and LE. CNs 2-12 grossly intact.  EOMFI.  PERRL.   Finger nose and coordination intact bilateral.   Visual fields intact to finger testing.   Skin: Skin is warm. No rash noted.  Psychiatric: She has a normal mood and affect.  Nursing note and vitals reviewed.   ED Course  Procedures (including critical care time) Labs Review Labs Reviewed - No data to display  Imaging Review Ct Head Wo Contrast  04/06/2014   CLINICAL DATA:  53 year old with complaints of headache over the past 2 weeks.  EXAM: CT HEAD WITHOUT CONTRAST  TECHNIQUE: Contiguous axial images were obtained from the base of the skull through the vertex without intravenous contrast.  COMPARISON:  None.  FINDINGS: The gray-white differentiation is preserved.  There is no evidence of an acute infarct.  There is no acute intracranial hemorrhage.  There is no midline shift or mass effect.  There is no extra-axial fluid collection.  The paranasal sinuses and mastoid air cells are aerated.  The orbits are intact.   IMPRESSION: Normal head CT.   Electronically Signed   By: Rosemarie Ax   On: 04/06/2014 13:39     EKG Interpretation None      MDM   Final diagnoses:  Headache   Patient with gradual onset fairly constant headache for 2 weeks, clinically most likely musculoskeletal extending posteriorly from trapezius and paraspinal cervical. With headache different than previous discuss CT head and headache cocktail. Patient need follow-up with neurology.  On reassessment patient comfortable headache improved significantly. CT unremarkable results reviewed. Discussed outpatient follow-up with neurology  Results and differential diagnosis were discussed with the patient/parent/guardian. Close follow up outpatient was discussed, comfortable with the plan.   Medications  metoCLOPramide (REGLAN) injection 10 mg (10 mg Intramuscular Given 04/06/14 1302)  diphenhydrAMINE (BENADRYL) capsule 25 mg (25 mg Oral Given 04/06/14 1302)  HYDROcodone-acetaminophen (NORCO/VICODIN) 5-325 MG per tablet 1-2 tablet (1 tablet Oral Given 04/06/14 1414)    Filed Vitals:   04/06/14 1147 04/06/14 1419  BP: 140/87 143/81  Pulse: 80 70  Temp: 98.3 F (36.8 C) 98.1 F (36.7 C)  TempSrc: Oral Oral  Resp: 18 18  SpO2: 96% 96%    Final diagnoses:  Headache        Mariea Clonts, MD 04/06/14 1513

## 2014-04-07 ENCOUNTER — Ambulatory Visit: Payer: Commercial Managed Care - HMO

## 2014-05-09 ENCOUNTER — Encounter (HOSPITAL_COMMUNITY): Payer: Self-pay | Admitting: *Deleted

## 2014-05-09 ENCOUNTER — Emergency Department (HOSPITAL_COMMUNITY)
Admission: EM | Admit: 2014-05-09 | Discharge: 2014-05-09 | Disposition: A | Discharge status: Home / Self Care | Payer: Commercial Managed Care - HMO | Attending: Emergency Medicine | Admitting: Emergency Medicine

## 2014-05-09 DIAGNOSIS — I10 Essential (primary) hypertension: Secondary | ICD-10-CM | POA: Diagnosis not present

## 2014-05-09 DIAGNOSIS — Z3202 Encounter for pregnancy test, result negative: Secondary | ICD-10-CM | POA: Diagnosis not present

## 2014-05-09 DIAGNOSIS — E669 Obesity, unspecified: Secondary | ICD-10-CM | POA: Diagnosis not present

## 2014-05-09 DIAGNOSIS — Z791 Long term (current) use of non-steroidal anti-inflammatories (NSAID): Secondary | ICD-10-CM | POA: Insufficient documentation

## 2014-05-09 DIAGNOSIS — Z880 Allergy status to penicillin: Secondary | ICD-10-CM | POA: Diagnosis not present

## 2014-05-09 DIAGNOSIS — R102 Pelvic and perineal pain: Secondary | ICD-10-CM | POA: Diagnosis present

## 2014-05-09 DIAGNOSIS — G629 Polyneuropathy, unspecified: Secondary | ICD-10-CM | POA: Insufficient documentation

## 2014-05-09 DIAGNOSIS — Z87891 Personal history of nicotine dependence: Secondary | ICD-10-CM | POA: Diagnosis not present

## 2014-05-09 DIAGNOSIS — Z79899 Other long term (current) drug therapy: Secondary | ICD-10-CM | POA: Diagnosis not present

## 2014-05-09 DIAGNOSIS — Z7951 Long term (current) use of inhaled steroids: Secondary | ICD-10-CM | POA: Diagnosis not present

## 2014-05-09 DIAGNOSIS — M5416 Radiculopathy, lumbar region: Secondary | ICD-10-CM | POA: Insufficient documentation

## 2014-05-09 DIAGNOSIS — Z8742 Personal history of other diseases of the female genital tract: Secondary | ICD-10-CM | POA: Diagnosis not present

## 2014-05-09 DIAGNOSIS — G8929 Other chronic pain: Secondary | ICD-10-CM | POA: Diagnosis not present

## 2014-05-09 DIAGNOSIS — E785 Hyperlipidemia, unspecified: Secondary | ICD-10-CM | POA: Diagnosis not present

## 2014-05-09 LAB — URINALYSIS, ROUTINE W REFLEX MICROSCOPIC
Bilirubin Urine: NEGATIVE
Glucose, UA: NEGATIVE mg/dL
Hgb urine dipstick: NEGATIVE
Ketones, ur: NEGATIVE mg/dL
Leukocytes, UA: NEGATIVE
Nitrite: NEGATIVE
Protein, ur: NEGATIVE mg/dL
Specific Gravity, Urine: 1.022 (ref 1.005–1.030)
Urobilinogen, UA: 0.2 mg/dL (ref 0.0–1.0)
pH: 5.5 (ref 5.0–8.0)

## 2014-05-09 LAB — POC URINE PREG, ED: Preg Test, Ur: NEGATIVE

## 2014-05-09 MED ORDER — PREDNISONE 20 MG PO TABS: 40.0000 mg | ORAL_TABLET | Freq: Every day | ORAL | Status: DC

## 2014-05-09 NOTE — Discharge Instructions (Signed)
Sciatica Sciatica is pain, weakness, numbness, or tingling along the path of the sciatic nerve. The nerve starts in the lower back and runs down the back of each leg. The nerve controls the muscles in the lower leg and in the back of the knee, while also providing sensation to the back of the thigh, lower leg, and the sole of your foot. Sciatica is a symptom of another medical condition. For instance, nerve damage or certain conditions, such as a herniated disk or bone spur on the spine, pinch or put pressure on the sciatic nerve. This causes the pain, weakness, or other sensations normally associated with sciatica. Generally, sciatica only affects one side of the body. CAUSES   Herniated or slipped disc.  Degenerative disk disease.  A pain disorder involving the narrow muscle in the buttocks (piriformis syndrome).  Pelvic injury or fracture.  Pregnancy.  Tumor (rare). SYMPTOMS  Symptoms can vary from mild to very severe. The symptoms usually travel from the low back to the buttocks and down the back of the leg. Symptoms can include:  Mild tingling or dull aches in the lower back, leg, or hip.  Numbness in the back of the calf or sole of the foot.  Burning sensations in the lower back, leg, or hip.  Sharp pains in the lower back, leg, or hip.  Leg weakness.  Severe back pain inhibiting movement. These symptoms may get worse with coughing, sneezing, laughing, or prolonged sitting or standing. Also, being overweight may worsen symptoms. DIAGNOSIS  Your caregiver will perform a physical exam to look for common symptoms of sciatica. He or she may ask you to do certain movements or activities that would trigger sciatic nerve pain. Other tests may be performed to find the cause of the sciatica. These may include:  Blood tests.  X-rays.  Imaging tests, such as an MRI or CT scan. TREATMENT  Treatment is directed at the cause of the sciatic pain. Sometimes, treatment is not necessary  and the pain and discomfort goes away on its own. If treatment is needed, your caregiver may suggest:  Over-the-counter medicines to relieve pain.  Prescription medicines, such as anti-inflammatory medicine, muscle relaxants, or narcotics.  Applying heat or ice to the painful area.  Steroid injections to lessen pain, irritation, and inflammation around the nerve.  Reducing activity during periods of pain.  Exercising and stretching to strengthen your abdomen and improve flexibility of your spine. Your caregiver may suggest losing weight if the extra weight makes the back pain worse.  Physical therapy.  Surgery to eliminate what is pressing or pinching the nerve, such as a bone spur or part of a herniated disk. HOME CARE INSTRUCTIONS   Only take over-the-counter or prescription medicines for pain or discomfort as directed by your caregiver.  Apply ice to the affected area for 20 minutes, 3-4 times a day for the first 48-72 hours. Then try heat in the same way.  Exercise, stretch, or perform your usual activities if these do not aggravate your pain.  Attend physical therapy sessions as directed by your caregiver.  Keep all follow-up appointments as directed by your caregiver.  Do not wear high heels or shoes that do not provide proper support.  Check your mattress to see if it is too soft. A firm mattress may lessen your pain and discomfort. SEEK IMMEDIATE MEDICAL CARE IF:   You lose control of your bowel or bladder (incontinence).  You have increasing weakness in the lower back, pelvis, buttocks,   or legs.  You have redness or swelling of your back.  You have a burning sensation when you urinate.  You have pain that gets worse when you lie down or awakens you at night.  Your pain is worse than you have experienced in the past.  Your pain is lasting longer than 4 weeks.  You are suddenly losing weight without reason. MAKE SURE YOU:  Understand these  instructions.  Will watch your condition.  Will get help right away if you are not doing well or get worse. Document Released: 04/30/2001 Document Revised: 11/05/2011 Document Reviewed: 09/15/2011 Exit[employer] Patient Information 2015 [employer]. This information is not intended to replace advice given to you by your health care provider. Make sure you discuss any questions you have with your health care provider.  

## 2014-05-09 NOTE — ED Provider Notes (Signed)
CSN: [id number]     Arrival date & time 05/09/14  1515 History   First MD Initiated Contact with Patient 05/09/14 1647     Chief Complaint  Patient presents with  . Vaginal Pain     (Consider location/radiation/quality/duration/timing/severity/associated sxs/prior Treatment) HPI Comments: Patient with hyperlipidemia, hypertension, and chronic low back pain, presents to the emergency department with chief complaints of tingling sensation when she urinates. She states it feels like UTI. She denies any pain with urination, but does report frequency and urgency. She has tried drinking cranberry juice with no relief. She denies any fevers, chills, nausea, vomiting, diarrhea, or constipation. There are no aggravating or alleviating factors. She denies any chest pain or abdominal pain.  The history is provided by the patient. No language interpreter was used.    Past Medical History  Diagnosis Date  . Hyperlipidemia   . Hypertension   . Back pain     Seen by Neurosurery  . Peripheral neuropathy   . Allergy   . Obesity   . Post-menopausal   . Leg pain     Bil   Past Surgical History  Procedure Laterality Date  . Back surgery      2009  . Tubal ligation     Family History  Problem Relation Age of Onset  . Lupus Mother   . Heart disease Mother    History  Substance Use Topics  . Smoking status: Former Research scientist (life sciences)  . Smokeless tobacco: Never Used  . Alcohol Use: No   OB History    No data available     Review of Systems  Constitutional: Negative for fever and chills.  Respiratory: Negative for shortness of breath.   Cardiovascular: Negative for chest pain.  Gastrointestinal: Negative for nausea, vomiting, diarrhea and constipation.  Genitourinary: Positive for urgency and frequency. Negative for dysuria.  All other systems reviewed and are negative.     Allergies  Penicillins and Amoxicillin  Home Medications   Prior to Admission medications   Medication Sig Start  Date End Date Taking? Authorizing Provider  albuterol (PROVENTIL HFA;VENTOLIN HFA) 108 (90 BASE) MCG/ACT inhaler Inhale 1-2 puffs into the lungs every 6 (six) hours as needed for wheezing or shortness of breath. 04/28/13   Kandis Nab, MD  amLODipine (NORVASC) 5 MG tablet Take 1 tablet (5 mg total) by mouth daily. 02/10/14   Nobie Putnam, DO  atorvastatin (LIPITOR) 40 MG tablet Take 1 tablet (40 mg total) by mouth daily. 09/29/13   Kandis Nab, MD  cetirizine (ZYRTEC) 10 MG tablet Take 1 tablet (10 mg total) by mouth daily. 01/07/13   Kandis Nab, MD  fluticasone (FLONASE) 50 MCG/ACT nasal spray Place 2 sprays into the nose daily. 01/07/13   Kandis Nab, MD  gabapentin (NEURONTIN) 100 MG capsule Take 1 capsule (100 mg total) by mouth 3 (three) times daily. Patient taking differently: Take 100 mg by mouth 2 (two) times daily.  02/10/14   Nobie Putnam, DO  hydrochlorothiazide (HYDRODIURIL) 25 MG tablet Take 1 tablet (25 mg total) by mouth daily. 11/05/13   Kandis Nab, MD  ibuprofen (ADVIL,MOTRIN) 600 MG tablet Take 600 mg by mouth every 6 (six) hours as needed for pain.    Historical Provider, MD  losartan (COZAAR) 100 MG tablet Take 1 tablet (100 mg total) by mouth daily. 09/29/13   Kandis Nab, MD  meloxicam (MOBIC) 15 MG tablet Take 1 tablet (15 mg total) by mouth daily. 02/10/14  Nobie Putnam, DO  naproxen (NAPROSYN) 500 MG tablet Take 1 tablet (500 mg total) by mouth 2 (two) times daily. 09/21/13   Norman Herrlich, NP  pantoprazole (PROTONIX) 20 MG tablet Take 1 tablet (20 mg total) by mouth daily. 03/12/13   Domenic Moras, PA-C   BP 106/61 mmHg  Pulse 81  Temp(Src) 98.9 F (37.2 C) (Oral)  Resp 20  SpO2 100% Physical Exam  Constitutional: She is oriented to person, place, and time. She appears well-developed and well-nourished.  HENT:  Head: Normocephalic and atraumatic.  Eyes: Conjunctivae and EOM are normal. Pupils are equal, round, and  reactive to light.  Neck: Normal range of motion. Neck supple.  Cardiovascular: Normal rate and regular rhythm.  Exam reveals no gallop and no friction rub.   No murmur heard. Pulmonary/Chest: Effort normal and breath sounds normal. No respiratory distress. She has no wheezes. She has no rales. She exhibits no tenderness.  Abdominal: Soft. She exhibits no distension and no mass. There is no tenderness. There is no rebound and no guarding.  No focal abdominal tenderness, no RLQ tenderness or pain at McBurney's point, no RUQ tenderness or Murphy's sign, no left-sided abdominal tenderness, no fluid wave, or signs of peritonitis   Musculoskeletal: Normal range of motion. She exhibits no edema or tenderness.  Neurological: She is alert and oriented to person, place, and time.  Skin: Skin is warm and dry.  Psychiatric: She has a normal mood and affect. Her behavior is normal. Judgment and thought content normal.  Nursing note and vitals reviewed.   ED Course  Procedures (including critical care time) Results for orders placed or performed during the hospital encounter of 05/09/14  Urinalysis, Routine w reflex microscopic  Result Value Ref Range   Color, Urine YELLOW YELLOW   APPearance CLEAR CLEAR   Specific Gravity, Urine 1.022 1.005 - 1.030   pH 5.5 5.0 - 8.0   Glucose, UA NEGATIVE NEGATIVE mg/dL   Hgb urine dipstick NEGATIVE NEGATIVE   Bilirubin Urine NEGATIVE NEGATIVE   Ketones, ur NEGATIVE NEGATIVE mg/dL   Protein, ur NEGATIVE NEGATIVE mg/dL   Urobilinogen, UA 0.2 0.0 - 1.0 mg/dL   Nitrite NEGATIVE NEGATIVE   Leukocytes, UA NEGATIVE NEGATIVE  POC urine preg, ED (not at [facility])  Result Value Ref Range   Preg Test, Ur NEGATIVE NEGATIVE   No results found.   Imaging Review No results found.   EKG Interpretation None      MDM   Final diagnoses:  Lumbar radiculopathy    Patient with tingling sensation when she urinates, no abdominal pain, no fevers, no vomiting. No vaginal  discharge. Will check UA and reassess.  Patient's symptoms sound to be more of a radiculopathy sensation, she states that she has had tingling on the fronts of her legs. She initially thought this was a UTI, but she has a history of bulged discs which seems to fit better.  Her UA is negative.  She does not have any abdominal pain or vaginal discharge.  I will treat with prednisone.  Patient with back pain.  No neurological deficits and normal neuro exam.  Patient is ambulatory.  No loss of bowel or bladder control.  Doubt cauda equina.  Denies fever,  doubt epidural abscess or other lesion. Recommend back exercises, stretching, RICE, and will treat with a short course of prednisone.  Encouraged the patient that there could be a need for additional workup and/or imaging such as MRI, if the symptoms do not  resolve. Patient advised that if the back pain does not resolve, or radiates, this could progress to more serious conditions and is encouraged to follow-up with PCP or orthopedics within 2 weeks.      Montine Circle, PA-C 05/09/14 1843  Charlesetta Shanks, MD 05/09/14 2308

## 2014-05-09 NOTE — ED Notes (Signed)
Pt reports feeling a "tingling" sensation in her vaginal area x 2 weeks.  States possible UTI.  Pt denies dysuria, hematuriaj or odorous urine at this time.

## 2014-05-23 ENCOUNTER — Encounter (HOSPITAL_COMMUNITY): Payer: Self-pay | Admitting: Emergency Medicine

## 2014-05-23 ENCOUNTER — Emergency Department (HOSPITAL_COMMUNITY)
Admission: EM | Admit: 2014-05-23 | Discharge: 2014-05-23 | Disposition: A | Discharge status: Home / Self Care | Payer: Commercial Managed Care - HMO | Attending: Emergency Medicine | Admitting: Emergency Medicine

## 2014-05-23 ENCOUNTER — Emergency Department (HOSPITAL_COMMUNITY): Payer: Commercial Managed Care - HMO

## 2014-05-23 DIAGNOSIS — G629 Polyneuropathy, unspecified: Secondary | ICD-10-CM | POA: Diagnosis not present

## 2014-05-23 DIAGNOSIS — Z780 Asymptomatic menopausal state: Secondary | ICD-10-CM | POA: Diagnosis not present

## 2014-05-23 DIAGNOSIS — G8929 Other chronic pain: Secondary | ICD-10-CM | POA: Insufficient documentation

## 2014-05-23 DIAGNOSIS — E785 Hyperlipidemia, unspecified: Secondary | ICD-10-CM | POA: Diagnosis not present

## 2014-05-23 DIAGNOSIS — R079 Chest pain, unspecified: Secondary | ICD-10-CM | POA: Diagnosis not present

## 2014-05-23 DIAGNOSIS — Z7951 Long term (current) use of inhaled steroids: Secondary | ICD-10-CM | POA: Diagnosis not present

## 2014-05-23 DIAGNOSIS — I10 Essential (primary) hypertension: Secondary | ICD-10-CM | POA: Insufficient documentation

## 2014-05-23 DIAGNOSIS — Z7952 Long term (current) use of systemic steroids: Secondary | ICD-10-CM | POA: Diagnosis not present

## 2014-05-23 DIAGNOSIS — E669 Obesity, unspecified: Secondary | ICD-10-CM | POA: Diagnosis not present

## 2014-05-23 DIAGNOSIS — R200 Anesthesia of skin: Secondary | ICD-10-CM | POA: Insufficient documentation

## 2014-05-23 DIAGNOSIS — Z791 Long term (current) use of non-steroidal anti-inflammatories (NSAID): Secondary | ICD-10-CM | POA: Diagnosis not present

## 2014-05-23 DIAGNOSIS — Z87891 Personal history of nicotine dependence: Secondary | ICD-10-CM | POA: Diagnosis not present

## 2014-05-23 DIAGNOSIS — Z880 Allergy status to penicillin: Secondary | ICD-10-CM | POA: Insufficient documentation

## 2014-05-23 LAB — I-STAT TROPONIN, ED
Troponin i, poc: 0 ng/mL (ref 0.00–0.08)
Troponin i, poc: 0 ng/mL (ref 0.00–0.08)

## 2014-05-23 LAB — BASIC METABOLIC PANEL WITH GFR
Anion gap: 2 — ABNORMAL LOW (ref 5–15)
BUN: 13 mg/dL (ref 6–23)
CO2: 30 mmol/L (ref 19–32)
Calcium: 8.9 mg/dL (ref 8.4–10.5)
Chloride: 108 meq/L (ref 96–112)
Creatinine, Ser: 0.91 mg/dL (ref 0.50–1.10)
GFR calc Af Amer: 83 mL/min — ABNORMAL LOW
GFR calc non Af Amer: 71 mL/min — ABNORMAL LOW
Glucose, Bld: 121 mg/dL — ABNORMAL HIGH (ref 70–99)
Potassium: 3.7 mmol/L (ref 3.5–5.1)
Sodium: 140 mmol/L (ref 135–145)

## 2014-05-23 LAB — CBC
HCT: 40.1 % (ref 36.0–46.0)
Hemoglobin: 12.8 g/dL (ref 12.0–15.0)
MCH: 27.4 pg (ref 26.0–34.0)
MCHC: 31.9 g/dL (ref 30.0–36.0)
MCV: 85.7 fL (ref 78.0–100.0)
Platelets: 222 10*3/uL (ref 150–400)
RBC: 4.68 MIL/uL (ref 3.87–5.11)
RDW: 15.7 % — AB (ref 11.5–15.5)
WBC: 8 10*3/uL (ref 4.0–10.5)

## 2014-05-23 NOTE — ED Notes (Signed)
Pt states she is having chest pain with radiation into her left arm and numbness in her left leg  Pt states the pain started about an hour and a half ago   Pt denies any other sxs associated with the pain  Pt describes the pain as a stabbing sensation

## 2014-05-23 NOTE — Discharge Instructions (Signed)

## 2014-05-23 NOTE — ED Notes (Signed)
Pt reports intermittent stabbing pain to L chest, onset 1830 tonight, radiates into L neck, shoulder, and down L arm. Pt denies SOB, nausea, dizziness, or any other sx. Pain started while pt was sitting down.

## 2014-05-23 NOTE — ED Provider Notes (Signed)
CSN: [id number]     Arrival date & time 05/23/14  1956 History   First MD Initiated Contact with Patient 05/23/14 2106     Chief Complaint  Patient presents with  . Chest Pain  . Numbness    Patient is a 54 y.o. female presenting with chest pain. The history is provided by the patient. No language interpreter was used.  Chest Pain  Ms. Defibaugh comes in for stabbing chest pain.  Pain is left sided chest and shoots to the left neck and left arm.  Sxs started about two hours ago when watching TV.  No fever, SOB, nausea, vomiting, diaphoresis, abdominal pain, leg swelling or pain.  No alleviating or worsening factors.  Pain is intermittent in the chest.  Pain will be there 5-10 minutes at a time.  No similar sxs previously.  Has hx/o chronic pain, HTN, HPL, no tobacco.  No hx/o heart disease.  Sxs are mild in nature and currently resolved.  No exogenous estrogen.    Past Medical History  Diagnosis Date  . Hyperlipidemia   . Hypertension   . Back pain     Seen by Neurosurery  . Peripheral neuropathy   . Allergy   . Obesity   . Post-menopausal   . Leg pain     Bil   Past Surgical History  Procedure Laterality Date  . Back surgery      2009  . Tubal ligation    . Carpel tunnel release     Family History  Problem Relation Age of Onset  . Lupus Mother   . Heart disease Mother    History  Substance Use Topics  . Smoking status: Former Research scientist (life sciences)  . Smokeless tobacco: Never Used  . Alcohol Use: No   OB History    No data available     Review of Systems  Cardiovascular: Positive for chest pain.  Neurological:       Intermittent numbness of LLE  All other systems reviewed and are negative.     Allergies  Penicillins and Amoxicillin  Home Medications   Prior to Admission medications   Medication Sig Start Date End Date Taking? Authorizing Provider  albuterol (PROVENTIL HFA;VENTOLIN HFA) 108 (90 BASE) MCG/ACT inhaler Inhale 1-2 puffs into the lungs every 6 (six) hours as  needed for wheezing or shortness of breath. 04/28/13   Kandis Nab, MD  amLODipine (NORVASC) 5 MG tablet Take 1 tablet (5 mg total) by mouth daily. 02/10/14   Nobie Putnam, DO  atorvastatin (LIPITOR) 40 MG tablet Take 1 tablet (40 mg total) by mouth daily. 09/29/13   Kandis Nab, MD  cetirizine (ZYRTEC) 10 MG tablet Take 1 tablet (10 mg total) by mouth daily. 01/07/13   Kandis Nab, MD  fluticasone (FLONASE) 50 MCG/ACT nasal spray Place 2 sprays into the nose daily. 01/07/13   Kandis Nab, MD  gabapentin (NEURONTIN) 100 MG capsule Take 1 capsule (100 mg total) by mouth 3 (three) times daily. Patient taking differently: Take 100 mg by mouth 2 (two) times daily.  02/10/14   Nobie Putnam, DO  hydrochlorothiazide (HYDRODIURIL) 25 MG tablet Take 1 tablet (25 mg total) by mouth daily. 11/05/13   Kandis Nab, MD  ibuprofen (ADVIL,MOTRIN) 600 MG tablet Take 600 mg by mouth every 6 (six) hours as needed for pain.    Historical Provider, MD  losartan (COZAAR) 100 MG tablet Take 1 tablet (100 mg total) by mouth daily. 09/29/13   Burnell Blanks  Losq, MD  meloxicam (MOBIC) 15 MG tablet Take 1 tablet (15 mg total) by mouth daily. 02/10/14   Nobie Putnam, DO  naproxen (NAPROSYN) 500 MG tablet Take 1 tablet (500 mg total) by mouth 2 (two) times daily. 09/21/13   Norman Herrlich, NP  pantoprazole (PROTONIX) 20 MG tablet Take 1 tablet (20 mg total) by mouth daily. 03/12/13   Domenic Moras, PA-C  predniSONE (DELTASONE) 20 MG tablet Take 2 tablets (40 mg total) by mouth daily. 05/09/14   Montine Circle, PA-C   BP 97/62 mmHg  Pulse 75  Temp(Src) 98 F (36.7 C) (Oral)  Resp 14  SpO2 97% Physical Exam  Constitutional: She is oriented to person, place, and time. She appears well-developed and well-nourished.  HENT:  Head: Normocephalic and atraumatic.  Cardiovascular: Normal rate and regular rhythm.   No murmur heard. Pulmonary/Chest: Effort normal and breath sounds normal.  No respiratory distress.  Abdominal: Soft. There is no tenderness. There is no rebound and no guarding.  Musculoskeletal: She exhibits no edema or tenderness.  Neurological: She is alert and oriented to person, place, and time.  Skin: Skin is warm and dry.  Psychiatric: She has a normal mood and affect. Her behavior is normal.  Nursing note and vitals reviewed.   ED Course  Procedures (including critical care time) Labs Review Labs Reviewed  CBC - Abnormal; Notable for the following:    RDW 15.7 (*)    All other components within normal limits  BASIC METABOLIC PANEL - Abnormal; Notable for the following:    Glucose, Bld 121 (*)    GFR calc non Af Amer 71 (*)    GFR calc Af Amer 83 (*)    Anion gap 2 (*)    All other components within normal limits  I-STAT TROPOININ, ED  Randolm Idol, ED    Imaging Review Dg Chest 2 View  05/23/2014   CLINICAL DATA:  Mid chest pain today.  Ex-smoker.  EXAM: CHEST  2 VIEW  COMPARISON:  01/05/2013.  FINDINGS: Normal sized heart. Clear lungs. Thoracic spine degenerative changes.  IMPRESSION: No acute abnormality.   Electronically Signed   By: Enrique Sack M.D.   On: 05/23/2014 21:37     EKG Interpretation   Date/Time:  Monday May 23 2014 20:30:48 EST Ventricular Rate:  76 PR Interval:  149 QRS Duration: 83 QT Interval:  413 QTC Calculation: 464 R Axis:   32 Text Interpretation:  Sinus rhythm Low voltage, precordial leads Confirmed  by Hazle Coca [id number]) on 05/23/2014 9:07:07 PM      MDM   Final diagnoses:  Chest pain, unspecified chest pain type    Patient here for evaluation of chest pain, pain is resolved in the emergency department. Clinical picture is not consistent with PE or ACS. No evidence of CHF or pneumonia based on history, examination, chest x-ray. Discussed with patient findings of laboratory studies and option for stress test in the hospital versus close PCP follow-up and stress testing. Patient prefers outpatient  follow-up. Patient's symptoms have been completely resolved in the emergency department. Discussed with patient taking daily aspirin, PCP follow-up for stress testing in the next few days, return precautions were discussed.    Quintella Reichert, MD 05/24/14 [phone]

## 2014-05-30 ENCOUNTER — Ambulatory Visit (INDEPENDENT_AMBULATORY_CARE_PROVIDER_SITE_OTHER): Payer: Commercial Managed Care - HMO | Admitting: Family Medicine

## 2014-05-30 ENCOUNTER — Encounter: Payer: Self-pay | Admitting: Family Medicine

## 2014-05-30 VITALS — BP 105/77 | HR 99 | Temp 98.1°F | Ht 63.0 in | Wt 216.0 lb

## 2014-05-30 DIAGNOSIS — R0789 Other chest pain: Secondary | ICD-10-CM

## 2014-05-30 DIAGNOSIS — R300 Dysuria: Secondary | ICD-10-CM

## 2014-05-30 DIAGNOSIS — I10 Essential (primary) hypertension: Secondary | ICD-10-CM

## 2014-05-30 LAB — POCT URINALYSIS DIPSTICK
Bilirubin, UA: NEGATIVE
Blood, UA: NEGATIVE
GLUCOSE UA: NEGATIVE
Ketones, UA: NEGATIVE
LEUKOCYTES UA: NEGATIVE
NITRITE UA: NEGATIVE
PROTEIN UA: NEGATIVE
Spec Grav, UA: 1.02
Urobilinogen, UA: 1
pH, UA: 7.5

## 2014-05-30 MED ORDER — PANTOPRAZOLE SODIUM 20 MG PO TBEC: 20.0000 mg | DELAYED_RELEASE_TABLET | Freq: Every day | ORAL | Status: DC

## 2014-05-30 MED ORDER — ALBUTEROL SULFATE HFA 108 (90 BASE) MCG/ACT IN AERS: 1.0000 | INHALATION_SPRAY | Freq: Four times a day (QID) | RESPIRATORY_TRACT | Status: DC | PRN

## 2014-05-30 NOTE — Progress Notes (Signed)
[facility]  Archie Patten, MD  Subjective:  Chief complaint: ED follow up, pain at the urethra  ED follow up: The patient was seen in the ED on 05/23/2014 due to a 8 out of 10 stabbing left substernal chest pain that radiated to the left neck and left shoulder that was intermittent in nature. The onset was sudden while watching TV: Denies eating at that time. At that time, an EKG was performed that was unremarkable. She had negative point-of-care troponins 2. Her pain resolved while in the ED, therefore she was given the option of hospitalization with subsequent stress test or follow-up with her PCP for an outpatient stress test. The patient states that she currently has dull aching chest pain over the left side that is a 3 out of 10, and "tolerable." She states that nothing aggravates or alleviates the pain. She endorses low energy and easy fatigability for approximately 1 month. She also endorses some dyspnea on exertion that appears chronic, but slightly worsened over the last month well.  She denies any shortness of breath currently, any diaphoresis, any numbness or tingling of the extremities, no pain in her jaw. Additionally, she does note some dizziness after standing up from a bent position.   Pain at the urethra:  The patient states that she was seen in the ED approximately 2 weeks ago due to concerns about a urinary tract infection. She currently states that she has a "burning" sensation at her urethra while she is urinating, but denies any burning or pain with wiping. She denies any urinary urgency or urinary frequency. No hematuria. She denies any vaginal discharge or pruritus. She does note an increased in sexual activity, however states that she is not high risk for sexually transmitted diseases. From reviewing the ED note from 12/21, at that time they were concerned about possible lumbar pain with radicular symptoms. At that time, the patient was prescribed prednisone  without improvement in her symptoms. She currently denies any back pain.  Past Medical History Patient Active Problem List   Diagnosis Date Noted  . Dysuria 05/30/2014  . Hamstring injury 12/22/2013  . Left shoulder pain 09/29/2013  . Bilateral edema of lower extremity 09/29/2013  . Breast pain 01/07/2013  . Chest pain, atypical 08/28/2012  . New onset of headaches 08/05/2012  . Neck pain on left side 12/26/2011  . Obstructive sleep apnea 12/26/2011  . Fatigue 09/13/2011  . Prediabetes 07/18/2011  . Back pain of lumbar region with sciatica 04/26/2010  . Morbid obesity 04/16/2010  . 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 GAIT DISTURBANCE 05/03/2009  . ALLERGIC RHINITIS 09/15/2008  . MENOPAUSAL SYNDROME 09/05/2008  . HYPERLIPIDEMIA 06/12/2007  . HYPERTENSION, BENIGN SYSTEMIC 07/17/2006    Medications- reviewed and updated Current Outpatient Prescriptions  Medication Sig Dispense Refill  . albuterol (PROVENTIL HFA;VENTOLIN HFA) 108 (90 BASE) MCG/ACT inhaler Inhale 1-2 puffs into the lungs every 6 (six) hours as needed for wheezing or shortness of breath. 1 Inhaler 3  . atorvastatin (LIPITOR) 40 MG tablet Take 1 tablet (40 mg total) by mouth daily. 90 tablet 3  . cetirizine (ZYRTEC) 10 MG tablet Take 1 tablet (10 mg total) by mouth daily. (Patient not taking: Reported on 05/23/2014) 30 tablet 11  . fluticasone (FLONASE) 50 MCG/ACT nasal spray Place 2 sprays into the nose daily. (Patient not taking: Reported on 05/23/2014) 16 g 6  . gabapentin (NEURONTIN) 100 MG capsule Take 1 capsule (100 mg total) by mouth 3 (three) times  daily. (Patient taking differently: Take 100 mg by mouth 2 (two) times daily. ) 90 capsule 3  . hydrochlorothiazide (HYDRODIURIL) 25 MG tablet Take 1 tablet (25 mg total) by mouth daily. 30 tablet 3  . losartan (COZAAR) 100 MG tablet Take 1 tablet (100 mg total) by mouth daily. 90 tablet 3  . pantoprazole (PROTONIX) 20 MG tablet Take 1 tablet (20  mg total) by mouth daily. 20 tablet 30   No current facility-administered medications for this visit.    Objective: BP 105/77 mmHg  Pulse 99  Temp(Src) 98.1 F (36.7 C) (Oral)  Ht 5\' 3"  (1.6 m)  Wt 216 lb (97.977 kg)  BMI 38.27 kg/m2 Gen: No acute distress. Alert, cooperative with exam CV: RRR. No murmurs, rubs, or gallops noted. 2+ radial pulses bilaterally. Pain not reproducible with palpation over the anterior chest wall. Resp: CTAB. No wheezing, crackles, or rhonchi noted. Abd: +BS. Soft, non-distended, non-tender. No rebound or guarding.  Ext: No edema. No gross deformities. Neuro: Alert and oriented, No gross focal deficits   Assessment/Plan:  Chest pain, atypical Chest pain has improved since her recent ED visit, but I am concerned that it has not completely resolved. No history of ACS per report, however patient does has a history of hyperlipidemia, HTN, pre-diabetes and obesity. Her 36yr ASCVD risk is 3.9% with a HEART score of 3. She is currently on Lipitor 40mg  daily. Patient has not been taking Protonix however the patient's symptoms are constant and does not worsen with food. Additionally, she has been out of albuterol since September, however her chest pain does not worsen with exertion or cold weather. MSK etiology less likely given sudden onset without preceding cause and it is not reproducible on exam. - Will refer to cardiology urgently for stress testing - Refill albuterol and Protonix - Discussed reasons to RTC or proceed to the ED: worsening/changing chest pain, worsening SOB, numbness/tingling, chest pain associated with diaphoresis, syncopal episodes.   HYPERTENSION, BENIGN SYSTEMIC Patient endorses feeling dizzy standing upright after bending up over and states she thought it was because she was "big in the top." Patient with no syncopal history. States this dizziness has occurred for "quite sometime" but does not feel it correlates with her chest pain. Her BP in  the ED was 90s/60s and her blood pressure is a little low today in clinic. - Will hold amlodipine - Continue with HCTZ and losartan - If pt's BP continues to be soft consider decreasing HCTZ to 12.5mg   - RTC in 2 weeks for follow-up on BP.  Dysuria Patient endorses dysuria for the last several weeks. Denies urinary frequency or urgency or hematuria. U/A negative for hemoglobin, LE or nitrites. Patient feels her symptom may be improving. Currently denies any back pain or numbness/tingling of her LE. No vaginal discharge/pruritus/discomfort.  - Patient currently not interested in STD testing or pelvic exam  - Continue to monitor symptoms: if they continue, strongly consider pelvic exam with wet prep and STD testing - Additionally, if she begins to have lower back pain or other neurologic symptoms, consider imaging of the lumbar/sacral spine. - Patient to follow up in 2 weeks or sooner if symptoms worsen/change.    Orders Placed This Encounter  Procedures  . Ambulatory referral to Cardiology    Referral Priority:  Urgent    Referral Type:  Consultation    Referral Reason:  Specialty Services Required    Requested Specialty:  Cardiology    Number of Visits Requested:  1  .  POCT urinalysis dipstick    Meds ordered this encounter  Medications  . albuterol (PROVENTIL HFA;VENTOLIN HFA) 108 (90 BASE) MCG/ACT inhaler    Sig: Inhale 1-2 puffs into the lungs every 6 (six) hours as needed for wheezing or shortness of breath.    Dispense:  1 Inhaler    Refill:  3  . pantoprazole (PROTONIX) 20 MG tablet    Sig: Take 1 tablet (20 mg total) by mouth daily.    Dispense:  20 tablet    Refill:  30

## 2014-05-30 NOTE — Assessment & Plan Note (Signed)
Patient endorses dysuria for the last several weeks. Denies urinary frequency or urgency or hematuria. U/A negative for hemoglobin, LE or nitrites. Patient feels her symptom may be improving. Currently denies any back pain or numbness/tingling of her LE. No vaginal discharge/pruritus/discomfort.  - Patient currently not interested in STD testing or pelvic exam  - Continue to monitor symptoms: if they continue, strongly consider pelvic exam with wet prep and STD testing - Additionally, if she begins to have lower back pain or other neurologic symptoms, consider imaging of the lumbar/sacral spine. - Patient to follow up in 2 weeks or sooner if symptoms worsen/change.

## 2014-05-30 NOTE — Assessment & Plan Note (Signed)
Chest pain has improved since her recent ED visit, but I am concerned that it has not completely resolved. No history of ACS per report, however patient does has a history of hyperlipidemia, HTN, pre-diabetes and obesity. Her 61yr ASCVD risk is 3.9% with a HEART score of 3. She is currently on Lipitor 40mg  daily. Patient has not been taking Protonix however the patient's symptoms are constant and does not worsen with food. Additionally, she has been out of albuterol since September, however her chest pain does not worsen with exertion or cold weather. MSK etiology less likely given sudden onset without preceding cause and it is not reproducible on exam. - Will refer to cardiology urgently for stress testing - Refill albuterol and Protonix - Discussed reasons to RTC or proceed to the ED: worsening/changing chest pain, worsening SOB, numbness/tingling, chest pain associated with diaphoresis, syncopal episodes.

## 2014-05-30 NOTE — Assessment & Plan Note (Signed)
Patient endorses feeling dizzy standing upright after bending up over and states she thought it was because she was "big in the top." Patient with no syncopal history. States this dizziness has occurred for "quite sometime" but does not feel it correlates with her chest pain. Her BP in the ED was 90s/60s and her blood pressure is a little low today in clinic. - Will hold amlodipine - Continue with HCTZ and losartan - If pt's BP continues to be soft consider decreasing HCTZ to 12.5mg   - RTC in 2 weeks for follow-up on BP.

## 2014-05-30 NOTE — Patient Instructions (Addendum)
Please stop taking the amlodipine, as your blood pressure was noted to be low in clinic today and low while you were in the ED. Your urine did not show any signs of infection today in clinic; if your symptoms worsen or do not resolve, please follow up with me so I may further evaluate this. I have referred you to a cardiologist so that you may have a stress test; they should contact you with an appointment. Please follow up with me in 2 weeks so we may re-check your blood pressure.

## 2014-06-13 ENCOUNTER — Encounter: Payer: Commercial Managed Care - HMO | Admitting: Family Medicine

## 2014-06-23 ENCOUNTER — Ambulatory Visit: Payer: Commercial Managed Care - HMO | Admitting: Internal Medicine

## 2014-07-19 ENCOUNTER — Encounter: Payer: Self-pay | Admitting: Internal Medicine

## 2014-07-19 ENCOUNTER — Ambulatory Visit (INDEPENDENT_AMBULATORY_CARE_PROVIDER_SITE_OTHER): Payer: Commercial Managed Care - HMO | Admitting: Family Medicine

## 2014-07-19 ENCOUNTER — Encounter: Payer: Self-pay | Admitting: Family Medicine

## 2014-07-19 ENCOUNTER — Other Ambulatory Visit (HOSPITAL_COMMUNITY)
Admission: RE | Admit: 2014-07-19 | Discharge: 2014-07-19 | Disposition: A | Discharge status: Home / Self Care | Payer: Commercial Managed Care - HMO | Source: Ambulatory Visit | Attending: Family Medicine | Admitting: Family Medicine

## 2014-07-19 VITALS — BP 131/85 | HR 75 | Temp 98.2°F | Ht 63.0 in | Wt 220.0 lb

## 2014-07-19 DIAGNOSIS — J302 Other seasonal allergic rhinitis: Secondary | ICD-10-CM | POA: Diagnosis not present

## 2014-07-19 DIAGNOSIS — Z124 Encounter for screening for malignant neoplasm of cervix: Secondary | ICD-10-CM | POA: Diagnosis not present

## 2014-07-19 DIAGNOSIS — Z01419 Encounter for gynecological examination (general) (routine) without abnormal findings: Secondary | ICD-10-CM | POA: Diagnosis not present

## 2014-07-19 DIAGNOSIS — M79604 Pain in right leg: Secondary | ICD-10-CM | POA: Diagnosis not present

## 2014-07-19 DIAGNOSIS — E785 Hyperlipidemia, unspecified: Secondary | ICD-10-CM

## 2014-07-19 DIAGNOSIS — I10 Essential (primary) hypertension: Secondary | ICD-10-CM | POA: Diagnosis not present

## 2014-07-19 DIAGNOSIS — M79605 Pain in left leg: Secondary | ICD-10-CM

## 2014-07-19 DIAGNOSIS — Z0000 Encounter for general adult medical examination without abnormal findings: Secondary | ICD-10-CM

## 2014-07-19 DIAGNOSIS — Z1151 Encounter for screening for human papillomavirus (HPV): Secondary | ICD-10-CM | POA: Diagnosis not present

## 2014-07-19 LAB — LIPID PANEL
CHOL/HDL RATIO: 5 ratio
CHOLESTEROL: 225 mg/dL — AB (ref 0–200)
HDL: 45 mg/dL — ABNORMAL LOW (ref >=46)
LDL Cholesterol: 161 mg/dL — ABNORMAL HIGH (ref 0–99)
Triglycerides: 94 mg/dL (ref <150)
VLDL: 19 mg/dL (ref 0–40)

## 2014-07-19 MED ORDER — GABAPENTIN 100 MG PO CAPS: 100.0000 mg | ORAL_CAPSULE | Freq: Two times a day (BID) | ORAL | Status: DC

## 2014-07-19 MED ORDER — LOSARTAN POTASSIUM 100 MG PO TABS: 100.0000 mg | ORAL_TABLET | Freq: Every day | ORAL | Status: DC

## 2014-07-19 MED ORDER — MONTELUKAST SODIUM 10 MG PO TABS: 10.0000 mg | ORAL_TABLET | Freq: Every day | ORAL | Status: DC

## 2014-07-19 MED ORDER — PANTOPRAZOLE SODIUM 20 MG PO TBEC: 20.0000 mg | DELAYED_RELEASE_TABLET | Freq: Every day | ORAL | Status: DC

## 2014-07-19 MED ORDER — ATORVASTATIN CALCIUM 40 MG PO TABS: 40.0000 mg | ORAL_TABLET | Freq: Every day | ORAL | Status: DC

## 2014-07-19 MED ORDER — HYDROCHLOROTHIAZIDE 25 MG PO TABS: 25.0000 mg | ORAL_TABLET | Freq: Every day | ORAL | Status: DC

## 2014-07-19 MED ORDER — FLUTICASONE PROPIONATE 50 MCG/ACT NA SUSP: 2.0000 | Freq: Every day | NASAL | Status: DC

## 2014-07-19 NOTE — Patient Instructions (Signed)
It was great to see you. I will call you if your labs are NOT NORMAL, otherwise I will mail you a letter.  Please keep up the good work with your blood pressure, it looks great! Re-start Zyrtec, Flonase, and Protonix, if your voice doesn't improve please come back in to be evaluated.

## 2014-07-20 DIAGNOSIS — Z01419 Encounter for gynecological examination (general) (routine) without abnormal findings: Secondary | ICD-10-CM | POA: Insufficient documentation

## 2014-07-20 NOTE — Progress Notes (Addendum)
Patient ID: Kristen [name], female   DOB: 24-Feb-1961, 54 y.o.   MRN: [id number] 54 y.o. year old female presents for well woman/preventative visit and annual GYN examination.  Acute Concerns: Patient has been "phlegmy" for the last 2 months or so. Has a h/o GERD and allergic rhinitis and has not been taking these medications. She endorses post-nasal drip, rhinorrhea, watery eyes and a bad taste in the back of her throat. Occasionally gets burning sensation in the middle of her chest, mostly when lying down. Denies cough. No smoking (quite prior to 2000) and not alcohol consumption.   Diet: Not a great as it should be, however she notes she does attempt to get in a few vegetables  ROS: complete ROS reviewed and negative with the exception of: aches all over, occasional headache, joint pain occasionally.  Social:  History   Social History  . Marital Status: Legally Separated    Spouse Name: N/A  . Number of Children: N/A  . Years of Education: N/A   Social History Main Topics  . Smoking status: Former Research scientist (life sciences)  . Smokeless tobacco: Never Used  . Alcohol Use: No  . Drug Use: No  . Sexual Activity: Yes   Other Topics Concern  . None   Social History Narrative    Immunization:  Influenza: declined today  Pneumococcal: N/A   Herpes Zoster: N/A   Cancer Screening:  Pap Smear:09/2010: negative for intraepithelial lesions or malignancy  Mammogram: 12/2012: Normal   Colonoscopy:  09/2012: tubular adenoma, repeat in 3 yrs  Physical Exam: Blood pressure 131/85, pulse 75, temperature 98.2 F (36.8 C), temperature source Oral, height 5\' 3"  (1.6 m), weight 220 lb (99.791 kg). GEN: Obese female sitting up in NAD HEENT: Atraumatic. Conjunctivae non-injected. EOMI, PERRL. TMs non-erythematous, non-bulging. MMM, Oropharynx clear. No LAD or thyromegaly.  CARDIAC: RRR, no m/r/g noted. 2+ distal pulses. No edema noted. RESP: No increased WOB. Lungs CTAB without wheezing, rhonchi, or  crackles ABD: +BS, soft, NT/ND GU/GYN:Exam performed in the presence of a chaperone. External genitalia within normal limits.  Vaginal mucosa pink, moist, normal rugae.  Nonfriable cervix without lesions, minimal amount of thin white discharge on exam, no bleeding noted on speculum exam.  Bimanual exam revealed normal, nongravid uterus.  No cervical motion tenderness. No adnexal masses bilaterally however exam difficult due to body habitus. EXT: No gross deformities. Normal bulk and tone. Normal DTRs bilaterally  SKIN: No rashes noted  ASSESSMENT & PLAN: 54 y.o. female presents for annual well woman/preventative exam and GYN exam. Please see problem specific assessment and plan.    * Pap smear revealed trichomonas. Attempted to call patient several times but went to voicemail with no return call. Certified letter sent stating Flagyl Rx sent into pharmacy (no alcohol with Rx), patient should not have sex until all partners treated, and she should come in for further STD testing.

## 2014-07-20 NOTE — Assessment & Plan Note (Signed)
This could be the cause of the patient's "phlegmy" feeling vs GERD.  -Refilled Flonase and Singulair (in place of Zyrtec) in addition to Protonix

## 2014-07-20 NOTE — Assessment & Plan Note (Signed)
Patient tolerating the statin. - Lipid panel today - Discussed DASH diet  - Continue Lipitor 40mg 

## 2014-07-20 NOTE — Assessment & Plan Note (Signed)
Stable and at goal. No side effects noted   -continue losartan and HCTZ - BMET from last month unremarkable.

## 2014-07-20 NOTE — Assessment & Plan Note (Signed)
Colonoscopy up to date Pap smear with HPV co-testing performed today Declined influenza vaccine  Planning to get a mammogram

## 2014-07-21 LAB — CYTOLOGY - PAP

## 2014-07-25 ENCOUNTER — Encounter: Payer: Self-pay | Admitting: Family Medicine

## 2014-07-25 MED ORDER — METRONIDAZOLE 500 MG PO TABS: 2000.0000 mg | ORAL_TABLET | Freq: Once | ORAL | Status: DC

## 2014-07-25 NOTE — Addendum Note (Signed)
Addended by: Archie Patten on: 07/25/2014 01:47 PM   Modules accepted: Orders

## 2014-09-05 ENCOUNTER — Ambulatory Visit: Payer: Commercial Managed Care - HMO | Admitting: Family Medicine

## 2014-09-06 ENCOUNTER — Encounter: Payer: Self-pay | Admitting: Family Medicine

## 2014-09-06 ENCOUNTER — Ambulatory Visit (INDEPENDENT_AMBULATORY_CARE_PROVIDER_SITE_OTHER): Payer: Commercial Managed Care - HMO | Admitting: Family Medicine

## 2014-09-06 VITALS — BP 159/110 | HR 83 | Temp 97.8°F | Ht 63.0 in | Wt 225.1 lb

## 2014-09-06 DIAGNOSIS — M25579 Pain in unspecified ankle and joints of unspecified foot: Secondary | ICD-10-CM | POA: Diagnosis not present

## 2014-09-06 DIAGNOSIS — M79676 Pain in unspecified toe(s): Secondary | ICD-10-CM | POA: Insufficient documentation

## 2014-09-06 DIAGNOSIS — M25572 Pain in left ankle and joints of left foot: Secondary | ICD-10-CM

## 2014-09-06 DIAGNOSIS — M79675 Pain in left toe(s): Secondary | ICD-10-CM

## 2014-09-06 LAB — BASIC METABOLIC PANEL
BUN: 11 mg/dL (ref 6–23)
CO2: 29 mEq/L (ref 19–32)
Calcium: 8.9 mg/dL (ref 8.4–10.5)
Chloride: 104 mEq/L (ref 96–112)
Creat: 0.99 mg/dL (ref 0.50–1.10)
GLUCOSE: 76 mg/dL (ref 70–99)
POTASSIUM: 4.1 meq/L (ref 3.5–5.3)
SODIUM: 140 meq/L (ref 135–145)

## 2014-09-06 LAB — URIC ACID: Uric Acid, Serum: 4.3 mg/dL (ref 2.4–7.0)

## 2014-09-06 MED ORDER — IBUPROFEN 600 MG PO TABS: 600.0000 mg | ORAL_TABLET | Freq: Three times a day (TID) | ORAL | Status: DC | PRN

## 2014-09-06 NOTE — Patient Instructions (Signed)
Your ankle pain is likely due to a muscle strain. I think that this resulted from the initial toe pain that you had.  Take Ibuprofen 600 mg up to 3 times per day as needed for pain, please ice the affected area 2-3 times per day for at least 15 minutes. Perform ABC's with the ankle at least twice daily to help maintain ankle range of motion.

## 2014-09-06 NOTE — Assessment & Plan Note (Signed)
Exam consistent with muscle strain of peroneal muscles. Likely related to abnormal gait from toe pain that started a few days earlier. Toe pain resolved. No xray today as no injury/trauma.  -attempt trial of NSAIDS -ice 2-3 times daily -ABC's with ankle to maintain ROM -Return precautions given

## 2014-09-06 NOTE — Progress Notes (Signed)
   Subjective:    Patient ID: Kristen [name], female    DOB: July 26, 1960, 54 y.o.   MRN: [id number]  HPI 54 y/o female presents for evaluation left ankle/1st big toe pain.  1st toe pain started one week ago, no inciting event, throbbing pain, some redness with no swelling, no discharge from the nail/joint, the joint pain has since resolved.   5 day ago she noticed left lateal malleolus/ankle, pain is constant, worse with weight bearing, pain is 8/10, she has not taken any otc pain medications, taking gabapentin with no relief of symptoms, her foot has been turning inward with walking since the pain started   Review of Systems  Constitutional: Negative for fever and fatigue.  Chronic low back pain that is stable.      Objective:   Physical Exam Vitals: reviewed Gen: pleasant female, NAD MSK: uses cane to walk, favoring left ankle with gait however otherwise unremarkable, pain with plantarflexion  Left foot - toes are unremarkable, no redness of toes, no tenderness of toes, patient does have ingrown tonails without acute infection Left ankle - no redness, no swelling, tenderness over anterolateral ankle (over the areas of the peroneal muscles), no malleolus tenderness, ROM of ankle if full except for limited eversion due to pain, strength intact to plantarflexion/plantarflexion/inversion/eversion. Pain elicited with eversion of foot.   MRI Lumbar Spine 2011 1. Endplate marrow edema and mild prevertebral stranding at L5-S1. Favor degenerative etiology over diskitis osteomyelitis, but infection should be considered - especially if the patient is diabetic and/or immunocompromised. 2. Postoperative changes at L4-L5 and L5-S1 with improved patency of the thecal sac at both levels. However, mass effect on the left S1 nerve root persists at L5-S1, and mild spinal stenosis with moderate to severe bilateral foraminal stenosis persist at L4-L5. See above details. 3. Moderate facet  degeneration in the upper lumbar spine.      Assessment & Plan:  Please see problem specific assessment and plan.

## 2014-09-06 NOTE — Assessment & Plan Note (Signed)
Patient presents with history of left 1st toe pain and redness (primarily over medial aspect). This has resolved. Exam now unremarkable except for ingrown toenails. Suspect possible paronychia that has resolved.  -check uric acid to rule out gout -as unremarkable today no further workup.

## 2014-09-07 ENCOUNTER — Encounter: Payer: Self-pay | Admitting: Family Medicine

## 2014-09-21 ENCOUNTER — Telehealth: Payer: Self-pay | Admitting: Family Medicine

## 2014-09-21 NOTE — Telephone Encounter (Signed)
Calling to check on order for bilateral knee brace as well as back brace for patient. She is re-faxing orders today / thanks [employer], ASA

## 2014-09-21 NOTE — Telephone Encounter (Signed)
Attempted to call pt but phone said she wasn't taking any calls. Deseree Kennon Holter, CMA

## 2014-09-21 NOTE — Telephone Encounter (Signed)
-----   Message from Archie Patten, MD sent at 09/19/2014 12:26 PM EDT ----- I received a request for the patient to have a Rx for bilateral knee supports, however we have never discussed her knee problems. Will you please ask her to come into clinic for an appt.  Thanks, Archie Patten, MD [facility] Family Medicine Resident  09/19/2014, 12:28 PM

## 2014-09-21 NOTE — Telephone Encounter (Signed)
Tried to call sonya back to tell her we got the request but the pt needs to come in for an appt to be evaluated. Left message for her to call us back.Kristen [name] Kennon Holter, CMA

## 2014-09-23 NOTE — Telephone Encounter (Signed)
LMOVM for callback.  Will await callback from pt. Kristen [name], Kristen [name]

## 2014-11-24 ENCOUNTER — Ambulatory Visit: Payer: Commercial Managed Care - HMO | Admitting: Family Medicine

## 2014-11-29 ENCOUNTER — Other Ambulatory Visit: Payer: Self-pay | Admitting: *Deleted

## 2014-11-29 MED ORDER — CARVEDILOL 12.5 MG PO TABS: 12.5000 mg | ORAL_TABLET | Freq: Two times a day (BID) | ORAL | Status: DC

## 2014-12-16 ENCOUNTER — Encounter: Payer: Self-pay | Admitting: Family Medicine

## 2014-12-16 ENCOUNTER — Ambulatory Visit (INDEPENDENT_AMBULATORY_CARE_PROVIDER_SITE_OTHER): Payer: Commercial Managed Care - HMO | Admitting: Family Medicine

## 2014-12-16 VITALS — BP 148/88 | HR 109 | Temp 98.3°F | Ht 63.0 in | Wt 223.6 lb

## 2014-12-16 DIAGNOSIS — M791 Myalgia, unspecified site: Secondary | ICD-10-CM

## 2014-12-16 DIAGNOSIS — R0789 Other chest pain: Secondary | ICD-10-CM | POA: Diagnosis not present

## 2014-12-16 NOTE — Patient Instructions (Signed)
Please stop taking the Lipitor. I will call you if your labs are NOT normal, otherwise I will send you a letter.  I have refilled you Ibuprofen as well, If your cramping does not improve, please come back and see [place] sooner, otherwise follow up in 1 month.   Muscle Pain Muscle pain (myalgia) may be caused by many things, including:  Overuse or muscle strain, especially if you are not in shape. This is the most common cause of muscle pain.  Injury.  Bruises.  Viruses, such as the flu.  Infectious diseases.  Fibromyalgia, which is a chronic condition that causes muscle tenderness, fatigue, and headache.  Autoimmune diseases, including lupus.  Certain drugs, including ACE inhibitors and statins. Muscle pain may be mild or severe. In most cases, the pain lasts only a short time and goes away without treatment. To diagnose the cause of your muscle pain, your health care provider will take your medical history. This means he or she will ask you when your muscle pain began and what has been happening. If you have not had muscle pain for very long, your health care provider may want to wait before doing much testing. If your muscle pain has lasted a long time, your health care provider may want to run tests right away. If your health care provider thinks your muscle pain may be caused by illness, you may need to have additional tests to rule out certain conditions.  Treatment for muscle pain depends on the cause. Home care is often enough to relieve muscle pain. Your health care provider may also prescribe anti-inflammatory medicine. HOME CARE INSTRUCTIONS Watch your condition for any changes. The following actions may help to lessen any discomfort you are feeling:  Only take over-the-counter or prescription medicines as directed by your health care provider.  Apply ice to the sore muscle:  Put ice in a plastic bag.  Place a towel between your skin and the bag.  Leave the ice on for 15-20  minutes, 3-4 times a day.  You may alternate applying hot and cold packs to the muscle as directed by your health care provider.  If overuse is causing your muscle pain, slow down your activities until the pain goes away.  Remember that it is normal to feel some muscle pain after starting a workout program. Muscles that have not been used often will be sore at first.  Do regular, gentle exercises if you are not usually active.  Warm up before exercising to lower your risk of muscle pain.  Do not continue working out if the pain is very bad. Bad pain could mean you have injured a muscle. SEEK MEDICAL CARE IF:  Your muscle pain gets worse, and medicines do not help.  You have muscle pain that lasts longer than 3 days.  You have a rash or fever along with muscle pain.  You have muscle pain after a tick bite.  You have muscle pain while working out, even though you are in good physical condition.  You have redness, soreness, or swelling along with muscle pain.  You have muscle pain after starting a new medicine or changing the dose of a medicine. SEEK IMMEDIATE MEDICAL CARE IF:  You have trouble breathing.  You have trouble swallowing.  You have muscle pain along with a stiff neck, fever, and vomiting.  You have severe muscle weakness or cannot move part of your body. MAKE SURE YOU:   Understand these instructions.  Will watch your condition.  Will get help right away if you are not doing well or get worse. Document Released: 03/28/2006 Document Revised: 05/11/2013 Document Reviewed: 03/02/2013 [facility] [employer] Patient Information 2015 [place], [place]. This information is not intended to replace advice given to you by your health care provider. Make sure you discuss any questions you have with your health care provider.

## 2014-12-16 NOTE — Progress Notes (Signed)
Patient ID: Kristen [name], female   DOB: 02-Mar-1961, 54 y.o.   MRN: [id number]    Subjective: [id number]  HPI: Patient is a 54 y.o. female with a past medical history of HTN, HLD presenting to clinic today for myalgias.  Patient noted crampy/dull pain in legs rated 5/10 approximately 3 wks ago.  She stopped taking HCTZ and losartan on her own because she said she did "trial and error" and felt these were causing cramps in her legs. She has been taking amlodipine still.  Despite not taking losartan and HCTZ, she continues to have these same leg cramps. Pain is not worsened by walking/exertion. No increased swelling. Also endorses occasional cramping sensation in her arms as well.    Additionally, she noted chest pains 2 weeks ago that was a stabbing pain in the middle of her chest that lasted approximately 10 minutes and eased off on its own. No burning sensation or reflux. If she lifts up her breasts, her chest pain goes away completely. No diaphoresis. Baseline SOB. Currently no pain. Back in 05/2014 she was referred to cardiology urgently for atypical chest pain but she didn't make the referral.   Social History: Former smoker   ROS: All other systems reviewed and are negative.  Past Medical History Patient Active Problem List   Diagnosis Date Noted  . Myalgia 12/18/2014  . Left ankle pain 09/06/2014  . Toe pain 09/06/2014  . Routine adult health maintenance 07/20/2014  . Hamstring injury 12/22/2013  . Left shoulder pain 09/29/2013  . Bilateral edema of lower extremity 09/29/2013  . Breast pain 01/07/2013  . Chest pain, atypical 08/28/2012  . New onset of headaches 08/05/2012  . Neck pain on left side 12/26/2011  . Obstructive sleep apnea 12/26/2011  . Fatigue 09/13/2011  . Prediabetes 07/18/2011  . Back pain of lumbar region with sciatica 04/26/2010  . Morbid obesity 04/16/2010  . 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 GAIT DISTURBANCE 05/03/2009    . Allergic rhinitis 09/15/2008  . MENOPAUSAL SYNDROME 09/05/2008  . Hyperlipemia 06/12/2007  . HYPERTENSION, BENIGN SYSTEMIC 07/17/2006    Medications- reviewed and updated Current Outpatient Prescriptions  Medication Sig Dispense Refill  . albuterol (PROVENTIL HFA;VENTOLIN HFA) 108 (90 BASE) MCG/ACT inhaler Inhale 1-2 puffs into the lungs every 6 (six) hours as needed for wheezing or shortness of breath. 1 Inhaler 3  . atorvastatin (LIPITOR) 40 MG tablet Take 1 tablet (40 mg total) by mouth daily. 90 tablet 3  . carvedilol (COREG) 12.5 MG tablet Take 1 tablet (12.5 mg total) by mouth 2 (two) times daily with a meal. 90 tablet 0  . cetirizine (ZYRTEC) 10 MG tablet Take 1 tablet (10 mg total) by mouth daily. (Patient not taking: Reported on 05/23/2014) 30 tablet 11  . fluticasone (FLONASE) 50 MCG/ACT nasal spray Place 2 sprays into both nostrils daily. 16 g 6  . gabapentin (NEURONTIN) 100 MG capsule Take 1 capsule (100 mg total) by mouth 2 (two) times daily. 60 capsule 3  . hydrochlorothiazide (HYDRODIURIL) 25 MG tablet Take 1 tablet (25 mg total) by mouth daily. 30 tablet 3  . ibuprofen (ADVIL,MOTRIN) 600 MG tablet Take 1 tablet (600 mg total) by mouth every 8 (eight) hours as needed. 30 tablet 1  . losartan (COZAAR) 100 MG tablet Take 1 tablet (100 mg total) by mouth daily. 90 tablet 3  . metroNIDAZOLE (FLAGYL) 500 MG tablet Take 4 tablets (2,000 mg total) by mouth once. 4 tablet 0  . montelukast (SINGULAIR) 10  MG tablet Take 1 tablet (10 mg total) by mouth at bedtime. 30 tablet 3  . pantoprazole (PROTONIX) 20 MG tablet Take 1 tablet (20 mg total) by mouth daily. 20 tablet 30   No current facility-administered medications for this visit.    Objective: Office vital signs reviewed. BP 148/88 mmHg  Pulse 109  Temp(Src) 98.3 F (36.8 C) (Oral)  Ht 5\' 3"  (1.6 m)  Wt 223 lb 9.6 oz (101.424 kg)  BMI 39.62 kg/m2   Physical Examination:  General: Awake, alert, well- nourished,  NAD Cardio: RRR, no m/r/g noted. No pitting edema Pulm: No increased WOB.  CTAB, without wheezes, rhonchi or crackles noted.  MSK: Normal bulk and tone. No pain with palpation over the calves. No muscular tension. Normal gait and station Skin: dry, intact, no rashes or lesions Neuro: Strength and sensation grossly intact, DTRs 2/4  Assessment/Plan: Myalgia Cramping x 3 weeks. No findings on exam. No size discrepancy of calves on exam, no TTP, no swelling or muscular tension. Patient is on Lipitor. BMET previously normal but could be hypokalemic as well. - Pt to stop Lipitor  - CK and BMET today  - If labs normal and no improvement, pt will follow up in 1 month or sooner.   Chest pain, atypical Chest pain more MSK that she described 2 wks ago. Pt is at risk for CAD. - Urge pt to follow up w/cardiology for stress test (missed last appt) - RTC precautions covered again.     Orders Placed This Encounter  Procedures  . CK  . Basic Metabolic Panel    No orders of the defined types were placed in this encounter.    Archie Patten PGY-2, [place]

## 2014-12-17 LAB — CK: Total CK: 1945 U/L — ABNORMAL HIGH (ref 7–177)

## 2014-12-17 LAB — BASIC METABOLIC PANEL
BUN: 11 mg/dL (ref 7–25)
CHLORIDE: 103 mmol/L (ref 98–110)
CO2: 30 mmol/L (ref 20–31)
CREATININE: 0.95 mg/dL (ref 0.50–1.05)
Calcium: 9.3 mg/dL (ref 8.6–10.4)
GLUCOSE: 92 mg/dL (ref 65–99)
Potassium: 4.4 mmol/L (ref 3.5–5.3)
Sodium: 142 mmol/L (ref 135–146)

## 2014-12-18 ENCOUNTER — Encounter: Payer: Self-pay | Admitting: Family Medicine

## 2014-12-18 DIAGNOSIS — M791 Myalgia, unspecified site: Secondary | ICD-10-CM | POA: Insufficient documentation

## 2014-12-18 NOTE — Assessment & Plan Note (Signed)
Chest pain more MSK that she described 2 wks ago. Pt is at risk for CAD. - Urge pt to follow up w/cardiology for stress test (missed last appt) - RTC precautions covered again.

## 2014-12-18 NOTE — Assessment & Plan Note (Signed)
Cramping x 3 weeks. No findings on exam. No size discrepancy of calves on exam, no TTP, no swelling or muscular tension. Patient is on Lipitor. BMET previously normal but could be hypokalemic as well. - Pt to stop Lipitor  - CK and BMET today  - If labs normal and no improvement, pt will follow up in 1 month or sooner.

## 2014-12-19 ENCOUNTER — Telehealth: Payer: Self-pay | Admitting: Family Medicine

## 2014-12-19 NOTE — Telephone Encounter (Signed)
Attempted to call patient again with no answer.  Archie Patten, MD [facility] Family Medicine Resident  12/19/2014, 5:08 PM

## 2014-12-19 NOTE — Telephone Encounter (Signed)
Attempted to call patient to inform her of resutls. No answer.  If she calls back, her CK (enzyme that is released by the muscles) is significantly elevated: this in addition to her muscle pain is most likely due to the Lipitor that I advised her to stop taking on Friday. She should continue to not take the Lipitor and should follow up in clinic this week.   I will attempt to call her back again as well.  Archie Patten, MD [facility] Family Medicine Resident  12/19/2014, 8:14 AM

## 2014-12-20 NOTE — Telephone Encounter (Signed)
Attempted to call patient again. No answer. Please attempt to contact patient and ask her to make a follow up appointment with me or someone else in the clinic this week.  Thanks, Archie Patten, MD [facility] Family Medicine Resident  12/20/2014, 9:29 AM

## 2014-12-20 NOTE — Telephone Encounter (Signed)
Left message on voicemail for patient to call back. 

## 2014-12-20 NOTE — Telephone Encounter (Signed)
Patient returned call. Informed of below and appt made with Dr. Parks Ranger tomorrow. Kristen [name], ASA

## 2014-12-21 ENCOUNTER — Ambulatory Visit: Payer: Commercial Managed Care - HMO | Admitting: Family Medicine

## 2014-12-22 ENCOUNTER — Encounter: Payer: Self-pay | Admitting: Family Medicine

## 2014-12-22 ENCOUNTER — Ambulatory Visit (INDEPENDENT_AMBULATORY_CARE_PROVIDER_SITE_OTHER): Payer: Commercial Managed Care - HMO | Admitting: Family Medicine

## 2014-12-22 ENCOUNTER — Telehealth: Payer: Self-pay | Admitting: Family Medicine

## 2014-12-22 VITALS — BP 132/79 | HR 75 | Temp 98.0°F | Wt 221.0 lb

## 2014-12-22 DIAGNOSIS — G720 Drug-induced myopathy: Secondary | ICD-10-CM

## 2014-12-22 DIAGNOSIS — N644 Mastodynia: Secondary | ICD-10-CM

## 2014-12-22 DIAGNOSIS — T466X5A Adverse effect of antihyperlipidemic and antiarteriosclerotic drugs, initial encounter: Principal | ICD-10-CM

## 2014-12-22 LAB — COMPREHENSIVE METABOLIC PANEL
ALBUMIN: 3.5 g/dL — AB (ref 3.6–5.1)
ALK PHOS: 148 U/L — AB (ref 33–130)
ALT: 340 U/L — AB (ref 6–29)
AST: 272 U/L — ABNORMAL HIGH (ref 10–35)
BILIRUBIN TOTAL: 0.6 mg/dL (ref 0.2–1.2)
BUN: 16 mg/dL (ref 7–25)
CO2: 29 mmol/L (ref 20–31)
Calcium: 9.3 mg/dL (ref 8.6–10.4)
Chloride: 102 mmol/L (ref 98–110)
Creat: 0.94 mg/dL (ref 0.50–1.05)
Glucose, Bld: 78 mg/dL (ref 65–99)
Potassium: 4.6 mmol/L (ref 3.5–5.3)
Sodium: 139 mmol/L (ref 135–146)
Total Protein: 7.2 g/dL (ref 6.1–8.1)

## 2014-12-22 LAB — CK: Total CK: 313 U/L — ABNORMAL HIGH (ref 7–177)

## 2014-12-22 NOTE — Patient Instructions (Signed)
Drink plenty of fluids Rechecking labs today: muscle enzyme, liver, kidneys, electrolytes Stay off lipitor.  Follow up with Dr. Lorenso Courier in 2 weeks.  Be well, Dr. Ardelia Mems

## 2014-12-23 DIAGNOSIS — T466X5A Adverse effect of antihyperlipidemic and antiarteriosclerotic drugs, initial encounter: Principal | ICD-10-CM | POA: Insufficient documentation

## 2014-12-23 DIAGNOSIS — G720 Drug-induced myopathy: Secondary | ICD-10-CM | POA: Insufficient documentation

## 2014-12-23 NOTE — Assessment & Plan Note (Signed)
Symptomatically improving since stopping statin. Check CMET & repeat CK today Encouraged PO hydration F/u with PCP

## 2014-12-23 NOTE — Progress Notes (Signed)
Patient ID: Kristen [name], female   DOB: 18-Nov-1960, 54 y.o.   MRN: [id number]  HPI:  Pt presents for f/u of elevated CK. Was seen by PCP on 7/29 with myalgias and labwork done, which showed CK of 1945. Was instructed to stop statin. Patient reports that since that time, she's been feeling a little better. Still aches in her arms and legs but no weakness.   She does still endorse occasional sharp epigastric stabbing type pain. Improves when lifting breasts up. Wears 48 DD bra. Pain located in skin between her breasts. Previously referred to cardiology but has not seen them. No associated SOB or diaphoresis.   ROS: See HPI.  [place]: hx allergic rhinitis, back pain, prediabetes, morbid obesity, OSA, HTN, HLD  PHYSICAL EXAM: BP 132/79 mmHg  Pulse 75  Temp(Src) 98 F (36.7 C) (Oral)  Wt 221 lb (100.245 kg) Gen: NAD, pleasant, cooperative HEENT: NCAT Breasts: bilateral breasts normal in appearance. No erythema, deformity, or nipple discharge. No palpable abnormal masses. No axillary lymphadenopathy.  Heart: RRR no murmur Lungs: CTAB NWOB Neuro: grossly nonfocal speech normal Ext: full strength bilateral upper & lower extremities  ASSESSMENT/PLAN:  Breast pain Pain seems non anginal, improved when she lifts her breasts up. Normal breast exam today. Does have very large breasts which are contributing to pain. Continue to monitor. Did encourage her to f/u with cardiology.  Statin myopathy Symptomatically improving since stopping statin. Check CMET & repeat CK today Encouraged PO hydration F/u with PCP   FOLLOW UP: F/u in 2 weeks with PCP for CK elevation  [place] J. Ardelia Mems, [place]

## 2014-12-23 NOTE — Assessment & Plan Note (Signed)
Pain seems non anginal, improved when she lifts her breasts up. Normal breast exam today. Does have very large breasts which are contributing to pain. Continue to monitor. Did encourage her to f/u with cardiology.

## 2014-12-27 ENCOUNTER — Telehealth: Payer: Self-pay | Admitting: Family Medicine

## 2014-12-27 DIAGNOSIS — R7401 Elevation of levels of liver transaminase levels: Secondary | ICD-10-CM

## 2014-12-27 DIAGNOSIS — R740 Nonspecific elevation of levels of transaminase and lactic acid dehydrogenase [LDH]: Principal | ICD-10-CM

## 2014-12-27 NOTE — Telephone Encounter (Signed)
Called pt to discuss elevated LFT's. She did not answer the call. Left voicemail asking for her to call us back.  When she returns the call, please page me at [phone] so I can speak with her.  Thanks, Leeanne Rio, MD

## 2014-12-29 NOTE — Telephone Encounter (Signed)
Pt returned call and I was able to speak with her. She reports feeling well without complaints. Cramping continues to improve. Eating & drinking well. No abdominal pain. Denies alcohol use or tylenol use.  Advised LFT elevation may be secondary to statin use. Recommend repeating CMET before deciding on additional workup. If LFT's better with holding statin, might not need further workup.  Lab appointment scheduled for tomorrow morning. FYI to PCP Dr. Lorenso Courier.  Leeanne Rio, MD

## 2014-12-29 NOTE — Telephone Encounter (Signed)
Entered in error

## 2014-12-30 ENCOUNTER — Other Ambulatory Visit: Payer: Commercial Managed Care - HMO

## 2014-12-30 DIAGNOSIS — R740 Nonspecific elevation of levels of transaminase and lactic acid dehydrogenase [LDH]: Principal | ICD-10-CM

## 2014-12-30 DIAGNOSIS — R7401 Elevation of levels of liver transaminase levels: Secondary | ICD-10-CM

## 2014-12-30 LAB — COMPREHENSIVE METABOLIC PANEL
ALT: 190 U/L — ABNORMAL HIGH (ref 6–29)
AST: 112 U/L — ABNORMAL HIGH (ref 10–35)
Albumin: 3.6 g/dL (ref 3.6–5.1)
Alkaline Phosphatase: 130 U/L (ref 33–130)
BILIRUBIN TOTAL: 0.8 mg/dL (ref 0.2–1.2)
BUN: 15 mg/dL (ref 7–25)
CHLORIDE: 100 mmol/L (ref 98–110)
CO2: 29 mmol/L (ref 20–31)
CREATININE: 1.03 mg/dL (ref 0.50–1.05)
Calcium: 9.6 mg/dL (ref 8.6–10.4)
GLUCOSE: 65 mg/dL (ref 65–99)
Potassium: 4.1 mmol/L (ref 3.5–5.3)
SODIUM: 138 mmol/L (ref 135–146)
TOTAL PROTEIN: 7.2 g/dL (ref 6.1–8.1)

## 2014-12-30 NOTE — Progress Notes (Signed)
cmp done today Kristen [name]

## 2015-01-04 ENCOUNTER — Telehealth: Payer: Self-pay | Admitting: Family Medicine

## 2015-01-04 NOTE — Telephone Encounter (Signed)
Called pt to inform her of improving LFT's. Still are elevated, recommend recheck when she follows up with PCP later this month. Patient appreciative of the call.  Leeanne Rio, MD

## 2015-01-16 ENCOUNTER — Encounter: Payer: Self-pay | Admitting: Family Medicine

## 2015-01-16 ENCOUNTER — Ambulatory Visit (INDEPENDENT_AMBULATORY_CARE_PROVIDER_SITE_OTHER): Payer: Commercial Managed Care - HMO | Admitting: Family Medicine

## 2015-01-16 VITALS — BP 106/74 | HR 79 | Temp 98.3°F | Ht 63.0 in | Wt 220.1 lb

## 2015-01-16 DIAGNOSIS — G720 Drug-induced myopathy: Secondary | ICD-10-CM | POA: Diagnosis not present

## 2015-01-16 DIAGNOSIS — I10 Essential (primary) hypertension: Secondary | ICD-10-CM | POA: Diagnosis not present

## 2015-01-16 DIAGNOSIS — T466X5A Adverse effect of antihyperlipidemic and antiarteriosclerotic drugs, initial encounter: Principal | ICD-10-CM

## 2015-01-16 LAB — COMPREHENSIVE METABOLIC PANEL
ALK PHOS: 100 U/L (ref 33–130)
ALT: 39 U/L — AB (ref 6–29)
AST: 31 U/L (ref 10–35)
Albumin: 3.8 g/dL (ref 3.6–5.1)
BUN: 17 mg/dL (ref 7–25)
CO2: 32 mmol/L — ABNORMAL HIGH (ref 20–31)
Calcium: 9.2 mg/dL (ref 8.6–10.4)
Chloride: 102 mmol/L (ref 98–110)
Creat: 1.32 mg/dL — ABNORMAL HIGH (ref 0.50–1.05)
Glucose, Bld: 81 mg/dL (ref 65–99)
Potassium: 4 mmol/L (ref 3.5–5.3)
SODIUM: 140 mmol/L (ref 135–146)
TOTAL PROTEIN: 7 g/dL (ref 6.1–8.1)
Total Bilirubin: 0.4 mg/dL (ref 0.2–1.2)

## 2015-01-16 LAB — CK: CK TOTAL: 336 U/L — AB (ref 7–177)

## 2015-01-16 MED ORDER — LOSARTAN POTASSIUM 50 MG PO TABS: 50.0000 mg | ORAL_TABLET | Freq: Every day | ORAL | Status: DC

## 2015-01-16 NOTE — Assessment & Plan Note (Signed)
Blood pressures are stable, almost hypotensive. I am concerned that the patient's losartan could be contributing to her muscle cramps at night (as this was restarted after we discontinued Lipitor and the patient's cramps were initially improving).  - STOP amlodipine. - stressed she should be taking Coreg and HCTZ - will decrease losartan to see if this helps with nighttime muscle cramps  - patient to follow up in 4 weeks or sooner if muscle cramps worsen/do not improve.

## 2015-01-16 NOTE — Assessment & Plan Note (Signed)
CK has been significantly improving since discontinuation of Lipitor, however symptoms without improvement in the last 2-3 weeks (mostly at bedtime in the LE bilaterally). No evidence of muscle spasm/tension on exam. - Repeat CMET and CK today. - Encouraged more water intake with a goal of 8 glasses per day - Given no improvement and apparently worsening of muscle cramps over the last 2-3 weeks, have decreased losartan as well today.  - Discussed the importance of stretches prior to bedtime.  - Patient to follow up in 4 weeks or sooner if no improvement or worsening in nighttime muscle cramps

## 2015-01-16 NOTE — Progress Notes (Signed)
Subjective:     Patient ID: Kristen [name], female   DOB: [birth year], 54 y.o.   MRN: [id number]  HPI 54 yo female with medical history significant for  HTN, HLD, OSA, and allergic rhinitis presents fro f/u of statin-induced myopathy with elevated CK 1 month ago. Patient reports persistent muscle cramps in arms and legs, which are no better than last visit. She describes them as constant muscle ache and intermittent shooting 10/10 crampy pain that go from thighs down into feet. She states cramps are worse at night and wake her from sleeping. She describes them as "knots" in her calves and her "toes curling." She reports nothing makes symptoms better or worse. She does stretch, walk, and massage her legs when this occurs as she feels it should make her feel better. Patient is only drinking approximately 2 glasses of water and then other sugary drinks.  She also c/o occasionly lightheadedness if she bends over and stands up too quickly.  No other compliants.   Of note, her currently HTN regimen is not consistent with what we have in our system. She is taking amlodipine (which was discontinued approximately 2 months ago), HCTZ, and losartan. She is not taking Coreg.   Review of Systems  Constitutional: Negative for activity change.  HENT: Negative for congestion.   Cardiovascular: Negative for chest pain and leg swelling.  Musculoskeletal: Positive for myalgias. Negative for joint swelling.     Objective:   Physical Exam  Constitutional: She is oriented to person, place, and time. She appears well-developed and well-nourished. No distress.  Cardiovascular: Normal rate, regular rhythm and intact distal pulses.  Exam reveals no friction rub.   No murmur heard. Pulmonary/Chest: Effort normal and breath sounds normal. No respiratory distress. She has no wheezes. She has no rales.  Musculoskeletal: She exhibits no edema or tenderness.  Neurological: She is alert and oriented to person, place, and  time.       Assessment:   1. Persistent Muscle cramps 2/2 statin med vs. Dehydration  2. Dehydration likely due to high consumption of sugary drinks, low water intake. Plan:   1. Persistent Muscle cramps - Stressed patient should have stopped amlodipine , restart Coreg 12.5 for HTN. - BMET and CK labs - decreasing losartan as this could be contributing to myalgias, please see problem list A&P.  2. Dehydration - Decrease sugary drinks, increase water intake.  Prepared by Kristen [name], MS4   Upper Level Addendum:  I have seen and evaluated this patient along with [facility] and reviewed the above note, making necessary revisions in purple. My physical exam is below. Please refer to the Problem List based assessment and plan.   Filed Vitals:   01/16/15 0847  Height: 5\' 3"  (1.6 m)  Weight: 220 lb 1 oz (99.82 kg)  Gen: sitting up in NAD  CV: RRR, no m/r/g noted. No pitting edema. 2+ DP pulses bilaterally Lungs: CTAB without wheezing, rhonchi, or crackles. MSK: Lower extremities with normal bulk and tone. No size discrepancy. No erythema, warmth or swelling. No TTP. No muscular tension noted.  Kristen Patten, MD [place] Resident, PGY-2

## 2015-01-16 NOTE — Patient Instructions (Addendum)
STOP taking amlodipine. Take Coreg (carvediol), hydrochlorothiazide, and losartan. I am decreasing your losartan to 50mg  daily (hopefully this will help with the night cramps). Drink plenty of fluids and stretch before bedtime.  Rechecking labs today: muscle enzyme, liver, kidneys, electrolytes Stay off Lipitor!   Follow up with me in 1 month.

## 2015-02-20 ENCOUNTER — Other Ambulatory Visit: Payer: Self-pay

## 2015-02-20 DIAGNOSIS — R5381 Other malaise: Secondary | ICD-10-CM

## 2015-02-21 ENCOUNTER — Ambulatory Visit (INDEPENDENT_AMBULATORY_CARE_PROVIDER_SITE_OTHER): Payer: Commercial Managed Care - HMO | Admitting: Family Medicine

## 2015-02-21 ENCOUNTER — Encounter: Payer: Self-pay | Admitting: Family Medicine

## 2015-02-21 VITALS — BP 148/98 | HR 78 | Temp 98.6°F | Ht 63.0 in | Wt 228.6 lb

## 2015-02-21 DIAGNOSIS — R519 Headache, unspecified: Secondary | ICD-10-CM

## 2015-02-21 DIAGNOSIS — H538 Other visual disturbances: Secondary | ICD-10-CM | POA: Diagnosis not present

## 2015-02-21 DIAGNOSIS — Z23 Encounter for immunization: Secondary | ICD-10-CM | POA: Diagnosis not present

## 2015-02-21 DIAGNOSIS — T656X1A Toxic effect of paints and dyes, not elsewhere classified, accidental (unintentional), initial encounter: Secondary | ICD-10-CM | POA: Diagnosis not present

## 2015-02-21 DIAGNOSIS — I10 Essential (primary) hypertension: Secondary | ICD-10-CM

## 2015-02-21 DIAGNOSIS — R42 Dizziness and giddiness: Secondary | ICD-10-CM

## 2015-02-21 DIAGNOSIS — G720 Drug-induced myopathy: Secondary | ICD-10-CM

## 2015-02-21 DIAGNOSIS — R51 Headache: Secondary | ICD-10-CM | POA: Diagnosis not present

## 2015-02-21 DIAGNOSIS — M609 Myositis, unspecified: Secondary | ICD-10-CM | POA: Diagnosis not present

## 2015-02-21 DIAGNOSIS — T466X5A Adverse effect of antihyperlipidemic and antiarteriosclerotic drugs, initial encounter: Principal | ICD-10-CM

## 2015-02-21 LAB — COMPREHENSIVE METABOLIC PANEL
ALBUMIN: 3.5 g/dL — AB (ref 3.6–5.1)
ALT: 16 U/L (ref 6–29)
AST: 21 U/L (ref 10–35)
Alkaline Phosphatase: 89 U/L (ref 33–130)
BILIRUBIN TOTAL: 0.4 mg/dL (ref 0.2–1.2)
BUN: 11 mg/dL (ref 7–25)
CO2: 30 mmol/L (ref 20–31)
Calcium: 9.2 mg/dL (ref 8.6–10.4)
Chloride: 105 mmol/L (ref 98–110)
Creat: 1.02 mg/dL (ref 0.50–1.05)
GLUCOSE: 84 mg/dL (ref 65–99)
Potassium: 4.4 mmol/L (ref 3.5–5.3)
Sodium: 141 mmol/L (ref 135–146)
Total Protein: 6.7 g/dL (ref 6.1–8.1)

## 2015-02-21 LAB — CK: Total CK: 239 U/L — ABNORMAL HIGH (ref 7–177)

## 2015-02-21 MED ORDER — CETIRIZINE HCL 10 MG PO TABS: 10.0000 mg | ORAL_TABLET | Freq: Every day | ORAL | Status: DC

## 2015-02-21 MED ORDER — FLUTICASONE PROPIONATE 50 MCG/ACT NA SUSP: 2.0000 | Freq: Every day | NASAL | Status: DC

## 2015-02-21 MED ORDER — LOSARTAN POTASSIUM 50 MG PO TABS: 50.0000 mg | ORAL_TABLET | Freq: Every day | ORAL | Status: DC

## 2015-02-21 NOTE — Assessment & Plan Note (Signed)
BP elevated however not currently taking Coreg as prescribed or HCTZ - continue coreg BID, HCTZ, and losartan 50mg  QD - BMET today as SCr was slightly elevated at last visit (however we had already discussed decreasing losartan at that time).  - follow up in 1 month.

## 2015-02-21 NOTE — Assessment & Plan Note (Signed)
Possible etiologies include sinus pressure (given it is mostly facial in nature and nasal congestion) vs vision issues given her baseline blurred vision. Neuro exam was normal. This does not sound like migraines or cluster headaches. - Zyrtec daily for now  - Referral to ophthalmology as this could be from straining to accommodate for decreased visual acuity.  - RTC precautions discussed: worsening headache, change in character, worsening vision, neurologic deficits.

## 2015-02-21 NOTE — Assessment & Plan Note (Signed)
Does not seem like panic attacks given the setting. Most likely vasovagal secondary to dehydration. Less likely it could be a hypoglycemic spell as well. - Stressed the importance of staying well hydrated - Pt to keep track if it happens again: setting, symptoms, duration

## 2015-02-21 NOTE — Assessment & Plan Note (Signed)
Statin continued to be elevated in the 300s at last check, this could be her baseline.  Continues to endorse some mild myalgias. -CMET and CK today - continue to hold Lipitor - If CK continues to be elevated may need to rethink starting another statin in the future, will discuss with pharmacy if this occurs.

## 2015-02-21 NOTE — Patient Instructions (Addendum)
Continue to take losartan 50mg  daily. Take Coreg twice daily and hydrochlorothiazide daily  I have prescribed you an allergy tablet (Zyrtec) and flonase for nasal congestion. I have also referred you to an eye doctor. Drink lots of water! Follow up if you headache becomes worse, changes, you notice unilateral weakness, change in sensation, difficulty speaking, or change in vision.  General Headache A headache is pain or discomfort felt around the head or neck area. The specific cause of a headache may not be found. There are many causes and types of headaches. A few common ones are:  Tension headaches.  Migraine headaches.  Cluster headaches.  Chronic daily headaches. HOME CARE INSTRUCTIONS   Keep all follow-up appointments with your caregiver or any specialist referral.  Only take over-the-counter or prescription medicines for pain or discomfort as directed by your caregiver.  Lie down in a dark, quiet room when you have a headache.  Keep a headache journal to find out what may trigger your migraine headaches. For example, write down:  What you eat and drink.  How much sleep you get.  Any change to your diet or medicines.  Try massage or other relaxation techniques.  Put ice packs or heat on the head and neck. Use these 3 to 4 times per day for 15 to 20 minutes each time, or as needed.  Limit stress.  Sit up straight, and do not tense your muscles.  Quit smoking if you smoke.  Limit alcohol use.  Decrease the amount of caffeine you drink, or stop drinking caffeine.  Eat and sleep on a regular schedule.  Get 7 to 9 hours of sleep, or as recommended by your caregiver.  Keep lights dim if bright lights bother you and make your headaches worse. SEEK MEDICAL CARE IF:   You have problems with the medicines you were prescribed.  Your medicines are not working.  You have a change from the usual headache.  You have nausea or vomiting. SEEK IMMEDIATE MEDICAL CARE  IF:   Your headache becomes severe.  You have a fever.  You have a stiff neck.  You have loss of vision.  You have muscular weakness or loss of muscle control.  You start losing your balance or have trouble walking.  You feel faint or pass out.  You have severe symptoms that are different from your first symptoms. MAKE SURE YOU:   Understand these instructions.  Will watch your condition.  Will get help right away if you are not doing well or get worse. Document Released: 05/06/2005 Document Revised: 07/29/2011 Document Reviewed: 05/22/2011 [facility] Patient Information 2015 [place], [place]. This information is not intended to replace advice given to you by your health care provider. Make sure you discuss any questions you have with your health care provider.

## 2015-02-21 NOTE — Progress Notes (Signed)
Patient ID: Kristen [name], female   DOB: 1961/01/05, 54 y.o.   MRN: [id number]    Subjective: CC: f/u statin induced myalgias, new headaches  HPI: Patient is a 54 y.o. female with a past medical history of HTN, HLD, OSA presenting to clinic today for a f/u myalgias with concerns of headaches.  HEADACHE  Headache started 14 days ago Pain is like basketball dribbling in her head Severity:  10/10 Location: around the eyes, and in the neck  Medications tried: Tylenol 250mg  twice daily.  Head trauma: no  Sudden onset: no  Previous similar headaches: No  Taking blood thinners: no  History of cancer: no   Symptoms Nose congestion stuffiness:  Stuffiness same duration as headaches No watery eyes Nausea vomiting: no  Photophobia: yes  Noise sensitivity: no Double vision or loss of vision: stable blurry vision (~3 weeks). Last saw an eye doctor 9 years ago Fever: no no Neck Stiffness: no Trouble walking or speaking: no Moving makes it worse.   Patient does not know what the cause of headache might be.    Hypertension Blood pressure at home: doesn't check Blood pressure today: 148/98, repeat similar  Meds: Compliant with losartan at reduced dose of 50mg  which occurred at last visit. Only taking Coreg QD instead of BID. Not taking HCTZ as prescribed  Side effects: None ROS: Denies headache, dizziness, visual changes, nausea, vomiting, chest pain, abdominal pain or shortness of breath.   "Panic attacks": has had 2 at home while alone, not doing anything or thinking of anything, came on suddenly.  She became diaphoretic, shakey, felt the room was closing in on her for approximately 10 minutes, she then got cold all of a sudden and then everything went back to normal. Doesn't recall heart racing. Denies feeling pre-syncopal. She got "woosey when she did stand up." She felt like the room was spinning. Admits to not driving enough water. Denies a h/o anxiety or attacks like this in  the past.   Myalgias: Mild, continue to be present but stable. She has learned to "live with them." States they're everywhere but cannot tell me when she notices them the most.   Social History: former smoker   Health Maintenance: patient due to mammogram (12/2014)   ROS: All other systems reviewed and are negative.  Past Medical History Patient Active Problem List   Diagnosis Date Noted  . Dizziness 02/21/2015  . 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 Myalgia 12/18/2014  . Left ankle pain 09/06/2014  . Toe pain 09/06/2014  . Routine adult health maintenance 07/20/2014  . Hamstring injury 12/22/2013  . Left shoulder pain 09/29/2013  . Bilateral edema of lower extremity 09/29/2013  . Breast pain 01/07/2013  . Chest pain, atypical 08/28/2012  . New onset of headaches 08/05/2012  . Neck pain on left side 12/26/2011  . Obstructive sleep apnea 12/26/2011  . Fatigue 09/13/2011  . Prediabetes 07/18/2011  . Back pain of lumbar region with sciatica 04/26/2010  . Morbid obesity ([place]) 04/16/2010  . 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 GAIT DISTURBANCE 05/03/2009  . Allergic rhinitis 09/15/2008  . MENOPAUSAL SYNDROME 09/05/2008  . Hyperlipemia 06/12/2007  . HYPERTENSION, BENIGN SYSTEMIC 07/17/2006    Medications- reviewed and updated Current Outpatient Prescriptions  Medication Sig Dispense Refill  . albuterol (PROVENTIL HFA;VENTOLIN HFA) 108 (90 BASE) MCG/ACT inhaler Inhale 1-2 puffs into the lungs every 6 (six) hours as needed for wheezing or shortness of breath. 1 Inhaler 3  . carvedilol (COREG) 12.5 MG  tablet Take 1 tablet (12.5 mg total) by mouth 2 (two) times daily with a meal. 90 tablet 0  . cetirizine (ZYRTEC) 10 MG tablet Take 1 tablet (10 mg total) by mouth daily. 30 tablet 11  . fluticasone (FLONASE) 50 MCG/ACT nasal spray Place 2 sprays into both nostrils daily. 16 g 6  . gabapentin (NEURONTIN) 100 MG capsule Take 1 capsule (100 mg total) by mouth 2 (two)  times daily. 60 capsule 3  . hydrochlorothiazide (HYDRODIURIL) 25 MG tablet Take 1 tablet (25 mg total) by mouth daily. 30 tablet 3  . ibuprofen (ADVIL,MOTRIN) 600 MG tablet Take 1 tablet (600 mg total) by mouth every 8 (eight) hours as needed. 30 tablet 1  . losartan (COZAAR) 50 MG tablet Take 1 tablet (50 mg total) by mouth daily. 30 tablet 1  . metroNIDAZOLE (FLAGYL) 500 MG tablet Take 4 tablets (2,000 mg total) by mouth once. 4 tablet 0  . montelukast (SINGULAIR) 10 MG tablet Take 1 tablet (10 mg total) by mouth at bedtime. 30 tablet 3  . pantoprazole (PROTONIX) 20 MG tablet Take 1 tablet (20 mg total) by mouth daily. 20 tablet 30   No current facility-administered medications for this visit.    Objective: Office vital signs reviewed. BP 148/98 mmHg  Pulse 78  Temp(Src) 98.6 F (37 C) (Oral)  Ht 5\' 3"  (1.6 m)  Wt 228 lb 9.6 oz (103.692 kg)  BMI 40.50 kg/m2   Physical Examination:  General: Awake, alert, well- nourished, NAD ENMT:  TMs intact, normal light reflex, no erythema, no bulging, small amount of fluid noted behind the TMs bilaterally.  Nasal turbinates moist. MMM, Oropharynx clear without erythema or tonsillar exudate/hypertrophy Eyes: Conjunctiva non-injected. PERRL.  Cardio: RRR, no m/r/g noted. Trace pitting edema  Pulm: No increased WOB.  CTAB, without wheezes, rhonchi or crackles noted.  MSK: Normal gait and station. No TTP.  Neuro: A&O x4. Speech clear. EOMI, Uvula and tongue midline. Facial movements symmetric. 5/5 strength in the upper extremities and lower extremities bilaterally. Sensation intact bilaterally. Normal DTRs.    Assessment/Plan: HYPERTENSION, BENIGN SYSTEMIC BP elevated however not currently taking Coreg as prescribed or HCTZ - continue coreg BID, HCTZ, and losartan 50mg  QD - BMET today as SCr was slightly elevated at last visit (however we had already discussed decreasing losartan at that time).  - follow up in 1 month.    Statin  myopathy Statin continued to be elevated in the 300s at last check, this could be her baseline.  Continues to endorse some mild myalgias. -CMET and CK today - continue to hold Lipitor - If CK continues to be elevated may need to rethink starting another statin in the future, will discuss with pharmacy if this occurs.  New onset of headaches Possible etiologies include sinus pressure (given it is mostly facial in nature and nasal congestion) vs vision issues given her baseline blurred vision. Neuro exam was normal. This does not sound like migraines or cluster headaches. - Zyrtec daily for now  - Referral to ophthalmology as this could be from straining to accommodate for decreased visual acuity.  - RTC precautions discussed: worsening headache, change in character, worsening vision, neurologic deficits.   Dizziness Does not seem like panic attacks given the setting. Most likely vasovagal secondary to dehydration. Less likely it could be a hypoglycemic spell as well. - Stressed the importance of staying well hydrated - Pt to keep track if it happens again: setting, symptoms, duration     Orders  Placed This Encounter  Procedures  . Flu Vaccine QUAD 36+ mos IM  . Comprehensive metabolic panel    Order Specific Question:  Has the patient fasted?    Answer:  No  . CK  . Ambulatory referral to Ophthalmology    Referral Priority:  Routine    Referral Type:  Consultation    Referral Reason:  Specialty Services Required    Requested Specialty:  Ophthalmology    Number of Visits Requested:  1    Meds ordered this encounter  Medications  . cetirizine (ZYRTEC) 10 MG tablet    Sig: Take 1 tablet (10 mg total) by mouth daily.    Dispense:  30 tablet    Refill:  11  . fluticasone (FLONASE) 50 MCG/ACT nasal spray    Sig: Place 2 sprays into both nostrils daily.    Dispense:  16 g    Refill:  6  . losartan (COZAAR) 50 MG tablet    Sig: Take 1 tablet (50 mg total) by mouth daily.     Dispense:  30 tablet    Refill:  [facility] PGY-2, [place]

## 2015-02-22 ENCOUNTER — Encounter: Payer: Self-pay | Admitting: Family Medicine

## 2015-03-23 ENCOUNTER — Encounter: Payer: Self-pay | Admitting: Family Medicine

## 2015-03-23 ENCOUNTER — Ambulatory Visit (INDEPENDENT_AMBULATORY_CARE_PROVIDER_SITE_OTHER): Payer: Commercial Managed Care - HMO | Admitting: Family Medicine

## 2015-03-23 ENCOUNTER — Ambulatory Visit
Admission: RE | Admit: 2015-03-23 | Discharge: 2015-03-23 | Disposition: A | Discharge status: Home / Self Care | Payer: Commercial Managed Care - HMO | Source: Ambulatory Visit

## 2015-03-23 VITALS — BP 144/102 | HR 82 | Temp 98.2°F | Ht 63.0 in | Wt 226.0 lb

## 2015-03-23 DIAGNOSIS — M79605 Pain in left leg: Secondary | ICD-10-CM

## 2015-03-23 DIAGNOSIS — I10 Essential (primary) hypertension: Secondary | ICD-10-CM

## 2015-03-23 DIAGNOSIS — M79604 Pain in right leg: Secondary | ICD-10-CM | POA: Diagnosis not present

## 2015-03-23 DIAGNOSIS — R5381 Other malaise: Secondary | ICD-10-CM

## 2015-03-23 DIAGNOSIS — M5440 Lumbago with sciatica, unspecified side: Secondary | ICD-10-CM

## 2015-03-23 DIAGNOSIS — M545 Low back pain: Secondary | ICD-10-CM

## 2015-03-23 DIAGNOSIS — Z1231 Encounter for screening mammogram for malignant neoplasm of breast: Secondary | ICD-10-CM | POA: Diagnosis not present

## 2015-03-23 DIAGNOSIS — M5430 Sciatica, unspecified side: Secondary | ICD-10-CM

## 2015-03-23 MED ORDER — GABAPENTIN 300 MG PO CAPS: 600.0000 mg | ORAL_CAPSULE | Freq: Three times a day (TID) | ORAL | Status: DC

## 2015-03-23 MED ORDER — CYCLOBENZAPRINE HCL 10 MG PO TABS: 10.0000 mg | ORAL_TABLET | Freq: Every evening | ORAL | Status: DC | PRN

## 2015-03-23 MED ORDER — CARVEDILOL 25 MG PO TABS: 25.0000 mg | ORAL_TABLET | Freq: Two times a day (BID) | ORAL | Status: DC

## 2015-03-23 NOTE — Patient Instructions (Signed)
Increase coreg to 25mg  twice daily. I have prescribed a new dosage of the gabapentin: 600mg  three times a day, if this makes you too sleepy you can decrease it. I have also prescribed you Flexeril to help with the muscle spasms, it can make you sleepy as well The neurology office will contact you with an appointment time.  Please seek medical care immediate if you lose control of your bladder or bowel of find you have worsening weakness.

## 2015-03-23 NOTE — Progress Notes (Signed)
Patient ID: Kristen [name], female   DOB: [birth year], 54 y.o.   MRN: [id number]    Subjective: CC: f/u HTN HPI: Patient is a 54 y.o. female with a past medical history of HTN, HLD, OSA presenting to clinic today for a f/u HTN and concerns of back pain  Back pain: Pain starts in her lower back that is throbbing, moves down to her legs in a shooting/nagging pain. Pain is constant.  It is hard to walk and sleep. This pain has been going on for over 3 months. She endorses numbness in her legs that has been present for 2 months now. She kept thinking it would improve (as it normally does) but it hasn't.  She's taking 400mg  TID and it isn't helping. Denies any drowsiness with this regimen. Asks for "pain pills." Denies any saddle paresthesias or bowel/bladder incontinence.   Hypertension Blood pressure at home: doesn't check Blood pressure today: 153/104, repeat 144/102  Meds: Compliant with losartan 50mg  , coreg 12.5mg  BID, and HCTZ. Side effects: None ROS: Denies headache, dizziness, visual changes, nausea, vomiting, chest pain, abdominal pain or shortness of breath.  Social History: former smoker   Health Maintenance: patient due to mammogram (12/2014)   ROS: All other systems reviewed and are negative.  Past Medical History Patient Active Problem List   Diagnosis Date Noted  . Dizziness 02/21/2015  . 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 Myalgia 12/18/2014  . Left ankle pain 09/06/2014  . Toe pain 09/06/2014  . Routine adult health maintenance 07/20/2014  . Hamstring injury 12/22/2013  . Left shoulder pain 09/29/2013  . Bilateral edema of lower extremity 09/29/2013  . Breast pain 01/07/2013  . Chest pain, atypical 08/28/2012  . New onset of headaches 08/05/2012  . Neck pain on left side 12/26/2011  . Obstructive sleep apnea 12/26/2011  . Fatigue 09/13/2011  . Prediabetes 07/18/2011  . Back pain of lumbar region with sciatica 04/26/2010  . Morbid obesity ([place]) 04/16/2010  . UNSPEC  HEREDIT&IDIOPATHIC PERIPHERAL NEUROPATHY 04/16/2010  . GAIT DISTURBANCE 05/03/2009  . Allergic rhinitis 09/15/2008  . MENOPAUSAL SYNDROME 09/05/2008  . Hyperlipemia 06/12/2007  . HYPERTENSION, BENIGN SYSTEMIC 07/17/2006    Medications- reviewed and updated Current Outpatient Prescriptions  Medication Sig Dispense Refill  . albuterol (PROVENTIL HFA;VENTOLIN HFA) 108 (90 BASE) MCG/ACT inhaler Inhale 1-2 puffs into the lungs every 6 (six) hours as needed for wheezing or shortness of breath. 1 Inhaler 3  . carvedilol (COREG) 25 MG tablet Take 1 tablet (25 mg total) by mouth 2 (two) times daily with a meal. 60 tablet 2  . cetirizine (ZYRTEC) 10 MG tablet Take 1 tablet (10 mg total) by mouth daily. 30 tablet 11  . cyclobenzaprine (FLEXERIL) 10 MG tablet Take 1 tablet (10 mg total) by mouth at bedtime as needed for muscle spasms. 30 tablet 0  . fluticasone (FLONASE) 50 MCG/ACT nasal spray Place 2 sprays into both nostrils daily. 16 g 6  . gabapentin (NEURONTIN) 300 MG capsule Take 2 capsules (600 mg total) by mouth 3 (three) times daily. 180 capsule 1  . hydrochlorothiazide (HYDRODIURIL) 25 MG tablet Take 1 tablet (25 mg total) by mouth daily. 30 tablet 3  . ibuprofen (ADVIL,MOTRIN) 600 MG tablet Take 1 tablet (600 mg total) by mouth every 8 (eight) hours as needed. 30 tablet 1  . losartan (COZAAR) 50 MG tablet Take 1 tablet (50 mg total) by mouth daily. 30 tablet 1  . montelukast (SINGULAIR) 10 MG tablet Take 1 tablet (10 mg total)  by mouth at bedtime. 30 tablet 3  . pantoprazole (PROTONIX) 20 MG tablet Take 1 tablet (20 mg total) by mouth daily. 20 tablet 30   No current facility-administered medications for this visit.    Objective: Office vital signs reviewed. BP 144/102 mmHg  Pulse 82  Temp(Src) 98.2 F (36.8 C) (Oral)  Ht 5\' 3"  (1.6 m)  Wt 226 lb (102.513 kg)  BMI 40.04 kg/m2   Physical Examination:  General: Awake, alert, well- nourished, NAD. Obese Cardio: RRR, no m/r/g noted.  Trace pitting edema  Pulm: No increased WOB.  CTAB, without wheezes, rhonchi or crackles noted.  MSK: Stated TTP over the lumbar region in the midline and paraspinal muscles bilaterally. No drop offs noted. Negative straight leg raise. Walks with a cane Neuro: 5/5 strength in knee flexion and extension bilaterally. 4/5 strength in the R hip, 5/5 in the L hip. Sensation intact to light touch bilaterally. Difficult to illicit DTRs.   MRI Lumbar region 12/11: 1. Endplate marrow edema and mild prevertebral stranding at L5-S1. Favor degenerative etiology over diskitis osteomyelitis, but infection should be considered - especially if the patient is diabetic and/or immunocompromised. 2. Postoperative changes at L4-L5 and L5-S1 with improved patency of the thecal sac at both levels. However, mass effect on the left S1 nerve root persists at L5-S1, and mild spinal stenosis with moderate to severe bilateral foraminal stenosis persist at L4-L5. 3. Moderate facet degeneration in the upper lumbar spine.  Assessment/Plan: HYPERTENSION, BENIGN SYSTEMIC BPs still not under control despite reported compliance. Her losartan was recently decreased due an increase in SCr that has now resolved. Patient HR in the 80s at this time. - increase Coreg to 25mg  BID - continue losartan and HCTZ - patient to follow up in 1 month.   Back pain of lumbar region with sciatica Pain has worsened, patient feels she's now "immune" to gabapentin. She denies any drowsiness with 400g with Gabapentin. No new symptoms from previous. No concerning symptoms such as bowel/bladder incontinence or saddle paresthesias. Per previous PCP notes she has been seen by neurosurgery however I cannot find their records, the last I see is that she had surgery back in 2009. She did see [facility] in 2012 for these same symptoms, at that time they suggested bilateral leg nerve conduction studies and EMGs but these were not done per the patient's  report. The patient's weight is most likely a large contributor as well.  - Referral back to neurology, could also consider re-imaging  - Increase gabapentin to 600mg  TID, advised pt she could back down if she starts to get drowsy - Added Flexeril qHS Prn spasms  - RTC precautions discussed    Orders Placed This Encounter  Procedures  . Ambulatory referral to Neurology    Referral Priority:  Routine    Referral Type:  Consultation    Referral Reason:  Specialty Services Required    Requested Specialty:  Neurology    Number of Visits Requested:  1    Meds ordered this encounter  Medications  . carvedilol (COREG) 25 MG tablet    Sig: Take 1 tablet (25 mg total) by mouth 2 (two) times daily with a meal.    Dispense:  60 tablet    Refill:  2  . gabapentin (NEURONTIN) 300 MG capsule    Sig: Take 2 capsules (600 mg total) by mouth 3 (three) times daily.    Dispense:  180 capsule    Refill:  1  . cyclobenzaprine (  FLEXERIL) 10 MG tablet    Sig: Take 1 tablet (10 mg total) by mouth at bedtime as needed for muscle spasms.    Dispense:  30 tablet    Refill:  [place] PGY-2, [place]

## 2015-03-23 NOTE — Assessment & Plan Note (Signed)
BPs still not under control despite reported compliance. Her losartan was recently decreased due an increase in SCr that has now resolved. Patient HR in the 80s at this time. - increase Coreg to 25mg  BID - continue losartan and HCTZ - patient to follow up in 1 month.

## 2015-03-23 NOTE — Assessment & Plan Note (Signed)
Pain has worsened, patient feels she's now "immune" to gabapentin. She denies any drowsiness with 400g with Gabapentin. No new symptoms from previous. No concerning symptoms such as bowel/bladder incontinence or saddle paresthesias. Per previous PCP notes she has been seen by neurosurgery however I cannot find their records, the last I see is that she had surgery back in 2009. She did see [facility] in 2012 for these same symptoms, at that time they suggested bilateral leg nerve conduction studies and EMGs but these were not done per the patient's report. The patient's weight is most likely a large contributor as well.  - Referral back to neurology, could also consider re-imaging  - Increase gabapentin to 600mg  TID, advised pt she could back down if she starts to get drowsy - Added Flexeril qHS Prn spasms  - RTC precautions discussed

## 2015-03-27 ENCOUNTER — Encounter: Payer: Self-pay | Admitting: Family Medicine

## 2015-03-31 ENCOUNTER — Ambulatory Visit (INDEPENDENT_AMBULATORY_CARE_PROVIDER_SITE_OTHER): Payer: Commercial Managed Care - HMO | Admitting: Diagnostic Neuroimaging

## 2015-03-31 ENCOUNTER — Encounter: Payer: Self-pay | Admitting: Diagnostic Neuroimaging

## 2015-03-31 VITALS — BP 146/96 | HR 90 | Ht 63.0 in | Wt 230.0 lb

## 2015-03-31 DIAGNOSIS — M5481 Occipital neuralgia: Secondary | ICD-10-CM

## 2015-03-31 DIAGNOSIS — M5416 Radiculopathy, lumbar region: Secondary | ICD-10-CM | POA: Diagnosis not present

## 2015-03-31 NOTE — Patient Instructions (Signed)

## 2015-03-31 NOTE — Progress Notes (Signed)
[facility]  PATIENT: Kristen [name] DOB: 03-Oct-1960  REFERRING CLINICIAN: Lorenso Courier HISTORY FROM: patient  REASON FOR VISIT: new consult   HISTORICAL  CHIEF COMPLAINT:  Chief Complaint  Patient presents with  . New Patient (Initial Visit)    Room #6.  She is here with her granddaughter, Kristen [name].  . Back Pain    She started having back pain in 2009, after a fall,  that has progressively gotten worse.  She now has radiating pain, numbness and tingling down both legs and into her feet.  Her left side is worse than right.  She ambulates with the assistance of a cane.    HISTORY OF PRESENT ILLNESS:   NEW HPI ([facility], 03/31/15): 54 year old female with chronic low back pain radiating to left leg since 2009. S/p surg in 2009 by ortho. No relief. On gabapentin ~2400mg  daily (divided doses). Not been to PT or pain mgmt recently. Also with pain in the right neck/head/occipital region x 6 months. Prior noted from old [facility] system and EPIC reviewed.   PRIOR HPI ([place], 1021): " 54 year old right-handed black female with a history of morbid obesity and low back pain. This patient has undergone lumbosacral spine surgery in the past in 2009. Patient claims that she was having significant back pain and difficulty walking prior to surgery, but after surgery the pain worsened. Patient claims that she has pain in the back, and the pain radiates into the legs to the ankle level. Patient also has numbness into the lower extremities as well. Patient feels as if the legs are weak. Patient denies problems controlling the bowels or the bladder. Patient also reports some right arm discomfort with shooting pain into the arm and occasional neck pain. Patient claims that she has had MRI of the lumbosacral spine done, and this was reviewed online. This shows significant degenerative changes at the L5-S1 level with some marrow edema there is degenerative. Bilateral neuroforaminal stenosis is seen at  several levels. Patient comes to this office for an evaluation. The original back surgery was done by Dr. Gladstone Lighter, and the patient was apparently told that no more surgery could be done. Patient comes to the office today for nerve conduction studies of both legs and EMG studies of both legs."   REVIEW OF SYSTEMS: Full 14 system review of systems performed and notable only for joint pain cramps memory loss insomnia headache restless legs weight gain fatigue.   ALLERGIES: Allergies  Allergen Reactions  . Penicillins Hives, Itching and Rash  . Amoxicillin Hives    HOME MEDICATIONS: Outpatient Prescriptions Prior to Visit  Medication Sig Dispense Refill  . albuterol (PROVENTIL HFA;VENTOLIN HFA) 108 (90 BASE) MCG/ACT inhaler Inhale 1-2 puffs into the lungs every 6 (six) hours as needed for wheezing or shortness of breath. 1 Inhaler 3  . cetirizine (ZYRTEC) 10 MG tablet Take 1 tablet (10 mg total) by mouth daily. 30 tablet 11  . cyclobenzaprine (FLEXERIL) 10 MG tablet Take 1 tablet (10 mg total) by mouth at bedtime as needed for muscle spasms. 30 tablet 0  . fluticasone (FLONASE) 50 MCG/ACT nasal spray Place 2 sprays into both nostrils daily. 16 g 6  . gabapentin (NEURONTIN) 300 MG capsule Take 2 capsules (600 mg total) by mouth 3 (three) times daily. 180 capsule 1  . hydrochlorothiazide (HYDRODIURIL) 25 MG tablet Take 1 tablet (25 mg total) by mouth daily. 30 tablet 3  . ibuprofen (ADVIL,MOTRIN) 600 MG tablet Take 1 tablet (600 mg total)  by mouth every 8 (eight) hours as needed. 30 tablet 1  . losartan (COZAAR) 50 MG tablet Take 1 tablet (50 mg total) by mouth daily. 30 tablet 1  . montelukast (SINGULAIR) 10 MG tablet Take 1 tablet (10 mg total) by mouth at bedtime. 30 tablet 3  . pantoprazole (PROTONIX) 20 MG tablet Take 1 tablet (20 mg total) by mouth daily. 20 tablet 30  . carvedilol (COREG) 25 MG tablet Take 1 tablet (25 mg total) by mouth 2 (two) times daily with a meal. 60 tablet 2   No  facility-administered medications prior to visit.    PAST MEDICAL HISTORY: Past Medical History  Diagnosis Date  . Hyperlipidemia   . Hypertension   . Back pain     Seen by Neurosurery  . Peripheral neuropathy ([place])   . Allergy   . Obesity   . Post-menopausal   . Leg pain     Bil    PAST SURGICAL HISTORY: Past Surgical History  Procedure Laterality Date  . Laminectomy  01/20/2008    L4-L5 by Dr Latanya Maudlin (Ortho), [facility]  . Tubal ligation    . Carpel tunnel release      FAMILY HISTORY: Family History  Problem Relation Age of Onset  . Lupus Mother   . Heart disease Mother   . Anemia Father     SOCIAL HISTORY:  Social History   Social History  . Marital Status: Legally Separated    Spouse Name: N/A  . Number of Children: 3  . Years of Education: 13   Occupational History  . Disabled    Social History Main Topics  . Smoking status: Former Research scientist (life sciences)  . Smokeless tobacco: Never Used     Comment: Quit 25 years ago.  . Alcohol Use: 0.0 oz/week    0 Standard drinks or equivalent per week     Comment: Only socially - maybe two times per year.  . Drug Use: Yes     Comment: Smokes marijuana twice daily.  Marland Kitchen Sexual Activity: Yes   Other Topics Concern  . Not on file   Social History Narrative   Lives at home with her granddaughter, Kristen [name].   Right-handed.   Occasional use of caffeine.     PHYSICAL EXAM  GENERAL EXAM/CONSTITUTIONAL: Vitals:  Filed Vitals:   03/31/15 1044  BP: 146/96  Pulse: 90  Height: 5\' 3"  (1.6 m)  Weight: 230 lb (104.327 kg)     Body mass index is 40.75 kg/(m^2).  Visual Acuity Screening   Right eye Left eye Both eyes  Without correction: 20/30 20/30-1   With correction:        Patient is in no distress; well developed, nourished and groomed; neck is supple  CARDIOVASCULAR:  Examination of carotid arteries is normal; no carotid bruits  Regular rate and rhythm, no murmurs  Examination of peripheral  vascular system by observation and palpation is normal  EYES:  Ophthalmoscopic exam of optic discs and posterior segments is normal; no papilledema or hemorrhages  MUSCULOSKELETAL:  Gait, strength, tone, movements noted in Neurologic exam below  NEUROLOGIC: MENTAL STATUS:  No flowsheet data found.  awake, alert, oriented to person, place and time  recent and remote memory intact  normal attention and concentration  language fluent, comprehension intact, naming intact,   fund of knowledge appropriate  CRANIAL NERVE:   2nd - no papilledema on fundoscopic exam  2nd, 3rd, 4th, 6th - pupils equal and reactive to light, visual fields full  to confrontation, extraocular muscles intact, no nystagmus  5th - facial sensation symmetric  7th - facial strength symmetric  8th - hearing intact  9th - palate elevates symmetrically, uvula midline  11th - shoulder shrug symmetric  12th - tongue protrusion midline  MOTOR:   normal bulk and tone; BUE 4; BLE 3-4  SENSORY:   normal and symmetric to light touch, temperature, vibration   COORDINATION:   finger-nose-finger, fine finger movements normal  REFLEXES:   deep tendon reflexes TRACE and symmetric; ABSENT AT ANKLES  GAIT/STATION:   narrow based gait; WADDLING GAIT; UNSTEADY AND SLOW; ANTALGIC GAIT    DIAGNOSTIC DATA (LABS, IMAGING, TESTING) - I reviewed patient records, labs, notes, testing and imaging myself where available.  Lab Results  Component Value Date   WBC 8.0 05/23/2014   HGB 12.8 05/23/2014   HCT 40.1 05/23/2014   MCV 85.7 05/23/2014   PLT 222 05/23/2014      Component Value Date/Time   NA 141 02/21/2015 1447   K 4.4 02/21/2015 1447   CL 105 02/21/2015 1447   CO2 30 02/21/2015 1447   GLUCOSE 84 02/21/2015 1447   BUN 11 02/21/2015 1447   CREATININE 1.02 02/21/2015 1447   CREATININE 0.91 05/23/2014 2121   CALCIUM 9.2 02/21/2015 1447   PROT 6.7 02/21/2015 1447   ALBUMIN 3.5* 02/21/2015  1447   AST 21 02/21/2015 1447   ALT 16 02/21/2015 1447   ALKPHOS 89 02/21/2015 1447   BILITOT 0.4 02/21/2015 1447   GFRNONAA 71* 05/23/2014 2121   GFRNONAA 58* 07/21/2013 1007   GFRAA 83* 05/23/2014 2121   GFRAA 66 07/21/2013 1007   Lab Results  Component Value Date   CHOL 225* 07/19/2014   HDL 45* 07/19/2014   LDLCALC 161* 07/19/2014   LDLDIRECT 76 10/28/2013   TRIG 94 07/19/2014   CHOLHDL 5.0 07/19/2014   Lab Results  Component Value Date   HGBA1C 5.0 06/22/2013   Lab Results  Component Value Date   VITAMINB12 491 04/16/2010   Lab Results  Component Value Date   TSH 1.850 09/13/2011    04/06/14 CT head  - normal  02/22/12 MRI cervical spine 1. Spondylosis and a right foraminal disc osteophyte complex at C5-C6 contribute to mild cord flattening and moderate right foraminal stenosis. 2. Shallow posterolateral disc protrusion on the right C4-C5 without cord deformity or significant foraminal stenosis.  3. Mild to moderate biforaminal stenosis at C3-C4. 4. Asymmetric facet hypertrophy on the left at C2-C3.  08/07/10 EMG/NCS  - Nerve conduction studies on both legs were normal. No evidence of a peripheral neuropathy is seen. EMG study of the right leg was essentially normal, without evidence of a lumbosacral radiculopathy. EMG of the left lower extremity showed findings consistent with an acute left L5 radiculopathy.  04/29/10 MRI lumbar spine  1. Endplate marrow edema and mild prevertebral stranding at L5-S1. Favor degenerative etiology over diskitis osteomyelitis, but infection should be considered - especially if the patient is diabetic and/or immunocompromised. 2. Postoperative changes at L4-L5 and L5-S1 with improved patency of the thecal sac at both levels. However, mass effect on the left S1 nerve root persists at L5-S1, and mild spinal stenosis with moderate to severe bilateral foraminal stenosis persist at L4-L5. See above details. 3. Moderate facet  degeneration in the upper lumbar spine.    ASSESSMENT AND PLAN  54 y.o. year old female here with chronic low back pain radiating to the left leg since 2009. Also with right head/neck pain  x 6 months. On gabapentin 2400mg  daily. Patient not interested in further surgical evaluations. No further imaging indicated at this time. Will help patient pursue conservative mgmt.   Dx:  Lumbar radiculopathy, chronic  Occipital neuralgia of right side    PLAN: - refer to PT and pain mgmt  Return if symptoms worsen or fail to improve, for return to PCP.    Penni Bombard, MD [id number], [id number] AM Certified in Neurology, Neurophysiology and Neuroimaging  [facility] [address], [place], [place] [address] [phone]

## 2015-04-04 ENCOUNTER — Telehealth: Payer: Self-pay | Admitting: Family Medicine

## 2015-04-04 NOTE — Telephone Encounter (Signed)
gabapentin is no working-would like to get something stronger

## 2015-04-04 NOTE — Telephone Encounter (Signed)
If the patient is having worsening pain or the increased gabapentin and addition of Flexeril are not helping, please have her come in for an evaluation. She is not a chronic pain patient so if she needs to see another provider, this would be okay.  Thanks, Archie Patten, MD [facility] Resident  04/04/2015, 2:41 PM

## 2015-04-05 ENCOUNTER — Ambulatory Visit: Payer: Commercial Managed Care - HMO | Admitting: Family Medicine

## 2015-04-05 NOTE — Telephone Encounter (Signed)
Patient has SDA scheduled.

## 2015-04-06 ENCOUNTER — Ambulatory Visit (INDEPENDENT_AMBULATORY_CARE_PROVIDER_SITE_OTHER): Payer: Commercial Managed Care - HMO | Admitting: Family Medicine

## 2015-04-06 ENCOUNTER — Telehealth: Payer: Self-pay | Admitting: Diagnostic Neuroimaging

## 2015-04-06 VITALS — BP 138/96 | HR 83 | Temp 98.0°F | Ht 63.0 in | Wt 227.0 lb

## 2015-04-06 DIAGNOSIS — M545 Low back pain: Secondary | ICD-10-CM

## 2015-04-06 DIAGNOSIS — M5430 Sciatica, unspecified side: Secondary | ICD-10-CM | POA: Diagnosis not present

## 2015-04-06 DIAGNOSIS — M5440 Lumbago with sciatica, unspecified side: Secondary | ICD-10-CM

## 2015-04-06 MED ORDER — DICLOFENAC SODIUM 75 MG PO TBEC: 75.0000 mg | DELAYED_RELEASE_TABLET | Freq: Two times a day (BID) | ORAL | Status: DC

## 2015-04-06 NOTE — Telephone Encounter (Signed)
Tia , with [facility] called to speak with Hinton Dyer about pt's referral. Needed clarification on which physician pt is scheduled to see at [facility]. [phone]

## 2015-04-06 NOTE — Progress Notes (Signed)
Subjective: Kristen [name] is a 54 y.o. female with a history of chronic low back pain here for evaluation of back pain.  Pain began after a fall in 2009 followed by surgery that year with Dr. Gladstone Lighter who has since reported that no further surgery should be attempted. Pain is actually unchanged from prior visits with her PCP described as chronic, waxing/waning, aching and shooting, worse with movement/bending, not improved significantly with flexeril or increased gabapentin dose. She reports L > R tingling sensations down both legs which are stable. She has been seen at a neurologist for this problem but does not believe this was helpful. She has tried many medications in the past which were unhelpful and physical therapy in the past was somewhat helpful but painful during sessions.   - ROS: Pt denies any current bowel/bladder problems, fever, chills, unintentional weight loss, night time awakenings secondary to pain, weakness in one or both legs. - Remote former smoker, no EtOH, noIV drug use  Objective: BP 138/96 mmHg  Pulse 83  Temp(Src) 98 F (36.7 C) (Oral)  Ht 5\' 3"  (1.6 m)  Wt 227 lb (102.967 kg)  BMI 40.22 kg/m2  SpO2 99% Gen:  54 y.o. female in no distress Back:  Normal skin. Spine with normal alignment and no deformity, vertical lumbosacral incision scar noted. No tenderness to vertebral process palpation. Paraspinous muscles are tender and spastic R > L. Range of motion is full at neck and lumbar sacral regions. Negative straight leg raise. Walks with a cane Neuro: 5/5 strength in knee flexion and extension bilaterally. 4/5 strength in the R hip, 5/5 in the L hip. Sensation intact to light touch bilaterally. DTRs 1+ patellar, no clonus.  MRI Lumbar region 12/11: 1. Endplate marrow edema and mild prevertebral stranding at L5-S1. Favor degenerative etiology over diskitis osteomyelitis, but infection should be considered - especially if the patient is diabetic and/or  immunocompromised. 2. Postoperative changes at L4-L5 and L5-S1 with improved patency of the thecal sac at both levels. However, mass effect on the left S1 nerve root persists at L5-S1, and mild spinal stenosis with moderate to severe bilateral foraminal stenosis persist at L4-L5. 3. Moderate facet degeneration in the upper lumbar spine.  Assessment & Plan: Kristen [name] is a 54 y.o. female here for back pain due to spinal and foraminal stenosis from degenerative etiology.  Back pain of lumbar region with sciatica Dating back to 2009 with surgery that was unsuccessful in treating pain. No indications for urgent imaging/intervention. Difficult situation as she has failed many medications - previous narcotic contract noted. After long discussion of risks and benefits of NSAIDs, tylenol, narcotics, anti-spasmodics, physical therapy, surgical referral/reimaging, we decided to attempt pain control with NSAID which she has not tried before - diclofenac, and to use this to help with pain control during physical therapy which will improve her functional status and likely her pain both directly and by augmenting weight loss efforts. She will continue gabapentin at 1800mg  TDD, and flexeril. Neurology referred her to PT and pain management. Defer reimaging or referral to PCP.

## 2015-04-06 NOTE — Assessment & Plan Note (Addendum)
Dating back to 2009 with surgery that was unsuccessful in treating pain. No indications for urgent imaging/intervention. Difficult situation as she has failed many medications - previous narcotic contract noted. After long discussion of risks and benefits of NSAIDs, tylenol, narcotics, anti-spasmodics, physical therapy, surgical referral/reimaging, we decided to attempt pain control with NSAID which she has not tried before - diclofenac, and to use this to help with pain control during physical therapy which will improve her functional status and likely her pain both directly and by augmenting weight loss efforts. She will continue gabapentin at 1800mg  TDD, and flexeril. Neurology referred her to PT and pain management. Defer reimaging or referral to PCP.

## 2015-04-06 NOTE — Patient Instructions (Addendum)
Thank you for coming in today!  - Don't give up! We'll find something to help.  - Start taking voltaren (diclofenac) twice a day every day to relieve inflammation-related pain. This has been sent to your pharmacy.  - If you don't get a contact in the next week, please call us to see what our referral coordinator can do for you.   Our clinic's number is [phone]. Feel free to call any time with questions or concerns. We will answer any questions after hours with our 24-hour emergency line at that number as well.   - Dr. Bonner Puna

## 2015-04-06 NOTE — Telephone Encounter (Signed)
Called PCP PA needs to come from there PCP for the Neuro surgery referral . Patient's apt. In December.

## 2015-04-17 ENCOUNTER — Telehealth: Payer: Self-pay | Admitting: *Deleted

## 2015-04-17 NOTE — Telephone Encounter (Signed)
Patient left message on nurse line stating that the pain medication given last time is making her nauseas.  Patient is requesting Percocet or Norco.  Please give her a call at [phone].  Derl Barrow, RN

## 2015-04-17 NOTE — Telephone Encounter (Signed)
Will forward to prescribing MD.

## 2015-04-18 NOTE — Telephone Encounter (Signed)
Contacted pt to inform her of below and she was very upset because she stated she was in pain and that the nausea medicine would not be suffice.  I checked to see about scheduling her with her PCP and the pt stated she couldn't wait till her next regular open slot which was later in December.  I had a red team doctor available for next week and she said she couldn't wait that long.  Was going to schedule her with Dr. Ardelia Mems on Thursday and she stated that she couldn't wait till  Wednesday or Thursday she was in pain and so she would go to the Urgent care.  She stated that she would see [place] when she sees [place].  I apologized and the phone call was ended. Katharina Caper, April D, [place]

## 2015-04-18 NOTE — Telephone Encounter (Signed)
I do not believe percocet or norco are good medical care for this patient at this time. Though she told me she had recently been referred to pain management. She has had pain contracts in the past which would be required again (this is a chronic problem from 2009 and not acute nor changed pain) and I would urge her to schedule an appointment with here PCP to determine the next steps. I can send in a medication for nausea but that is not a good long term solution.   Will forward to team to discuss this with patient and to PCP in case another course of action is desired.

## 2015-07-09 ENCOUNTER — Encounter (HOSPITAL_COMMUNITY): Payer: Self-pay | Admitting: Emergency Medicine

## 2015-07-09 ENCOUNTER — Emergency Department (HOSPITAL_COMMUNITY)
Admission: EM | Admit: 2015-07-09 | Discharge: 2015-07-09 | Disposition: A | Discharge status: Home / Self Care | Payer: Commercial Managed Care - HMO | Attending: Emergency Medicine | Admitting: Emergency Medicine

## 2015-07-09 DIAGNOSIS — K0889 Other specified disorders of teeth and supporting structures: Secondary | ICD-10-CM | POA: Diagnosis not present

## 2015-07-09 DIAGNOSIS — E669 Obesity, unspecified: Secondary | ICD-10-CM | POA: Diagnosis not present

## 2015-07-09 DIAGNOSIS — K002 Abnormalities of size and form of teeth: Secondary | ICD-10-CM | POA: Diagnosis not present

## 2015-07-09 DIAGNOSIS — Z9889 Other specified postprocedural states: Secondary | ICD-10-CM | POA: Insufficient documentation

## 2015-07-09 DIAGNOSIS — Z79899 Other long term (current) drug therapy: Secondary | ICD-10-CM | POA: Insufficient documentation

## 2015-07-09 DIAGNOSIS — K0381 Cracked tooth: Secondary | ICD-10-CM | POA: Insufficient documentation

## 2015-07-09 DIAGNOSIS — K029 Dental caries, unspecified: Secondary | ICD-10-CM | POA: Insufficient documentation

## 2015-07-09 DIAGNOSIS — M546 Pain in thoracic spine: Secondary | ICD-10-CM | POA: Diagnosis not present

## 2015-07-09 DIAGNOSIS — Z880 Allergy status to penicillin: Secondary | ICD-10-CM | POA: Insufficient documentation

## 2015-07-09 DIAGNOSIS — I10 Essential (primary) hypertension: Secondary | ICD-10-CM | POA: Diagnosis not present

## 2015-07-09 DIAGNOSIS — G629 Polyneuropathy, unspecified: Secondary | ICD-10-CM | POA: Diagnosis not present

## 2015-07-09 DIAGNOSIS — G8929 Other chronic pain: Secondary | ICD-10-CM | POA: Insufficient documentation

## 2015-07-09 DIAGNOSIS — Z87891 Personal history of nicotine dependence: Secondary | ICD-10-CM | POA: Insufficient documentation

## 2015-07-09 MED ORDER — CLINDAMYCIN HCL 150 MG PO CAPS: 300.0000 mg | ORAL_CAPSULE | Freq: Three times a day (TID) | ORAL | Status: DC

## 2015-07-09 MED ORDER — HYDROCODONE-ACETAMINOPHEN 5-325 MG PO TABS: 2.0000 | ORAL_TABLET | ORAL | Status: DC | PRN

## 2015-07-09 MED ORDER — HYDROCODONE-ACETAMINOPHEN 5-325 MG PO TABS
1.0000 | ORAL_TABLET | Freq: Once | ORAL | Status: AC
  Administered 2015-07-09: 1 via ORAL
  Filled 2015-07-09: qty 1

## 2015-07-09 MED ORDER — DIAZEPAM 5 MG PO TABS: 5.0000 mg | ORAL_TABLET | Freq: Two times a day (BID) | ORAL | Status: DC

## 2015-07-09 MED ORDER — DIAZEPAM 5 MG PO TABS
5.0000 mg | ORAL_TABLET | Freq: Once | ORAL | Status: AC
  Administered 2015-07-09: 5 mg via ORAL
  Filled 2015-07-09: qty 1

## 2015-07-09 MED ORDER — IBUPROFEN 800 MG PO TABS: 800.0000 mg | ORAL_TABLET | Freq: Three times a day (TID) | ORAL | Status: DC

## 2015-07-09 MED ORDER — BUPIVACAINE-EPINEPHRINE (PF) 0.5% -1:200000 IJ SOLN
1.8000 mL | Freq: Once | INTRAMUSCULAR | Status: AC
  Administered 2015-07-09: 1.8 mL
  Filled 2015-07-09: qty 1.8

## 2015-07-09 NOTE — Discharge Instructions (Signed)
Take your medication as prescribed. I also recommend applying ice or heat to your back for 15-20 minutes 3-4 times daily for pain relief. Follow-up with your primary care provider in the next week as needed he continued having back pain. I recommend following up with one of the dental clinics listed below to schedule a appointment for further management of your dental pain. Please return to the Emergency Department if symptoms worsen or new onset of fever, facial/neck swelling, drainage, difficulty breathing, difficulty swallowing/drooling, difficulty opening your jaw, numbness, tingling, groin anesthesia, loss of bowel or bladder, weakness.  [place] [address] [place], [place] [address] Phone [phone]  The [employer] in [place], [place], exemplifies the Health Net vision to improve the health and quality of life of all [place] by Regulatory affairs officer with a passion to care for the underserved and by leading the nation in community-based, service learning oral health education.  We are committed to offering comprehensive general dental services for adults, children and special needs patients in a safe, caring and professional setting.   Appointments: Our clinic is open Monday through Friday 8:00 a.m. until 5:00 p.m. The amount of time scheduled for an appointment depends on the patients specific needs. We ask that you keep your appointed time for care or provide 24-hour notice of all appointment changes. Parents or legal guardians must accompany minor children.   Payment for Services: Medicaid and other insurance plans are welcome. Payment for services is due when services are rendered and may be made by cash or credit card. If you have dental insurance, we will assist you with your claim submission.   Emergencies:  Emergency services will be provided Monday through Friday on a walk-in basis.  Please arrive early for emergency services. After hours emergency services will be provided for patients of record as required.   Services:  Comprehensive General Dentistry Childrens Dentistry Oral Surgery - Extractions Root Canals Sealants and Tooth Colored Fillings Crowns and Bridges Dentures and Partial Dentures Implant Services Periodontal Services and Nurse, adult 3-D/Cone Beam Imaging    Emergency Department Resource Guide 1) Find a Doctor and Pay Out of Pocket Although you won't have to find out who is covered by your insurance plan, it is a good idea to ask around and get recommendations. You will then need to call the office and see if the doctor you have chosen will accept you as a new patient and what types of options they offer for patients who are self-pay. Some doctors offer discounts or will set up payment plans for their patients who do not have insurance, but you will need to ask so you aren't surprised when you get to your appointment.  2) Contact Your Local Health Department Not all health departments have doctors that can see patients for sick visits, but many do, so it is worth a call to see if yours does. If you don't know where your local health department is, you can check in your phone book. The CDC also has a tool to help you locate your state's health department, and many state websites also have listings of all of their local health departments.  3) Find a [facility] If your illness is not likely to be very severe or complicated, you may want to try a walk in clinic. These are popping up all over the  country in pharmacies, drugstores, and shopping centers. They're usually staffed by nurse practitioners or physician assistants that have been trained to treat common illnesses and complaints. They're usually fairly quick and  inexpensive. However, if you have serious medical issues or chronic medical problems, these are probably not your best option.  No Primary Care Doctor: - Call Health Connect at  [phone] - they can help you locate a primary care doctor that  accepts your insurance, provides certain services, etc. - Physician Referral Service- [phone]  Chronic Pain Problems: Organization         Address  Phone   Notes  [facility]  [phone] Patients need to be referred by their primary care doctor.   Medication Assistance: Organization         Address  Phone   Notes  [facility] Medication [facility] [address] [place], [place] [address] [phone] --Must be a resident of [facility] -- Must have NO insurance coverage whatsoever (no Medicaid/ Medicare, etc.) -- The pt. MUST have a primary care doctor that directs their care regularly and follows them in the community   MedAssist  [phone]   [employer]  [phone]    Agencies that provide inexpensive medical care: Organization         Address  Phone   Notes  [place]  [phone]   [facility]    [phone]   [facility] [place],  [address] [phone]   [place] [address], [place] [phone]   Planned Parenthood    [phone]   [facility]    [phone]   [place] and [address], [place] Phone:  [phone], Fax:  [phone] Hours of Operation:  9 am - 6 pm, M-F.  Also accepts Medicaid/Medicare and self-pay.  [facility] for [place] [address], [address], [place] Phone: [phone], Fax: [phone]. Hours of Operation:  8:30 am - 5:30 pm, M-F.  Also accepts Medicaid and self-pay.  [facility] [place] [address], [place] Phone: [phone]   [place], [place], [place] [phone], Ext. [phone] Mondays & Thursdays: 7-9 AM.  First 15 patients are seen on a first come, first serve basis.    [place] Providers:  Organization         Address  Phone   Notes  [facility] [address], [place] [phone] Also accepts self-pay patients.  [facility] [id number] [place], [place]  [phone]   [place], [address], [place] [phone]   [facility] [address], [place] [phone]   Lucianne Lei [address], [place]   [phone] Only accepts [place] Access [place] patients after they have their name applied to their card.   Self-Pay (no insurance) in [facility]:  Organization         Address  Phone   Notes  Sickle Cell Patients, [facility] [place] [phone]   [facility] [place] [phone]   [facility]gent [place]  [place] [address], [place] [phone]   [facility]/Dr. Osei-Bonsu  [address] [place] or [address], [place] [phone] Phone number for both [place] and [place] locations is the same.  Urgent Medical and [facility] [address] [place] [phone]   [facility] [address] [place] or [address] [phone]   [facility] [address] [place] [phone], phone; [phone], fax Sees patients 1st and 3rd Saturday of every month.  Must not qualify for public or private insurance (i.e. Medicaid, Medicare, [place] Health Choice, Veterans' Benefits)  Household income should be no more than 200% of the poverty level The clinic cannot treat you if you are pregnant or  think you are pregnant  Sexually transmitted diseases are not treated at the clinic.    Dental Care: Organization         Address  Phone  Notes  [facility] Department of [facility] [place] [phone] Accepts children up to age 1 who are enrolled in [place] or [place]; pregnant women with a Medicaid card; and children who have applied for Medicaid or [place] Health Choice, but were declined, whose parents can pay a reduced fee at time of service.  [facility] Department of [facility]  [address], [place] [phone] Accepts children up to age 51 who are enrolled in [place] or [place]; pregnant women with a Medicaid card; and children who have applied for Medicaid or [place] Health Choice, but were declined, whose parents can pay a reduced fee at time of service.  [place] Adult Dental Access PROGRAM  [place] [phone] Patients are seen by appointment only. Walk-ins are not accepted. [place] will see patients 78 years of age and older. Monday - Tuesday (8am-5pm) Most Wednesdays (8:30-5pm) $30 per visit, cash only  [facility] Adult Dental Access PROGRAM  [address], [facility] [phone] Patients are seen by appointment only. Walk-ins are not accepted. [place] will see patients 6 years of age and older. One Wednesday Evening (Monthly: Volunteer Based).  $30 per visit, cash only  [place]  [phone] for adults; Children under age 26, call Graduate Pediatric Dentistry at [phone]. Children aged 36-14, please call [phone] to request a pediatric application.  Dental services are provided in all areas of dental care including fillings, crowns and bridges, complete and partial dentures, implants, gum treatment, root canals, and extractions. Preventive care is also provided. Treatment is provided to both adults  and children. Patients are selected via a lottery and there is often a waiting list.   [facility] [address] [place]  [phone] [email or web address]   [facility] [address] [place], [place] [phone], Ext. [phone] Second and Fourth Thursday of each month, opens at 6:30 AM; Clinic ends at 9 AM.  Patients are seen on a first-come first-served basis, and a limited number are seen during each clinic.   [facility]  [address] Hillard Danker [place], [place] [phone]   Eligibility Requirements You must have lived in [place], [place], or [place] counties for at least the last three months.   You cannot be eligible for state or federal sponsored [employer], including [employer], [place], or [employer].   You  generally cannot be eligible for healthcare insurance through your employer.    How to apply: Eligibility screenings are held every Tuesday and Wednesday afternoon from 1:00 pm until 4:00 pm. You do not need an appointment for the interview!  [facility] [address] [place], [place]   [place]  [place] Department  [place]  [phone]    Behavioral Health Resources in the Community: Intensive Outpatient Programs Organization         Address  Phone  Notes  [place]. [address], [place], [place] [phone]   [facility] Outpatient [address] [place], [place]   ADS: Alcohol & Drug Svcs [address] [place], [place]   [place] [address],  [place], [place] or [phone]   Substance Abuse Resources Organization         Address  Phone  Notes  Alcohol and Drug Services  [phone]   [place]  [phone]   The [place]     Chinita Pester  [phone]   Residential & Outpatient Substance Abuse Program  [phone]   Psychological Services Organization         Address  Phone  Notes  [facility] [place]  [place]  [phone]   [place] [address] [place] or [phone]    Mobile Crisis Teams Organization         Address  Phone  Notes  [facility]atives, Mobile Crisis Care Unit  [phone]   Assertive Psychotherapeutic Services  [address] [place], [place]   [place] Levels [address] [place] [phone]    Self-Help/Support Groups Organization         Address  Phone             Notes  [place]. of [place] - variety of support groups  [place] Call for more information  Narcotics Anonymous (NA), [facility] [address], [employer] [place]  2 meetings at this location   Special educational needs teacher         Address  Phone  Notes  ASAP Residential Treatment [place],    [place]  [phone]   [facility]  [address] [place] [id number], [place], [place]   [place], [place] [phone] Admissions: 8am-3pm M-F  Incentives Substance [place]mer[place] [address]    [place], [place] [phone]   [facility] [address] [place], [place], [place]   [facility] [address]  [place], [place]   [facility] Programs - Intensive Outpatient [address], [place], [place]   [facility] ([place] [address]  [place], [place] [phone] or [phone]   [facility]idential Treatment Services (RTS) [address] [place], [place] Accepts Medicaid  [facility] [place] [address],  [place] [phone] Substance Abuse/Addiction Treatment   [facility] Organization         Address  Phone  Notes  [facility]  [phone]   Domenic Schwab, PhD [address] [place], [place]   [phone] or [phone]   [place]  [place], [place] [phone]   [facility] [address], [place], [place] [phone] Insurance/Medicaid/sponsorship through [facility] and Families [address] [place], [place] [phone] [place], [place] [phone]    Dr. Adele Schilder  [phone]   [facility] Dept. 1) [address] [place] 2) [place] 3)  [place] [address], [place] [phone]  [phone]   [place] [phone] or [phone] (After Hours)

## 2015-07-09 NOTE — ED Notes (Signed)
Pt stating she has an abscessed tooth, also having muscle spasms from her back surgery in 2009.

## 2015-07-09 NOTE — ED Notes (Signed)
Patient was alert, oriented and stable upon discharge. RN went over AVS and patient had no further questions.  

## 2015-07-09 NOTE — ED Provider Notes (Signed)
History  By signing my name below, I, Marlowe Kays, attest that this documentation has been prepared under the direction and in the presence of Harlene Ramus, [place]. Electronically Signed: Marlowe Kays, ED Scribe. 07/09/2015. 3:52 PM.  Chief Complaint  Patient presents with  . Dental Pain  . Back Pain   The history is provided by the patient and medical records. No language interpreter was used.    HPI Comments:  Kristen [name] is a 55 y.o. obese female who presents to the Emergency Department complaining of severe upper right-sided dental pain that began this morning upon waking. She reports associated right-sided facial swelling. She reports she has a fractured tooth in that area that occurred some time ago. Pt has not taken anything for her dental pain. Eating increases her pain. She denies alleviating factors. She denies fever, chills, trismus, difficulty breathing or swallowing, drainage or sour taste in her mouth. She does not have a dentist. She reports allergy to PCN and Amoxicillin.  Pt also complains of severe, constant, dull, aching upper to mid back spasms that began this morning upon waking. She reports intermittent sharp pains. She reports having back surgery eight years ago for a ruptured disc in the same area and note she has chronic back pain at baseline. Movements, walking and deep breathing increase her pain. She denies alleviating factors. Pt reports taking Flexeril with no significant relief of the pain. She denies abdominal pain, nausea, vomiting, dysuria, numbness, tingling or weakness of any extremity, bowel or bladder incontinence, saddle anesthesia, fever or chills. She denies h/o cancer or IV drug use. She denies any recent chiropractic care or acupuncture. She denies any trauma, injury or fall.  Past Medical History  Diagnosis Date  . Hyperlipidemia   . Hypertension   . Back pain     Seen by Neurosurery  . Peripheral neuropathy ([place])   . Allergy   .  Obesity   . Post-menopausal   . Leg pain     Bil   Past Surgical History  Procedure Laterality Date  . Laminectomy  01/20/2008    L4-L5 by Dr Latanya Maudlin (Ortho), [facility]  . Tubal ligation    . Carpel tunnel release     Family History  Problem Relation Age of Onset  . Lupus Mother   . Heart disease Mother   . Anemia Father    Social History  Substance Use Topics  . Smoking status: Former Research scientist (life sciences)  . Smokeless tobacco: Never Used     Comment: Quit 25 years ago.  . Alcohol Use: 0.0 oz/week    0 Standard drinks or equivalent per week     Comment: Only socially - maybe two times per year.   OB History    No data available     Review of Systems  Constitutional: Negative for fever and chills.  HENT: Positive for dental problem and facial swelling.   Gastrointestinal: Negative for nausea, vomiting and abdominal pain.  Genitourinary: Negative for dysuria.  Musculoskeletal: Positive for back pain.  Neurological: Negative for weakness and numbness.    Allergies  Penicillins and Amoxicillin  Home Medications   Prior to Admission medications   Medication Sig Start Date End Date Taking? Authorizing Provider  albuterol (PROVENTIL HFA;VENTOLIN HFA) 108 (90 BASE) MCG/ACT inhaler Inhale 1-2 puffs into the lungs every 6 (six) hours as needed for wheezing or shortness of breath. 05/30/14  Yes Archie Patten, MD  carvedilol (COREG) 12.5 MG tablet Take 12.5 mg by mouth  2 (two) times daily with a meal. 02/10/15  Yes Historical Provider, MD  cetirizine (ZYRTEC) 10 MG tablet Take 1 tablet (10 mg total) by mouth daily. Patient taking differently: Take 10 mg by mouth daily as needed for allergies.  02/21/15  Yes Archie Patten, MD  cyclobenzaprine (FLEXERIL) 10 MG tablet Take 1 tablet (10 mg total) by mouth at bedtime as needed for muscle spasms. 03/23/15  Yes Archie Patten, MD  fluticasone (FLONASE) 50 MCG/ACT nasal spray Place 2 sprays into both nostrils daily. Patient  taking differently: Place 2 sprays into both nostrils daily as needed for allergies or rhinitis.  02/21/15  Yes Archie Patten, MD  gabapentin (NEURONTIN) 300 MG capsule Take 2 capsules (600 mg total) by mouth 3 (three) times daily. 03/23/15  Yes Archie Patten, MD  hydrochlorothiazide (HYDRODIURIL) 25 MG tablet Take 1 tablet (25 mg total) by mouth daily. 07/19/14  Yes Archie Patten, MD  losartan (COZAAR) 50 MG tablet Take 1 tablet (50 mg total) by mouth daily. 02/21/15  Yes Archie Patten, MD  montelukast (SINGULAIR) 10 MG tablet Take 1 tablet (10 mg total) by mouth at bedtime. Patient taking differently: Take 10 mg by mouth at bedtime as needed (allergies.).  07/19/14  Yes Archie Patten, MD  pantoprazole (PROTONIX) 20 MG tablet Take 1 tablet (20 mg total) by mouth daily. 07/19/14  Yes Archie Patten, MD  clindamycin (CLEOCIN) 150 MG capsule Take 2 capsules (300 mg total) by mouth 3 (three) times daily. May dispense as 150mg  capsules 07/09/15   Nona Dell, PA-C  diazepam (VALIUM) 5 MG tablet Take 1 tablet (5 mg total) by mouth 2 (two) times daily. 07/09/15   Nona Dell, PA-C  diclofenac (VOLTAREN) 75 MG EC tablet Take 1 tablet (75 mg total) by mouth 2 (two) times daily. Patient not taking: Reported on 07/09/2015 04/06/15   Patrecia Pour, MD  HYDROcodone-acetaminophen (NORCO/VICODIN) 5-325 MG tablet Take 2 tablets by mouth every 4 (four) hours as needed. 07/09/15   Nona Dell, PA-C  ibuprofen (ADVIL,MOTRIN) 800 MG tablet Take 1 tablet (800 mg total) by mouth 3 (three) times daily. 07/09/15   Nona Dell, PA-C   Triage Vitals: BP 157/99 mmHg  Pulse 69  Temp(Src) 98.7 F (37.1 C) (Oral)  Resp 18  SpO2 97% Physical Exam  Constitutional: She is oriented to person, place, and time. She appears well-developed and well-nourished.  HENT:  Head: Normocephalic and atraumatic.  Mouth/Throat: Uvula is midline, oropharynx is clear and moist and mucous  membranes are normal. No trismus in the jaw. Abnormal dentition. Dental caries present. No dental abscesses or uvula swelling. No oropharyngeal exudate, posterior oropharyngeal edema, posterior oropharyngeal erythema or tonsillar abscesses.    Eyes: EOM are normal.  Neck: Normal range of motion.  Cardiovascular: Normal rate, regular rhythm and normal heart sounds.  Exam reveals no gallop and no friction rub.   No murmur heard. 2+ radial and DP pulses. Cap refill less than 2 seconds.  Pulmonary/Chest: Effort normal and breath sounds normal. No respiratory distress. She has no wheezes. She has no rales.  Abdominal: Soft. There is no tenderness.  Musculoskeletal: Normal range of motion. She exhibits tenderness.  No C, T or L midline tenderness. TTP over upper and mid thoracic paraspinal muscles bilaterally with palpable spasms. Decreased ROM of back due to pain. Full ROM of upper and lower extremities.  Lymphadenopathy:    She has no cervical adenopathy.  Neurological:  She is alert and oriented to person, place, and time. She has normal reflexes. She displays normal reflexes.  5/5 strength. Sensations intact. Pt able to stand and ambulate.  Skin: Skin is warm and dry.  Psychiatric: She has a normal mood and affect. Her behavior is normal.  Nursing note and vitals reviewed.   ED Course  .Nerve Block Date/Time: 07/09/2015 4:04 PM Performed by: Nona Dell Authorized by: Nona Dell Consent: Verbal consent obtained. Risks and benefits: risks, benefits and alternatives were discussed Consent given by: patient Patient understanding: patient states understanding of the procedure being performed Patient identity confirmed: verbally with patient Time out: Immediately prior to procedure a "time out" was called to verify the correct patient, procedure, equipment, support staff and site/side marked as required. Indications: pain relief Body area: face/mouth Nerve:  infraorbital Laterality: right Patient position: sitting Needle gauge: 27 G Location technique: anatomical landmarks Local anesthetic: bupivacaine 0.5% with epinephrine Anesthetic total: 1.8 ml Outcome: pain improved Patient tolerance: Patient tolerated the procedure well with no immediate complications   (including critical care time) DIAGNOSTIC STUDIES: Oxygen Saturation is 97% on RA, normal by my interpretation.   COORDINATION OF CARE: 3:40 PM- Will administer dental block and prescribe NSAID and antibiotic. Will order pain medication and muscle relaxer for back pain. Pt verbalizes understanding and agrees to plan.  Medications  bupivacaine-epinephrine (MARCAINE W/ EPI) 0.5% -1:200000 injection 1.8 mL (1.8 mLs Infiltration Given by Other 07/09/15 1552)  diazepam (VALIUM) tablet 5 mg (5 mg Oral Given 07/09/15 1551)  HYDROcodone-acetaminophen (NORCO/VICODIN) 5-325 MG per tablet 1 tablet (1 tablet Oral Given 07/09/15 1551)    Labs Review Labs Reviewed - No data to display  Imaging Review No results found. I have personally reviewed and evaluated these images and lab results as part of my medical decision-making.   MDM   Final diagnoses:  Pain, dental  Bilateral thoracic back pain    Patient with back pain.  No neurological deficits and normal neuro exam.  Patient is ambulatory.  No loss of bowel or bladder control.  No concern for cauda equina.  No fever, night sweats, weight loss, h/o cancer, IVDA, no recent procedure to back. No urinary symptoms suggestive of UTI.  Supportive care and return precaution discussed. Appears safe for discharge at this time. Follow up as indicated in discharge paperwork.   Patient with dentalgia.  No abscess requiring immediate incision and drainage.  Exam not concerning for Ludwig's angina or pharyngeal abscess.  Will treat with Clindamycin. Asian given dental block in the ED without any complications, she reports significant improvement of pain.  Pt instructed to follow-up with dentist.  Discussed return precautions. Pt safe for discharge.  I personally performed the services described in this documentation, which was scribed in my presence. The recorded information has been reviewed and is accurate.     Chesley Noon [place], [place] 07/10/15 1416  Virgel Manifold, MD 07/13/15 [phone]

## 2015-07-20 ENCOUNTER — Ambulatory Visit: Payer: Commercial Managed Care - HMO | Attending: Diagnostic Neuroimaging

## 2015-07-21 ENCOUNTER — Other Ambulatory Visit: Payer: Self-pay | Admitting: *Deleted

## 2015-07-21 MED ORDER — HYDROCHLOROTHIAZIDE 25 MG PO TABS: 25.0000 mg | ORAL_TABLET | Freq: Every day | ORAL | Status: DC

## 2015-08-17 ENCOUNTER — Ambulatory Visit: Payer: Commercial Managed Care - HMO | Admitting: Family Medicine

## 2015-09-06 ENCOUNTER — Encounter: Payer: Self-pay | Admitting: Gastroenterology

## 2015-09-07 ENCOUNTER — Encounter (HOSPITAL_COMMUNITY): Payer: Self-pay | Admitting: Emergency Medicine

## 2015-09-07 ENCOUNTER — Emergency Department (HOSPITAL_COMMUNITY): Payer: Commercial Managed Care - HMO

## 2015-09-07 ENCOUNTER — Emergency Department (HOSPITAL_COMMUNITY)
Admission: EM | Admit: 2015-09-07 | Discharge: 2015-09-07 | Disposition: A | Discharge status: Home / Self Care | Payer: Commercial Managed Care - HMO | Attending: Emergency Medicine | Admitting: Emergency Medicine

## 2015-09-07 ENCOUNTER — Other Ambulatory Visit: Payer: Self-pay | Admitting: *Deleted

## 2015-09-07 DIAGNOSIS — R079 Chest pain, unspecified: Secondary | ICD-10-CM | POA: Diagnosis not present

## 2015-09-07 DIAGNOSIS — Z780 Asymptomatic menopausal state: Secondary | ICD-10-CM | POA: Insufficient documentation

## 2015-09-07 DIAGNOSIS — E669 Obesity, unspecified: Secondary | ICD-10-CM | POA: Diagnosis not present

## 2015-09-07 DIAGNOSIS — Z7951 Long term (current) use of inhaled steroids: Secondary | ICD-10-CM | POA: Diagnosis not present

## 2015-09-07 DIAGNOSIS — R072 Precordial pain: Secondary | ICD-10-CM | POA: Diagnosis not present

## 2015-09-07 DIAGNOSIS — R61 Generalized hyperhidrosis: Secondary | ICD-10-CM | POA: Diagnosis not present

## 2015-09-07 DIAGNOSIS — I10 Essential (primary) hypertension: Secondary | ICD-10-CM | POA: Insufficient documentation

## 2015-09-07 DIAGNOSIS — Z87891 Personal history of nicotine dependence: Secondary | ICD-10-CM | POA: Diagnosis not present

## 2015-09-07 DIAGNOSIS — G629 Polyneuropathy, unspecified: Secondary | ICD-10-CM | POA: Diagnosis not present

## 2015-09-07 DIAGNOSIS — Z79899 Other long term (current) drug therapy: Secondary | ICD-10-CM | POA: Diagnosis not present

## 2015-09-07 DIAGNOSIS — Z880 Allergy status to penicillin: Secondary | ICD-10-CM | POA: Insufficient documentation

## 2015-09-07 DIAGNOSIS — R110 Nausea: Secondary | ICD-10-CM | POA: Insufficient documentation

## 2015-09-07 LAB — BASIC METABOLIC PANEL
Anion gap: 7 (ref 5–15)
BUN: 12 mg/dL (ref 6–20)
CHLORIDE: 103 mmol/L (ref 101–111)
CO2: 29 mmol/L (ref 22–32)
Calcium: 9.2 mg/dL (ref 8.9–10.3)
Creatinine, Ser: 1.17 mg/dL — ABNORMAL HIGH (ref 0.44–1.00)
GFR calc non Af Amer: 52 mL/min — ABNORMAL LOW (ref >60)
Glucose, Bld: 103 mg/dL — ABNORMAL HIGH (ref 65–99)
POTASSIUM: 4.4 mmol/L (ref 3.5–5.1)
SODIUM: 139 mmol/L (ref 135–145)

## 2015-09-07 LAB — CBC
HEMATOCRIT: 42.3 % (ref 36.0–46.0)
Hemoglobin: 14 g/dL (ref 12.0–15.0)
MCH: 27.4 pg (ref 26.0–34.0)
MCHC: 33.1 g/dL (ref 30.0–36.0)
MCV: 82.8 fL (ref 78.0–100.0)
Platelets: 250 10*3/uL (ref 150–400)
RBC: 5.11 MIL/uL (ref 3.87–5.11)
RDW: 14.9 % (ref 11.5–15.5)
WBC: 8 10*3/uL (ref 4.0–10.5)

## 2015-09-07 LAB — I-STAT TROPONIN, ED
TROPONIN I, POC: 0 ng/mL (ref 0.00–0.08)
Troponin i, poc: 0 ng/mL (ref 0.00–0.08)

## 2015-09-07 MED ORDER — ASPIRIN 81 MG PO CHEW
324.0000 mg | CHEWABLE_TABLET | Freq: Once | ORAL | Status: AC
  Administered 2015-09-07: 324 mg via ORAL
  Filled 2015-09-07: qty 4

## 2015-09-07 MED ORDER — HYDROCHLOROTHIAZIDE 25 MG PO TABS: 25.0000 mg | ORAL_TABLET | Freq: Every day | ORAL | Status: DC

## 2015-09-07 MED ORDER — ASPIRIN 81 MG PO CHEW: 81.0000 mg | CHEWABLE_TABLET | Freq: Every day | ORAL | Status: DC

## 2015-09-07 NOTE — ED Notes (Signed)
Pt states she started having sharp stabbing chest pain that started about midnight that started while she was trying to sleep  Pt states today she has numbness in her right arm and has had nausea

## 2015-09-07 NOTE — ED Provider Notes (Signed)
CSN: [id number]     Arrival date & time 09/07/15  1907 History   First MD Initiated Contact with Patient 09/07/15 2049     Chief Complaint  Patient presents with  . Chest Pain     (Consider location/radiation/quality/duration/timing/severity/associated sxs/prior Treatment) Patient is a 55 y.o. female presenting with chest pain. The history is provided by the patient.  Chest Pain Pain location:  Substernal area Pain quality: pressure and sharp   Pain radiates to:  Does not radiate Pain radiates to the back: no   Pain severity:  Mild Onset quality:  Gradual Duration:  1 day Timing:  Constant Progression:  Waxing and waning Chronicity:  New Context: at rest   Relieved by:  Nothing Worsened by:  Nothing tried Ineffective treatments:  None tried Associated symptoms: diaphoresis and nausea   Associated symptoms: no abdominal pain, no fever and no shortness of breath   Associated symptoms comment:  Tingling of right shoulder Risk factors: high cholesterol, hypertension and obesity     Past Medical History  Diagnosis Date  . Hyperlipidemia   . Hypertension   . Back pain     Seen by Neurosurery  . Peripheral neuropathy ([place])   . Allergy   . Obesity   . Post-menopausal   . Leg pain     Bil   Past Surgical History  Procedure Laterality Date  . Laminectomy  01/20/2008    L4-L5 by Dr Latanya Maudlin (Ortho), [facility]  . Tubal ligation    . Carpel tunnel release     Family History  Problem Relation Age of Onset  . Lupus Mother   . Heart disease Mother   . Anemia Father    Social History  Substance Use Topics  . Smoking status: Former Research scientist (life sciences)  . Smokeless tobacco: Never Used     Comment: Quit 25 years ago.  . Alcohol Use: No     Comment: Only socially - maybe two times per year.   OB History    No data available     Review of Systems  Constitutional: Positive for diaphoresis. Negative for fever.  Respiratory: Negative for shortness of breath.     Cardiovascular: Positive for chest pain.  Gastrointestinal: Positive for nausea. Negative for abdominal pain.  All other systems reviewed and are negative.     Allergies  Penicillins and Amoxicillin  Home Medications   Prior to Admission medications   Medication Sig Start Date End Date Taking? Authorizing Provider  carvedilol (COREG) 25 MG tablet Take 25 mg by mouth 2 (two) times daily with a meal.  09/07/15  Yes Historical Provider, MD  cetirizine (ZYRTEC) 10 MG tablet Take 1 tablet (10 mg total) by mouth daily. Patient taking differently: Take 10 mg by mouth daily as needed for allergies.  02/21/15  Yes Archie Patten, MD  fluticasone (FLONASE) 50 MCG/ACT nasal spray Place 2 sprays into both nostrils daily. 02/21/15  Yes Archie Patten, MD  gabapentin (NEURONTIN) 300 MG capsule Take 2 capsules (600 mg total) by mouth 3 (three) times daily. 03/23/15  Yes Archie Patten, MD  hydrochlorothiazide (HYDRODIURIL) 25 MG tablet Take 1 tablet (25 mg total) by mouth daily. 09/07/15  Yes Aquilla Hacker, MD  HYDROcodone-acetaminophen (NORCO/VICODIN) 5-325 MG tablet Take 2 tablets by mouth every 4 (four) hours as needed. 07/09/15  Yes Chesley Noon Nadeau, PA-C  losartan (COZAAR) 50 MG tablet Take 1 tablet (50 mg total) by mouth daily. 02/21/15  Yes Archie Patten, MD  montelukast (SINGULAIR) 10 MG tablet Take 1 tablet (10 mg total) by mouth at bedtime. Patient taking differently: Take 10 mg by mouth daily.  07/19/14  Yes Archie Patten, MD  albuterol (PROVENTIL HFA;VENTOLIN HFA) 108 (90 BASE) MCG/ACT inhaler Inhale 1-2 puffs into the lungs every 6 (six) hours as needed for wheezing or shortness of breath. 05/30/14   Archie Patten, MD  clindamycin (CLEOCIN) 150 MG capsule Take 2 capsules (300 mg total) by mouth 3 (three) times daily. May dispense as 150mg  capsules Patient not taking: Reported on 09/07/2015 07/09/15   Nona Dell, PA-C  cyclobenzaprine (FLEXERIL) 10 MG tablet Take 1  tablet (10 mg total) by mouth at bedtime as needed for muscle spasms. 03/23/15   Archie Patten, MD  diazepam (VALIUM) 5 MG tablet Take 1 tablet (5 mg total) by mouth 2 (two) times daily. Patient not taking: Reported on 09/07/2015 07/09/15   Nona Dell, PA-C  diclofenac (VOLTAREN) 75 MG EC tablet Take 1 tablet (75 mg total) by mouth 2 (two) times daily. Patient not taking: Reported on 07/09/2015 04/06/15   Patrecia Pour, MD  ibuprofen (ADVIL,MOTRIN) 800 MG tablet Take 1 tablet (800 mg total) by mouth 3 (three) times daily. Patient not taking: Reported on 09/07/2015 07/09/15   Nona Dell, PA-C  pantoprazole (PROTONIX) 20 MG tablet Take 1 tablet (20 mg total) by mouth daily. Patient not taking: Reported on 09/07/2015 07/19/14   Archie Patten, MD   BP 150/97 mmHg  Pulse 66  Temp(Src) 98.2 F (36.8 C) (Oral)  Resp 17  Ht 5\' 3"  (1.6 m)  Wt 195 lb (88.451 kg)  BMI 34.55 kg/m2  SpO2 99% Physical Exam  Constitutional: She is oriented to person, place, and time. She appears well-developed and well-nourished. No distress.  HENT:  Head: Normocephalic.  Eyes: Conjunctivae are normal.  Neck: Neck supple. No tracheal deviation present.  Cardiovascular: Normal rate, regular rhythm and normal heart sounds.   Pulmonary/Chest: Effort normal and breath sounds normal. No respiratory distress. She exhibits tenderness (on left sternal border).    Abdominal: Soft. She exhibits no distension.  Neurological: She is alert and oriented to person, place, and time.  Skin: Skin is warm and dry.  Psychiatric: She has a normal mood and affect.    ED Course  Procedures (including critical care time) Labs Review Labs Reviewed  BASIC METABOLIC PANEL - Abnormal; Notable for the following:    Glucose, Bld 103 (*)    Creatinine, Ser 1.17 (*)    GFR calc non Af Amer 52 (*)    All other components within normal limits  CBC  I-STAT TROPOININ, ED    Imaging Review Dg Chest 2  View  09/07/2015  CLINICAL DATA:  Stabbing chest pain beginning at midnight last night while sleeping. Initial encounter. No known injury. EXAM: CHEST  2 VIEW COMPARISON:  PA and lateral chest 05/23/2014 and 01/05/2013. FINDINGS: The lungs are clear. Heart size is normal. There is no pneumothorax or pleural effusion. Thoracic spondylosis is noted. Note focal bony abnormality. IMPRESSION: No acute disease. Electronically Signed   By: Inge Rise M.D.   On: 09/07/2015 19:43   I have personally reviewed and evaluated these images and lab results as part of my medical decision-making.   EKG Interpretation   Date/Time:  Thursday September 07 2015 19:17:54 EDT Ventricular Rate:  69 PR Interval:  171 QRS Duration: 83 QT Interval:  409 QTC Calculation: 438 R Axis:  17 Text Interpretation:  Sinus rhythm Probable left atrial enlargement  Baseline wander in lead(s) II III aVF V1 V2 V3 V4 V5 V6 Otherwise no  significant change Confirmed by Tanji Storrs MD, Lucious Zou [phone]) on 09/07/2015  9:05:31 PM      EKG Interpretation  Date/Time:  Thursday September 07 2015 21:30:26 EDT Ventricular Rate:  60 PR Interval:  178 QRS Duration: 87 QT Interval:  441 QTC Calculation: 441 R Axis:   49 Text Interpretation:  Sinus rhythm Normal ECG Confirmed by Chauna Osoria MD, Collis Thede [id number]) on 09/07/2015 11:15:45 PM        MDM   Final diagnoses:  Chest pain, unspecified chest pain type    55 y.o. female presents with chest pain starting last night. Pressure-like but also like pins and needles. Heart score is 4 with 3 major risk factors but completely normal ECG and negative trop. Will recommend cardiology follow up to consider provocative testing but delta trop negative and otherwise well appearing, no signs of ACS currently. Plan to follow up with PCP as needed and return precautions discussed for worsening or new concerning symptoms.     Leo Grosser, MD 09/08/15 [phone]

## 2015-09-07 NOTE — Discharge Instructions (Signed)
Nonspecific Chest Pain  °Chest pain can be caused by many different conditions. There is always a chance that your pain could be related to something serious, such as a heart attack or a blood clot in your lungs. Chest pain can also be caused by conditions that are not life-threatening. If you have chest pain, it is very important to follow up with your health care provider. °CAUSES  °Chest pain can be caused by: °· Heartburn. °· Pneumonia or bronchitis. °· Anxiety or stress. °· Inflammation around your heart (pericarditis) or lung (pleuritis or pleurisy). °· A blood clot in your lung. °· A collapsed lung (pneumothorax). It can develop suddenly on its own (spontaneous pneumothorax) or from trauma to the chest. °· Shingles infection (varicella-zoster virus). °· Heart attack. °· Damage to the bones, muscles, and cartilage that make up your chest wall. This can include: °¨ Bruised bones due to injury. °¨ Strained muscles or cartilage due to frequent or repeated coughing or overwork. °¨ Fracture to one or more ribs. °¨ Sore cartilage due to inflammation (costochondritis). °RISK FACTORS  °Risk factors for chest pain may include: °· Activities that increase your risk for trauma or injury to your chest. °· Respiratory infections or conditions that cause frequent coughing. °· Medical conditions or overeating that can cause heartburn. °· Heart disease or family history of heart disease. °· Conditions or health behaviors that increase your risk of developing a blood clot. °· Having had chicken pox (varicella zoster). °SIGNS AND SYMPTOMS °Chest pain can feel like: °· Burning or tingling on the surface of your chest or deep in your chest. °· Crushing, pressure, aching, or squeezing pain. °· Dull or sharp pain that is worse when you move, cough, or take a deep breath. °· Pain that is also felt in your back, neck, shoulder, or arm, or pain that spreads to any of these areas. °Your chest pain may come and go, or it may stay  constant. °DIAGNOSIS °Lab tests or other studies may be needed to find the cause of your pain. Your health care provider may have you take a test called an ambulatory ECG (electrocardiogram). An ECG records your heartbeat patterns at the time the test is performed. You may also have other tests, such as: °· Transthoracic echocardiogram (TTE). During echocardiography, sound waves are used to create a picture of all of the heart structures and to look at how blood flows through your heart. °· Transesophageal echocardiogram (TEE). This is a more advanced imaging test that obtains images from inside your body. It allows your health care provider to see your heart in finer detail. °· Cardiac monitoring. This allows your health care provider to monitor your heart rate and rhythm in real time. °· Holter monitor. This is a portable device that records your heartbeat and can help to diagnose abnormal heartbeats. It allows your health care provider to track your heart activity for several days, if needed. °· Stress tests. These can be done through exercise or by taking medicine that makes your heart beat more quickly. °· Blood tests. °· Imaging tests. °TREATMENT  °Your treatment depends on what is causing your chest pain. Treatment may include: °· Medicines. These may include: °¨ Acid blockers for heartburn. °¨ Anti-inflammatory medicine. °¨ Pain medicine for inflammatory conditions. °¨ Antibiotic medicine, if an infection is present. °¨ Medicines to dissolve blood clots. °¨ Medicines to treat coronary artery disease. °· Supportive care for conditions that do not require medicines. This may include: °¨ Resting. °¨ Applying heat   or cold packs to injured areas. °¨ Limiting activities until pain decreases. °HOME CARE INSTRUCTIONS °· If you were prescribed an antibiotic medicine, finish it all even if you start to feel better. °· Avoid any activities that bring on chest pain. °· Do not use any tobacco products, including  cigarettes, chewing tobacco, or electronic cigarettes. If you need help quitting, ask your health care provider. °· Do not drink alcohol. °· Take medicines only as directed by your health care provider. °· Keep all follow-up visits as directed by your health care provider. This is important. This includes any further testing if your chest pain does not go away. °· If heartburn is the cause for your chest pain, you may be told to keep your head raised (elevated) while sleeping. This reduces the chance that acid will go from your stomach into your esophagus. °· Make lifestyle changes as directed by your health care provider. These may include: °¨ Getting regular exercise. Ask your health care provider to suggest some activities that are safe for you. °¨ Eating a heart-healthy diet. A registered dietitian can help you to learn healthy eating options. °¨ Maintaining a healthy weight. °¨ Managing diabetes, if necessary. °¨ Reducing stress. °SEEK MEDICAL CARE IF: °· Your chest pain does not go away after treatment. °· You have a rash with blisters on your chest. °· You have a fever. °SEEK IMMEDIATE MEDICAL CARE IF:  °· Your chest pain is worse. °· You have an increasing cough, or you cough up blood. °· You have severe abdominal pain. °· You have severe weakness. °· You faint. °· You have chills. °· You have sudden, unexplained chest discomfort. °· You have sudden, unexplained discomfort in your arms, back, neck, or jaw. °· You have shortness of breath at any time. °· You suddenly start to sweat, or your skin gets clammy. °· You feel nauseous or you vomit. °· You suddenly feel light-headed or dizzy. °· Your heart begins to beat quickly, or it feels like it is skipping beats. °These symptoms may represent a serious problem that is an emergency. Do not wait to see if the symptoms will go away. Get medical help right away. Call your local emergency services (911 in the [place] Do not drive yourself to the hospital. °  °This  information is not intended to replace advice given to you by your health care provider. Make sure you discuss any questions you have with your health care provider. °  °Document Released: 02/13/2005 Document Revised: 05/27/2014 Document Reviewed: 12/10/2013 °Elsevier Interactive Patient Education ©2016 [employer] ° °

## 2015-09-11 ENCOUNTER — Ambulatory Visit: Payer: Commercial Managed Care - HMO | Admitting: Family Medicine

## 2015-09-25 ENCOUNTER — Other Ambulatory Visit: Payer: Self-pay | Admitting: *Deleted

## 2015-09-25 MED ORDER — CYCLOBENZAPRINE HCL 10 MG PO TABS: 10.0000 mg | ORAL_TABLET | Freq: Every evening | ORAL | Status: DC | PRN

## 2015-10-10 ENCOUNTER — Encounter: Payer: Self-pay | Admitting: Gastroenterology

## 2015-10-24 ENCOUNTER — Ambulatory Visit (INDEPENDENT_AMBULATORY_CARE_PROVIDER_SITE_OTHER): Payer: Commercial Managed Care - HMO | Admitting: Family Medicine

## 2015-10-24 ENCOUNTER — Encounter: Payer: Self-pay | Admitting: Family Medicine

## 2015-10-24 VITALS — BP 170/104 | HR 72 | Temp 98.0°F | Ht 63.0 in | Wt 228.0 lb

## 2015-10-24 DIAGNOSIS — R7303 Prediabetes: Secondary | ICD-10-CM

## 2015-10-24 DIAGNOSIS — M545 Low back pain: Secondary | ICD-10-CM

## 2015-10-24 DIAGNOSIS — M5440 Lumbago with sciatica, unspecified side: Secondary | ICD-10-CM

## 2015-10-24 DIAGNOSIS — M5441 Lumbago with sciatica, right side: Secondary | ICD-10-CM

## 2015-10-24 DIAGNOSIS — G4733 Obstructive sleep apnea (adult) (pediatric): Secondary | ICD-10-CM

## 2015-10-24 DIAGNOSIS — M5430 Sciatica, unspecified side: Secondary | ICD-10-CM

## 2015-10-24 DIAGNOSIS — M5442 Lumbago with sciatica, left side: Secondary | ICD-10-CM

## 2015-10-24 MED ORDER — KETOROLAC TROMETHAMINE 30 MG/ML IJ SOLN
30.0000 mg | Freq: Once | INTRAMUSCULAR | Status: AC
  Administered 2015-10-24: 30 mg via INTRAMUSCULAR

## 2015-10-24 MED ORDER — METHYLPREDNISOLONE ACETATE 40 MG/ML IJ SUSP: 40.0000 mg | Freq: Once | INTRAMUSCULAR | Status: DC

## 2015-10-24 MED ORDER — METHYLPREDNISOLONE ACETATE 40 MG/ML IJ SUSP
40.0000 mg | Freq: Once | INTRAMUSCULAR | Status: AC
  Administered 2015-10-24: 40 mg via INTRAMUSCULAR

## 2015-10-24 MED ORDER — KETOROLAC TROMETHAMINE 30 MG/ML IJ SOLN
30.0000 mg | Freq: Once | INTRAMUSCULAR | Status: DC
Start: 2015-10-24 — End: 2015-10-24

## 2015-10-24 NOTE — Patient Instructions (Signed)
You have already been referred to PT by the neurologist, please follow up with that. I have referred you to nutrition and sleep studies  Try not to take medication too often for headaches, as you can get a rebound headache  Tension Headache A tension headache is a feeling of pain, pressure, or aching that is often felt over the front and sides of the head. The pain can be dull, or it can feel tight (constricting). Tension headaches are not normally associated with nausea or vomiting, and they do not get worse with physical activity. Tension headaches can last from 30 minutes to several days. This is the most common type of headache. CAUSES The exact cause of this condition is not known. Tension headaches often begin after stress, anxiety, or depression. Other triggers may include:  Alcohol.  Too much caffeine, or caffeine withdrawal.  Respiratory infections, such as colds, flu, or sinus infections.  Dental problems or teeth clenching.  Fatigue.  Holding your head and neck in the same position for a long period of time, such as while using a computer.  Smoking. SYMPTOMS Symptoms of this condition include:  A feeling of pressure around the head.  Dull, aching head pain.  Pain felt over the front and sides of the head.  Tenderness in the muscles of the head, neck, and shoulders. DIAGNOSIS This condition may be diagnosed based on your symptoms and a physical exam. Tests may be done, such as a CT scan or an MRI of your head. These tests may be done if your symptoms are severe or unusual. TREATMENT This condition may be treated with lifestyle changes and medicines to help relieve symptoms. HOME CARE INSTRUCTIONS Managing Pain  Take over-the-counter and prescription medicines only as told by your health care provider.  Lie down in a dark, quiet room when you have a headache.  If directed, apply ice to the head and neck area:  Put ice in a plastic bag.  Place a towel between  your skin and the bag.  Leave the ice on for 20 minutes, 2-3 times per day.  Use a heating pad or a hot shower to apply heat to the head and neck area as told by your health care provider. Eating and Drinking  Eat meals on a regular schedule.  Limit alcohol use.  Decrease your caffeine intake, or stop using caffeine. General Instructions  Keep all follow-up visits as told by your health care provider. This is important.  Keep a headache journal to help find out what may trigger your headaches. For example, write down:  What you eat and drink.  How much sleep you get.  Any change to your diet or medicines.  Try massage or other relaxation techniques.  Limit stress.  Sit up straight, and avoid tensing your muscles.  Do not use tobacco products, including cigarettes, chewing tobacco, or e-cigarettes. If you need help quitting, ask your health care provider.  Exercise regularly as told by your health care provider.  Get 7-9 hours of sleep, or the amount recommended by your health care provider. SEEK MEDICAL CARE IF:  Your symptoms are not helped by medicine.  You have a headache that is different from what you normally experience.  You have nausea or you vomit.  You have a fever. SEEK IMMEDIATE MEDICAL CARE IF:  Your headache becomes severe.  You have repeated vomiting.  You have a stiff neck.  You have a loss of vision.  You have problems with speech.  You have pain in your eye or ear.  You have muscular weakness or loss of muscle control.  You lose your balance or you have trouble walking.  You feel faint or you pass out.  You have confusion.   This information is not intended to replace advice given to you by your health care provider. Make sure you discuss any questions you have with your health care provider.   Document Released: 05/06/2005 Document Revised: 01/25/2015 Document Reviewed: 08/29/2014 Elsevier Interactive Patient Education [employer]

## 2015-10-24 NOTE — Assessment & Plan Note (Addendum)
Patient continues to have lumbar back pain. I discussed that she is in difficult situation given she does not prefer surgical intervention at this time.  I provided her a dose of Toradol here in the clinic as well as an injection of Depo-Medrol 40 mg IM. The patient is to continue taking her gabapentin and NSAIDs. I advised her to follow-up with physical therapy as it has been several years since she last attended. I also discussed the importance of abstaining from recreational drug use.

## 2015-10-24 NOTE — Progress Notes (Signed)
Subjective: CC: medication refill HPI: Patient is a 55 y.o. female with a past medical history of lumbar back pain and HTN presenting to clinic today for headaches, chronic lumbar pain, concerns for lung issues, and requesting diet pills among other things.  HEADACHE  Headache started 3-4 months Pain is constant, she notes it will go away with Aleve, then comes back after 1 hr.    Severity:  Mild  Location: in a head band distribution and then into her neck and shoulders  Medications tried: Tylenol didn't help at all Head trauma: no  Sudden onset: no  Previous similar headaches: no  Taking blood thinners: no  History of cancer: no   Symptoms Nose congestion stuffiness:  No  Nausea vomiting: no  Photophobia: no Noise sensitivity: yes Double vision or loss of vision: no Fever: no  Neck Stiffness: yes  Trouble walking or speaking: no   Patient thinks cause of headache might be: unsure    Lung issues: The patient is worried about her lungs. She notes that sometimes she wakes up gasping for air. She is not sure if she snores but believes that she may. She wakes up with headaches sometimes in the morning. She denies a sore throat in the mornings. She has stable two-pillow orthopnea. We discussed OSA is a possibility, she notes that she has been diagnosed with this in the past, but is not sure this is what it is. She also notes that she did not wear a seat Pap due to the large facemask.  Additionally, patient notes significant continued pain in her lumbar region. She saw neurology who suggested surgery, however she did not want to proceed with surgical intervention. Neurology referred her to physical therapy and pain management. She went to physical therapy several years ago and noted that it made her pain worse. She has not tried to go to physical therapy or pain management in the recent past. She did discuss with me today that her pain was so severe that she smoked marijuana. Denies  using recreational drugs regularly.   Prior to leaving, the patient asked for a diet pill. She notes that she continues to gain weight. She has not tried to implement any dietary or lifestyle changes.   Social History: former smoker  ROS: All other systems reviewed and are negative besides that noted in HPI.  Past Medical History Patient Active Problem List   Diagnosis Date Noted  . Dizziness 02/21/2015  . 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 Myalgia 12/18/2014  . Left ankle pain 09/06/2014  . Toe pain 09/06/2014  . Routine adult health maintenance 07/20/2014  . Hamstring injury 12/22/2013  . Left shoulder pain 09/29/2013  . Bilateral edema of lower extremity 09/29/2013  . Breast pain 01/07/2013  . Chest pain, atypical 08/28/2012  . New onset of headaches 08/05/2012  . Neck pain on left side 12/26/2011  . Obstructive sleep apnea 12/26/2011  . Fatigue 09/13/2011  . Prediabetes 07/18/2011  . Back pain of lumbar region with sciatica 04/26/2010  . Morbid obesity ([place]) 04/16/2010  . 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 GAIT DISTURBANCE 05/03/2009  . Allergic rhinitis 09/15/2008  . MENOPAUSAL SYNDROME 09/05/2008  . Hyperlipemia 06/12/2007  . HYPERTENSION, BENIGN SYSTEMIC 07/17/2006    Medications- reviewed and updated Current Outpatient Prescriptions  Medication Sig Dispense Refill  . albuterol (PROVENTIL HFA;VENTOLIN HFA) 108 (90 BASE) MCG/ACT inhaler Inhale 1-2 puffs into the lungs every 6 (six) hours as needed for wheezing or shortness of breath.  1 Inhaler 3  . aspirin 81 MG chewable tablet Chew 1 tablet (81 mg total) by mouth daily. 10 tablet 0  . carvedilol (COREG) 25 MG tablet Take 25 mg by mouth 2 (two) times daily with a meal.     . cetirizine (ZYRTEC) 10 MG tablet Take 1 tablet (10 mg total) by mouth daily. (Patient taking differently: Take 10 mg by mouth daily as needed for allergies. ) 30 tablet 11  . clindamycin (CLEOCIN) 150 MG capsule Take 2  capsules (300 mg total) by mouth 3 (three) times daily. May dispense as 150mg  capsules (Patient not taking: Reported on 09/07/2015) 60 capsule 0  . cyclobenzaprine (FLEXERIL) 10 MG tablet Take 1 tablet (10 mg total) by mouth at bedtime as needed for muscle spasms. 30 tablet 0  . diazepam (VALIUM) 5 MG tablet Take 1 tablet (5 mg total) by mouth 2 (two) times daily. (Patient not taking: Reported on 09/07/2015) 10 tablet 0  . diclofenac (VOLTAREN) 75 MG EC tablet Take 1 tablet (75 mg total) by mouth 2 (two) times daily. (Patient not taking: Reported on 07/09/2015) 60 tablet 1  . fluticasone (FLONASE) 50 MCG/ACT nasal spray Place 2 sprays into both nostrils daily. 16 g 6  . gabapentin (NEURONTIN) 300 MG capsule Take 2 capsules (600 mg total) by mouth 3 (three) times daily. 180 capsule 1  . hydrochlorothiazide (HYDRODIURIL) 25 MG tablet Take 1 tablet (25 mg total) by mouth daily. 30 tablet 3  . HYDROcodone-acetaminophen (NORCO/VICODIN) 5-325 MG tablet Take 2 tablets by mouth every 4 (four) hours as needed. 6 tablet 0  . ibuprofen (ADVIL,MOTRIN) 800 MG tablet Take 1 tablet (800 mg total) by mouth 3 (three) times daily. (Patient not taking: Reported on 09/07/2015) 21 tablet 0  . losartan (COZAAR) 50 MG tablet Take 1 tablet (50 mg total) by mouth daily. 30 tablet 1  . montelukast (SINGULAIR) 10 MG tablet Take 1 tablet (10 mg total) by mouth at bedtime. (Patient taking differently: Take 10 mg by mouth daily. ) 30 tablet 3  . pantoprazole (PROTONIX) 20 MG tablet Take 1 tablet (20 mg total) by mouth daily. (Patient not taking: Reported on 09/07/2015) 20 tablet 30   No current facility-administered medications for this visit.    Objective: Office vital signs reviewed. BP 170/104 mmHg  Pulse 72  Temp(Src) 98 F (36.7 C) (Oral)  Ht 5\' 3"  (1.6 m)  Wt 228 lb (103.42 kg)  BMI 40.40 kg/m2   Physical Examination:  General: Awake, alert, well- nourished, NAD Cardio: RRR, no m/r/g noted.  Pulm: No increased WOB.   CTAB, without wheezes, rhonchi or crackles noted.  Back: Tenderness to palpation over the lumbar spine. Tenderness to palpation over the trapezius muscles bilaterally. No visible abnormality. Patient endorses pain with both flexion and extension of her lumbar spine. Negative straight leg raise. Weakness in hip flexion and extension bilaterally, but symmetric. Equal DTRs Neuro: A&O x4. Speech clear. EOMI, PERRLA, normal funduscopic exam. Uvula and tongue midline. Facial movements symmetric.  Strength symmetric in the upper extremities and lower extremities bilaterally. Sensation intact bilaterally.    Assessment/Plan: Obstructive sleep apnea I suspect the patient's symptoms are secondary to OSA, however given her reportedly feeling different now than previously, will refer back to sleep study.  Back pain of lumbar region with sciatica Patient continues to have lumbar back pain. I discussed that she is in difficult situation given she does not prefer surgical intervention at this time.  I provided her a dose of  Toradol here in the clinic as well as an injection of Depo-Medrol 40 mg IM. The patient is to continue taking her gabapentin and NSAIDs. I advised her to follow-up with physical therapy as it has been several years since she last attended. I also discussed the importance of abstaining from recreational drug use.  Headache: The patient's symptoms are consistent with a tension headache. Discussed management appropriately. Discussed the importance of not taking NSAIDs or other analgesics regularly as she can have rebound headaches as well. No focal neurologic deficits found on exam. She stated as of Toradol and Depo-Medrol today in clinic, which should help some.  Orders Placed This Encounter  Procedures  . Ambulatory referral to Nutrition and Diabetic Education    Referral Priority:  Routine    Referral Type:  Consultation    Referral Reason:  Specialty Services Required    Number of Visits  Requested:  1  . Ambulatory referral to Sleep Studies    Referral Priority:  Routine    Referral Type:  Consultation    Referral Reason:  Specialty Services Required    Number of Visits Requested:  1    Meds ordered this encounter  Medications  . DISCONTD: methylPREDNISolone acetate (DEPO-MEDROL) injection 40 mg    Sig:   . DISCONTD: ketorolac (TORADOL) 30 MG/ML injection 30 mg    Sig:   . ketorolac (TORADOL) 30 MG/ML injection 30 mg    Sig:   . methylPREDNISolone acetate (DEPO-MEDROL) injection 40 mg    Sig:     Archie Patten PGY-2, [place]

## 2015-10-24 NOTE — Assessment & Plan Note (Signed)
I suspect the patient's symptoms are secondary to OSA, however given her reportedly feeling different now than previously, will refer back to sleep study.

## 2015-11-22 ENCOUNTER — Ambulatory Visit (AMBULATORY_SURGERY_CENTER): Payer: Self-pay

## 2015-11-22 VITALS — Ht 63.0 in | Wt 226.0 lb

## 2015-11-22 DIAGNOSIS — Z860100 Personal history of colon polyps, unspecified: Secondary | ICD-10-CM

## 2015-11-22 NOTE — Progress Notes (Signed)
No allergies to eggs or soy No past problems with anesthesia No diet meds No home oxygen  Has email and internet registered for emmi 

## 2015-11-24 ENCOUNTER — Other Ambulatory Visit: Payer: Self-pay | Admitting: *Deleted

## 2015-11-24 DIAGNOSIS — I10 Essential (primary) hypertension: Secondary | ICD-10-CM

## 2015-11-24 MED ORDER — LOSARTAN POTASSIUM 50 MG PO TABS
50.0000 mg | ORAL_TABLET | Freq: Every day | ORAL | Status: DC
Start: 2015-11-24 — End: 2016-02-22

## 2015-11-24 MED ORDER — PANTOPRAZOLE SODIUM 20 MG PO TBEC: 20.0000 mg | DELAYED_RELEASE_TABLET | Freq: Every day | ORAL | Status: DC

## 2015-12-06 ENCOUNTER — Encounter: Payer: Commercial Managed Care - HMO | Admitting: Gastroenterology

## 2015-12-19 ENCOUNTER — Encounter: Payer: Self-pay | Admitting: *Deleted

## 2015-12-19 ENCOUNTER — Ambulatory Visit (INDEPENDENT_AMBULATORY_CARE_PROVIDER_SITE_OTHER): Payer: Commercial Managed Care - HMO | Admitting: *Deleted

## 2015-12-19 VITALS — BP 136/83 | HR 85 | Temp 98.3°F | Ht 63.0 in | Wt 221.4 lb

## 2015-12-19 DIAGNOSIS — Z114 Encounter for screening for human immunodeficiency virus [HIV]: Secondary | ICD-10-CM | POA: Diagnosis not present

## 2015-12-19 DIAGNOSIS — Z1159 Encounter for screening for other viral diseases: Secondary | ICD-10-CM | POA: Diagnosis not present

## 2015-12-19 DIAGNOSIS — Z0000 Encounter for general adult medical examination without abnormal findings: Secondary | ICD-10-CM

## 2015-12-19 LAB — HIV ANTIBODY (ROUTINE TESTING W REFLEX): HIV 1&2 Ab, 4th Generation: NONREACTIVE

## 2015-12-19 LAB — HEPATITIS C ANTIBODY: HCV Ab: NEGATIVE

## 2015-12-19 NOTE — Progress Notes (Signed)
Lauren requested a [employer].   Presenting Issue:  Patient scored 21 on PHQ-9, reported being 'moody'  Report of symptoms:  Patient described ups and downs in her mood, sometimes feeling happy but other times being sad and feeling like she has the "blues." Patient reported feeling "worthless" at times and has been feeling more down than usual. She reported that she currently does nothing much during the day, but sometimes watches her grandchildren. Currently, she deals with negative feelings by talking to her daughter about it, who she described as a good social support.   Duration of CURRENT symptoms:  Feeling down more in the past few weeks, not sure why, patient thinks maybe menopause  Age of onset of first mood disturbance:  Patient stated that when she was 69, her parents died about a year apart and she "didn't know how to handle death." She tried to take her own life by taking pills and woke up in the hospital. This is her only suicide attempt and she has had no suicidal ideation since that time. She denied any suicidal ideation or intent over the last 2 weeks.   Impact on function: Patient does not like feeling down/worthless, but reported that things are "okay."  Psychiatric History - Hospitalizations: Hospitalized after suicide attempt in 1983, but she "signed herself out" - Outpatient therapy: None  Current and history of substance use:  Not assessed  PHQ-9:  21 (no thoughts of hurting self)  Assessment / Plan / Recommendations: Patient was open to speaking with [facility] today about strategies to help her mood. She was unsure about counseling referrals and preferred to call [facility] if she would like a followup with a [facility] here. As patient reported that she does little during the day, [facility] discussed pleasant activity scheduling. Patient read over list of pleasant activities and checked off many that she is interested in and able to try. [facility] advised patient to try to do one  pleasant activity for herself every day, even if she does not feel like it, highlighting negative cycle that can occur when people don't feel like doing things and thus do nothing. Patient expressed understanding. [facility] offered to schedule followup appointment to check in, but patient would prefer to call in to request appointment if she finds herself needing extra support.

## 2015-12-19 NOTE — Patient Instructions (Signed)
 Fall Prevention in the Home  Falls can cause injuries. They can happen to people of all ages. There are many things you can do to make your home safe and to help prevent falls.  WHAT CAN I DO ON THE OUTSIDE OF MY HOME?  Regularly fix the edges of walkways and driveways and fix any cracks.  Remove anything that might make you trip as you walk through a door, such as a raised step or threshold.  Trim any bushes or trees on the path to your home.  Use bright outdoor lighting.  Clear any walking paths of anything that might make someone trip, such as rocks or tools.  Regularly check to see if handrails are loose or broken. Make sure that both sides of any steps have handrails.  Any raised decks and porches should have guardrails on the edges.  Have any leaves, snow, or ice cleared regularly.  Use sand or salt on walking paths during winter.  Clean up any spills in your garage right away. This includes oil or grease spills. WHAT CAN I DO IN THE BATHROOM?   Use night lights.  Install grab bars by the toilet and in the tub and shower. Do not use towel bars as grab bars.  Use non-skid mats or decals in the tub or shower.  If you need to sit down in the shower, use a plastic, non-slip stool.  Keep the floor dry. Clean up any water that spills on the floor as soon as it happens.  Remove soap buildup in the tub or shower regularly.  Attach bath mats securely with double-sided non-slip rug tape.  Do not have throw rugs and other things on the floor that can make you trip. WHAT CAN I DO IN THE BEDROOM?  Use night lights.  Make sure that you have a light by your bed that is easy to reach.  Do not use any sheets or blankets that are too big for your bed. They should not hang down onto the floor.  Have a firm chair that has side arms. You can use this for support while you get dressed.  Do not have throw rugs and other things on the floor that can make you trip. WHAT CAN I DO  IN THE KITCHEN?  Clean up any spills right away.  Avoid walking on wet floors.  Keep items that you use a lot in easy-to-reach places.  If you need to reach something above you, use a strong step stool that has a grab bar.  Keep electrical cords out of the way.  Do not use floor polish or wax that makes floors slippery. If you must use wax, use non-skid floor wax.  Do not have throw rugs and other things on the floor that can make you trip. WHAT CAN I DO WITH MY STAIRS?  Do not leave any items on the stairs.  Make sure that there are handrails on both sides of the stairs and use them. Fix handrails that are broken or loose. Make sure that handrails are as long as the stairways.  Check any carpeting to make sure that it is firmly attached to the stairs. Fix any carpet that is loose or worn.  Avoid having throw rugs at the top or bottom of the stairs. If you do have throw rugs, attach them to the floor with carpet tape.  Make sure that you have a light switch at the top of the stairs and the bottom of the   stairs. If you do not have them, ask someone to add them for you. WHAT ELSE CAN I DO TO HELP PREVENT FALLS?  Wear shoes that:  Do not have high heels.  Have rubber bottoms.  Are comfortable and fit you well.  Are closed at the toe. Do not wear sandals.  If you use a stepladder:  Make sure that it is fully opened. Do not climb a closed stepladder.  Make sure that both sides of the stepladder are locked into place.  Ask someone to hold it for you, if possible.  Clearly mark and make sure that you can see:  Any grab bars or handrails.  First and last steps.  Where the edge of each step is.  Use tools that help you move around (mobility aids) if they are needed. These include:  Canes.  Walkers.  Scooters.  Crutches.  Turn on the lights when you go into a dark area. Replace any light bulbs as soon as they burn out.  Set up your furniture so you have a clear  path. Avoid moving your furniture around.  If any of your floors are uneven, fix them.  If there are any pets around you, be aware of where they are.  Review your medicines with your doctor. Some medicines can make you feel dizzy. This can increase your chance of falling. Ask your doctor what other things that you can do to help prevent falls.   This information is not intended to replace advice given to you by your health care provider. Make sure you discuss any questions you have with your health care provider.   Document Released: 03/02/2009 Document Revised: 09/20/2014 Document Reviewed: 06/10/2014 [employer] Interactive Patient Education 2016 [employer]  Health Maintenance, Female Adopting a healthy lifestyle and getting preventive care can go a long way to promote health and wellness. Talk with your health care provider about what schedule of regular examinations is right for you. This is a good chance for you to check in with your provider about disease prevention and staying healthy. In between checkups, there are plenty of things you can do on your own. Experts have done a lot of research about which lifestyle changes and preventive measures are most likely to keep you healthy. Ask your health care provider for more information. WEIGHT AND DIET  Eat a healthy diet  Be sure to include plenty of vegetables, fruits, low-fat dairy products, and lean protein.  Do not eat a lot of foods high in solid fats, added sugars, or salt.  Get regular exercise. This is one of the most important things you can do for your health.  Most adults should exercise for at least 150 minutes each week. The exercise should increase your heart rate and make you sweat (moderate-intensity exercise).  Most adults should also do strengthening exercises at least twice a week. This is in addition to the moderate-intensity exercise.  Maintain a healthy weight  Body mass index (BMI) is a measurement that can  be used to identify possible weight problems. It estimates body fat based on height and weight. Your health care provider can help determine your BMI and help you achieve or maintain a healthy weight.  For females 20 years of age and older:   A BMI below 18.5 is considered underweight.  A BMI of 18.5 to 24.9 is normal.  A BMI of 25 to 29.9 is considered overweight.  A BMI of 30 and above is considered obese.  Watch levels   of cholesterol and blood lipids  You should start having your blood tested for lipids and cholesterol at 54 years of age, then have this test every 5 years.  You may need to have your cholesterol levels checked more often if:  Your lipid or cholesterol levels are high.  You are older than 53 years of age.  You are at high risk for heart disease.  CANCER SCREENING   Lung Cancer  Lung cancer screening is recommended for adults 53-62 years old who are at high risk for lung cancer because of a history of smoking.  A yearly low-dose CT scan of the lungs is recommended for people who:  Currently smoke.  Have quit within the past 15 years.  Have at least a 30-pack-year history of smoking. A pack year is smoking an average of one pack of cigarettes a day for 1 year.  Yearly screening should continue until it has been 15 years since you quit.  Yearly screening should stop if you develop a health problem that would prevent you from having lung cancer treatment.  Breast Cancer  Practice breast self-awareness. This means understanding how your breasts normally appear and feel.  It also means doing regular breast self-exams. Let your health care provider know about any changes, no matter how small.  If you are in your 20s or 30s, you should have a clinical breast exam (CBE) by a health care provider every 1-3 years as part of a regular health exam.  If you are 4 or older, have a CBE every year. Also consider having a breast X-ray (mammogram) every year.  If  you have a family history of breast cancer, talk to your health care provider about genetic screening.  If you are at high risk for breast cancer, talk to your health care provider about having an MRI and a mammogram every year.  Breast cancer gene (BRCA) assessment is recommended for women who have family members with BRCA-related cancers. BRCA-related cancers include:  Breast.  Ovarian.  Tubal.  Peritoneal cancers.  Results of the assessment will determine the need for genetic counseling and BRCA1 and BRCA2 testing. Cervical Cancer Your health care provider may recommend that you be screened regularly for cancer of the pelvic organs (ovaries, uterus, and vagina). This screening involves a pelvic examination, including checking for microscopic changes to the surface of your cervix (Pap test). You may be encouraged to have this screening done every 3 years, beginning at age 75.  For women ages 5-65, health care providers may recommend pelvic exams and Pap testing every 3 years, or they may recommend the Pap and pelvic exam, combined with testing for human papilloma virus (HPV), every 5 years. Some types of HPV increase your risk of cervical cancer. Testing for HPV may also be done on women of any age with unclear Pap test results.  Other health care providers may not recommend any screening for nonpregnant women who are considered low risk for pelvic cancer and who do not have symptoms. Ask your health care provider if a screening pelvic exam is right for you.  If you have had past treatment for cervical cancer or a condition that could lead to cancer, you need Pap tests and screening for cancer for at least 20 years after your treatment. If Pap tests have been discontinued, your risk factors (such as having a new sexual partner) need to be reassessed to determine if screening should resume. Some women have medical problems that increase the chance  of getting cervical cancer. In these cases,  your health care provider may recommend more frequent screening and Pap tests. Colorectal Cancer  This type of cancer can be detected and often prevented.  Routine colorectal cancer screening usually begins at 54 years of age and continues through 54 years of age.  Your health care provider may recommend screening at an earlier age if you have risk factors for colon cancer.  Your health care provider may also recommend using home test kits to check for hidden blood in the stool.  A small camera at the end of a tube can be used to examine your colon directly (sigmoidoscopy or colonoscopy). This is done to check for the earliest forms of colorectal cancer.  Routine screening usually begins at age 50.  Direct examination of the colon should be repeated every 5-10 years through 54 years of age. However, you may need to be screened more often if early forms of precancerous polyps or small growths are found. Skin Cancer  Check your skin from head to toe regularly.  Tell your health care provider about any new moles or changes in moles, especially if there is a change in a mole's shape or color.  Also tell your health care provider if you have a mole that is larger than the size of a pencil eraser.  Always use sunscreen. Apply sunscreen liberally and repeatedly throughout the day.  Protect yourself by wearing long sleeves, pants, a wide-brimmed hat, and sunglasses whenever you are outside. HEART DISEASE, DIABETES, AND HIGH BLOOD PRESSURE   High blood pressure causes heart disease and increases the risk of stroke. High blood pressure is more likely to develop in:  People who have blood pressure in the high end of the normal range (130-139/85-89 mm Hg).  People who are overweight or obese.  People who are African American.  If you are 18-39 years of age, have your blood pressure checked every 3-5 years. If you are 40 years of age or older, have your blood pressure checked every year. You  should have your blood pressure measured twice--once when you are at a hospital or clinic, and once when you are not at a hospital or clinic. Record the average of the two measurements. To check your blood pressure when you are not at a hospital or clinic, you can use:  An automated blood pressure machine at a pharmacy.  A home blood pressure monitor.  If you are between 55 years and 79 years old, ask your health care provider if you should take aspirin to prevent strokes.  Have regular diabetes screenings. This involves taking a blood sample to check your fasting blood sugar level.  If you are at a normal weight and have a low risk for diabetes, have this test once every three years after 54 years of age.  If you are overweight and have a high risk for diabetes, consider being tested at a younger age or more often. PREVENTING INFECTION  Hepatitis B  If you have a higher risk for hepatitis B, you should be screened for this virus. You are considered at high risk for hepatitis B if:  You were born in a country where hepatitis B is common. Ask your health care provider which countries are considered high risk.  Your parents were born in a high-risk country, and you have not been immunized against hepatitis B (hepatitis B vaccine).  You have HIV or AIDS.  You use needles to inject street drugs.  You   live with someone who has hepatitis B.  You have had sex with someone who has hepatitis B.  You get hemodialysis treatment.  You take certain medicines for conditions, including cancer, organ transplantation, and autoimmune conditions. Hepatitis C  Blood testing is recommended for:  Everyone born from 1945 through 1965.  Anyone with known risk factors for hepatitis C. Sexually transmitted infections (STIs)  You should be screened for sexually transmitted infections (STIs) including gonorrhea and chlamydia if:  You are sexually active and are younger than 54 years of age.  You  are older than 54 years of age and your health care provider tells you that you are at risk for this type of infection.  Your sexual activity has changed since you were last screened and you are at an increased risk for chlamydia or gonorrhea. Ask your health care provider if you are at risk.  If you do not have HIV, but are at risk, it may be recommended that you take a prescription medicine daily to prevent HIV infection. This is called pre-exposure prophylaxis (PrEP). You are considered at risk if:  You are sexually active and do not regularly use condoms or know the HIV status of your partner(s).  You take drugs by injection.  You are sexually active with a partner who has HIV. Talk with your health care provider about whether you are at high risk of being infected with HIV. If you choose to begin PrEP, you should first be tested for HIV. You should then be tested every 3 months for as long as you are taking PrEP.  PREGNANCY   If you are premenopausal and you may become pregnant, ask your health care provider about preconception counseling.  If you may become pregnant, take 400 to 800 micrograms (mcg) of folic acid every day.  If you want to prevent pregnancy, talk to your health care provider about birth control (contraception). OSTEOPOROSIS AND MENOPAUSE   Osteoporosis is a disease in which the bones lose minerals and strength with aging. This can result in serious bone fractures. Your risk for osteoporosis can be identified using a bone density scan.  If you are 65 years of age or older, or if you are at risk for osteoporosis and fractures, ask your health care provider if you should be screened.  Ask your health care provider whether you should take a calcium or vitamin D supplement to lower your risk for osteoporosis.  Menopause may have certain physical symptoms and risks.  Hormone replacement therapy may reduce some of these symptoms and risks. Talk to your health care  provider about whether hormone replacement therapy is right for you.  HOME CARE INSTRUCTIONS   Schedule regular health, dental, and eye exams.  Stay current with your immunizations.   Do not use any tobacco products including cigarettes, chewing tobacco, or electronic cigarettes.  If you are pregnant, do not drink alcohol.  If you are breastfeeding, limit how much and how often you drink alcohol.  Limit alcohol intake to no more than 1 drink per day for nonpregnant women. One drink equals 12 ounces of beer, 5 ounces of wine, or 1 ounces of hard liquor.  Do not use street drugs.  Do not share needles.  Ask your health care provider for help if you need support or information about quitting drugs.  Tell your health care provider if you often feel depressed.  Tell your health care provider if you have ever been abused or do not feel safe   at home.   This information is not intended to replace advice given to you by your health care provider. Make sure you discuss any questions you have with your health care provider.   Document Released: 11/19/2010 Document Revised: 05/27/2014 Document Reviewed: 04/07/2013 [employer] Interactive Patient Education 2016 [employer]  

## 2015-12-19 NOTE — Progress Notes (Signed)
Subjective:   Kristen [name] is a 55 y.o. female who presents for an Initial Medicare Annual Wellness Visit.  Cardiac Risk Factors include: dyslipidemia;hypertension;obesity (BMI >30kg/m2)     Objective:    Today's Vitals   12/19/15 0957  BP: 136/83  Pulse: 85  Temp: 98.3 F (36.8 C)  TempSrc: Oral  SpO2: 96%  Weight: 221 lb 6.4 oz (100.4 kg)  Height: 5\' 3"  (1.6 m)  PainSc: 7    Body mass index is 39.22 kg/m.   Current Medications (verified) Outpatient Encounter Prescriptions as of 12/19/2015  Medication Sig  . albuterol (PROVENTIL HFA;VENTOLIN HFA) 108 (90 BASE) MCG/ACT inhaler Inhale 1-2 puffs into the lungs every 6 (six) hours as needed for wheezing or shortness of breath.  Marland Kitchen aspirin 81 MG chewable tablet Chew 1 tablet (81 mg total) by mouth daily.  . carvedilol (COREG) 25 MG tablet Take 25 mg by mouth 2 (two) times daily with a meal.   . cetirizine (ZYRTEC) 10 MG tablet Take 1 tablet (10 mg total) by mouth daily. (Patient taking differently: Take 10 mg by mouth daily as needed for allergies. )  . cyclobenzaprine (FLEXERIL) 10 MG tablet Take 10 mg by mouth 3 (three) times daily as needed for muscle spasms.  . diclofenac (VOLTAREN) 75 MG EC tablet Take 1 tablet (75 mg total) by mouth 2 (two) times daily.  . fluticasone (FLONASE) 50 MCG/ACT nasal spray Place 2 sprays into both nostrils daily. (Patient taking differently: Place 2 sprays into both nostrils as needed. )  . gabapentin (NEURONTIN) 300 MG capsule Take 2 capsules (600 mg total) by mouth 3 (three) times daily.  . hydrochlorothiazide (HYDRODIURIL) 25 MG tablet Take 1 tablet (25 mg total) by mouth daily.  Marland Kitchen ibuprofen (ADVIL,MOTRIN) 800 MG tablet Take 1 tablet (800 mg total) by mouth 3 (three) times daily.  Marland Kitchen losartan (COZAAR) 50 MG tablet Take 1 tablet (50 mg total) by mouth daily.  . montelukast (SINGULAIR) 10 MG tablet Take 1 tablet (10 mg total) by mouth at bedtime. (Patient taking differently: Take 10 mg by  mouth daily. )  . pantoprazole (PROTONIX) 20 MG tablet Take 1 tablet (20 mg total) by mouth daily.   No facility-administered encounter medications on file as of 12/19/2015.     Allergies (verified) Penicillins and Amoxicillin   History: Past Medical History:  Diagnosis Date  . Allergy   . Back pain    Seen by Neurosurery  . Hyperlipidemia   . Hypertension   . Leg pain    Bil  . Obesity   . Peripheral neuropathy ([place])   . Post-menopausal    Past Surgical History:  Procedure Laterality Date  . carpel tunnel release    . LAMINECTOMY  01/20/2008   L4-L5 by Dr Latanya Maudlin (Ortho), [facility]  . TUBAL LIGATION     Family History  Problem Relation Age of Onset  . Lupus Mother   . Heart disease Mother   . Anemia Father   . Hypertension Father   . Arthritis Brother   . Asthma Son   . Lupus Daughter   . Colon cancer Neg Hx    Social History   Occupational History  . Disabled    Social History Main Topics  . Smoking status: Former Smoker    Packs/day: 0.25    Years: 0.50    Types: Cigarettes    Quit date: 12/19/1990  . Smokeless tobacco: Never Used     Comment: Quit 25  years ago.  . Alcohol use No     Comment: Only socially - maybe two times per year.  . Drug use:     Frequency: 4.0 times per week    Types: Marijuana     Comment: Helps with back pain  . Sexual activity: Yes    Birth control/ protection: Post-menopausal    Tobacco Counseling Counseling given: Yes   Activities of Daily Living In your present state of health, do you have any difficulty performing the following activities: 12/19/2015  Hearing? N  Vision? Y  Difficulty concentrating or making decisions? N  Walking or climbing stairs? Y  Dressing or bathing? N  Doing errands, shopping? N  Preparing Food and eating ? Y  Using the Toilet? N  In the past six months, have you accidently leaked urine? Y  Do you have problems with loss of bowel control? N  Managing your Medications? N    Managing your Finances? N  Housekeeping or managing your Housekeeping? Y  Some recent data might be hidden  Home Safety:  My home has a working smoke alarm:  Yes, times 2           My home throw rugs have been fastened down to the floor or removed:  Removed I have non-slip mats in the bathtub and shower:  Yes       All my home's stairs have railings or bannisters: One outside step with railing         My home's floors, stairs and hallways are free from clutter, wires and cords:  Yes        Immunizations and Health Maintenance Immunization History  Administered Date(s) Administered  . Influenza Split 02/17/2012  . Influenza Whole 06/12/2007, 02/21/2009  . Influenza,inj,Quad PF,36+ Mos 06/22/2013, 02/21/2015  . Td 12/23/2003   Health Maintenance Due  Topic Date Due  . Hepatitis C Screening  09-04-1960  . HIV Screening  09/20/1976  . TETANUS/TDAP  12/22/2013  . COLONOSCOPY  10/06/2015  . INFLUENZA VACCINE  12/19/2015  HIV and Hep C drawn today Patient will receive TDaP at [place] Colonoscopy scheduled at [facility] for 01/17/2016  Patient Care Team: Archie Patten, MD as PCP - General  Indicate any recent Medical Services you may have received from other than [facility] providers in the past year (date may be approximate).     Assessment:   This is a routine wellness examination for Kristen [name].   Hearing/Vision screen  Hearing Screening   125Hz  250Hz  500Hz  1000Hz  2000Hz  3000Hz  4000Hz  6000Hz  8000Hz   Right ear:   25 25 25  25     Left ear:   25 25 25  25       Dietary issues and exercise activities discussed: Current Exercise Habits: Home exercise routine, Type of exercise: walking, Time (Minutes): 30, Frequency (Times/Week): 3, Weekly Exercise (Minutes/Week): 90, Intensity: Mild, Exercise limited by: orthopedic condition(s) (Walks with cane/low back and bilateral leg pain)  Goals    . Blood Pressure < 140/90    . Increase water intake    . Weight (lb) < 205 lb (93 kg)           7% weight loss     Discussed recording consumption intake to assist with desired 7% weight loss. Recommended MyPLate or My Fitness Pal apps to assist with recording.  Depression Screen PHQ 2/9 Scores 12/19/2015 04/06/2015 02/21/2015 01/16/2015 12/22/2014 12/16/2014 09/06/2014  PHQ - 2 Score 5 0 5 0 0 0 0  PHQ- 9  Score 21 - 16 - - - -  Patient taken to [place] consult with "Sonia Baller"  Fall Risk Fall Risk  12/19/2015 04/06/2015 03/23/2015 02/21/2015 06/22/2013  Falls in the past year? Yes Yes Yes Yes No  Number falls in past yr: 2 or more 2 or more - 2 or more -  Injury with Fall? No No - No -  Risk Factor Category  High Fall Risk - - - -  Risk for fall due to : Impaired mobility;Impaired balance/gait;History of fall(s) - - Impaired balance/gait -  Follow up Falls prevention discussed;Education provided - - Falls prevention discussed -   Discussed wearing well fitting shoes and using cane at all times to decrease risk of fall  Cognitive Function: Mini-Cog passed with score 3/5  TUG Test:  Done in 12 seconds. Patient used both hands to push out of chair and to sit back down. Walked with cane  Screening Tests Health Maintenance  Topic Date Due  . Hepatitis C Screening  03/30/1961  . HIV Screening  09/20/1976  . TETANUS/TDAP  12/22/2013  . COLONOSCOPY  10/06/2015  . INFLUENZA VACCINE  12/19/2015  . MAMMOGRAM  03/22/2017  . PAP SMEAR  07/18/2017      Plan:   Patient c/o right arm numbness X 4 weeks and bilateral leg cramps. Encouraged to schedule appt with PCP, Dr. Lorenso Courier, to discuss these concerns.  During the course of the visit, Kristen [name] was educated and counseled about the following appropriate screening and preventive services:   Vaccines to include Pneumoccal, Influenza, Td, Zostavax  Cardiovascular disease screening  Colorectal cancer screening  Diabetes screening  Mammography/PAP  Nutrition counseling  Patient Instructions (the written plan) were  given to the patient.    Velora Heckler, RN   12/19/2015     Physician Addendum:  I have reviewed this visit and discussed with Howell Rucks, RN, BSN, and agree with her documentation. Patient strongly urged to f/u with PCP concerning R arm numbness.   Archie Patten, MD [place] Resident, PGY-3

## 2015-12-20 ENCOUNTER — Telehealth: Payer: Self-pay | Admitting: Family Medicine

## 2015-12-20 NOTE — Telephone Encounter (Signed)
LMOVM for pt to call us back. Deseree Blount, CMA  

## 2015-12-20 NOTE — Telephone Encounter (Signed)
Please let the patient know that both hepatitis C and HIV were negative.  Thanks, Archie Patten, MD [facility] Resident  12/20/2015, 3:04 PM

## 2015-12-21 ENCOUNTER — Telehealth: Payer: Self-pay | Admitting: *Deleted

## 2015-12-21 ENCOUNTER — Other Ambulatory Visit: Payer: Self-pay | Admitting: Family Medicine

## 2015-12-21 NOTE — Telephone Encounter (Signed)
Patient informed, expressed understanding. 

## 2015-12-26 ENCOUNTER — Telehealth: Payer: Self-pay | Admitting: *Deleted

## 2015-12-26 MED ORDER — CARVEDILOL 25 MG PO TABS: 25.0000 mg | ORAL_TABLET | Freq: Two times a day (BID) | ORAL | 0 refills | Status: DC

## 2015-12-26 NOTE — Telephone Encounter (Signed)
Left message for patient to return call to schedule appt with PCP.  Thomes Burak, Orvis Brill, RN  .

## 2015-12-26 NOTE — Telephone Encounter (Signed)
-----   Message from Archie Patten, MD sent at 12/26/2015 12:55 PM EDT ----- Ander Purpura,  Did she have other neurologic symptoms besides the R arm numbness?  I was just trying to figure out if this was something that needed to be handled sooner rather than later.  Thanks, Kathrine Cords ----- Message ----- From: Velora Heckler, RN Sent: 12/19/2015   5:16 PM To: Archie Patten, MD  Dr. Lorenso Courier,  Please review the Medications, Problem List, Past Medical and Surgical Histories as well as Family and Social Histories.  Then write addendum:  I have reviewed this visit and discussed with Howell Rucks, RN, BSN, and agree with her documentation.  Lastly, please close the encounter.  Thank you!   Ander Purpura

## 2016-01-02 ENCOUNTER — Ambulatory Visit: Payer: Commercial Managed Care - HMO | Admitting: Internal Medicine

## 2016-01-03 ENCOUNTER — Ambulatory Visit (INDEPENDENT_AMBULATORY_CARE_PROVIDER_SITE_OTHER): Payer: Commercial Managed Care - HMO | Admitting: Internal Medicine

## 2016-01-03 ENCOUNTER — Telehealth: Payer: Self-pay | Admitting: Family Medicine

## 2016-01-03 ENCOUNTER — Encounter: Payer: Self-pay | Admitting: Internal Medicine

## 2016-01-03 VITALS — BP 143/84 | HR 105 | Temp 98.2°F | Wt 222.0 lb

## 2016-01-03 DIAGNOSIS — A599 Trichomoniasis, unspecified: Secondary | ICD-10-CM | POA: Diagnosis not present

## 2016-01-03 DIAGNOSIS — R61 Generalized hyperhidrosis: Secondary | ICD-10-CM

## 2016-01-03 DIAGNOSIS — L74519 Primary focal hyperhidrosis, unspecified: Secondary | ICD-10-CM | POA: Diagnosis not present

## 2016-01-03 DIAGNOSIS — R300 Dysuria: Secondary | ICD-10-CM | POA: Diagnosis not present

## 2016-01-03 DIAGNOSIS — I10 Essential (primary) hypertension: Secondary | ICD-10-CM

## 2016-01-03 LAB — POCT URINALYSIS DIPSTICK
BILIRUBIN UA: NEGATIVE
GLUCOSE UA: NEGATIVE
NITRITE UA: NEGATIVE
Protein, UA: NEGATIVE
RBC UA: NEGATIVE
Spec Grav, UA: 1.025
Urobilinogen, UA: 0.2
pH, UA: 5.5

## 2016-01-03 LAB — POCT UA - MICROSCOPIC ONLY: TRICHOMONAS UA: POSITIVE

## 2016-01-03 MED ORDER — PHENAZOPYRIDINE HCL 200 MG PO TABS: 200.0000 mg | ORAL_TABLET | Freq: Three times a day (TID) | ORAL | 0 refills | Status: AC | PRN

## 2016-01-03 MED ORDER — METRONIDAZOLE 500 MG PO TABS: 2000.0000 mg | ORAL_TABLET | Freq: Once | ORAL | 0 refills | Status: AC

## 2016-01-03 MED ORDER — NITROFURANTOIN MONOHYD MACRO 100 MG PO CAPS: 100.0000 mg | ORAL_CAPSULE | Freq: Two times a day (BID) | ORAL | 0 refills | Status: AC

## 2016-01-03 MED ORDER — OXYBUTYNIN CHLORIDE 5 MG PO TABS: 2.5000 mg | ORAL_TABLET | Freq: Every day | ORAL | 0 refills | Status: DC

## 2016-01-03 NOTE — Telephone Encounter (Signed)
Pt was seen this morning and her Rx's were not at the pharmacy. Pt stated it was an antibiotic, a cream, and a medication for sweating (menopause). Pt uses [facility] on [address]. Please advise. Thanks! ep

## 2016-01-03 NOTE — Telephone Encounter (Signed)
Confirmed with pharmacy that patient had picked up most of her medications. Flagyl being filled. Reviewed which medications are for what symptoms with patient, and she expressed understanding.

## 2016-01-03 NOTE — Telephone Encounter (Signed)
Will forward to provider that saw patient this morning.  Derl Barrow, RN

## 2016-01-03 NOTE — Progress Notes (Signed)
[facility] Progress Note  Subjective:  Kristen [name] is a 55-y/o female with history of HTN and obesity who presents with dysuria and complaint of excessive sweating.  Dysuria: - "hurts" with urination, describes as a pressure rather than a burning - Started Monday (2 days ago) - thought urine looked "bloody" on Monday but has resolved - Denies recent sexual activity - No increased urinary frequency ROS: decreased appetite; no fevers or chills  Excessive sweating: - Has been going on for a few months and occurs "randomly" - Can happen outside or inside - Making it hard to sleep - Affects mostly her face, neck and breasts - Thinks she has been in menopause for over 10 years, and medical record notes menopausal symptoms since at least 2010 - Reports "a little" increased vaginal dryness ROS: No weight loss, no hemoptysis; does complain of feeling short of breath   HTN: - Has been out of lorsartan for a week (has Rx just hadn't been able to go to pharmacy)  Social: Former smoker  Allergies  Allergen Reactions  . Penicillins Hives, Itching and Rash    Has patient had a PCN reaction that required hospitalization No Has patient had a PCN reaction occurring within the last 10 years: Yes If all of the above answers are "NO", then may proceed with Cephalosporin use.   Marland Kitchen Amoxicillin Hives   Objective: Blood pressure (!) 143/84, pulse (!) 105, temperature 98.2 F (36.8 C), temperature source Oral, weight 222 lb (100.7 kg). Constitutional: Obese female in NAD Cardiovascular: RRR, S1, S2, no m/r/g. No chest wall tenderness.  Pulmonary/Chest: Effort normal and breath sounds normal. No respiratory distress.  Abdominal: Soft. +BS, TTP over suprapubic region, ND, no rebound or guarding.  Neurological: AOx3, no focal deficits. Skin: Skin is warm and dry. No rash noted. No erythema.  Psychiatric: Normal mood and affect.  Vitals reviewed  Assessment/Plan: Dysuria -  UA initially resulted with leukocytes and symptoms seemed c/w UTI, so nitrofurantoin and pyridium were prescribed. Urine culture obtained to check antibiotic sensitivity.  - Urine microscopic add-on revealed trichomonas. Patient called and informed. Metronidazole 500 mg x 4 prescribed. Pt counseled to take flagyl first and only take nitrofurantoin if symptoms have not resolved 1-2 days after taking flagyl.  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  Olene Floss, MD [place], PGY-2

## 2016-01-03 NOTE — Patient Instructions (Addendum)
Kristen [name],  I have prescribed the antibiotic nitrofurantoin to take every 12 hours x 5 days for bladder infection. I will call you if your urine culture suggests a different antibiotic would be better. Pyridium is a numbing medicine you can take 3 times daily for the next 2 days to help with pain.  For sweating, try oxybutinin 2.5 mg (1/2 a tablet) for the next few days. Increase to 2.5 mg twice daily if symptoms continue.  Please follow-up in about 2 weeks to see how your sweating is.  Best, Dr. Ola Spurr

## 2016-01-04 DIAGNOSIS — R61 Generalized hyperhidrosis: Secondary | ICD-10-CM | POA: Insufficient documentation

## 2016-01-04 NOTE — Assessment & Plan Note (Signed)
-   UA initially resulted with leukocytes and symptoms seemed c/w UTI, so nitrofurantoin and pyridium were prescribed. Urine culture obtained to check antibiotic sensitivity.  - Urine microscopic add-on revealed trichomonas. Patient called and informed. Metronidazole 500 mg x 4 prescribed. Pt counseled to take flagyl first and only take nitrofurantoin if symptoms have not resolved 1-2 days after taking flagyl.  - Patient counseled that any recent partners would need treatment for trichomonas as well

## 2016-01-04 NOTE — Assessment & Plan Note (Signed)
-   Suspect late post-menopausal symptoms - Recommended trial of oxybutynin, starting with 2.5 mg daily and increasing to 5 mg daily if minimal improvement - Counseled patient on potential side effects - Would be hesitant to start hormonal therapy given patient's decreased mobility, obesity and HTN

## 2016-01-04 NOTE — Assessment & Plan Note (Signed)
-   Elevated above goal of 140/90 in setting of lapse in taking medication

## 2016-01-05 LAB — URINE CULTURE: Organism ID, Bacteria: NO GROWTH

## 2016-01-12 ENCOUNTER — Encounter: Payer: Self-pay | Admitting: Pulmonary Disease

## 2016-01-12 ENCOUNTER — Ambulatory Visit (INDEPENDENT_AMBULATORY_CARE_PROVIDER_SITE_OTHER): Payer: Commercial Managed Care - HMO | Admitting: Pulmonary Disease

## 2016-01-12 ENCOUNTER — Encounter (INDEPENDENT_AMBULATORY_CARE_PROVIDER_SITE_OTHER): Payer: Self-pay

## 2016-01-12 VITALS — BP 102/60 | HR 83 | Ht 63.0 in | Wt 223.2 lb

## 2016-01-12 DIAGNOSIS — G4733 Obstructive sleep apnea (adult) (pediatric): Secondary | ICD-10-CM | POA: Diagnosis not present

## 2016-01-12 NOTE — Progress Notes (Signed)
Past Surgical History She  has a past surgical history that includes Laminectomy (01/20/2008); Tubal ligation; and carpel tunnel release.  Allergies Penicillin - Hives  Family History Her family history includes Anemia in her father; Arthritis in her brother; Asthma in her son; Heart disease in her mother; Hypertension in her father; Lupus in her daughter and mother.  Social History She  reports that she quit smoking about 25 years ago. Her smoking use included Cigarettes. She has a 0.12 pack-year smoking history. She has never used smokeless tobacco. She reports that she drinks alcohol. She reports that she uses drugs, including Marijuana, about 4 times per week.  Review of systems Constitutional: Positive for appetite change. Negative for fever and unexpected weight change.  HENT: Negative for congestion, dental problem, ear pain, nosebleeds, postnasal drip, rhinorrhea, sinus pressure, sneezing, sore throat and trouble swallowing.   Eyes: Negative for redness and itching.  Respiratory: Negative for cough, chest tightness, shortness of breath and wheezing.   Cardiovascular: Negative for palpitations and leg swelling.  Gastrointestinal: Negative for nausea and vomiting.  Genitourinary: Negative for dysuria.  Musculoskeletal: Positive for arthralgias. Negative for joint swelling.  Skin: Negative for rash.  Neurological: Negative for headaches.  Hematological: Does not bruise/bleed easily.  Psychiatric/Behavioral: Negative for dysphoric mood. The patient is not nervous/anxious.     Current Outpatient Prescriptions on File Prior to Visit  Medication Sig  . albuterol (PROVENTIL HFA;VENTOLIN HFA) 108 (90 BASE) MCG/ACT inhaler Inhale 1-2 puffs into the lungs every 6 (six) hours as needed for wheezing or shortness of breath.  Marland Kitchen aspirin 81 MG chewable tablet Chew 1 tablet (81 mg total) by mouth daily.  . carvedilol (COREG) 25 MG tablet take 1 tablet by mouth twice a day with meals  . cetirizine  (ZYRTEC) 10 MG tablet Take 1 tablet (10 mg total) by mouth daily. (Patient taking differently: Take 10 mg by mouth daily as needed for allergies. )  . cyclobenzaprine (FLEXERIL) 10 MG tablet Take 10 mg by mouth 3 (three) times daily as needed for muscle spasms.  . diclofenac (VOLTAREN) 75 MG EC tablet Take 1 tablet (75 mg total) by mouth 2 (two) times daily.  . fluticasone (FLONASE) 50 MCG/ACT nasal spray Place 2 sprays into both nostrils daily. (Patient taking differently: Place 2 sprays into both nostrils as needed. )  . gabapentin (NEURONTIN) 300 MG capsule Take 2 capsules (600 mg total) by mouth 3 (three) times daily.  . hydrochlorothiazide (HYDRODIURIL) 25 MG tablet Take 1 tablet (25 mg total) by mouth daily.  Marland Kitchen ibuprofen (ADVIL,MOTRIN) 800 MG tablet Take 1 tablet (800 mg total) by mouth 3 (three) times daily. (Patient taking differently: Take 800 mg by mouth as needed. )  . losartan (COZAAR) 50 MG tablet Take 1 tablet (50 mg total) by mouth daily.  . montelukast (SINGULAIR) 10 MG tablet Take 1 tablet (10 mg total) by mouth at bedtime. (Patient taking differently: Take 10 mg by mouth daily. )  . oxybutynin (DITROPAN) 5 MG tablet Take 0.5 tablets (2.5 mg total) by mouth daily.   No current facility-administered medications on file prior to visit.     Chief Complaint  Patient presents with  . Sleep Consult    Referred by Dr Lorenso Courier. Sleep Study at [facility] x 4 years ago. Epworth Score: 6    Sleep tests PSG 12/19/11 >> AHI 19, SpO2 77%  Past medical history She  has a past medical history of Allergy; Back pain; Hyperlipidemia; Hypertension; Leg pain; Obesity; Peripheral  neuropathy ([place]); and Post-menopausal.  Vital signs BP 102/60 (BP Location: Left Arm, Cuff Size: Normal)   Pulse 83   Ht 5\' 3"  (1.6 m)   Wt 223 lb 3.2 oz (101.2 kg)   SpO2 94%   BMI 39.54 kg/m   History of Present Illness Kristen [name] is a 55 y.o. female for evaluation of sleep problems.  She has a sleep study  in 2013 >> showed moderate sleep apnea.  She was not aware of results, and does not recall ever being on therapy for sleep apnea.  Her sleep has gotten worse since 2013.  She has trouble falling asleep and staying asleep.  She wakes up with a gasp, and thinks she might have mild snoring.  She has trouble sleeping on her back.  She never dreams.  She thinks she only gets 2 to 3 hours of sleep per night, but her sleep is very disrupted.  She tries to go to sleep at 10 pm.  She falls asleep after 1 to 2 hours.  She wakes up a few times to use the bathroom.  She gets out of bed at 630 am.  She feels tired in the morning.  She sometimes get headaches in the morning.  She does not use anything to help her fall sleep or stay awake.  She denies sleep walking, sleep talking, bruxism, or nightmares.  There is no history of restless legs.  She denies sleep hallucinations, sleep paralysis, or cataplexy.  The Epworth score is 6 out of 24.   Physical Exam:  General - No distress ENT - No sinus tenderness, no oral exudate, no LAN, no thyromegaly, TM clear, pupils equal/reactive, MP 3 Cardiac - s1s2 regular, no murmur, pulses symmetric Chest - No wheeze/rales/dullness, good air entry, normal respiratory excursion Back - No focal tenderness Abd - Soft, non-tender, no organomegaly, + bowel sounds Ext - No edema Neuro - Normal strength, cranial nerves intact Skin - No rashes Psych - Normal mood, and behavior  Discussion: She has snoring, sleep disruption, apnea, and daytime sleepiness.  She has hx of HTN on multiple medications.  She has a history of moderate obstructive sleep apnea.  I am concerned she still could have sleep apnea.  We discussed how sleep apnea can affect various health problems, including risks for hypertension, cardiovascular disease, and diabetes.  We also discussed how sleep disruption can increase risks for accidents, such as while driving.  Weight loss as a means of improving sleep  apnea was also reviewed.  Additional treatment options discussed were CPAP therapy, oral appliance, and surgical intervention.  Assessment/plan:  Obstructive sleep apnea. - will arrange for home sleep study pending insurance approval   Patient Instructions  Will arrange for home sleep study Will call to arrange for follow up after sleep study reviewed     Chesley Mires, M.D. Pager [phone] 01/12/2016, 9:50 AM

## 2016-01-12 NOTE — Progress Notes (Signed)
   Subjective:    Patient ID: Kristen [name], female    DOB: 1960-10-29, 55 y.o.   MRN: [id number]  HPI    Review of Systems  Constitutional: Positive for appetite change. Negative for fever and unexpected weight change.  HENT: Negative for congestion, dental problem, ear pain, nosebleeds, postnasal drip, rhinorrhea, sinus pressure, sneezing, sore throat and trouble swallowing.   Eyes: Negative for redness and itching.  Respiratory: Negative for cough, chest tightness, shortness of breath and wheezing.   Cardiovascular: Negative for palpitations and leg swelling.  Gastrointestinal: Negative for nausea and vomiting.  Genitourinary: Negative for dysuria.  Musculoskeletal: Positive for arthralgias. Negative for joint swelling.  Skin: Negative for rash.  Neurological: Negative for headaches.  Hematological: Does not bruise/bleed easily.  Psychiatric/Behavioral: Negative for dysphoric mood. The patient is not nervous/anxious.        Objective:   Physical Exam        Assessment & Plan:

## 2016-01-12 NOTE — Patient Instructions (Signed)
Will arrange for home sleep study Will call to arrange for follow up after sleep study reviewed  

## 2016-01-17 ENCOUNTER — Ambulatory Visit (AMBULATORY_SURGERY_CENTER): Payer: Commercial Managed Care - HMO | Admitting: Gastroenterology

## 2016-01-17 ENCOUNTER — Encounter: Payer: Self-pay | Admitting: Gastroenterology

## 2016-01-17 VITALS — BP 124/86 | HR 70 | Temp 98.6°F | Resp 16 | Ht 63.0 in | Wt 226.0 lb

## 2016-01-17 DIAGNOSIS — D123 Benign neoplasm of transverse colon: Secondary | ICD-10-CM | POA: Diagnosis not present

## 2016-01-17 DIAGNOSIS — Z86010 Personal history of colonic polyps: Secondary | ICD-10-CM

## 2016-01-17 DIAGNOSIS — G4733 Obstructive sleep apnea (adult) (pediatric): Secondary | ICD-10-CM | POA: Diagnosis not present

## 2016-01-17 DIAGNOSIS — I10 Essential (primary) hypertension: Secondary | ICD-10-CM | POA: Diagnosis not present

## 2016-01-17 MED ORDER — SODIUM CHLORIDE 0.9 % IV SOLN: 500.0000 mL | INTRAVENOUS | Status: DC

## 2016-01-17 NOTE — Progress Notes (Signed)
Report given to PACU RN, vss 

## 2016-01-17 NOTE — Patient Instructions (Signed)
YOU HAD AN ENDOSCOPIC PROCEDURE TODAY AT [facility]:   Refer to the procedure report that was given to you for any specific questions about what was found during the examination.  If the procedure report does not answer your questions, please call your gastroenterologist to clarify.  If you requested that your care partner not be given the details of your procedure findings, then the procedure report has been included in a sealed envelope for you to review at your convenience later.  YOU SHOULD EXPECT: Some feelings of bloating in the abdomen. Passage of more gas than usual.  Walking can help get rid of the air that was put into your GI tract during the procedure and reduce the bloating. If you had a lower endoscopy (such as a colonoscopy or flexible sigmoidoscopy) you may notice spotting of blood in your stool or on the toilet paper. If you underwent a bowel prep for your procedure, you may not have a normal bowel movement for a few days.  Please Note:  You might notice some irritation and congestion in your nose or some drainage.  This is from the oxygen used during your procedure.  There is no need for concern and it should clear up in a day or so.  SYMPTOMS TO REPORT IMMEDIATELY:   Following lower endoscopy (colonoscopy or flexible sigmoidoscopy):  Excessive amounts of blood in the stool  Significant tenderness or worsening of abdominal pains  Swelling of the abdomen that is new, acute  Fever of 100F or higher   For urgent or emergent issues, a gastroenterologist can be reached at any hour by calling [phone].   DIET:  We do recommend a small meal at first, but then you may proceed to your regular diet.  Drink plenty of fluids but you should avoid alcoholic beverages for 24 hours.  ACTIVITY:  You should plan to take it easy for the rest of today and you should NOT DRIVE or use heavy machinery until tomorrow (because of the sedation medicines used during the test).     FOLLOW UP: Our staff will call the number listed on your records the next business day following your procedure to check on you and address any questions or concerns that you may have regarding the information given to you following your procedure. If we do not reach you, we will leave a message.  However, if you are feeling well and you are not experiencing any problems, there is no need to return our call.  We will assume that you have returned to your regular daily activities without incident.  If any biopsies were taken you will be contacted by phone or by letter within the next 1-3 weeks.  Please call us at [phone] if you have not heard about the biopsies in 3 weeks.    SIGNATURES/CONFIDENTIALITY: You and/or your care partner have signed paperwork which will be entered into your electronic medical record.  These signatures attest to the fact that that the information above on your After Visit Summary has been reviewed and is understood.  Full responsibility of the confidentiality of this discharge information lies with you and/or your care-partner.  Polyp handout provided. Resume present medications.

## 2016-01-17 NOTE — Progress Notes (Signed)
Called to room to assist during endoscopic procedure.  Patient ID and intended procedure confirmed with present staff. Received instructions for my participation in the procedure from the performing physician.  

## 2016-01-17 NOTE — Op Note (Signed)
[facility] Patient Name: Kristen [name] Procedure Date: 01/17/2016 10:39 AM MRN: [id number] Endoscopist: Mallie Mussel L. Loletha Carrow , MD Age: 55 Referring MD:  Date of Birth: January 04, 1961 Gender: Female Account #: [id number] Procedure:                Colonoscopy Indications:              Surveillance: Personal history of adenomatous                            polyps on last colonoscopy > 3 years ago Medicines:                Monitored Anesthesia Care Procedure:                Pre-Anesthesia Assessment:                           - Prior to the procedure, a History and Physical                            was performed, and patient medications and                            allergies were reviewed. The patient's tolerance of                            previous anesthesia was also reviewed. The risks                            and benefits of the procedure and the sedation                            options and risks were discussed with the patient.                            All questions were answered, and informed consent                            was obtained. Prior Anticoagulants: The patient has                            taken no previous anticoagulant or antiplatelet                            agents. ASA Grade Assessment: III - A patient with                            severe systemic disease. After reviewing the risks                            and benefits, the patient was deemed in                            satisfactory condition to undergo the procedure.  After obtaining informed consent, the colonoscope                            was passed under direct vision. Throughout the                            procedure, the patient's blood pressure, pulse, and                            oxygen saturations were monitored continuously. The                            Model CF-HQ190L [id number]) scope was introduced                            through the anus  and advanced to the the cecum,                            identified by appendiceal orifice and ileocecal                            valve. The colonoscopy was performed without                            difficulty. The patient tolerated the procedure                            well. The quality of the bowel preparation was                            good. The ileocecal valve, appendiceal orifice, and                            rectum were photographed. The quality of the bowel                            preparation was evaluated using the BBPS [facility]                            Bowel Preparation Scale) with scores of: Right                            Colon = 3, Transverse Colon = 2 and Left Colon = 2.                            The total BBPS score equals 7. The bowel                            preparation used was SUPREP. Scope In: 10:43:18 AM Scope Out: 10:56:22 AM Scope Withdrawal Time: 0 hours 10 minutes 21 seconds  Total Procedure Duration: 0 hours 13 minutes 4 seconds  Findings:                 The perianal  and digital rectal examinations were                            normal.                           Two sessile polyps were found in the hepatic                            flexure. The polyps were 4 mm in size. These polyps                            were removed with a cold snare. Resection and                            retrieval were complete. Verification of patient                            identification for the specimen was done.                           Retroflexion in the rectum was not performed due to                            anatomy.                           The exam was otherwise without abnormality. Complications:            No immediate complications. Estimated Blood Loss:     Estimated blood loss: none. Impression:               - Two 4 mm polyps at the hepatic flexure, removed                            with a cold snare. Resected and retrieved.                            - The examination was otherwise normal. Recommendation:           - Patient has a contact number available for                            emergencies. The signs and symptoms of potential                            delayed complications were discussed with the                            patient. Return to normal activities tomorrow.                            Written discharge instructions were provided to the                            patient.                           -  Resume previous diet.                           - Continue present medications.                           - Await pathology results.                           - Repeat colonoscopy is recommended for                            surveillance. The colonoscopy date will be                            determined after pathology results from today's                            exam become available for review. Henry L. Loletha Carrow, MD 01/17/2016 11:05:54 AM This report has been signed electronically.

## 2016-01-18 ENCOUNTER — Telehealth: Payer: Self-pay | Admitting: *Deleted

## 2016-01-18 DIAGNOSIS — G4733 Obstructive sleep apnea (adult) (pediatric): Secondary | ICD-10-CM | POA: Diagnosis not present

## 2016-01-18 NOTE — Telephone Encounter (Signed)
  Follow up Call-  Call back number 01/17/2016  Post procedure Call Back phone  # [phone]  Permission to leave phone message Yes  Some recent data might be hidden     Patient questions:  Do you have a fever, pain , or abdominal swelling? No. Pain Score  0 *  Have you tolerated food without any problems? Yes.    Have you been able to return to your normal activities? Yes.    Do you have any questions about your discharge instructions: Diet   No. Medications  No. Follow up visit  No.  Do you have questions or concerns about your Care? No.  Actions: * If pain score is 4 or above: No action needed, pain <4.

## 2016-01-19 ENCOUNTER — Telehealth: Payer: Self-pay | Admitting: Pulmonary Disease

## 2016-01-19 DIAGNOSIS — G4733 Obstructive sleep apnea (adult) (pediatric): Secondary | ICD-10-CM | POA: Diagnosis not present

## 2016-01-19 NOTE — Telephone Encounter (Signed)
LM x 1 

## 2016-01-19 NOTE — Telephone Encounter (Signed)
HST 01/18/16 >> AHI 16, SaO2 low 80%.  Spent 103.6 min with SaO2 < 89%.   Will have my nurse inform pt that she has moderate sleep apnea and low oxygen level.  She needs to get set up for CPAP titration study in sleep lab.  Will call to schedule follow up after this, unless she would like ROV first prior to arranging CPAP titration study.

## 2016-01-23 ENCOUNTER — Encounter: Payer: Self-pay | Admitting: Gastroenterology

## 2016-01-23 ENCOUNTER — Other Ambulatory Visit: Payer: Self-pay | Admitting: *Deleted

## 2016-01-23 DIAGNOSIS — G4733 Obstructive sleep apnea (adult) (pediatric): Secondary | ICD-10-CM

## 2016-01-23 NOTE — Telephone Encounter (Signed)
Results have been explained to patient, pt expressed understanding. Order placed for CPAP Titration study. Nothing further needed.  

## 2016-02-01 ENCOUNTER — Ambulatory Visit: Payer: Commercial Managed Care - HMO | Admitting: Family Medicine

## 2016-02-14 ENCOUNTER — Ambulatory Visit: Payer: Commercial Managed Care - HMO | Admitting: Family Medicine

## 2016-02-19 NOTE — Progress Notes (Signed)
Subjective:   Patient ID: Kristen [name]    DOB: November 30, 1960, 55 y.o. female   MRN: [id number]  CC: New medication  HPI: Kristen [name] is a 55 y.o. female who presents to clinic today f/u concerning newly rx oxybutinin. Problems discussed today are as follows:  1. Post-Menopausal Sweating: patient has been menopausal for 10 years. Was c/o intermittent sweating inside and outside for past 6 months. Affects mainly face, neck, and breasts. Denies h/o fevers, CP, SOB, weight loss, hemoptysis. Also denies vaginal dryness. Given Oxybutinin 2.5 mg daily on 8/17 with improvement of symptoms. Denies side effects of lightheadedness, fatigue, or feelings of syncope.  2. Obesity: discouraged by no decrease in weight despite exercising 3 times per week for 1 hour sessions. States she walks and does water aerobics. Dieting by decreasing food intake but not watching what she eats.  ROS: See HPI for pertinent ROS.  [place]: Pertinent past medical, surgical, family, and social history were reviewed and updated as appropriate. Smoking status reviewed.  Medications reviewed. Current Outpatient Prescriptions  Medication Sig Dispense Refill  . albuterol (PROVENTIL HFA;VENTOLIN HFA) 108 (90 BASE) MCG/ACT inhaler Inhale 1-2 puffs into the lungs every 6 (six) hours as needed for wheezing or shortness of breath. 1 Inhaler 3  . aspirin 81 MG chewable tablet Chew 1 tablet (81 mg total) by mouth daily. 10 tablet 0  . carvedilol (COREG) 25 MG tablet take 1 tablet by mouth twice a day with meals 60 tablet 2  . cetirizine (ZYRTEC) 10 MG tablet Take 1 tablet (10 mg total) by mouth daily. (Patient taking differently: Take 10 mg by mouth daily as needed for allergies. ) 30 tablet 11  . cyclobenzaprine (FLEXERIL) 10 MG tablet Take 1 tablet (10 mg total) by mouth 3 (three) times daily as needed for muscle spasms. 90 tablet 0  . fluticasone (FLONASE) 50 MCG/ACT nasal spray Place 2 sprays into both nostrils daily.  (Patient taking differently: Place 2 sprays into both nostrils as needed. ) 16 g 6  . gabapentin (NEURONTIN) 300 MG capsule Take 2 capsules (600 mg total) by mouth 3 (three) times daily. 180 capsule 1  . hydrochlorothiazide (HYDRODIURIL) 25 MG tablet Take 1 tablet (25 mg total) by mouth daily. 30 tablet 3  . ibuprofen (ADVIL,MOTRIN) 800 MG tablet Take 1 tablet (800 mg total) by mouth 3 (three) times daily. (Patient taking differently: Take 800 mg by mouth as needed. ) 21 tablet 0  . losartan (COZAAR) 50 MG tablet Take 1 tablet (50 mg total) by mouth daily. 30 tablet 1  . oxybutynin (DITROPAN) 5 MG tablet Take 0.5 tablets (2.5 mg total) by mouth daily. 30 tablet 2  . pantoprazole (PROTONIX) 20 MG tablet Take 1 tablet (20 mg total) by mouth daily. 30 tablet 2   Current Facility-Administered Medications  Medication Dose Route Frequency Provider Last Rate Last Dose  . 0.9 %  sodium chloride infusion  500 mL Intravenous Continuous Nelida Meuse III, MD        Objective:   BP (!) 138/96 (BP Location: Left Arm, Patient Position: Sitting, Cuff Size: Normal)   Pulse 87   Temp 98 F (36.7 C) (Oral)   Ht 5\' 3"  (1.6 m)   Wt 220 lb (99.8 kg)   BMI 38.97 kg/m  Vitals and nursing note reviewed.  General: obese, well nourished, well developed, in no acute distress with non-toxic appearance HEENT: normocephalic, atraumatic, moist mucous membranes Neck: supple, non-tender without lymphadenopathy CV: regular rate  and rhythm without murmurs, rubs, or gallops Lungs: clear to auscultation bilaterally with normal work of breathing Abdomen: soft, non-tender, non-distended, no masses or organomegaly palpable, normoactive bowel sounds Skin: warm, dry, no rashes or lesions, cap refill < 2 seconds Extremities: warm and well perfused, normal tone  Assessment & Plan:   MENOPAUSAL SYNDROME Improved. Sweating on face, neck, chest. Resolved with use of oxybutynin 2.5 mg. Denies side effects. Tolerating  medication well. - Continue Oxybutynin 2.5 mg daily for menopausal syndrome - Patient to follow up in 3 months  Morbid obesity Chronic. Patient is attempting to loose weight by exercise. Not sure if she understands what foods may make her overweight.  - Encouraged to continue to exercise as this will help and is good for her health in general - Discussed restricting excessive carbs and foods high in salt which may cause fluid retention - Can consider nutrition referral during next visit if patient is interested  No orders of the defined types were placed in this encounter.  Meds ordered this encounter  Medications  . oxybutynin (DITROPAN) 5 MG tablet    Sig: Take 0.5 tablets (2.5 mg total) by mouth daily.    Dispense:  30 tablet    Refill:  2  . pantoprazole (PROTONIX) 20 MG tablet    Sig: Take 1 tablet (20 mg total) by mouth daily.    Dispense:  30 tablet    Refill:  2  . cyclobenzaprine (FLEXERIL) 10 MG tablet    Sig: Take 1 tablet (10 mg total) by mouth 3 (three) times daily as needed for muscle spasms.    Dispense:  90 tablet    Refill:  0    Harriet Butte, DO [place], PGY-1 02/20/2016 11:44 AM

## 2016-02-20 ENCOUNTER — Ambulatory Visit (INDEPENDENT_AMBULATORY_CARE_PROVIDER_SITE_OTHER): Payer: Commercial Managed Care - HMO | Admitting: Family Medicine

## 2016-02-20 ENCOUNTER — Encounter: Payer: Self-pay | Admitting: Family Medicine

## 2016-02-20 DIAGNOSIS — N951 Menopausal and female climacteric states: Secondary | ICD-10-CM | POA: Diagnosis not present

## 2016-02-20 MED ORDER — OXYBUTYNIN CHLORIDE 5 MG PO TABS: 2.5000 mg | ORAL_TABLET | Freq: Every day | ORAL | 2 refills | Status: DC

## 2016-02-20 MED ORDER — PANTOPRAZOLE SODIUM 20 MG PO TBEC: 20.0000 mg | DELAYED_RELEASE_TABLET | Freq: Every day | ORAL | 2 refills | Status: DC

## 2016-02-20 MED ORDER — CYCLOBENZAPRINE HCL 10 MG PO TABS: 10.0000 mg | ORAL_TABLET | Freq: Three times a day (TID) | ORAL | 0 refills | Status: DC | PRN

## 2016-02-20 NOTE — Assessment & Plan Note (Addendum)
Chronic. Patient is attempting to loose weight by exercise. Not sure if she understands what foods may make her overweight.  - Encouraged to continue to exercise as this will help and is good for her health in general - Discussed restricting excessive carbs and foods high in salt which may cause fluid retention - Can consider nutrition referral during next visit if patient is interested

## 2016-02-20 NOTE — Patient Instructions (Signed)
It was a pleasure to meet you today. Please see below to review our plan for today's visit.  1. Continue oxybutynin as prescribed 2. I have refilled your oxybutynin, Protonix and flexeril 3. Please follow up in 1 month  Please call the clinic at [phone] if your symptoms worsen or you have any concerns. It was my pleasure to see you. -- Harriet Butte, [place], PGY-1

## 2016-02-20 NOTE — Assessment & Plan Note (Signed)
Improved. Sweating on face, neck, chest. Resolved with use of oxybutynin 2.5 mg. Denies side effects. Tolerating medication well. - Continue Oxybutynin 2.5 mg daily for menopausal syndrome - Patient to follow up in 3 months

## 2016-02-22 ENCOUNTER — Other Ambulatory Visit: Payer: Self-pay | Admitting: Family Medicine

## 2016-02-22 DIAGNOSIS — I10 Essential (primary) hypertension: Secondary | ICD-10-CM

## 2016-03-10 ENCOUNTER — Ambulatory Visit (HOSPITAL_BASED_OUTPATIENT_CLINIC_OR_DEPARTMENT_OTHER): Payer: Commercial Managed Care - HMO | Attending: Pulmonary Disease | Admitting: Pulmonary Disease

## 2016-03-10 VITALS — Ht 63.0 in | Wt 220.0 lb

## 2016-03-10 DIAGNOSIS — G4733 Obstructive sleep apnea (adult) (pediatric): Secondary | ICD-10-CM | POA: Diagnosis not present

## 2016-03-13 ENCOUNTER — Telehealth: Payer: Self-pay | Admitting: Pulmonary Disease

## 2016-03-13 DIAGNOSIS — G4733 Obstructive sleep apnea (adult) (pediatric): Secondary | ICD-10-CM | POA: Diagnosis not present

## 2016-03-13 NOTE — Telephone Encounter (Signed)
CPAP titration 03/10/16 >> CPAP 7 cm H2O >> AHI 0, +s, no R  Will have my nurse inform pt that sleep study showed good control of sleep apnea with CPAP 7 cm H2O.    Please arrange for CPAP 7 cm H2O with heated humidity and mask of choice.  Arrange ROV 2 months after getting CPAP.  ROV can be with me or NP.

## 2016-03-13 NOTE — Progress Notes (Signed)
Patient Name: Kristen, Prier [name]: [id number] Gender: Female D.O.B: 1960/07/21 Age (years): 89 Referring Provider: Chesley Mires MD, ABSM Height (inches): 63 Interpreting Physician: Chesley Mires MD, ABSM Weight (lbs): 220 RPSGT: Gerhard Perches BMI: 39 MRN: [id number] Neck Size: 14.50  CLINICAL INFORMATION The patient is referred for a CPAP titration to treat sleep apnea.   [name] of NPSG, Split Night or HST: 01/18/16, AHI 16, SpO2 low 80%.  SLEEP STUDY TECHNIQUE As per the AASM Manual for the Scoring of Sleep and Associated Events v2.3 (April 2016) with a hypopnea requiring 4% desaturations. The channels recorded and monitored were frontal, central and occipital EEG, electrooculogram (EOG), submentalis EMG (chin), nasal and oral airflow, thoracic and abdominal wall motion, anterior tibialis EMG, snore microphone, electrocardiogram, and pulse oximetry. Continuous positive airway pressure (CPAP) was initiated at the beginning of the study and titrated to treat sleep-disordered breathing.  MEDICATIONS Medications self-administered by patient taken the night of the study : CYCLOBENZAPRINE HCL  TECHNICIAN COMMENTS Comments added by technician: CPAP TITRATION.  Comments added by scorer: N/A  RESPIRATORY PARAMETERS Optimal PAP Pressure (cm): 7 AHI at Optimal Pressure (/hr): 0.0 Overall Minimal O2 (%): 88.00 Supine % at Optimal Pressure (%): 13 Minimal O2 at Optimal Pressure (%): 88.0    SLEEP ARCHITECTURE The study was initiated at 10:57:20 PM and ended at 5:07:51 AM. Sleep onset time was 2.2 minutes and the sleep efficiency was 65.3%. The total sleep time was 241.8 minutes. The patient spent 7.65% of the night in stage N1 sleep, 92.35% in stage N2 sleep, 0.00% in stage N3 and 0.00% in REM.Stage REM latency was N/A minutes Wake after sleep onset was 126.5. Alpha intrusion was absent. Supine sleep was 54.92%.  CARDIAC DATA The 2 lead EKG demonstrated sinus rhythm. The mean  heart rate was 74.16 beats per minute. Other EKG findings include: None.  LEG MOVEMENT DATA The total Periodic Limb Movements of Sleep (PLMS) were 12. The PLMS index was 2.98. A PLMS index of <15 is considered normal in adults.  IMPRESSIONS - She did well with CPAP 7 cm H2O.  At this pressure the AHI was 0.  She was observed in supine sleep, but not REM sleep at this pressure setting.  She did not require supplemental oxygen at this pressure setting.  DIAGNOSIS - Obstructive Sleep Apnea (327.23 [G47.33 ICD-10])  RECOMMENDATIONS - Trial of CPAP therapy on 7 cm H2O. - She was fitted with a small size Resmed Full Face Mask AirFit F20 mask and heated humidification.  [Electronically signed] 03/13/2016 09:57 AM  Chesley Mires MD, ABSM Diplomate, American Board of Sleep Medicine   NPI: [id number]

## 2016-03-13 NOTE — Procedures (Signed)
  Patient Name: Kristen [name], [place] Date: [id number] Gender: Female D.O.B: 05-21-1960 Age (years): 63 Referring Provider: Chesley Mires MD, ABSM Height (inches): 63 Interpreting Physician: Chesley Mires MD, ABSM Weight (lbs): 220 RPSGT: Gerhard Perches BMI: 39 MRN: [id number] Neck Size: 14.50  CLINICAL INFORMATION The patient is referred for a CPAP titration to treat sleep apnea.   Date of NPSG, Split Night or HST: 01/18/16, AHI 16, SpO2 low 80%.  SLEEP STUDY TECHNIQUE As per the AASM Manual for the Scoring of Sleep and Associated Events v2.3 (April 2016) with a hypopnea requiring 4% desaturations. The channels recorded and monitored were frontal, central and occipital EEG, electrooculogram (EOG), submentalis EMG (chin), nasal and oral airflow, thoracic and abdominal wall motion, anterior tibialis EMG, snore microphone, electrocardiogram, and pulse oximetry. Continuous positive airway pressure (CPAP) was initiated at the beginning of the study and titrated to treat sleep-disordered breathing.  MEDICATIONS Medications self-administered by patient taken the night of the study : CYCLOBENZAPRINE HCL  TECHNICIAN COMMENTS Comments added by technician: CPAP TITRATION.  Comments added by scorer: N/A  RESPIRATORY PARAMETERS Optimal PAP Pressure (cm): 7 AHI at Optimal Pressure (/hr): 0.0 Overall Minimal O2 (%): 88.00 Supine % at Optimal Pressure (%): 13 Minimal O2 at Optimal Pressure (%): 88.0    SLEEP ARCHITECTURE The study was initiated at 10:57:20 PM and ended at 5:07:51 AM. Sleep onset time was 2.2 minutes and the sleep efficiency was 65.3%. The total sleep time was 241.8 minutes. The patient spent 7.65% of the night in stage N1 sleep, 92.35% in stage N2 sleep, 0.00% in stage N3 and 0.00% in REM.Stage REM latency was N/A minutes Wake after sleep onset was 126.5. Alpha intrusion was absent. Supine sleep was 54.92%.  CARDIAC DATA The 2 lead EKG demonstrated sinus rhythm. The  mean heart rate was 74.16 beats per minute. Other EKG findings include: None.  LEG MOVEMENT DATA The total Periodic Limb Movements of Sleep (PLMS) were 12. The PLMS index was 2.98. A PLMS index of <15 is considered normal in adults.  IMPRESSIONS - She did well with CPAP 7 cm H2O.  At this pressure the AHI was 0.  She was observed in supine sleep, but not REM sleep at this pressure setting.  She did not require supplemental oxygen at this pressure setting.  DIAGNOSIS - Obstructive Sleep Apnea (327.23 [G47.33 ICD-10])  RECOMMENDATIONS - Trial of CPAP therapy on 7 cm H2O. - She was fitted with a mall size Resmed Full Face Mask AirFit F20 mask and heated humidification.  [Electronically signed] 03/13/2016 09:57 AM  Chesley Mires MD, ABSM Diplomate, American Board of Sleep Medicine   NPI: [id number]

## 2016-03-15 NOTE — Telephone Encounter (Signed)
LM x 1 

## 2016-04-03 ENCOUNTER — Ambulatory Visit (INDEPENDENT_AMBULATORY_CARE_PROVIDER_SITE_OTHER): Payer: Commercial Managed Care - HMO | Admitting: Family Medicine

## 2016-04-03 ENCOUNTER — Encounter: Payer: Self-pay | Admitting: Family Medicine

## 2016-04-03 VITALS — BP 136/100 | HR 112 | Temp 98.2°F | Ht 63.0 in | Wt 222.0 lb

## 2016-04-03 DIAGNOSIS — M5440 Lumbago with sciatica, unspecified side: Secondary | ICD-10-CM | POA: Diagnosis not present

## 2016-04-03 DIAGNOSIS — M5442 Lumbago with sciatica, left side: Secondary | ICD-10-CM

## 2016-04-03 DIAGNOSIS — R062 Wheezing: Secondary | ICD-10-CM | POA: Diagnosis not present

## 2016-04-03 DIAGNOSIS — M542 Cervicalgia: Secondary | ICD-10-CM

## 2016-04-03 DIAGNOSIS — I10 Essential (primary) hypertension: Secondary | ICD-10-CM

## 2016-04-03 DIAGNOSIS — G8929 Other chronic pain: Secondary | ICD-10-CM

## 2016-04-03 LAB — BASIC METABOLIC PANEL WITH GFR
BUN: 13 mg/dL (ref 7–25)
CALCIUM: 9.5 mg/dL (ref 8.6–10.4)
CO2: 28 mmol/L (ref 20–31)
Chloride: 104 mmol/L (ref 98–110)
Creat: 1 mg/dL (ref 0.50–1.05)
GFR, EST NON AFRICAN AMERICAN: 64 mL/min (ref >=60)
GFR, Est African American: 74 mL/min (ref >=60)
GLUCOSE: 91 mg/dL (ref 65–99)
Potassium: 4.4 mmol/L (ref 3.5–5.3)
Sodium: 140 mmol/L (ref 135–146)

## 2016-04-03 MED ORDER — HYDROCHLOROTHIAZIDE 50 MG PO TABS: 50.0000 mg | ORAL_TABLET | Freq: Every day | ORAL | 1 refills | Status: DC

## 2016-04-03 MED ORDER — ALBUTEROL SULFATE HFA 108 (90 BASE) MCG/ACT IN AERS: 1.0000 | INHALATION_SPRAY | Freq: Four times a day (QID) | RESPIRATORY_TRACT | 3 refills | Status: DC | PRN

## 2016-04-03 MED ORDER — OXYCODONE-ACETAMINOPHEN 5-325 MG PO TABS: 1.0000 | ORAL_TABLET | Freq: Three times a day (TID) | ORAL | 0 refills | Status: DC | PRN

## 2016-04-03 MED ORDER — PREDNISONE 50 MG PO TABS: 50.0000 mg | ORAL_TABLET | Freq: Every day | ORAL | 0 refills | Status: DC

## 2016-04-03 NOTE — Patient Instructions (Addendum)
It was good to see you.  I have referred you back to the neurologist for your back pain, they may opt to repeat imaging. I have prescribed you some pain medication to get you through this acute phase.  Your blood pressure is still elevated, I have increased your hydrochlorothiazide to 50mg  daily.

## 2016-04-03 NOTE — Progress Notes (Signed)
Subjective: CC: follow up HPI: Patient is a 55 y.o. female with a past medical history of HTN, OSA, chronic lumbar pain, chronic left sided neck pain  presenting to clinic today for multiple complaints including chronic back pain with left-sided sciatica, chronic right-sided neck pain and cold symptoms..  Back pain: chronic ("forever"). Dull stabbing pain in the lumbar region where she had previous disc issues.. Ambulates with a cane. Movement and prolonged sitting makes it worse. She has stable shooting/stabbing pain that radiates down the left leg. No numbness and tingling in the legs. She has generalized weakness secondary to pain, but no new weakness. No saddles paresthesias. No urinary or bowel incontinence.  She notes that the muscle relaxer isn't working. She takes muscle relaxers 3-4x/day without improvement. Gabapentin isn't helping either. These don't make her sleepy. She's also had a trial of steroids, which she did not fill helps her lumbar pain either.  Neck pain: Shooting/stabbing right lateral neck pain that is chronic. Movement sometimes makes it worse. Sometimes has numbness from neck to shoulder on the right.  State this has been going on "forever."  Notes it may be a little more bothersome, but thinks it could be secondary to the cold.  No shooting pains or weakness in the arms.   Cold: 2 months ago started having cough productive of clear phlegm. Some SOB with coughing. Nothing makes it worse. Has significant nasal and chest congestion. No fevers/chills.  No otalgias, sore throat, chest pain, SOB at rest.  Tried Flonase, Mucinex without improvement.    Hypertension Blood pressure at home: doesn't check  Blood pressure today: hasn't taken BP medication in several days.  Side effects: none ROS: Denies headache, dizziness, visual changes, nausea, vomiting, chest pain, abdominal pain or shortness of breath.   Social History: former smoker  ROS: All other systems reviewed  and are negative.  Past Medical History Patient Active Problem List   Diagnosis Date Noted  . 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 Dizziness 02/21/2015  . 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 Myalgia 12/18/2014  . Routine adult health maintenance 07/20/2014  . Hamstring injury 12/22/2013  . Left shoulder pain 09/29/2013  . Bilateral edema of lower extremity 09/29/2013  . Chest pain, atypical 08/28/2012  . New onset of headaches 08/05/2012  . Neck pain on left side 12/26/2011  . Obstructive sleep apnea 12/26/2011  . Fatigue 09/13/2011  . Prediabetes 07/18/2011  . Back pain of lumbar region with sciatica 04/26/2010  . Morbid obesity ([place]) 04/16/2010  . 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 GAIT DISTURBANCE 05/03/2009  . Allergic rhinitis 09/15/2008  . MENOPAUSAL SYNDROME 09/05/2008  . Hyperlipemia 06/12/2007  . HYPERTENSION, BENIGN SYSTEMIC 07/17/2006    Medications- reviewed and updated  Objective: Office vital signs reviewed. BP (!) 136/100   Pulse (!) 112   Temp 98.2 F (36.8 C) (Oral)   Ht 5\' 3"  (1.6 m)   Wt 222 lb (100.7 kg)   SpO2 96%   BMI 39.33 kg/m    Physical Examination:  General: Awake, alert, well- nourished, NAD Cardio: RRR, no m/r/g noted.  Pulm: No increased WOB.  Upper transmitted airway sounds. Inspiratory and wheezing bilaterally. No crackles noted. Back: Normal to inspection with well-healed surgical scars. Full range of motion with some improvement with extension. Patient notes tenderness over the spinous processes of the lumbar region in addition to the paraspinal muscles bilaterally. Negative straight leg raise. 4+ out of 5 strength in the lower extremities bilaterally. Sensation intact in the  lower external bilaterally. Normal pulses. 1+ DTRs bilaterally.  Neck: Normal chest infection. No pain in the cervical spine. Some pain and spasticity in the trapezius muscle on the right. 5 out of 5 shrink in the upper extremities bilaterally.  Sensation intact. Gait: Ambulates with a cane Assessment/Plan: HYPERTENSION, BENIGN SYSTEMIC Elevated secondary to noncompliance. We discussed need for compliance. -Patient to follow-up in 3 months or sooner as needed.  Back pain of lumbar region with sciatica Back pain with now worsening over the last few weeks. No red flags on exam or history. She states that the Toradol and steroids do not help at her last visit. Advised her to continue gabapentin and incidences needed. -Prescribed onetime dose of Percocet in the setting of worsening symptoms. -As patient has not had an MRI in several years and has not followed up with neurology, will have her follow-up with them. -Patient would benefit from physical therapy, possibly with McKenzie method given improvement in her symptoms with extension. -Discussed strict return precautions.  Neck pain on left side Patient actually endorsing pain on the right side today. Noted to have some spasticity on the trapezius muscle. No red flags on exam or history. -Continue with Flexeril and heat therapy. -Discussed stretching.  Wheezing: Patient is that she has had a history of asthma in the past. She takes albuterol intermittently, but has been out for the last month. Aggressively, I do not see asthma or COPD on her problem list. Her exam is consistent with this asthma exacerbation. She was given an treatment of Duonebs in the clinic with significant improvement in her breathing, and resolution of wheezing. Refill the patient's albuterol. Had her start taking prednisone for 5 day burst. When the patient improves, will have her obtain PFTs with Dr. Valentina Lucks.  Orders Placed This Encounter  Procedures  . BASIC METABOLIC PANEL WITH GFR  . Ambulatory referral to Neurology    Referral Priority:   Routine    Referral Type:   Consultation    Referral Reason:   Specialty Services Required    Requested Specialty:   Neurology    Number of Visits Requested:   1     Meds ordered this encounter  Medications  . predniSONE (DELTASONE) 50 MG tablet    Sig: Take 1 tablet (50 mg total) by mouth daily with breakfast.    Dispense:  5 tablet    Refill:  0  . oxyCODONE-acetaminophen (ROXICET) 5-325 MG tablet    Sig: Take 1 tablet by mouth every 8 (eight) hours as needed for severe pain.    Dispense:  20 tablet    Refill:  0  . hydrochlorothiazide (HYDRODIURIL) 50 MG tablet    Sig: Take 1 tablet (50 mg total) by mouth daily.    Dispense:  30 tablet    Refill:  1  . albuterol (PROVENTIL HFA;VENTOLIN HFA) 108 (90 Base) MCG/ACT inhaler    Sig: Inhale 1-2 puffs into the lungs every 6 (six) hours as needed for wheezing or shortness of breath.    Dispense:  1 Inhaler    Refill:  [facility] PGY-3, [place]

## 2016-04-04 NOTE — Assessment & Plan Note (Signed)
Patient actually endorsing pain on the right side today. Noted to have some spasticity on the trapezius muscle. No red flags on exam or history. -Continue with Flexeril and heat therapy. -Discussed stretching.

## 2016-04-04 NOTE — Assessment & Plan Note (Signed)
Elevated secondary to noncompliance. We discussed need for compliance. -Patient to follow-up in 3 months or sooner as needed.

## 2016-04-04 NOTE — Assessment & Plan Note (Signed)
Back pain with now worsening over the last few weeks. No red flags on exam or history. She states that the Toradol and steroids do not help at her last visit. Advised her to continue gabapentin and incidences needed. -Prescribed onetime dose of Percocet in the setting of worsening symptoms. -As patient has not had an MRI in several years and has not followed up with neurology, will have her follow-up with them. -Patient would benefit from physical therapy, possibly with McKenzie method given improvement in her symptoms with extension. -Discussed strict return precautions.

## 2016-04-05 ENCOUNTER — Encounter (HOSPITAL_COMMUNITY): Payer: Self-pay | Admitting: Family Medicine

## 2016-04-18 ENCOUNTER — Other Ambulatory Visit: Payer: Self-pay | Admitting: Family Medicine

## 2016-04-18 NOTE — Telephone Encounter (Signed)
Pt called to check the status of her request for a new for her percocet to be re-written since her granddaughter tore up the first one. Please let patient jw

## 2016-04-19 MED ORDER — OXYCODONE-ACETAMINOPHEN 5-325 MG PO TABS: 1.0000 | ORAL_TABLET | Freq: Three times a day (TID) | ORAL | 0 refills | Status: DC | PRN

## 2016-04-19 NOTE — Telephone Encounter (Signed)
2nd request. Pt would like to be called when it is ready for pick up. Please advise. Thanks! ep

## 2016-04-19 NOTE — Telephone Encounter (Signed)
Patient informed, expressed understanding. 

## 2016-04-19 NOTE — Telephone Encounter (Signed)
Please let the patient know that there is a new Rx at the front desk.    Thanks, Archie Patten, MD [facility] Resident  04/19/2016, 1:26 PM

## 2016-04-26 NOTE — Telephone Encounter (Signed)
LM x 2

## 2016-04-29 ENCOUNTER — Telehealth: Payer: Self-pay

## 2016-04-29 ENCOUNTER — Other Ambulatory Visit: Payer: Self-pay

## 2016-04-29 DIAGNOSIS — G4733 Obstructive sleep apnea (adult) (pediatric): Secondary | ICD-10-CM

## 2016-04-29 NOTE — Telephone Encounter (Signed)
Spoke with pt. About her sleep study per VS, Pt. Agreed to the order being placed for the CPAP. She understood to reach out to [place] once she receives it to get a follow up appointment. The order was placed. At this time nothing further is needed.

## 2016-05-01 ENCOUNTER — Encounter: Payer: Self-pay | Admitting: Family Medicine

## 2016-05-01 ENCOUNTER — Ambulatory Visit (INDEPENDENT_AMBULATORY_CARE_PROVIDER_SITE_OTHER): Payer: Commercial Managed Care - HMO | Admitting: Family Medicine

## 2016-05-01 VITALS — BP 158/100 | HR 93 | Temp 98.4°F | Ht 63.0 in | Wt 221.6 lb

## 2016-05-01 DIAGNOSIS — I10 Essential (primary) hypertension: Secondary | ICD-10-CM

## 2016-05-01 DIAGNOSIS — M542 Cervicalgia: Secondary | ICD-10-CM | POA: Diagnosis not present

## 2016-05-01 MED ORDER — LOSARTAN POTASSIUM 50 MG PO TABS: 50.0000 mg | ORAL_TABLET | Freq: Two times a day (BID) | ORAL | 1 refills | Status: DC

## 2016-05-01 MED ORDER — OXYCODONE-ACETAMINOPHEN 5-325 MG PO TABS: 1.0000 | ORAL_TABLET | Freq: Three times a day (TID) | ORAL | 0 refills | Status: DC | PRN

## 2016-05-01 NOTE — Assessment & Plan Note (Signed)
BP not controlled, asymptomatic.  - will increase losartan 50mg  to BID - continue coreg and HCTZ. - pt to f/u in 1 month.

## 2016-05-01 NOTE — Assessment & Plan Note (Signed)
Stable. Pt f/u with neurology with MRI planned in Jan per her report. Negative Spurling's on exam. Pain seems to be mostly in the trapezium muscle. No red flags on exam or history. - continue flexeril and heat. - Rx for Norco, #30 for break through pain. - continue with stretches.

## 2016-05-01 NOTE — Patient Instructions (Signed)
I have increased her losartan to 1 tablet twice daily. Continue the hydrochlorothiazide and Coreg as previously prescribed. Follow-up with me in one month or sooner as needed.  Please have the neurologist fax over the results from your MRI and any clinic notes.

## 2016-05-01 NOTE — Progress Notes (Signed)
Subjective: CC: follow up HPI: Patient is a 55 y.o. female with a past medical history of HTN, OSA, chronic lumbar pain, chronic left sided neck pain  presenting to clinic for continued chronic right-sided neck pain and f/u BPs  Neck pain: she has an appt with the neurologist in Jan 2018 for her neck pain. She's still having shooting/stabbing right lateral neck pain that is chronic. Movement sometimes makes it worse. Sometimes has numbness from neck to shoulder on the right. She notes some weakness in the R arm for the last 2-3 months, notes its mostly with abducting above 90.  States this has been going on "forever."  She states that the pain medication has been very beneficial. Also taking gabapentin. No new symptoms per her report.   Hypertension Blood pressure at home: doesn't check  Blood pressure today: HCTZ, Coreg, and losartan. She's forgotten to take it once in the last week.  Side effects: none ROS: Denies headache, dizziness, visual changes, nausea, vomiting, chest pain, abdominal pain or shortness of breath.   Social History: former smoker  ROS: All other systems reviewed and are negative.  Past Medical History Patient Active Problem List   Diagnosis Date Noted  . 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 Dizziness 02/21/2015  . 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 Myalgia 12/18/2014  . Routine adult health maintenance 07/20/2014  . Hamstring injury 12/22/2013  . Left shoulder pain 09/29/2013  . Bilateral edema of lower extremity 09/29/2013  . Chest pain, atypical 08/28/2012  . New onset of headaches 08/05/2012  . Neck pain 12/26/2011  . Obstructive sleep apnea 12/26/2011  . Fatigue 09/13/2011  . Prediabetes 07/18/2011  . Back pain of lumbar region with sciatica 04/26/2010  . Morbid obesity ([place]) 04/16/2010  . 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 GAIT DISTURBANCE 05/03/2009  . Allergic rhinitis 09/15/2008  . MENOPAUSAL SYNDROME 09/05/2008  . Hyperlipemia  06/12/2007  . HYPERTENSION, BENIGN SYSTEMIC 07/17/2006    Medications- reviewed and updated  Objective: Office vital signs reviewed. BP (!) 158/100   Pulse 93   Temp 98.4 F (36.9 C) (Oral)   Ht 5\' 3"  (1.6 m)   Wt 221 lb 9.6 oz (100.5 kg)   BMI 39.25 kg/m    Physical Examination:  General: Awake, alert, well- nourished, NAD Cardio: RRR, no m/r/g noted.  Pulm: No increased WOB.  Lungs CTAB. Neck: Normal to inspection. No pain in the cervical spine. Some pain and spasticity in the trapezius muscle on the right. 5 out of 5 strength in the upper extremities bilaterally. Sensation intact. Negative Spurling test.  Gait: Ambulates with a cane  Assessment/Plan: HYPERTENSION, BENIGN SYSTEMIC BP not controlled, asymptomatic.  - will increase losartan 50mg  to BID - continue coreg and HCTZ. - pt to f/u in 1 month.  Neck pain Stable. Pt f/u with neurology with MRI planned in Jan per her report. Negative Spurling's on exam. Pain seems to be mostly in the trapezium muscle. No red flags on exam or history. - continue flexeril and heat. - Rx for Norco, #30 for break through pain. - continue with stretches.   No orders of the defined types were placed in this encounter.   Meds ordered this encounter  Medications  . losartan (COZAAR) 50 MG tablet    Sig: Take 1 tablet (50 mg total) by mouth 2 (two) times daily.    Dispense:  60 tablet    Refill:  1  . oxyCODONE-acetaminophen (ROXICET) 5-325 MG tablet    Sig: Take  1 tablet by mouth every 8 (eight) hours as needed for severe pain.    Dispense:  30 tablet    Refill:  [facility] PGY-3, [place]

## 2016-05-10 NOTE — Telephone Encounter (Signed)
Spoke with pt and notified of results per Dr. Sood. Pt verbalized understanding and denied any questions. Order sent to [facility] 

## 2016-05-27 ENCOUNTER — Ambulatory Visit: Payer: Commercial Managed Care - HMO | Admitting: Diagnostic Neuroimaging

## 2016-05-29 ENCOUNTER — Encounter: Payer: Self-pay | Admitting: Diagnostic Neuroimaging

## 2016-05-29 ENCOUNTER — Ambulatory Visit (INDEPENDENT_AMBULATORY_CARE_PROVIDER_SITE_OTHER): Payer: Medicare HMO | Admitting: Diagnostic Neuroimaging

## 2016-05-29 VITALS — BP 162/97 | HR 68 | Wt 208.0 lb

## 2016-05-29 DIAGNOSIS — M5416 Radiculopathy, lumbar region: Secondary | ICD-10-CM | POA: Diagnosis not present

## 2016-05-29 DIAGNOSIS — Z6836 Body mass index (BMI) 36.0-36.9, adult: Secondary | ICD-10-CM | POA: Diagnosis not present

## 2016-05-29 DIAGNOSIS — M5412 Radiculopathy, cervical region: Secondary | ICD-10-CM | POA: Diagnosis not present

## 2016-05-29 NOTE — Progress Notes (Signed)
[facility]  PATIENT: Kristen [name] DOB: 1960-06-13  REFERRING CLINICIAN: Dorsey HISTORY FROM: patient  REASON FOR VISIT: follow up    HISTORICAL  CHIEF COMPLAINT:  Chief Complaint  Patient presents with  . Back Pain    rm 7, "chronic mid low back pain, neck pain, sciatica on left side"    HISTORY OF PRESENT ILLNESS:   UPDATE 05/29/16: Since last visit, patient did not pursue PT and pain mgmt clinic as I had ordered / referred her to because of transportation issues. Continues with neck pain radiating to right arm and low back pain radiating to legs. She does not want surgical or procedural treatment. She had some benefit with narcotic medications. She would like to go back to pain mgmt clinic now.  NEW HPI ([facility], 03/31/15): 56 year old female with chronic low back pain radiating to left leg since 2009. S/p surg in 2009 by ortho. No relief. On gabapentin ~2400mg  daily (divided doses). Not been to PT or pain mgmt recently. Also with pain in the right neck/head/occipital region x 6 months. Prior noted from old [place] system and EPIC reviewed.   PRIOR HPI ([place], [id number]): " 56 year old right-handed black female with a history of morbid obesity and low back pain. This patient has undergone lumbosacral spine surgery in the past in 2009. Patient claims that she was having significant back pain and difficulty walking prior to surgery, but after surgery the pain worsened. Patient claims that she has pain in the back, and the pain radiates into the legs to the ankle level. Patient also has numbness into the lower extremities as well. Patient feels as if the legs are weak. Patient denies problems controlling the bowels or the bladder. Patient also reports some right arm discomfort with shooting pain into the arm and occasional neck pain. Patient claims that she has had MRI of the lumbosacral spine done, and this was reviewed online. This shows significant degenerative changes at  the L5-S1 level with some marrow edema there is degenerative. Bilateral neuroforaminal stenosis is seen at several levels. Patient comes to this office for an evaluation. The original back surgery was done by Dr. Gladstone Lighter, and the patient was apparently told that no more surgery could be done. Patient comes to the office today for nerve conduction studies of both legs and EMG studies of both legs."   REVIEW OF SYSTEMS: Full 14 system review of systems performed and notable only for joint pain cramps memory loss insomnia headache restless legs weight gain fatigue.   ALLERGIES: Allergies  Allergen Reactions  . Amoxicillin Hives  . Penicillins Hives, Itching and Rash    HOME MEDICATIONS: Outpatient Medications Prior to Visit  Medication Sig Dispense Refill  . albuterol (PROVENTIL HFA;VENTOLIN HFA) 108 (90 Base) MCG/ACT inhaler Inhale 1-2 puffs into the lungs every 6 (six) hours as needed for wheezing or shortness of breath. 1 Inhaler 3  . aspirin 81 MG chewable tablet Chew 1 tablet (81 mg total) by mouth daily. 10 tablet 0  . carvedilol (COREG) 25 MG tablet take 1 tablet by mouth twice a day with meals 60 tablet 2  . cetirizine (ZYRTEC) 10 MG tablet Take 1 tablet (10 mg total) by mouth daily. (Patient taking differently: Take 10 mg by mouth daily as needed for allergies. ) 30 tablet 11  . cyclobenzaprine (FLEXERIL) 10 MG tablet Take 1 tablet (10 mg total) by mouth 3 (three) times daily as needed for muscle spasms. 90 tablet 0  . fluticasone (  FLONASE) 50 MCG/ACT nasal spray Place 2 sprays into both nostrils daily. (Patient taking differently: Place 2 sprays into both nostrils as needed. ) 16 g 6  . gabapentin (NEURONTIN) 300 MG capsule Take 2 capsules (600 mg total) by mouth 3 (three) times daily. 180 capsule 1  . hydrochlorothiazide (HYDRODIURIL) 50 MG tablet Take 1 tablet (50 mg total) by mouth daily. 30 tablet 1  . ibuprofen (ADVIL,MOTRIN) 800 MG tablet Take 1 tablet (800 mg total) by mouth 3  (three) times daily. (Patient taking differently: Take 800 mg by mouth as needed. ) 21 tablet 0  . losartan (COZAAR) 50 MG tablet Take 1 tablet (50 mg total) by mouth 2 (two) times daily. 60 tablet 1  . oxybutynin (DITROPAN) 5 MG tablet Take 0.5 tablets (2.5 mg total) by mouth daily. 30 tablet 2  . oxyCODONE-acetaminophen (ROXICET) 5-325 MG tablet Take 1 tablet by mouth every 8 (eight) hours as needed for severe pain. 30 tablet 0  . pantoprazole (PROTONIX) 20 MG tablet Take 1 tablet (20 mg total) by mouth daily. 30 tablet 2   Facility-Administered Medications Prior to Visit  Medication Dose Route Frequency Provider Last Rate Last Dose  . 0.9 %  sodium chloride infusion  500 mL Intravenous Continuous Doran Stabler, MD        PAST MEDICAL HISTORY: Past Medical History:  Diagnosis Date  . Allergy   . Back pain    Seen by Neurosurery  . Hyperlipidemia   . Hypertension   . Leg pain    Bil  . Obesity   . Peripheral neuropathy ([place])   . Post-menopausal   . Sleep apnea    no cpap    PAST SURGICAL HISTORY: Past Surgical History:  Procedure Laterality Date  . carpel tunnel release Bilateral   . COLONOSCOPY    . LAMINECTOMY  01/20/2008   L4-L5 by Dr Latanya Maudlin (Ortho), [facility]  . POLYPECTOMY    . TUBAL LIGATION      FAMILY HISTORY: Family History  Problem Relation Age of Onset  . Lupus Mother   . Heart disease Mother   . Anemia Father   . Hypertension Father   . Arthritis Brother   . Asthma Son   . Lupus Daughter   . Colon cancer Neg Hx   . Colon polyps Neg Hx   . Esophageal cancer Neg Hx   . Rectal cancer Neg Hx   . Stomach cancer Neg Hx     SOCIAL HISTORY:  Social History   Social History  . Marital status: Legally Separated    Spouse name: N/A  . Number of children: 3  . Years of education: 44   Occupational History  . Disabled    Social History Main Topics  . Smoking status: Former Smoker    Packs/day: 0.25    Years: 0.50     Types: Cigarettes    Quit date: 12/19/1990  . Smokeless tobacco: Never Used     Comment: Quit 25 years ago.  . Alcohol use No     Comment: 05/29/16 none  . Drug use:     Frequency: 4.0 times per week    Types: Marijuana     Comment: Helps with back pain, 05/29/16 three x day  . Sexual activity: Yes    Birth control/ protection: Post-menopausal   Other Topics Concern  . Not on file   Social History Narrative   Lives at home with her granddaughter, Angelica Pou, and daughter  Right-handed.   Occasional use of caffeine.     PHYSICAL EXAM  GENERAL EXAM/CONSTITUTIONAL: Vitals:  Vitals:   05/29/16 1445  BP: (!) 162/97  Pulse: 68  Weight: 208 lb (94.3 kg)   Body mass index is 36.85 kg/m. No exam data present  Patient is in no distress; well developed, nourished and groomed; neck is supple  CARDIOVASCULAR:  Examination of carotid arteries is normal; no carotid bruits  Regular rate and rhythm, no murmurs  Examination of peripheral vascular system by observation and palpation is normal  EYES:  Ophthalmoscopic exam of optic discs and posterior segments is normal; no papilledema or hemorrhages  MUSCULOSKELETAL:  Gait, strength, tone, movements noted in Neurologic exam below  NEUROLOGIC: MENTAL STATUS:  No flowsheet data found.  awake, alert, oriented to person, place and time  recent and remote memory intact  normal attention and concentration  language fluent, comprehension intact, naming intact,   fund of knowledge appropriate  CRANIAL NERVE:   2nd - no papilledema on fundoscopic exam  2nd, 3rd, 4th, 6th - pupils equal and reactive to light, visual fields full to confrontation, extraocular muscles intact, no nystagmus  5th - facial sensation symmetric  7th - facial strength symmetric  8th - hearing intact  9th - palate elevates symmetrically, uvula midline  11th - shoulder shrug symmetric  12th - tongue protrusion midline  MOTOR:   normal bulk  and tone; BUE 4; BLE 3-4  SENSORY:   normal and symmetric to light touch, temperature, vibration   COORDINATION:   finger-nose-finger, fine finger movements normal  REFLEXES:   deep tendon reflexes TRACE and symmetric; ABSENT AT ANKLES  GAIT/STATION:   narrow based gait; WADDLING GAIT; UNSTEADY AND SLOW; ANTALGIC GAIT    DIAGNOSTIC DATA (LABS, IMAGING, TESTING) - I reviewed patient records, labs, notes, testing and imaging myself where available.  Lab Results  Component Value Date   WBC 8.0 09/07/2015   HGB 14.0 09/07/2015   HCT 42.3 09/07/2015   MCV 82.8 09/07/2015   PLT 250 09/07/2015      Component Value Date/Time   NA 140 04/03/2016 1146   K 4.4 04/03/2016 1146   CL 104 04/03/2016 1146   CO2 28 04/03/2016 1146   GLUCOSE 91 04/03/2016 1146   BUN 13 04/03/2016 1146   CREATININE 1.00 04/03/2016 1146   CALCIUM 9.5 04/03/2016 1146   PROT 6.7 02/21/2015 1447   ALBUMIN 3.5 (L) 02/21/2015 1447   AST 21 02/21/2015 1447   ALT 16 02/21/2015 1447   ALKPHOS 89 02/21/2015 1447   BILITOT 0.4 02/21/2015 1447   GFRNONAA 64 04/03/2016 1146   GFRAA 74 04/03/2016 1146   Lab Results  Component Value Date   CHOL 225 (H) 07/19/2014   HDL 45 (L) 07/19/2014   LDLCALC 161 (H) 07/19/2014   LDLDIRECT 76 10/28/2013   TRIG 94 07/19/2014   CHOLHDL 5.0 07/19/2014   Lab Results  Component Value Date   HGBA1C 5.0 06/22/2013   Lab Results  Component Value Date   VITAMINB12 491 04/16/2010   Lab Results  Component Value Date   TSH 1.850 09/13/2011    04/06/14 CT head  - normal  02/22/12 MRI cervical spine 1. Spondylosis and a right foraminal disc osteophyte complex at C5-C6 contribute to mild cord flattening and moderate right foraminal stenosis. 2. Shallow posterolateral disc protrusion on the right C4-C5 without cord deformity or significant foraminal stenosis.  3. Mild to moderate biforaminal stenosis at C3-C4. 4. Asymmetric facet hypertrophy on  the left at  C2-C3.  08/07/10 EMG/NCS  - Nerve conduction studies on both legs were normal. No evidence of a peripheral neuropathy is seen. EMG study of the right leg was essentially normal, without evidence of a lumbosacral radiculopathy. EMG of the left lower extremity showed findings consistent with an acute left L5 radiculopathy.  04/29/10 MRI lumbar spine  1. Endplate marrow edema and mild prevertebral stranding at L5-S1. Favor degenerative etiology over diskitis osteomyelitis, but infection should be considered - especially if the patient is diabetic and/or immunocompromised. 2. Postoperative changes at L4-L5 and L5-S1 with improved patency of the thecal sac at both levels. However, mass effect on the left S1 nerve root persists at L5-S1, and mild spinal stenosis with moderate to severe bilateral foraminal stenosis persist at L4-L5. See above details. 3. Moderate facet degeneration in the upper lumbar spine.    ASSESSMENT AND PLAN  55 y.o. year old female here with chronic low back pain radiating to the left leg since 2009. Also with right head/neck pain x 6 months. On gabapentin 2400mg  daily. Patient not interested in further surgical evaluations. No further imaging indicated at this time.   Dx:  Lumbar radiculopathy, chronic  Right cervical radiculopathy  BMI 36.0-36.9,adult    PLAN: - patient declines surgical or interventional pain treatments; therefore, no role for further neuroimaging at this time - consider PT and pain mgmt clinic referrals; patient may ask her PCP for referrals if needed - encouraged patient to improve nutrition and physical activity  Return if symptoms worsen or fail to improve, for return to PCP.    Penni Bombard, MD [id number], [id number] PM Certified in Neurology, Neurophysiology and Neuroimaging  [facility] [address] [place], [place] [address] [phone]

## 2016-05-30 ENCOUNTER — Encounter: Payer: Self-pay | Admitting: Family Medicine

## 2016-05-30 ENCOUNTER — Ambulatory Visit (INDEPENDENT_AMBULATORY_CARE_PROVIDER_SITE_OTHER): Payer: Medicare HMO | Admitting: Family Medicine

## 2016-05-30 VITALS — BP 158/100 | HR 75 | Temp 97.9°F | Ht 63.0 in | Wt 219.0 lb

## 2016-05-30 DIAGNOSIS — M79605 Pain in left leg: Secondary | ICD-10-CM

## 2016-05-30 DIAGNOSIS — R0789 Other chest pain: Secondary | ICD-10-CM

## 2016-05-30 DIAGNOSIS — M5440 Lumbago with sciatica, unspecified side: Secondary | ICD-10-CM | POA: Diagnosis not present

## 2016-05-30 DIAGNOSIS — I10 Essential (primary) hypertension: Secondary | ICD-10-CM

## 2016-05-30 DIAGNOSIS — M79604 Pain in right leg: Secondary | ICD-10-CM

## 2016-05-30 MED ORDER — HYDROCHLOROTHIAZIDE 25 MG PO TABS: 25.0000 mg | ORAL_TABLET | Freq: Every day | ORAL | 1 refills | Status: DC

## 2016-05-30 MED ORDER — OXYCODONE-ACETAMINOPHEN 5-325 MG PO TABS: 1.0000 | ORAL_TABLET | Freq: Three times a day (TID) | ORAL | 0 refills | Status: DC | PRN

## 2016-05-30 MED ORDER — GABAPENTIN 300 MG PO CAPS: ORAL_CAPSULE | ORAL | 1 refills | Status: DC

## 2016-05-30 NOTE — Patient Instructions (Signed)
Nice to meet you today. We will change her blood pressure medications. The doses of HCTZ will be decreased to 25 mg daily to see if this helps with the cramping.  I refer you to physical therapy and pain management. He did not hear back about these referrals in the next 2 weeks, please call and let us know. We will change her gabapentin dose to 600 mg every afternoon and 900 mg at night. A small amount of Percocet was prescribed for you to use for excruciating pain. This is not a medicine that he should be on long-term as we discussed.  Please follow-up in 2 weeks with your primary care doctor to discuss your blood pressure and your pain.  Take care, Dr. Jacinto Reap

## 2016-05-30 NOTE — Progress Notes (Signed)
Subjective:   Kristen [name] is a 56 y.o. female with a history of HTN, HLD, statin myopathy, morbid obesity, chronic lower back pain here for pain and hypertension follow-up  HTN: - Was seen in 05/01/16 by PCP and with certain dose was increased to 50 mg twice a day as her blood pressure was not controlled - Medications: Losartan 50 mg twice a day, Coreg 25 mg twice a day, HCTZ 50 mg daily - stopped taking HCTZ 1 month ago due to cramping - never taken 50mg  daily - Checking BP at home: no - Denies any SOB, vision changes, LE edema, medication SEs, or symptoms of hypotension - Diet: avoids excess sodium, no canned foods or fast food - Exercise: walking daily 20-30 min - feels like it is elevated due to pain - intermittent chronic CP for >1 yr - occurs intermittently at rest - self-resolved - advised in 2016 to see Cardiology for stress test but CP was thought to be MSK - no CP currently  Chronic LBP related to lumbar radiculopathy - Was seen by neurology yesterday - as patient declined surgical or interventional pain management no role for further neuro imaging - Was advised to talk to PCP about physical therapy and pain management clinic referrals - water aerobics seemed to help - stopped for winter - taking gabapentin 600mg  BID - makes her fatigued to take more frquently - also takes flexeril - was prescribed percocet  5-325mg  q8h prn - helped with function and walking  - PT helped in the past - denies weakness, numbness, incontinence, fevers - usually walks with cane  Review of Systems:  Per HPI.   Social History: Former smoker  Objective:  BP (!) 158/100   Pulse 75   Temp 97.9 F (36.6 C) (Oral)   Ht 5\' 3"  (1.6 m)   Wt 219 lb (99.3 kg)   BMI 38.79 kg/m   Gen:  56 y.o. female in NAD  HEENT: NCAT, MMM, EOMI, PERRL, anicteric sclerae CV: RRR, no MRG, +Chest tenderness to palpation Resp: Non-labored, CTAB, no wheezes noted Ext: WWP, no edema MSK: Limited range  of motion for lower back in all directions, no midline tenderness to palpation, strength and sensation intact in lower extremities Skin: no rashes noted Neuro: Alert and oriented, speech normal       Chemistry      Component Value Date/Time   NA 140 04/03/2016 1146   K 4.4 04/03/2016 1146   CL 104 04/03/2016 1146   CO2 28 04/03/2016 1146   BUN 13 04/03/2016 1146   CREATININE 1.00 04/03/2016 1146      Component Value Date/Time   CALCIUM 9.5 04/03/2016 1146   ALKPHOS 89 02/21/2015 1447   AST 21 02/21/2015 1447   ALT 16 02/21/2015 1447   BILITOT 0.4 02/21/2015 1447      Lab Results  Component Value Date   WBC 8.0 09/07/2015   HGB 14.0 09/07/2015   HCT 42.3 09/07/2015   MCV 82.8 09/07/2015   PLT 250 09/07/2015   Lab Results  Component Value Date   TSH 1.850 09/13/2011   Lab Results  Component Value Date   HGBA1C 5.0 06/22/2013   Assessment & Plan:     [name] is a 56 y.o. female here for   HYPERTENSION, BENIGN SYSTEMIC Uncontrolled today Patient has stopped taking HCTZ due to cramping Recent BMP within normal Decrease HCTZ dose to 25 mg daily to help with side effects Continue other medications follow-up  in 2 weeks  Morbid obesity Advised on diet and exercise  Chest pain, atypical Likely musculoskeletal Comes on at rest and is reproducible on exam Patient is at risk for CAD however Again encourage patient to follow-up with cardiology for stress test Return precautions discussed  Back pain of lumbar region with sciatica Chronic lower back pain No red flags on exam or history Patient unable to tolerate 3 times a day gabapentin, so we'll increase nighttime dose to 900 mg Referrals to physical therapy and pain management ordered today Neurology Yesterday with No Follow-Up Recommended Long Discussion regarding Problems with Long-Term Narcotics Decreased Number of Percocet Prescribed to 15 Advised to follow-up in 2 weeks and consider Cymbalta at  that time while awaiting pain management appointment   Virginia Crews, MD MPH PGY-3,  [facility] 05/30/2016  12:08 PM

## 2016-05-30 NOTE — Assessment & Plan Note (Signed)
Likely musculoskeletal Comes on at rest and is reproducible on exam Patient is at risk for CAD however Again encourage patient to follow-up with cardiology for stress test Return precautions discussed

## 2016-05-30 NOTE — Assessment & Plan Note (Signed)
Chronic lower back pain No red flags on exam or history Patient unable to tolerate 3 times a day gabapentin, so we'll increase nighttime dose to 900 mg Referrals to physical therapy and pain management ordered today Neurology Yesterday with No Follow-Up Recommended Long Discussion regarding Problems with Long-Term Narcotics Decreased Number of Percocet Prescribed to 15 Advised to follow-up in 2 weeks and consider Cymbalta at that time while awaiting pain management appointment

## 2016-05-30 NOTE — Assessment & Plan Note (Signed)
Advised on diet and exercise. 

## 2016-05-30 NOTE — Assessment & Plan Note (Signed)
Uncontrolled today Patient has stopped taking HCTZ due to cramping Recent BMP within normal Decrease HCTZ dose to 25 mg daily to help with side effects Continue other medications follow-up in 2 weeks

## 2016-06-17 ENCOUNTER — Ambulatory Visit: Payer: Medicare HMO | Admitting: Family Medicine

## 2016-07-04 ENCOUNTER — Encounter: Payer: Self-pay | Admitting: Physical Medicine & Rehabilitation

## 2016-07-11 ENCOUNTER — Other Ambulatory Visit: Payer: Self-pay | Admitting: Family Medicine

## 2016-07-12 ENCOUNTER — Encounter: Payer: Medicare HMO | Admitting: Physical Medicine & Rehabilitation

## 2016-07-15 ENCOUNTER — Ambulatory Visit: Payer: Medicare HMO | Admitting: Family Medicine

## 2016-07-15 NOTE — Progress Notes (Deleted)
   Subjective:   Kristen [name] is a 56 y.o. female with a history of *** here for ***  ***  HTN: - Medications: Losartan 50 mg twice a day, Coreg 25 mg twice a day, HCTZ 25 mg daily - Was seen 05/30/16 and placed on HCTZ 25 mg daily for better BP control - had previously been prescribed 50 mg daily, but had stopped one month prior due to cramping - Compliance: *** - Checking BP at home: *** - Denies any SOB, CP, vision changes, LE edema, medication SEs, or symptoms of hypotension - Diet: *** - Exercise: ***   Review of Systems:  Per HPI.   Social History: *** smoker  Objective:  There were no vitals taken for this visit.  Gen:  56 y.o. female in NAD *** HEENT: NCAT, MMM, EOMI, PERRL, anicteric sclerae CV: RRR, no MRG, no JVD Resp: Non-labored, CTAB, no wheezes noted Abd: Soft, NTND, BS present, no guarding or organomegaly Ext: WWP, no edema MSK: Full ROM, strength intact Neuro: Alert and oriented, speech normal       Chemistry      Component Value Date/Time   NA 140 04/03/2016 1146   K 4.4 04/03/2016 1146   CL 104 04/03/2016 1146   CO2 28 04/03/2016 1146   BUN 13 04/03/2016 1146   CREATININE 1.00 04/03/2016 1146      Component Value Date/Time   CALCIUM 9.5 04/03/2016 1146   ALKPHOS 89 02/21/2015 1447   AST 21 02/21/2015 1447   ALT 16 02/21/2015 1447   BILITOT 0.4 02/21/2015 1447      Lab Results  Component Value Date   WBC 8.0 09/07/2015   HGB 14.0 09/07/2015   HCT 42.3 09/07/2015   MCV 82.8 09/07/2015   PLT 250 09/07/2015   Lab Results  Component Value Date   TSH 1.850 09/13/2011   Lab Results  Component Value Date   HGBA1C 5.0 06/22/2013   Assessment & Plan:     Kristen [name] is a 56 y.o. female here for ***  No problem-specific Assessment & Plan notes found for this encounter.     Virginia Crews, MD MPH PGY-3,  [facility] 07/15/2016  9:10 AM

## 2016-08-02 ENCOUNTER — Encounter: Payer: Medicare HMO | Admitting: Physical Medicine & Rehabilitation

## 2016-08-06 ENCOUNTER — Other Ambulatory Visit: Payer: Self-pay | Admitting: Family Medicine

## 2016-08-06 DIAGNOSIS — Z1231 Encounter for screening mammogram for malignant neoplasm of breast: Secondary | ICD-10-CM

## 2016-08-08 ENCOUNTER — Ambulatory Visit
Admission: RE | Admit: 2016-08-08 | Discharge: 2016-08-08 | Disposition: A | Discharge status: Home / Self Care | Payer: Medicare HMO | Source: Ambulatory Visit | Attending: Family Medicine | Admitting: Family Medicine

## 2016-08-08 DIAGNOSIS — Z1231 Encounter for screening mammogram for malignant neoplasm of breast: Secondary | ICD-10-CM | POA: Diagnosis not present

## 2016-08-19 ENCOUNTER — Emergency Department (HOSPITAL_COMMUNITY)
Admission: EM | Admit: 2016-08-19 | Discharge: 2016-08-19 | Disposition: A | Discharge status: Home / Self Care | Payer: Medicare HMO | Attending: Emergency Medicine | Admitting: Emergency Medicine

## 2016-08-19 ENCOUNTER — Encounter (HOSPITAL_COMMUNITY): Payer: Self-pay | Admitting: Emergency Medicine

## 2016-08-19 DIAGNOSIS — Z7982 Long term (current) use of aspirin: Secondary | ICD-10-CM | POA: Insufficient documentation

## 2016-08-19 DIAGNOSIS — R197 Diarrhea, unspecified: Secondary | ICD-10-CM | POA: Insufficient documentation

## 2016-08-19 DIAGNOSIS — Z79899 Other long term (current) drug therapy: Secondary | ICD-10-CM | POA: Diagnosis not present

## 2016-08-19 DIAGNOSIS — Z87891 Personal history of nicotine dependence: Secondary | ICD-10-CM | POA: Diagnosis not present

## 2016-08-19 DIAGNOSIS — I10 Essential (primary) hypertension: Secondary | ICD-10-CM | POA: Diagnosis not present

## 2016-08-19 DIAGNOSIS — R112 Nausea with vomiting, unspecified: Secondary | ICD-10-CM | POA: Insufficient documentation

## 2016-08-19 LAB — COMPREHENSIVE METABOLIC PANEL
ALK PHOS: 84 U/L (ref 38–126)
ALT: 17 U/L (ref 14–54)
AST: 24 U/L (ref 15–41)
Albumin: 3.5 g/dL (ref 3.5–5.0)
Anion gap: 4 — ABNORMAL LOW (ref 5–15)
BUN: 10 mg/dL (ref 6–20)
CALCIUM: 8.7 mg/dL — AB (ref 8.9–10.3)
CHLORIDE: 103 mmol/L (ref 101–111)
CO2: 32 mmol/L (ref 22–32)
CREATININE: 1.01 mg/dL — AB (ref 0.44–1.00)
GFR calc Af Amer: >60 mL/min (ref >60)
Glucose, Bld: 106 mg/dL — ABNORMAL HIGH (ref 65–99)
Potassium: 3.8 mmol/L (ref 3.5–5.1)
Sodium: 139 mmol/L (ref 135–145)
Total Bilirubin: 0.5 mg/dL (ref 0.3–1.2)
Total Protein: 6.8 g/dL (ref 6.5–8.1)

## 2016-08-19 LAB — CBC
HEMATOCRIT: 42 % (ref 36.0–46.0)
HEMOGLOBIN: 13.4 g/dL (ref 12.0–15.0)
MCH: 27.3 pg (ref 26.0–34.0)
MCHC: 31.9 g/dL (ref 30.0–36.0)
MCV: 85.7 fL (ref 78.0–100.0)
Platelets: 285 10*3/uL (ref 150–400)
RBC: 4.9 MIL/uL (ref 3.87–5.11)
RDW: 15.5 % (ref 11.5–15.5)
WBC: 7.3 10*3/uL (ref 4.0–10.5)

## 2016-08-19 LAB — URINALYSIS, ROUTINE W REFLEX MICROSCOPIC
BILIRUBIN URINE: NEGATIVE
Glucose, UA: NEGATIVE mg/dL
Hgb urine dipstick: NEGATIVE
KETONES UR: NEGATIVE mg/dL
Leukocytes, UA: NEGATIVE
NITRITE: NEGATIVE
PH: 6 (ref 5.0–8.0)
Protein, ur: NEGATIVE mg/dL
SPECIFIC GRAVITY, URINE: 1.006 (ref 1.005–1.030)

## 2016-08-19 LAB — LIPASE, BLOOD: LIPASE: 36 U/L (ref 11–51)

## 2016-08-19 MED ORDER — DIPHENOXYLATE-ATROPINE 2.5-0.025 MG PO TABS: 2.0000 | ORAL_TABLET | Freq: Four times a day (QID) | ORAL | 0 refills | Status: DC | PRN

## 2016-08-19 MED ORDER — SODIUM CHLORIDE 0.9 % IV BOLUS (SEPSIS)
2000.0000 mL | Freq: Once | INTRAVENOUS | Status: AC
  Administered 2016-08-19: 2000 mL via INTRAVENOUS

## 2016-08-19 MED ORDER — ONDANSETRON 8 MG PO TBDP: 8.0000 mg | ORAL_TABLET | Freq: Three times a day (TID) | ORAL | 0 refills | Status: DC | PRN

## 2016-08-19 MED ORDER — SODIUM CHLORIDE 0.9 % IV SOLN
INTRAVENOUS | Status: DC
  Administered 2016-08-19: 17:00:00 via INTRAVENOUS

## 2016-08-19 MED ORDER — ONDANSETRON HCL 4 MG/2ML IJ SOLN
4.0000 mg | Freq: Once | INTRAMUSCULAR | Status: AC
  Administered 2016-08-19: 4 mg via INTRAVENOUS
  Filled 2016-08-19: qty 2

## 2016-08-19 NOTE — ED Notes (Signed)
Fluids given to patient 

## 2016-08-19 NOTE — ED Notes (Signed)
Patient is A & O x4.  Patient understood AVS papers, no question asked.

## 2016-08-19 NOTE — ED Provider Notes (Signed)
[facility] DEPT Provider Note   CSN: [id number] Arrival date & time: 08/19/16  1148     History   Chief Complaint Chief Complaint  Patient presents with  . Emesis  . Diarrhea    HPI Kristen [name] is a 56 y.o. female.  56 y/o female here w/ sudden onset of watery diarrhea and non bilious emesis that started about 4 hours ago No associated fever, chills, abd pain, urinary sx No recent uri sx No recent travel or bad food No recent medication changes nothing made her sx better or worse Sx resolved w/o tx and no prior h/o same      Past Medical History:  Diagnosis Date  . Allergy   . Back pain    Seen by Neurosurery  . Hyperlipidemia   . Hypertension   . Leg pain    Bil  . Obesity   . Peripheral neuropathy ([place])   . Post-menopausal   . Sleep apnea    no cpap    Patient Active Problem List   Diagnosis Date Noted  . 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 Dizziness 02/21/2015  . 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 Myalgia 12/18/2014  . Routine adult health maintenance 07/20/2014  . Hamstring injury 12/22/2013  . Left shoulder pain 09/29/2013  . Bilateral edema of lower extremity 09/29/2013  . Chest pain, atypical 08/28/2012  . New onset of headaches 08/05/2012  . Neck pain 12/26/2011  . Obstructive sleep apnea 12/26/2011  . Fatigue 09/13/2011  . Prediabetes 07/18/2011  . Back pain of lumbar region with sciatica 04/26/2010  . Morbid obesity ([place]) 04/16/2010  . 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 GAIT DISTURBANCE 05/03/2009  . Allergic rhinitis 09/15/2008  . MENOPAUSAL SYNDROME 09/05/2008  . Hyperlipemia 06/12/2007  . HYPERTENSION, BENIGN SYSTEMIC 07/17/2006    Past Surgical History:  Procedure Laterality Date  . carpel tunnel release Bilateral   . COLONOSCOPY    . LAMINECTOMY  01/20/2008   L4-L5 by Dr Latanya Maudlin (Ortho), [facility]  . POLYPECTOMY    . TUBAL LIGATION      OB History    No data available        Home Medications    Prior to Admission medications   Medication Sig Start Date End Date Taking? Authorizing Provider  albuterol (PROVENTIL HFA;VENTOLIN HFA) 108 (90 Base) MCG/ACT inhaler Inhale 1-2 puffs into the lungs every 6 (six) hours as needed for wheezing or shortness of breath. 04/03/16   Archie Patten, MD  aspirin 81 MG chewable tablet Chew 1 tablet (81 mg total) by mouth daily. 09/07/15   Leo Grosser, MD  carvedilol (COREG) 25 MG tablet take 1 tablet by mouth twice a day with meals 12/21/15   Archie Patten, MD  carvedilol (COREG) 25 MG tablet take 1 tablet by mouth twice a day with meals 07/11/16   Veatrice Bourbon, MD  cetirizine (ZYRTEC) 10 MG tablet Take 1 tablet (10 mg total) by mouth daily. Patient taking differently: Take 10 mg by mouth daily as needed for allergies.  02/21/15   Archie Patten, MD  cyclobenzaprine (FLEXERIL) 10 MG tablet Take 1 tablet (10 mg total) by mouth 3 (three) times daily as needed for muscle spasms. 02/20/16    Bing, DO  fluticasone (FLONASE) 50 MCG/ACT nasal spray Place 2 sprays into both nostrils daily. Patient taking differently: Place 2 sprays into both nostrils as needed.  02/21/15   Archie Patten, MD  gabapentin (NEURONTIN) 300 MG capsule Take  600mg  every afternoon and 900mg  qhs 05/30/16   Virginia Crews, MD  hydrochlorothiazide (HYDRODIURIL) 25 MG tablet Take 1 tablet (25 mg total) by mouth daily. 05/30/16   Virginia Crews, MD  ibuprofen (ADVIL,MOTRIN) 800 MG tablet Take 1 tablet (800 mg total) by mouth 3 (three) times daily. Patient taking differently: Take 800 mg by mouth as needed.  07/09/15   Nona Dell, PA-C  losartan (COZAAR) 50 MG tablet Take 1 tablet (50 mg total) by mouth 2 (two) times daily. 05/01/16   Archie Patten, MD  oxybutynin (DITROPAN) 5 MG tablet Take 0.5 tablets (2.5 mg total) by mouth daily. 02/20/16   Naper Bing, DO  oxyCODONE-acetaminophen (ROXICET) 5-325 MG tablet Take 1 tablet  by mouth every 8 (eight) hours as needed for severe pain. 05/30/16   Virginia Crews, MD  pantoprazole (PROTONIX) 20 MG tablet Take 1 tablet (20 mg total) by mouth daily. 02/20/16   [place] Bing, DO    Family History Family History  Problem Relation Age of Onset  . Lupus Mother   . Heart disease Mother   . Anemia Father   . Hypertension Father   . Arthritis Brother   . Asthma Son   . Lupus Daughter   . Colon cancer Neg Hx   . Colon polyps Neg Hx   . Esophageal cancer Neg Hx   . Rectal cancer Neg Hx   . Stomach cancer Neg Hx   . Breast cancer Neg Hx     Social History Social History  Substance Use Topics  . Smoking status: Former Smoker    Packs/day: 0.25    Years: 0.50    Types: Cigarettes    Quit date: 12/19/1990  . Smokeless tobacco: Never Used     Comment: Quit 25 years ago.  . Alcohol use No     Comment: 05/29/16 none     Allergies   Amoxicillin and Penicillins   Review of Systems Review of Systems  All other systems reviewed and are negative.    Physical Exam Updated Vital Signs BP 129/88 (BP Location: Right Arm)   Pulse 76   Temp 98.1 F (36.7 C) (Oral)   Resp 18   SpO2 94%   Physical Exam  Constitutional: She is oriented to person, place, and time. She appears well-developed and well-nourished.  Non-toxic appearance. No distress.  HENT:  Head: Normocephalic and atraumatic.  Eyes: Conjunctivae, EOM and lids are normal. Pupils are equal, round, and reactive to light.  Neck: Normal range of motion. Neck supple. No tracheal deviation present. No thyroid mass present.  Cardiovascular: Normal rate, regular rhythm and normal heart sounds.  Exam reveals no gallop.   No murmur heard. Pulmonary/Chest: Effort normal and breath sounds normal. No stridor. No respiratory distress. She has no decreased breath sounds. She has no wheezes. She has no rhonchi. She has no rales.  Abdominal: Soft. Normal appearance and bowel sounds are normal. She exhibits no  distension. There is no tenderness. There is no rebound and no CVA tenderness.  Musculoskeletal: Normal range of motion. She exhibits no edema or tenderness.  Neurological: She is alert and oriented to person, place, and time. She has normal strength. No cranial nerve deficit or sensory deficit. GCS eye subscore is 4. GCS verbal subscore is 5. GCS motor subscore is 6.  Skin: Skin is warm and dry. No abrasion and no rash noted.  Psychiatric: She has a normal mood and affect. Her speech is normal  and behavior is normal.  Nursing note and vitals reviewed.    ED Treatments / Results  Labs (all labs ordered are listed, but only abnormal results are displayed) Labs Reviewed  COMPREHENSIVE METABOLIC PANEL - Abnormal; Notable for the following:       Result Value   Glucose, Bld 106 (*)    Creatinine, Ser 1.01 (*)    Calcium 8.7 (*)    Anion gap 4 (*)    All other components within normal limits  LIPASE, BLOOD  CBC  URINALYSIS, ROUTINE W REFLEX MICROSCOPIC    EKG  EKG Interpretation None       Radiology No results found.  Procedures Procedures (including critical care time)  Medications Ordered in ED Medications  0.9 %  sodium chloride infusion (not administered)  sodium chloride 0.9 % bolus 2,000 mL (not administered)  ondansetron (ZOFRAN) injection 4 mg (not administered)     Initial Impression / Assessment and Plan / ED Course  I have reviewed the triage vital signs and the nursing notes.  Pertinent labs & imaging results that were available during my care of the patient were reviewed by me and considered in my medical decision making (see chart for details).     Patient hydrated here and give anti-medics and feels better. Suspect gastroenteritis and stable for discharge.  Final Clinical Impressions(s) / ED Diagnoses   Final diagnoses:  None    New Prescriptions New Prescriptions   No medications on file     Lacretia Leigh, MD 08/19/16 1905

## 2016-08-19 NOTE — ED Triage Notes (Signed)
Pt c/o diarrhea, weakness, nausea, chills, swelling to bilateral cheeks "like two little balls" over parotid gland area, now resolved, onset today.

## 2016-08-26 ENCOUNTER — Ambulatory Visit (INDEPENDENT_AMBULATORY_CARE_PROVIDER_SITE_OTHER): Payer: Medicare HMO | Admitting: Student

## 2016-08-26 ENCOUNTER — Encounter: Payer: Self-pay | Admitting: Student

## 2016-08-26 DIAGNOSIS — J069 Acute upper respiratory infection, unspecified: Secondary | ICD-10-CM | POA: Diagnosis not present

## 2016-08-26 DIAGNOSIS — B9789 Other viral agents as the cause of diseases classified elsewhere: Secondary | ICD-10-CM | POA: Diagnosis not present

## 2016-08-26 NOTE — Assessment & Plan Note (Signed)
History and exam consistent with viral URI - continue conservative treatment - discussed OTC remedies - return precautions discussed

## 2016-08-26 NOTE — Patient Instructions (Signed)
Take Mucinex or alka seltzer cold and flu for congestion Try vicks vapo rub If you have worsening symptoms return to the office

## 2016-08-26 NOTE — Progress Notes (Signed)
   Subjective:    Patient ID: Kristen [name], female    DOB: 11/22/1960, 56 y.o.   MRN: [id number]   CC: congestion  HPI: 56 y/o F presents for congestion  Congestion - had a cold 2 weeks ago - feels her symptoms have largely resolved but still feels congested - has night time cough productive of clear sputum - no fevers - no SOB  Smoking status reviewed - smokes marijuana  Review of Systems  Per HPI, chest pain  Objective:  BP (!) 148/90   Pulse 78   Temp 98.5 F (36.9 C) (Oral)   Wt 210 lb (95.3 kg)   SpO2 97%   BMI 37.20 kg/m  Vitals and nursing note reviewed  General: NAD Cardiac: RRR,  Respiratory: CTAB, normal effort. Skin: warm and dry, no rashes noted Neuro: alert and oriented, no focal deficits   Assessment & Plan:    Viral URI History and exam consistent with viral URI - continue conservative treatment - discussed OTC remedies - return precautions discussed    Morine Kohlman A. Lincoln Brigham MD, [facility] Resident PGY-3 Pager [phone]

## 2016-10-18 ENCOUNTER — Other Ambulatory Visit: Payer: Self-pay | Admitting: Family Medicine

## 2016-10-18 ENCOUNTER — Other Ambulatory Visit: Payer: Self-pay | Admitting: Student

## 2016-10-18 DIAGNOSIS — I10 Essential (primary) hypertension: Secondary | ICD-10-CM

## 2016-10-25 ENCOUNTER — Ambulatory Visit (INDEPENDENT_AMBULATORY_CARE_PROVIDER_SITE_OTHER): Payer: Medicare HMO | Admitting: Family Medicine

## 2016-10-25 ENCOUNTER — Encounter: Payer: Self-pay | Admitting: Family Medicine

## 2016-10-25 VITALS — BP 118/90 | HR 76 | Temp 98.3°F | Ht 63.0 in | Wt 202.8 lb

## 2016-10-25 DIAGNOSIS — M5442 Lumbago with sciatica, left side: Secondary | ICD-10-CM

## 2016-10-25 DIAGNOSIS — M5441 Lumbago with sciatica, right side: Secondary | ICD-10-CM | POA: Diagnosis not present

## 2016-10-25 DIAGNOSIS — G8929 Other chronic pain: Secondary | ICD-10-CM | POA: Diagnosis not present

## 2016-10-25 DIAGNOSIS — I10 Essential (primary) hypertension: Secondary | ICD-10-CM

## 2016-10-25 DIAGNOSIS — R7303 Prediabetes: Secondary | ICD-10-CM | POA: Diagnosis not present

## 2016-10-25 DIAGNOSIS — M5440 Lumbago with sciatica, unspecified side: Secondary | ICD-10-CM

## 2016-10-25 LAB — POCT GLYCOSYLATED HEMOGLOBIN (HGB A1C): HEMOGLOBIN A1C: 6

## 2016-10-25 MED ORDER — DULOXETINE HCL 30 MG PO CPEP: 30.0000 mg | ORAL_CAPSULE | Freq: Every day | ORAL | 1 refills | Status: DC

## 2016-10-25 MED ORDER — CARVEDILOL 25 MG PO TABS: 25.0000 mg | ORAL_TABLET | Freq: Two times a day (BID) | ORAL | 2 refills | Status: DC

## 2016-10-25 NOTE — Assessment & Plan Note (Signed)
Chronic, without acute changes. No red flags on exam.   - continue gabapentin  - encouraged patient to f/u with PT - patient reports 7/10 pain but appears to be doing well off narcotics. Will hold off on pain management. - add cymbalta 30mg  daily, may need to increase to 60mg  daily. - patient to f/u with new PCP.

## 2016-10-25 NOTE — Assessment & Plan Note (Signed)
Stable despite discontinuation of HCTZ.  - continue coreg and losartan  - hold HCTZ - follow up in 2-3 months with new PCP

## 2016-10-25 NOTE — Progress Notes (Signed)
Subjective:   Kristen [name] is a 56 y.o. female with a history of HTN, HLD, statin myopathy, morbid obesity, chronic lower back pain here for pain and hypertension follow-up  HTN: - Was seen on 05/30/16, at that time BP was not controlled however she stop taking HCTZ due to hand cramping. She was re-started on a lower dose (25mg  daily) at that time - patient states she has not been taking this medication as it still caused hand cramping.  - Medications: Losartan 50 mg twice a day, Coreg 25 mg twice a day - Checking BP at home: no - Denies any SOB, chest pain,  vision changes, LE edema, medication SEs, or symptoms of hypotension - Diet: avoids excess sodium, no canned foods or fast food - Exercise: walking daily 20-30 min - advised in 2016 to see Cardiology for stress test but CP was thought to be MSK - no CP currently  Chronic LBP related to lumbar radiculopathy - Was seen by neurology on 05/29/16- as patient declined surgical intervention, there was no role for further neuro imaging - Physical therapy and pain management clinic referrals placed in 05/2016: she has not scheduled either (they've called but she just hasn't contacted them) - pain 7/10, feels like someone is stabbing. - pain shots down LEs bilaterally, and numbness and tingling - stable from last visit.  - taking gabapentin qHS- makes her drowsy - no longer taking Flexeril - was prescribed percocet  5-325mg  q8h prn 5-6 months ago and felt it helped, hasn't taken any since then  - "living with back pain" but would like to try something to help with pain if possible.  - PT helped in the past - denies weakness, numbness, incontinence, fevers - usually walks with cane - would also like a handicap placard form completed today  Review of Systems:  Per HPI.   Social History: Former smoker  Objective:  BP 118/90   Pulse 76   Temp 98.3 F (36.8 C) (Oral)   Ht 5\' 3"  (1.6 m)   Wt 202 lb 12.8 oz (92 kg)   SpO2 98%    BMI 35.92 kg/m   Gen:  56 y.o. female in NAD  HEENT: NCAT, MMM, EOMI, PERRL, anicteric sclerae CV: RRR, no MRG, +Chest tenderness to palpation Resp: Non-labored, CTAB, no wheezes noted Ext: WWP, no edema MSK: normal to inspection. Full ROM.  No midline tenderness to palpation, strength and sensation intact in the lower extremities. Symmetric DTRs.  Neuro: Alert and oriented, speech normal     Results for orders placed or performed in visit on 10/25/16  HgB A1c  Result Value Ref Range   Hemoglobin A1C 6.0      Assessment & Plan:     Kristen [name] is a 56 y.o. female here for   HYPERTENSION, BENIGN SYSTEMIC Stable despite discontinuation of HCTZ.  - continue coreg and losartan  - hold HCTZ - follow up in 2-3 months with new PCP  Back pain of lumbar region with sciatica Chronic, without acute changes. No red flags on exam.   - continue gabapentin  - encouraged patient to f/u with PT - patient reports 7/10 pain but appears to be doing well off narcotics. Will hold off on pain management. - add cymbalta 30mg  daily, may need to increase to 60mg  daily. - patient to f/u with new PCP.   Prediabetes A1c stable.    Archie Patten, MD PGY-3,  [facility] 10/25/2016  11:47 AM

## 2016-10-25 NOTE — Patient Instructions (Signed)
START taking Cymbalta once daily to see if this helps with your back pain. It can sometimes take several weeks to kick in-- some need an increased dose (60mg  daily) to notice a difference. Please schedule an appointment with PT  I have taken hydrochlorothiazide off your medication list. You are due for your next pap smear 07/2017.

## 2016-10-25 NOTE — Assessment & Plan Note (Signed)
A1c stable

## 2016-11-30 ENCOUNTER — Emergency Department (HOSPITAL_COMMUNITY)
Admission: EM | Admit: 2016-11-30 | Discharge: 2016-11-30 | Disposition: A | Discharge status: Home / Self Care | Payer: Medicare HMO | Attending: Emergency Medicine | Admitting: Emergency Medicine

## 2016-11-30 ENCOUNTER — Encounter (HOSPITAL_COMMUNITY): Payer: Self-pay | Admitting: Emergency Medicine

## 2016-11-30 ENCOUNTER — Emergency Department (HOSPITAL_COMMUNITY): Payer: Medicare HMO

## 2016-11-30 DIAGNOSIS — F17210 Nicotine dependence, cigarettes, uncomplicated: Secondary | ICD-10-CM | POA: Insufficient documentation

## 2016-11-30 DIAGNOSIS — E785 Hyperlipidemia, unspecified: Secondary | ICD-10-CM | POA: Diagnosis not present

## 2016-11-30 DIAGNOSIS — R7303 Prediabetes: Secondary | ICD-10-CM | POA: Diagnosis not present

## 2016-11-30 DIAGNOSIS — I10 Essential (primary) hypertension: Secondary | ICD-10-CM | POA: Diagnosis not present

## 2016-11-30 DIAGNOSIS — R079 Chest pain, unspecified: Secondary | ICD-10-CM | POA: Insufficient documentation

## 2016-11-30 DIAGNOSIS — Z79899 Other long term (current) drug therapy: Secondary | ICD-10-CM | POA: Insufficient documentation

## 2016-11-30 DIAGNOSIS — Z7982 Long term (current) use of aspirin: Secondary | ICD-10-CM | POA: Insufficient documentation

## 2016-11-30 LAB — BASIC METABOLIC PANEL
ANION GAP: 7 (ref 5–15)
BUN: 15 mg/dL (ref 6–20)
CALCIUM: 8.9 mg/dL (ref 8.9–10.3)
CO2: 27 mmol/L (ref 22–32)
Chloride: 106 mmol/L (ref 101–111)
Creatinine, Ser: 1.05 mg/dL — ABNORMAL HIGH (ref 0.44–1.00)
GFR, EST NON AFRICAN AMERICAN: 59 mL/min — AB (ref >60)
Glucose, Bld: 89 mg/dL (ref 65–99)
POTASSIUM: 4.1 mmol/L (ref 3.5–5.1)
SODIUM: 140 mmol/L (ref 135–145)

## 2016-11-30 LAB — CBC
HCT: 43.3 % (ref 36.0–46.0)
HEMOGLOBIN: 14 g/dL (ref 12.0–15.0)
MCH: 27.4 pg (ref 26.0–34.0)
MCHC: 32.3 g/dL (ref 30.0–36.0)
MCV: 84.7 fL (ref 78.0–100.0)
Platelets: 251 10*3/uL (ref 150–400)
RBC: 5.11 MIL/uL (ref 3.87–5.11)
RDW: 15.3 % (ref 11.5–15.5)
WBC: 8.2 10*3/uL (ref 4.0–10.5)

## 2016-11-30 LAB — I-STAT TROPONIN, ED: Troponin i, poc: 0 ng/mL (ref 0.00–0.08)

## 2016-11-30 MED ORDER — FAMOTIDINE 20 MG PO TABS: 20.0000 mg | ORAL_TABLET | Freq: Two times a day (BID) | ORAL | 0 refills | Status: DC

## 2016-11-30 MED ORDER — FAMOTIDINE 20 MG PO TABS
20.0000 mg | ORAL_TABLET | Freq: Once | ORAL | Status: AC
  Administered 2016-11-30: 20 mg via ORAL
  Filled 2016-11-30: qty 1

## 2016-11-30 NOTE — ED Triage Notes (Signed)
Pt states "ive had chest pain for a couple days center chest pain stabbing, and abdominal stabbing pain for a couple days." Pt in NAD. VSS.

## 2016-11-30 NOTE — ED Provider Notes (Signed)
[facility] DEPT Provider Note   CSN: [id number] Arrival date & time: 11/30/16  0906     History   Chief Complaint Chief Complaint  Patient presents with  . Chest Pain  . Abdominal Pain    HPI Kristen [name] is a 56 y.o. female.  HPI  56 y.o. female with a hx of HTN, HLD, presents to the Emergency Department today due to chest pain x several days. Notes stabbing sensation from epigastrium into chest. No radiation. No N/V. No diaphoresis. States pain is intermittent without exacerbating factors. No worsening with exertion. No worsening with PO intake. Denies CP currently. No SOB/ABD pain. No fevers. No cough or URI symptoms. No meds PTA. No hx ACS. No FH. Risk factors are HTN. No hx DVT/PE. No other symptoms noted.   Past Medical History:  Diagnosis Date  . Allergy   . Back pain    Seen by Neurosurery  . Hyperlipidemia   . Hypertension   . Leg pain    Bil  . Obesity   . Peripheral neuropathy   . Post-menopausal   . Sleep apnea    no cpap    Patient Active Problem List   Diagnosis Date Noted  . 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 Dizziness 02/21/2015  . 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 Myalgia 12/18/2014  . Routine adult health maintenance 07/20/2014  . Hamstring injury 12/22/2013  . Left shoulder pain 09/29/2013  . Bilateral edema of lower extremity 09/29/2013  . Chest pain, atypical 08/28/2012  . New onset of headaches 08/05/2012  . Neck pain 12/26/2011  . Obstructive sleep apnea 12/26/2011  . Fatigue 09/13/2011  . Prediabetes 07/18/2011  . Back pain of lumbar region with sciatica 04/26/2010  . Morbid obesity ([place]) 04/16/2010  . 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 GAIT DISTURBANCE 05/03/2009  . Allergic rhinitis 09/15/2008  . MENOPAUSAL SYNDROME 09/05/2008  . Hyperlipemia 06/12/2007  . HYPERTENSION, BENIGN SYSTEMIC 07/17/2006    Past Surgical History:  Procedure Laterality Date  . carpel tunnel release Bilateral   . COLONOSCOPY     . LAMINECTOMY  01/20/2008   L4-L5 by Dr Latanya Maudlin (Ortho), [facility]  . POLYPECTOMY    . TUBAL LIGATION      OB History    No data available       Home Medications    Prior to Admission medications   Medication Sig Start Date End Date Taking? Authorizing Provider  albuterol (PROVENTIL HFA;VENTOLIN HFA) 108 (90 Base) MCG/ACT inhaler Inhale 1-2 puffs into the lungs every 6 (six) hours as needed for wheezing or shortness of breath. 04/03/16   Archie Patten, MD  aspirin 81 MG chewable tablet Chew 1 tablet (81 mg total) by mouth daily. 09/07/15   Leo Grosser, MD  carvedilol (COREG) 25 MG tablet Take 1 tablet (25 mg total) by mouth 2 (two) times daily with a meal. 10/25/16   Archie Patten, MD  cetirizine (ZYRTEC) 10 MG tablet Take 1 tablet (10 mg total) by mouth daily. Patient taking differently: Take 10 mg by mouth daily as needed for allergies.  02/21/15   Archie Patten, MD  diphenoxylate-atropine (LOMOTIL) 2.5-0.025 MG tablet Take 2 tablets by mouth 4 (four) times daily as needed for diarrhea or loose stools. 08/19/16   Lacretia Leigh, MD  DULoxetine (CYMBALTA) 30 MG capsule Take 1 capsule (30 mg total) by mouth daily. 10/25/16   Archie Patten, MD  fluticasone (FLONASE) 50 MCG/ACT nasal spray Place 2 sprays into both nostrils  daily. Patient taking differently: Place 2 sprays into both nostrils daily as needed for allergies.  02/21/15   Archie Patten, MD  gabapentin (NEURONTIN) 300 MG capsule Take 600mg  every afternoon and 900mg  qhs 05/30/16   Bacigalupo, Dionne Bucy, MD  losartan (COZAAR) 50 MG tablet take 1 tablet by mouth twice a day 10/18/16   Archie Patten, MD  ondansetron (ZOFRAN ODT) 8 MG disintegrating tablet Take 1 tablet (8 mg total) by mouth every 8 (eight) hours as needed for nausea or vomiting. 08/19/16   Lacretia Leigh, MD  oxybutynin (DITROPAN) 5 MG tablet Take 0.5 tablets (2.5 mg total) by mouth daily. 02/20/16    Bing, DO    Family  History Family History  Problem Relation Age of Onset  . Lupus Mother   . Heart disease Mother   . Anemia Father   . Hypertension Father   . Arthritis Brother   . Asthma Son   . Lupus Daughter   . Colon cancer Neg Hx   . Colon polyps Neg Hx   . Esophageal cancer Neg Hx   . Rectal cancer Neg Hx   . Stomach cancer Neg Hx   . Breast cancer Neg Hx     Social History Social History  Substance Use Topics  . Smoking status: Former Smoker    Packs/day: 0.25    Years: 0.50    Types: Cigarettes    Quit date: 12/19/1990  . Smokeless tobacco: Never Used     Comment: Quit 25 years ago.  . Alcohol use No     Comment: 05/29/16 none     Allergies   Amoxicillin and Penicillins   Review of Systems Review of Systems ROS reviewed and all are negative for acute change except as noted in the HPI.  Physical Exam Updated Vital Signs BP (!) 129/95   Pulse 99   Temp 98.2 F (36.8 C) (Oral)   Resp 16   Ht 5' 3.5" (1.613 m)   Wt 90.7 kg (200 lb)   SpO2 95%   BMI 34.87 kg/m   Physical Exam  Constitutional: She is oriented to person, place, and time. Vital signs are normal. She appears well-developed and well-nourished.  NAD. Resting Comfortably   HENT:  Head: Normocephalic and atraumatic.  Right Ear: Hearing normal.  Left Ear: Hearing normal.  Eyes: Pupils are equal, round, and reactive to light. Conjunctivae and EOM are normal.  Neck: Normal range of motion. Neck supple.  Cardiovascular: Normal rate, regular rhythm, normal heart sounds and intact distal pulses.   Pulmonary/Chest: Effort normal and breath sounds normal.  Abdominal: Soft. Normal appearance. There is no tenderness. There is no rigidity, no rebound, no guarding, no CVA tenderness, no tenderness at McBurney's point and negative Murphy's sign.  Neurological: She is alert and oriented to person, place, and time.  Skin: Skin is warm and dry.  Psychiatric: She has a normal mood and affect. Her speech is normal and  behavior is normal. Thought content normal.  Nursing note and vitals reviewed.    ED Treatments / Results  Labs (all labs ordered are listed, but only abnormal results are displayed) Labs Reviewed  BASIC METABOLIC PANEL - Abnormal; Notable for the following:       Result Value   Creatinine, Ser 1.05 (*)    GFR calc non Af Amer 59 (*)    All other components within normal limits  CBC  I-STAT TROPOININ, ED    EKG  EKG Interpretation None  Radiology Dg Chest 2 View  Result Date: 11/30/2016 CLINICAL DATA:  Stabbing mid chest pain and upper mid abdominal pain for the past 2 days. EXAM: CHEST  2 VIEW COMPARISON:  09/07/2015. FINDINGS: Normal sized heart. Clear lungs. Central peribronchial thickening. Thoracic spine degenerative changes. Mild scoliosis. Cystic changes in both humeral heads. IMPRESSION: Moderate central bronchitic changes with mild progression. Electronically Signed   By: Claudie Revering M.D.   On: 11/30/2016 09:35    Procedures Procedures (including critical care time)  Medications Ordered in ED Medications  famotidine (PEPCID) tablet 20 mg (not administered)     Initial Impression / Assessment and Plan / ED Course  I have reviewed the triage vital signs and the nursing notes.  Pertinent labs & imaging results that were available during my care of the patient were reviewed by me and considered in my medical decision making (see chart for details).  Final Clinical Impressions(s) / ED Diagnoses  {I have reviewed and evaluated the relevant laboratory values. {I have reviewed and evaluated the relevant imaging studies. {I have interpreted the relevant EKG. {I have reviewed the relevant previous healthcare records.  {I obtained HPI from historian.   ED Course:  Assessment: Pt is a 56 y.o. female presents with CP x several days. Intermittent. No N/V. No numbness/tingling. No diaphoresis. No active CP currently.. Risk Factors HTN. Given Pepcid in ED. Patient is  to be discharged with recommendation to follow up with PCP in regards to today's hospital visit. Chest pain is not likely of cardiac or pulmonary etiology d/t presentation, perc negative, VSS, no tracheal deviation, no JVD or new murmur, RRR, breath sounds equal bilaterally, EKG without acute abnormalities, negative troponin, and negative CXR. Symptoms consistent with likely GERD. Pt consumes hot sauce regularly and used to be on PPIs, but DCed several months ago due to side effects. Doubt ACS. Pt has been advised start H2 blocker and return to the ED is CP becomes exertional, associated with diaphoresis or nausea, radiates to left jaw/arm, worsens or becomes concerning in any way. Pt appears reliable for follow up and is agreeable to discharge. Patient is in no acute distress. Vital Signs are stable. Patient is able to ambulate. Patient able to tolerate PO.   Disposition/Plan:  DC Home Additional Verbal discharge instructions given and discussed with patient.  Pt Instructed to f/u with PCP in the next week for evaluation and treatment of symptoms. Return precautions given Pt acknowledges and agrees with plan  Supervising Physician Fredia Sorrow, MD  Final diagnoses:  Chest pain, unspecified type    New Prescriptions New Prescriptions   No medications on file     Shary Decamp, Hershal Coria 11/30/16 1116    Fredia Sorrow, MD 12/01/16 1213

## 2016-11-30 NOTE — Discharge Instructions (Signed)
Please read and follow all provided instructions.  Your diagnoses today include:  1. Chest pain, unspecified type     Tests performed today include: An EKG of your heart A chest x-ray Cardiac enzymes - a blood test for heart muscle damage Blood counts and electrolytes Vital signs. See below for your results today.   Medications prescribed:   Take any prescribed medications only as directed.  Follow-up instructions: Please follow-up with your primary care provider as soon as you can for further evaluation of your symptoms.   Return instructions:  SEEK IMMEDIATE MEDICAL ATTENTION IF: You have severe chest pain, especially if the pain is crushing or pressure-like and spreads to the arms, back, neck, or jaw, or if you have sweating, nausea (feeling sick to your stomach), or shortness of breath. THIS IS AN EMERGENCY. Don't wait to see if the pain will go away. Get medical help at once. Call 911 or 0 (operator). DO NOT drive yourself to the hospital.  Your chest pain gets worse and does not go away with rest.  You have an attack of chest pain lasting longer than usual, despite rest and treatment with the medications your caregiver has prescribed.  You wake from sleep with chest pain or shortness of breath. You feel dizzy or faint. You have chest pain not typical of your usual pain for which you originally saw your caregiver.  You have any other emergent concerns regarding your health.  Additional Information: Chest pain comes from many different causes. Your caregiver has diagnosed you as having chest pain that is not specific for one problem, but does not require admission.  You are at low risk for an acute heart condition or other serious illness.   Your vital signs today were: BP (!) 129/95    Pulse 99    Temp 98.2 F (36.8 C) (Oral)    Resp 16    Ht 5' 3.5" (1.613 m)    Wt 90.7 kg (200 lb)    SpO2 95%    BMI 34.87 kg/m  If your blood pressure (BP) was elevated above 135/85 this  visit, please have this repeated by your doctor within one month. --------------

## 2016-12-03 ENCOUNTER — Encounter: Payer: Self-pay | Admitting: Internal Medicine

## 2016-12-03 ENCOUNTER — Ambulatory Visit (INDEPENDENT_AMBULATORY_CARE_PROVIDER_SITE_OTHER): Payer: Medicare HMO | Admitting: Internal Medicine

## 2016-12-03 VITALS — BP 134/90 | HR 82 | Temp 98.1°F | Wt 204.0 lb

## 2016-12-03 DIAGNOSIS — R519 Headache, unspecified: Secondary | ICD-10-CM

## 2016-12-03 DIAGNOSIS — G4733 Obstructive sleep apnea (adult) (pediatric): Secondary | ICD-10-CM | POA: Diagnosis not present

## 2016-12-03 DIAGNOSIS — R51 Headache: Secondary | ICD-10-CM | POA: Diagnosis not present

## 2016-12-03 LAB — POCT SEDIMENTATION RATE: POCT SED RATE: 8 mm/hr (ref 0–22)

## 2016-12-03 MED ORDER — TRAZODONE HCL 50 MG PO TABS: 25.0000 mg | ORAL_TABLET | Freq: Every evening | ORAL | 3 refills | Status: DC | PRN

## 2016-12-03 MED ORDER — BUTALBITAL-APAP-CAFFEINE 50-325-40 MG PO TABS: 1.0000 | ORAL_TABLET | Freq: Four times a day (QID) | ORAL | 0 refills | Status: DC | PRN

## 2016-12-03 NOTE — Progress Notes (Signed)
   [facility] Kerrin Mo, MD Phone: [phone]  Reason For Visit: SDA for Headache   #SDA for Headache   Onset: started about 2 months, starts in the back goes up to the front - indicates having neck/back pain, pain at times does radiate into shoulders  Frequency: states she has the headaches every day  No previous hx of headaches per patient - though on review of patient medical hx has had headaches in the past  Intensity: Severe pain associated  Duration: last about 2 hours or longer  Site/Radition of Pain: starts in the back of neck and migrate to temples and the front of the head Temporal Pain: positive for temporal pain bilaterally  Presence of Aura: none  Precipitating factors: None  Medications:Tried Aleve which does not help  Vision Changes: has had blurry vision for several years - not new, however states it has been worsening  Sleep issues: has been having issues with insomnia, does not take any caffenine products, has been seen for sleep study - noted to have sleep apnea, however does not use CPAP. Not sure why she has not received. Denies any worsening depression or anxiety.  Sleep - 10:30 pm - 1 AM, gets up immediately there after.   ROS:  Fever/Weight loss: None  Neurologic Impairment: No focal impairment noted  Sudden onset (age > 37) : None  Headache awakening from sleep: No  Nausea/Vomitting: no nausea or vomiting associated with headaches  Worsening Progression of Headache: feels like they maybe worsening   Trauma: None    Past Medical History Reviewed problem list.  Medications- reviewed and updated No additions to family history Social history- patient is a non-smoker  Objective: BP 134/90   Pulse 82   Temp 98.1 F (36.7 C) (Oral)   Wt 204 lb (92.5 kg)   BMI 35.57 kg/m  Gen: NAD, alert, cooperative with exam HEENT: Normal, temporal tenderness bilaterally     Neck: No masses palpated. No lymphadenopathy    Throat: moist  mucus membranes, no erythema Cardio: regular rate and rhythm, S1S2 heard, no murmurs appreciated Pulm: clear to auscultation bilaterally, no wheezes, rhonchi or rales Extremities: warm, well perfused, No edema,  Neurologic exam : Cn 2-7 intact Strength equal & normal in upper & lower extremities  Balance normal  finger to nose    Assessment/Plan: See problem based a/p  Headache Given patient age and some temporal tenderness with evaluate for temporal arteritis. Only red flag for headaches is age - though patient has had worsening vision changes of the last several years and sleep issue which could very be contributing - Sedimentation Rate - Ambulatory referral to Ophthalmology - traZODone (DESYREL) 50 MG tablet; Take 0.5-1 tablets (25-50 mg total) by mouth at bedtime as needed for sleep.  Dispense: 30 tablet;, - also with a hx of sleep apnea not using a CPAP machine , never obtained one - will provide a DME  - butalbital-acetaminophen-caffeine (FIORICET, ESGIC) 50-325-40 MG tablet; Take 1-2 tablets by mouth every 6 (six) hours as needed for headache.  Dispense: 20 tablet; Refill: 0

## 2016-12-03 NOTE — Patient Instructions (Signed)
I want you to try trazodone. I have prescribe fiorcet for your headaches as needed. We will do an initial blood test. Please follow up in two weeks or sooner if you have worsening symptoms. I also want you to get your eyes checked

## 2016-12-03 NOTE — Assessment & Plan Note (Addendum)
Given patient age and some temporal tenderness with evaluate for temporal arteritis. Only red flag for headaches is age - though patient has had worsening vision changes of the last several years and sleep issue which could very be contributing - Sedimentation Rate - Ambulatory referral to Ophthalmology - traZODone (DESYREL) 50 MG tablet; Take 0.5-1 tablets (25-50 mg total) by mouth at bedtime as needed for sleep.  Dispense: 30 tablet;, - also with a hx of sleep apnea not using a CPAP machine , never obtained one - will provide a DME  - butalbital-acetaminophen-caffeine (FIORICET, ESGIC) 50-325-40 MG tablet; Take 1-2 tablets by mouth every 6 (six) hours as needed for headache.  Dispense: 20 tablet; Refill: 0

## 2016-12-03 NOTE — Addendum Note (Signed)
Addended by: [place] Pink on: 12/03/2016 03:11 PM   Modules accepted: Orders

## 2016-12-04 ENCOUNTER — Encounter: Payer: Self-pay | Admitting: Internal Medicine

## 2016-12-05 ENCOUNTER — Telehealth: Payer: Self-pay | Admitting: Internal Medicine

## 2016-12-05 NOTE — Telephone Encounter (Signed)
Patient informed, expressed understanding. 

## 2016-12-05 NOTE — Telephone Encounter (Signed)
Please call patient and let her know that her blood work is normal. Please also let her know that I have placed an order for her to get a CPAP machine which hopeful might help significantly with her sleep   Kristen [name]

## 2016-12-16 DIAGNOSIS — H35033 Hypertensive retinopathy, bilateral: Secondary | ICD-10-CM | POA: Diagnosis not present

## 2016-12-16 DIAGNOSIS — H2513 Age-related nuclear cataract, bilateral: Secondary | ICD-10-CM | POA: Diagnosis not present

## 2016-12-16 DIAGNOSIS — R51 Headache: Secondary | ICD-10-CM | POA: Diagnosis not present

## 2016-12-16 DIAGNOSIS — H524 Presbyopia: Secondary | ICD-10-CM | POA: Diagnosis not present

## 2016-12-16 DIAGNOSIS — H04123 Dry eye syndrome of bilateral lacrimal glands: Secondary | ICD-10-CM | POA: Diagnosis not present

## 2016-12-19 ENCOUNTER — Ambulatory Visit: Payer: Medicare HMO | Admitting: Internal Medicine

## 2016-12-27 ENCOUNTER — Ambulatory Visit: Payer: Medicare HMO | Admitting: Internal Medicine

## 2017-01-06 ENCOUNTER — Ambulatory Visit (INDEPENDENT_AMBULATORY_CARE_PROVIDER_SITE_OTHER): Payer: Medicare HMO | Admitting: Internal Medicine

## 2017-01-06 ENCOUNTER — Encounter: Payer: Self-pay | Admitting: Internal Medicine

## 2017-01-06 VITALS — BP 124/82 | HR 108 | Temp 99.0°F | Wt 200.0 lb

## 2017-01-06 DIAGNOSIS — M5440 Lumbago with sciatica, unspecified side: Secondary | ICD-10-CM

## 2017-01-06 DIAGNOSIS — I10 Essential (primary) hypertension: Secondary | ICD-10-CM

## 2017-01-06 DIAGNOSIS — M549 Dorsalgia, unspecified: Secondary | ICD-10-CM | POA: Insufficient documentation

## 2017-01-06 DIAGNOSIS — G8929 Other chronic pain: Secondary | ICD-10-CM

## 2017-01-06 MED ORDER — CARVEDILOL 25 MG PO TABS: 25.0000 mg | ORAL_TABLET | Freq: Two times a day (BID) | ORAL | 2 refills | Status: DC

## 2017-01-06 MED ORDER — TRAMADOL HCL 50 MG PO TABS: 50.0000 mg | ORAL_TABLET | Freq: Three times a day (TID) | ORAL | 0 refills | Status: DC | PRN

## 2017-01-06 MED ORDER — MELOXICAM 7.5 MG PO TABS: 7.5000 mg | ORAL_TABLET | Freq: Every day | ORAL | 0 refills | Status: DC

## 2017-01-06 MED ORDER — LOSARTAN POTASSIUM 50 MG PO TABS: 50.0000 mg | ORAL_TABLET | Freq: Two times a day (BID) | ORAL | 1 refills | Status: DC

## 2017-01-06 NOTE — Progress Notes (Deleted)
   [facility] Kerrin Mo, MD Phone: [phone]  Reason For Visit:   # *** -   Past Medical History Reviewed problem list.  Medications- reviewed and updated No additions to family history Social history- patient is a *** smoker  Objective: BP 124/82   Pulse (!) 108   Temp 99 F (37.2 C) (Oral)   Wt 200 lb (90.7 kg)   SpO2 94%   BMI 34.87 kg/m  Gen: NAD, alert, cooperative with exam HEENT: Normal    Neck: No masses palpated. No lymphadenopathy    Ears: Tympanic membranes intact, normal light reflex, no erythema, no bulging    Eyes: PERRLA, EOMI    Nose: nasal turbinates moist    Throat: moist mucus membranes, no erythema Cardio: regular rate and rhythm, S1S2 heard, no murmurs appreciated Pulm: clear to auscultation bilaterally, no wheezes, rhonchi or rales GI: soft, non-tender, non-distended, bowel sounds present, no hepatomegaly, no splenomegaly GU: external vaginal tissue ***, cervix ***, *** punctate lesions on cervix appreciated, *** discharge from cervical os, *** bleeding, *** cervical motion tenderness, *** abdominal/ adnexal masses Extremities: warm, well perfused, No edema, cyanosis or clubbing;  MSK: Normal gait and station Skin: dry, intact, no rashes or lesions Neuro: Strength and sensation grossly intact   Assessment/Plan: See problem based a/p  No problem-specific Assessment & Plan notes found for this encounter.

## 2017-01-06 NOTE — Progress Notes (Signed)
   [facility] Kerrin Mo, MD Phone: [phone]  Reason For Visit: SDA for Chronic Back Pain and HTN   # BACK PAIN  Back pain began 5 days ago. Hx of chronic back pain about 4-5 years. Patient had a fall several years  back which she thinks is the initial cause. Patient states pain is worse with activity and indicates that it wakes her up at night  Pain is described as lower back pain, it radiates down both her legs . Patient has tried percocet last night. Has tried water aerobics, TENS units, currently takes gabapentin - does not feel like it is working  Pain radiates  Both legs  History of trauma or injury: No recent  Prior history of similar pain: has seen in the past, received pain pills  History of cancer: No Weak immune system:  Non History of IV drug use: No History of steroid use: No  Symptoms Incontinence of bowel or bladder:  No Numbness of leg: in both legs  Fever: no Rest or Night pain: no  Weight Loss:  No   CHRONIC HTN: Current Meds - Coreg and losartan Reports good compliance,took meds today. Tolerating well, w/o complaints. Lifestyle - has been staying away from sweet tea, understands weight loss is important for both back and  Denies CP, dyspnea, HA, edema, dizziness / lightheadedness  Past Medical History Reviewed problem list.  Medications- reviewed and updated No additions to family history Social history- patient is a non smoker  Objective: BP 124/82   Pulse (!) 108   Temp 99 F (37.2 C) (Oral)   Wt 200 lb (90.7 kg)   SpO2 94%   BMI 34.87 kg/m  Gen: NAD, alert, cooperative with exam Cardio: regular rate and rhythm, pulse 90, not tachycardic, S1S2 heard, no murmurs appreciated Back: no abnormalities noted, pirformis tenderness, radiates down the back of both legs, positive straight leg test, good strength in bilateral lower extremities   Assessment/Plan: See problem based a/p  Back pain Chronic low back pain with  sciatic, no red flags. Surgery for ruptured disc in 10 years ago. MRI lumbar in 2012 with degenerative changes/post-operative changes  - meloxicam (MOBIC) 7.5 MG tablet; Take 1 tablet (7.5 mg total) by mouth daily.  Dispense: 14 tablet; Refill: 0 - traMADol (ULTRAM) 50 MG tablet; Take 1 tablet (50 mg total) by mouth every 8 (eight) hours as needed.  Dispense: 7 tablet; Refill: 0 for break through pain. Advised patient not to take trazodone and tramadol together  - Ambulatory referral to Sports Medicine for long term help with core strengthen - patient has been dealing with this issue for many years     HYPERTENSION, BENIGN SYSTEMIC Well controlled  - losartan (COZAAR) 50 MG tablet; Take 1 tablet (50 mg total) by mouth 2 (two) times daily.  Dispense: 60 tablet; Refill: 1 - carvedilol (COREG) 25 MG tablet; Take 1 tablet (25 mg total) by mouth 2 (two) times daily with a meal.  Dispense: 60 tablet; Refill: 2

## 2017-01-06 NOTE — Patient Instructions (Signed)
You can take Mobic daily for the next 2 weeks to help with your back pain. I am giving several pills of tramadol which you can take once daily as needed for intermittent pain. While taking the tramadol will not be able to take trazodone as well

## 2017-01-07 NOTE — Assessment & Plan Note (Signed)
Well controlled  - losartan (COZAAR) 50 MG tablet; Take 1 tablet (50 mg total) by mouth 2 (two) times daily.  Dispense: 60 tablet; Refill: 1 - carvedilol (COREG) 25 MG tablet; Take 1 tablet (25 mg total) by mouth 2 (two) times daily with a meal.  Dispense: 60 tablet; Refill: 2

## 2017-01-07 NOTE — Assessment & Plan Note (Addendum)
Chronic low back pain with sciatic, no red flags. Surgery for ruptured disc in 10 years ago. MRI lumbar in 2012 with degenerative changes/post-operative changes  - meloxicam (MOBIC) 7.5 MG tablet; Take 1 tablet (7.5 mg total) by mouth daily.  Dispense: 14 tablet; Refill: 0 - traMADol (ULTRAM) 50 MG tablet; Take 1 tablet (50 mg total) by mouth every 8 (eight) hours as needed.  Dispense: 7 tablet; Refill: 0 for break through pain. Advised patient not to take trazodone and tramadol together  - Ambulatory referral to Sports Medicine for long term help with core strengthen - patient has been dealing with this issue for many years

## 2017-01-28 ENCOUNTER — Ambulatory Visit: Payer: Medicare HMO | Admitting: Internal Medicine

## 2017-03-08 ENCOUNTER — Encounter (HOSPITAL_COMMUNITY): Payer: Self-pay | Admitting: Emergency Medicine

## 2017-03-08 ENCOUNTER — Ambulatory Visit (HOSPITAL_COMMUNITY)
Admission: EM | Admit: 2017-03-08 | Discharge: 2017-03-08 | Disposition: A | Discharge status: Home / Self Care | Payer: Medicare HMO | Attending: Family Medicine | Admitting: Family Medicine

## 2017-03-08 DIAGNOSIS — T148XXA Other injury of unspecified body region, initial encounter: Secondary | ICD-10-CM

## 2017-03-08 DIAGNOSIS — S161XXA Strain of muscle, fascia and tendon at neck level, initial encounter: Secondary | ICD-10-CM | POA: Diagnosis not present

## 2017-03-08 MED ORDER — KETOROLAC TROMETHAMINE 60 MG/2ML IM SOLN
INTRAMUSCULAR | Status: AC
  Filled 2017-03-08: qty 2

## 2017-03-08 MED ORDER — KETOROLAC TROMETHAMINE 60 MG/2ML IM SOLN
45.0000 mg | Freq: Once | INTRAMUSCULAR | Status: AC
  Administered 2017-03-08: 45 mg via INTRAMUSCULAR

## 2017-03-08 MED ORDER — CYCLOBENZAPRINE HCL 5 MG PO TABS: 5.0000 mg | ORAL_TABLET | Freq: Three times a day (TID) | ORAL | 0 refills | Status: DC | PRN

## 2017-03-08 MED ORDER — NAPROXEN 375 MG PO TABS: 375.0000 mg | ORAL_TABLET | Freq: Two times a day (BID) | ORAL | 0 refills | Status: DC
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
## 2017-03-08 NOTE — Discharge Instructions (Signed)
Apply heat to the neck muscles. Do this 4 to 5 times a day.performance gentle stretches as demonstrated. Take the anti-inflammatory medicine as directed. Do not take any other similar medicines at the same time. May also take the muscle relaxant. This may cause some drowsiness.
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
## 2017-03-10 ENCOUNTER — Ambulatory Visit (INDEPENDENT_AMBULATORY_CARE_PROVIDER_SITE_OTHER): Payer: Medicare HMO | Admitting: Internal Medicine

## 2017-03-10 VITALS — BP 125/80 | HR 83 | Temp 98.2°F | Ht 63.0 in | Wt 196.0 lb

## 2017-03-10 DIAGNOSIS — G8929 Other chronic pain: Secondary | ICD-10-CM

## 2017-03-10 DIAGNOSIS — M5440 Lumbago with sciatica, unspecified side: Secondary | ICD-10-CM | POA: Diagnosis not present

## 2017-03-10 DIAGNOSIS — Z23 Encounter for immunization: Secondary | ICD-10-CM | POA: Diagnosis not present

## 2017-03-10 DIAGNOSIS — R278 Other lack of coordination: Secondary | ICD-10-CM

## 2017-03-10 MED ORDER — TRAMADOL HCL 50 MG PO TABS: 50.0000 mg | ORAL_TABLET | Freq: Three times a day (TID) | ORAL | 0 refills | Status: DC | PRN

## 2017-03-10 NOTE — Patient Instructions (Addendum)
I am sorry you have been having such significant pain.  You can use tramadol as needed. Will check on the sports medicine referral. I am going to check on some of your labs to check on other possibilities

## 2017-03-10 NOTE — Progress Notes (Signed)
   [facility] Kristen Mo, MD Phone: [phone]  Reason For Visit:   # Osteoarthritis -Patient with history of chronic back pain. Unchanged from previous, so notes having some knee pain as well. -Patient indicates she had significant improvement in pain with addition of tramadol. Recently she said she took one of her daughters Percocets when the pain was really bad. -Pain radiates down into her left leg and in her low back -No red flags -Patient would like a refill on pain medication -Initially placed a referral for sports medicine   # Clumsiness -She said recently she noted having significant clumsiness and has had some muscle cramps -She notes she tripped over a chair couple weeks ago - Denies any fevers or chills. Denies any tremor, no weakness or changes in sensation -She does endorse numbness in her left foot at times this is not new for her and related to her chronic back pain  Past Medical History Reviewed problem list.  Medications- reviewed and updated No additions to family history Social history- patient is a non-smoker  Objective: BP 125/80 (BP Location: Right Arm, Patient Position: Sitting, Cuff Size: Large)   Pulse 83   Temp 98.2 F (36.8 C) (Oral)   Ht 5\' 3"  (1.6 m)   Wt 196 lb (88.9 kg)   SpO2 96%   BMI 34.72 kg/m  Gen: NAD, alert, cooperative with exam Cardio: regular rate and rhythm, S1S2 heard, no murmurs appreciated Pulm: clear to auscultation bilaterally, no wheezes, rhonchi or rales Neuro: Neurologic exam : Cn 2-7 intact Strength equal & normal in upper & lower extremities Balance normal  finger to nose wnl No cog wheel rigidity, no gait abnormalities, no tremor     Assessment/Plan: See problem based a/p  Back pain Chronic back pain, sometimes knee pain as well. Followed up on sports medicine referral apparently this was denied and they indicated patient need it is to be seen by physical therapy. -Provide patient  with tramadol #20 pills; discussed with patient that based pain control is through activity -Placed a referral for physical therapy  Clumsiness Subjectively endorse clumsiness. Neurologic exam within normal limits. Patient walks with a cane but has been doing so for several years due to back pain- strength in legs is bilaterally 5 out of 5. No cogwheel rigidity, no tremor, no facies - making Parkinson's less likely given lack of symptoms. Normal neurologic exam therefore unlikely stroke no focal findings or focal symptoms. Patient has had numbness and tingling in her feet which is chronic we will check patient out for possible deficiencies such as B12 and folate. Unlikely neurosyphilis but will check for this- normal pupillary reaction. Unlikely other symptoms of tingling such deposition diseases will check CMET though

## 2017-03-11 ENCOUNTER — Encounter: Payer: Self-pay | Admitting: Internal Medicine

## 2017-03-11 DIAGNOSIS — R278 Other lack of coordination: Secondary | ICD-10-CM | POA: Insufficient documentation

## 2017-03-11 LAB — FOLATE: FOLATE: 6.3 ng/mL (ref >3.0)

## 2017-03-11 LAB — RPR: RPR: NONREACTIVE

## 2017-03-11 LAB — CMP14+EGFR
ALK PHOS: 97 IU/L (ref 39–117)
ALT: 11 IU/L (ref 0–32)
AST: 18 IU/L (ref 0–40)
Albumin/Globulin Ratio: 1.2 (ref 1.2–2.2)
Albumin: 3.7 g/dL (ref 3.5–5.5)
BUN / CREAT RATIO: 11 (ref 9–23)
BUN: 11 mg/dL (ref 6–24)
Bilirubin Total: 0.2 mg/dL (ref 0.0–1.2)
CO2: 24 mmol/L (ref 20–29)
Calcium: 9.1 mg/dL (ref 8.7–10.2)
Chloride: 103 mmol/L (ref 96–106)
Creatinine, Ser: 0.98 mg/dL (ref 0.57–1.00)
GFR calc Af Amer: 75 mL/min/{1.73_m2} (ref >59)
GFR calc non Af Amer: 65 mL/min/{1.73_m2} (ref >59)
GLOBULIN, TOTAL: 3.1 g/dL (ref 1.5–4.5)
Glucose: 93 mg/dL (ref 65–99)
Potassium: 4.5 mmol/L (ref 3.5–5.2)
SODIUM: 140 mmol/L (ref 134–144)
Total Protein: 6.8 g/dL (ref 6.0–8.5)

## 2017-03-11 LAB — VITAMIN B12: VITAMIN B 12: 952 pg/mL (ref 232–1245)

## 2017-03-11 NOTE — Assessment & Plan Note (Signed)
Chronic back pain, sometimes knee pain as well. Followed up on sports medicine referral apparently this was denied and they indicated patient need it is to be seen by physical therapy. -Provide patient with tramadol #20 pills; discussed with patient that based pain control is through activity -Placed a referral for physical therapy

## 2017-03-11 NOTE — Assessment & Plan Note (Signed)
Subjectively endorse clumsiness. Neurologic exam within normal limits. Patient walks with a cane but has been doing so for several years due to back pain- strength in legs is bilaterally 5 out of 5. No cogwheel rigidity, no tremor, no facies - making Parkinson's less likely given lack of symptoms. Normal neurologic exam therefore unlikely stroke no focal findings or focal symptoms. Patient has had numbness and tingling in her feet which is chronic we will check patient out for possible deficiencies such as B12 and folate. Unlikely neurosyphilis but will check for this- normal pupillary reaction. Unlikely other symptoms of tingling such deposition diseases will check CMET though

## 2017-03-25 ENCOUNTER — Encounter: Payer: Self-pay | Admitting: Physical Therapy

## 2017-03-25 ENCOUNTER — Ambulatory Visit: Payer: Medicare HMO | Attending: Family Medicine | Admitting: Physical Therapy

## 2017-03-25 DIAGNOSIS — G8929 Other chronic pain: Secondary | ICD-10-CM

## 2017-03-25 DIAGNOSIS — M545 Low back pain: Secondary | ICD-10-CM | POA: Diagnosis not present

## 2017-03-25 DIAGNOSIS — R262 Difficulty in walking, not elsewhere classified: Secondary | ICD-10-CM | POA: Diagnosis not present

## 2017-03-26 NOTE — Therapy (Addendum)
[place], [place], [address] Phone: [phone]   Fax:  [phone]  Physical Therapy Evaluation/ Discharge  Patient Details  Name: Kristen [name] MRN: [id number] Date of Birth: [birth year] Referring Provider: Dr Cletis Media    Encounter Date: 03/25/2017  PT End of Session - 03/25/17 1205    Visit Number  1    Number of Visits  16    Date for PT Re-Evaluation  05/21/17    Authorization Type  [employer] 40 dollar co-pay     PT Start Time  9476    PT Stop Time  1228    PT Time Calculation (min)  43 min    Activity Tolerance  Patient tolerated treatment well    Behavior During Therapy  [facility] for tasks assessed/performed       Past Medical History:  Diagnosis Date  . Allergy   . Back pain    Seen by Neurosurery  . Hyperlipidemia   . Hypertension   . Leg pain    Bil  . Obesity   . Peripheral neuropathy   . Post-menopausal   . Sleep apnea    no cpap    Past Surgical History:  Procedure Laterality Date  . carpel tunnel release Bilateral   . COLONOSCOPY    . LAMINECTOMY  01/20/2008   L4-L5 by Dr Latanya Maudlin (Ortho), [facility]  . POLYPECTOMY    . TUBAL LIGATION      There were no vitals filed for this visit.   Subjective Assessment - 03/25/17 1148    Subjective  Patient had a fall in a store about 4 years ago. At that point she began having lower back pain. She had surgery for a ruptured disc but has continued to have pain since that point. She also feels like she is off balanced. She uses a cane for mobility. She has pain that radiates down the front of both legs.     How long can you sit comfortably?  Has to adjust her position frequently     How long can you stand comfortably?  < 10 minuttes beofre pain     How long can you walk comfortably?  Has to hold onto the cart to walk around the store     Currently in Pain?  Yes    Pain Location  Back    Pain Orientation   Left;Mid    Pain Descriptors / Indicators  Aching    Pain Radiating Towards  radiating down the front of both legs     Pain Onset  More than a month ago    Pain Frequency  Constant    Aggravating Factors   Movement, standing, walking,     Pain Relieving Factors  tramadol;     Effect of Pain on Daily Activities  difficulty perfroming daily tasks          [facility] PT Assessment - 03/26/17 0001      Assessment   Medical Diagnosis  Low back pain     Referring Provider  Dr Cletis Media     Onset Date/Surgical Date  -- 4 years prior    4 years prior    Hand Dominance  Right    Next MD Visit  Nothing scheduled     Prior Therapy  Has had PT about 3 years ago.       Precautions   Precaution Comments  fall; patient reports decreased overall balance  Restrictions   Weight Bearing Restrictions  No      Balance Screen   Has the patient fallen in the past 6 months  No    Has the patient had a decrease in activity level because of a fear of falling?   No    Is the patient reluctant to leave their home because of a fear of falling?   No      Home Environment   Additional Comments  No steps at her house       Prior Function   Level of Independence  Independent    Vocation  Unemployed    Leisure  Would like to try to go back to the gym      Cognition   Overall Cognitive Status  Within Functional Limits for tasks assessed    Attention  Focused    Focused Attention  Appears intact    Memory  Appears intact    Awareness  Appears intact    Problem Solving  Appears intact    Executive Function  Reasoning      Observation/Other Assessments   Focus on Therapeutic Outcomes (FOTO)   64% limitation       Sensation   Light Touch  Appears Intact    Additional Comments  Numbness and tingling running down both legs ads well as pain. Worse at night       Coordination   Gross Motor Movements are Fluid and Coordinated  Yes    Fine Motor Movements are Fluid and Coordinated  Yes       Posture/Postural Control   Posture Comments  rounded shoulders; forward head      AROM   Lumbar Flexion  50% limited with a pull also limited by balance     Lumbar Extension  No pain with extension limited by balance 1`    Lumbar - Right Side Bend  No pain     Lumbar - Left Side Bend  No pain     Lumbar - Right Rotation  mild pain     Lumbar - Left Rotation  no pain       Strength   Right/Left Hip  Right;Left    Right Hip Flexion  3/5    Right Hip ABduction  3/5    Left Hip Flexion  3/5    Left Hip ABduction  3/5      Flexibility   Soft Tissue Assessment /Muscle Length  yes    Hamstrings  Bilateral 25 degrees 90/90       Palpation   Palpation comment  Tender to palpation on lumbar spine and into gluteals       Special Tests    Special Tests  Lumbar    Lumbar Tests  Straight Leg Raise      Straight Leg Raise   Comment  (-) bilateral       Transfers   Comments  Takes patient a few seconds to come form sit to stand and it causes her pain.       Ambulation/Gait   Gait Comments  Increased hip abduction bilateral; Decreased single leg stance time on the left; Decreased hip flexion on the right.       High Level Balance   High Level Balance Comments  tandem stance min a tandem eyes closed mod a; narrow base of support min a; NBOS EC mod/ Max A; can not maintain single leg stance bilatera;  Objective measurements completed on examination: See above findings.      [facility] Adult PT Treatment/Exercise - 03/26/17 0001      Lumbar Exercises: Stretches   Lower Trunk Rotation Limitations  x10 bilateral mod cuing     Piriformis Stretch Limitations  2x20 seconds bilateral       Lumbar Exercises: Supine   AB Set Limitations  reviewed abdpominal breathing; will need further cuing     Clam Limitations  x10 in low range yellow     Other Supine Lumbar Exercises  supine ball squeeze 2x10                PT Short Term Goals - 03/26/17 1003      PT SHORT  TERM GOAL #1   Title  Patient will demsotrate a good core cotraction without cuing     Time  4    Period  Weeks    Status  New    Target Date  04/23/17      PT SHORT TERM GOAL #2   Title  Patient will demsotrate a proper log roll without cuing     Time  4    Period  Weeks    Status  New    Target Date  04/23/17      PT SHORT TERM GOAL #3   Title  Patient will increase gross bilateral lower extremity strength to 4/5     Time  4    Period  Weeks    Status  New    Target Date  04/23/17      PT SHORT TERM GOAL #4   Title  Patient will increase passive pain free bilateral hip flexion to 100 degrees     Time  4    Period  Weeks    Status  New    Target Date  04/23/17        PT Long Term Goals - 03/26/17 1006      PT LONG TERM GOAL #1   Title  Patient will transfer sit to stand independently without pain     Time  8    Period  Weeks    Status  New    Target Date  05/21/17      PT LONG TERM GOAL #2   Title  Patient will ambualte 3000' without pain in order to go shopping     Time  8    Period  Weeks    Status  New    Target Date  05/21/17      PT LONG TERM GOAL #3   Title  Patient will stand for 1 hour without self reported increased pain in order to perfrom ADL's     Time  8    Period  Weeks    Status  New             Plan - 03/26/17 0959    Clinical Impression Statement  Patient is a 56 year old female who presents with decreased bilateral back and hip pain. She has limited hip motion bilateral R > L. She has significant lower extremity weakness. she has increased pain with activity and transfers. Therapy showed her a proper log roll.  She would benefit from skilled therapy to improve core and hip strength, and improve hip mobility.     History and Personal Factors relevant to plan of care:  history of low back surgery; neuropathy; obesity; basleine balance deficits     Clinical Presentation  Evolving    Clinical Presentation due to:  Increasing pain with  activity     Clinical Decision Making  Moderate    Rehab Potential  Fair    Clinical Impairments Affecting Rehab Potential  long standing back pain and balance deficits     PT Frequency  2x / week    PT Duration  8 weeks    PT Treatment/Interventions  ADLs/Self Care Home Management;Cryotherapy;Electrical Stimulation;Iontophoresis '4mg'$ /ml Dexamethasone;Moist Heat;Ultrasound;Therapeutic exercise;Therapeutic activities;Gait training;Neuromuscular re-education;Patient/family education;Passive range of motion;Dry needling;Taping;Manual techniques    PT Next Visit Plan  consider LAD bilateral; add seated hamstring stretch; will require cuing for abdominal breathing; add wquad and glut set; patient is very weak; consdier gait training; Posaterior hip joint sstretching to improve knee flexion;     PT Home Exercise Plan  LTR; pirifromis stretch; clamshell yellow and ball squeeze with breathing     Consulted and Agree with Plan of Care  Patient       Patient will benefit from skilled therapeutic intervention in order to improve the following deficits and impairments:  Abnormal gait, Decreased balance, Decreased activity tolerance, Difficulty walking, Decreased mobility, Improper body mechanics  Visit Diagnosis: Chronic bilateral low back pain without sciatica - Plan: PT plan of care cert/re-cert  Difficulty in walking, not elsewhere classified - Plan: PT plan of care cert/re-cert  G-Codes - 14/43/15 1012    Functional Assessment Tool Used (Outpatient Only)  FOTO, clinical decision making     Functional Limitation  Mobility: Walking and moving around    Mobility: Walking and Moving Around Current Status (Q0086)  At least 60 percent but less than 80 percent impaired, limited or restricted    Mobility: Walking and Moving Around Goal Status [phone])  At least 40 percent but less than 60 percent impaired, limited or restricted      PHYSICAL THERAPY DISCHARGE SUMMARY  Visits from Start of Care:  1  Current functional level related to goals / functional outcomes: Did not return for follow up    Remaining deficits: Unknown    Education / Equipment: Basic HEP   Plan: Patient agrees to discharge.  Patient goals were not met. Patient is being discharged due to not returning since the last visit.  ?????      Problem List Patient Active Problem List   Diagnosis Date Noted  . Clumsiness 03/11/2017  . Back pain 01/06/2017  . 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 Routine adult health maintenance 07/20/2014  . Bilateral edema of lower extremity 09/29/2013  . Chest pain, atypical 08/28/2012  . Headache 08/05/2012  . Obstructive sleep apnea 12/26/2011  . Fatigue 09/13/2011  . Prediabetes 07/18/2011  . Morbid obesity ([place]) 04/16/2010  . 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 GAIT DISTURBANCE 05/03/2009  . Allergic rhinitis 09/15/2008  . MENOPAUSAL SYNDROME 09/05/2008  . Hyperlipemia 06/12/2007  . HYPERTENSION, BENIGN SYSTEMIC 07/17/2006    Carney Living PT DPT  03/26/2017, 10:58 AM  [facility] [address] [place], [place], [address] Phone: [phone]   Fax:  [phone]  Name: Kristen [name] MRN: [id number] Date of Birth: 04-02-1961

## 2017-04-18 ENCOUNTER — Telehealth: Payer: Self-pay | Admitting: Internal Medicine

## 2017-04-18 NOTE — Telephone Encounter (Signed)
Pt needs handicapped placard form completed.  Please contact her when it is ready

## 2017-04-20 IMAGING — MG MM SCREENING BREAST TOMO BILATERAL
6 of 10 series · 6 of 26 positions shown · non-contrast
Comparison: Previous exam(s).

CLINICAL DATA: Screening.

EXAM:
DIGITAL SCREENING BILATERAL MAMMOGRAM WITH 3D TOMO WITH CAD

[R MLO (1 of 2)]
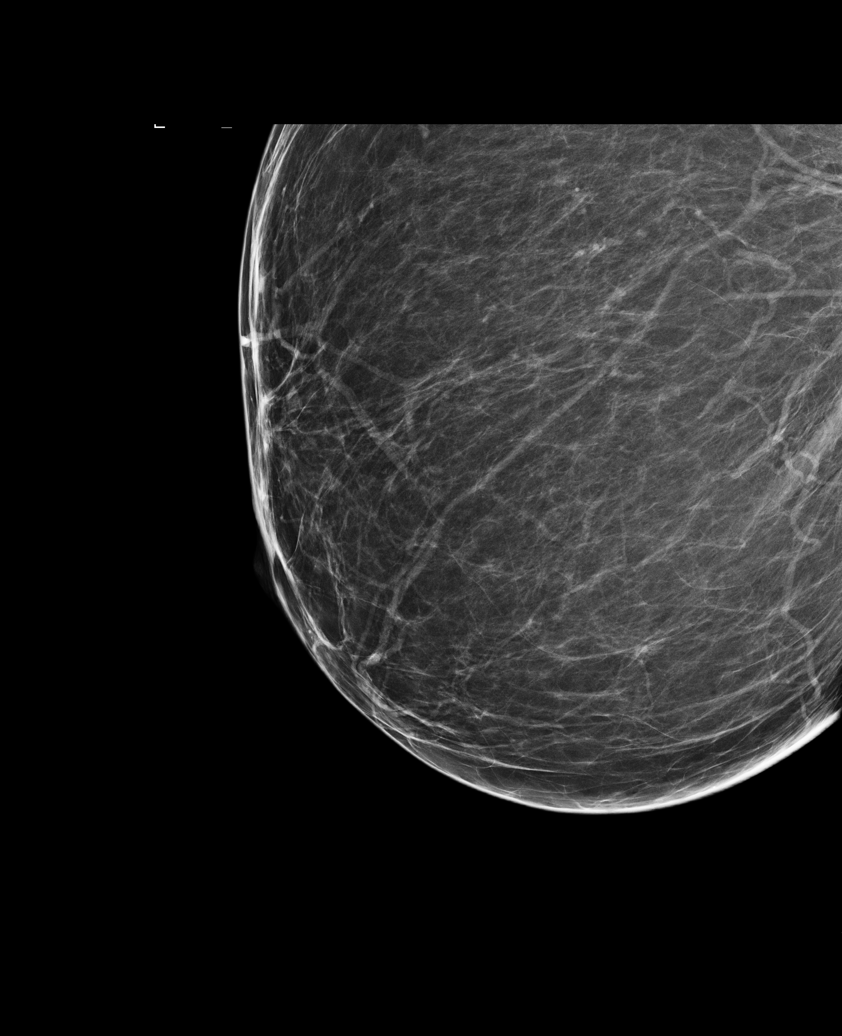

[L MLO (1 of 2)]
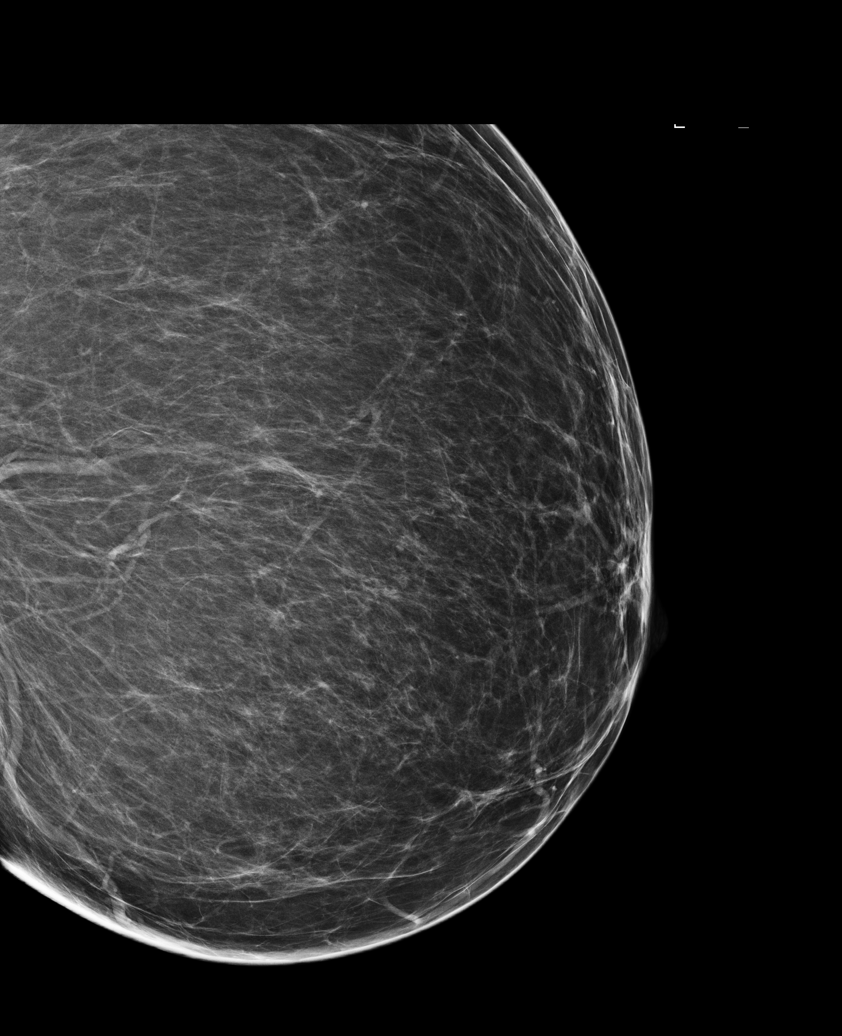

[L MLO (2 of 2)]
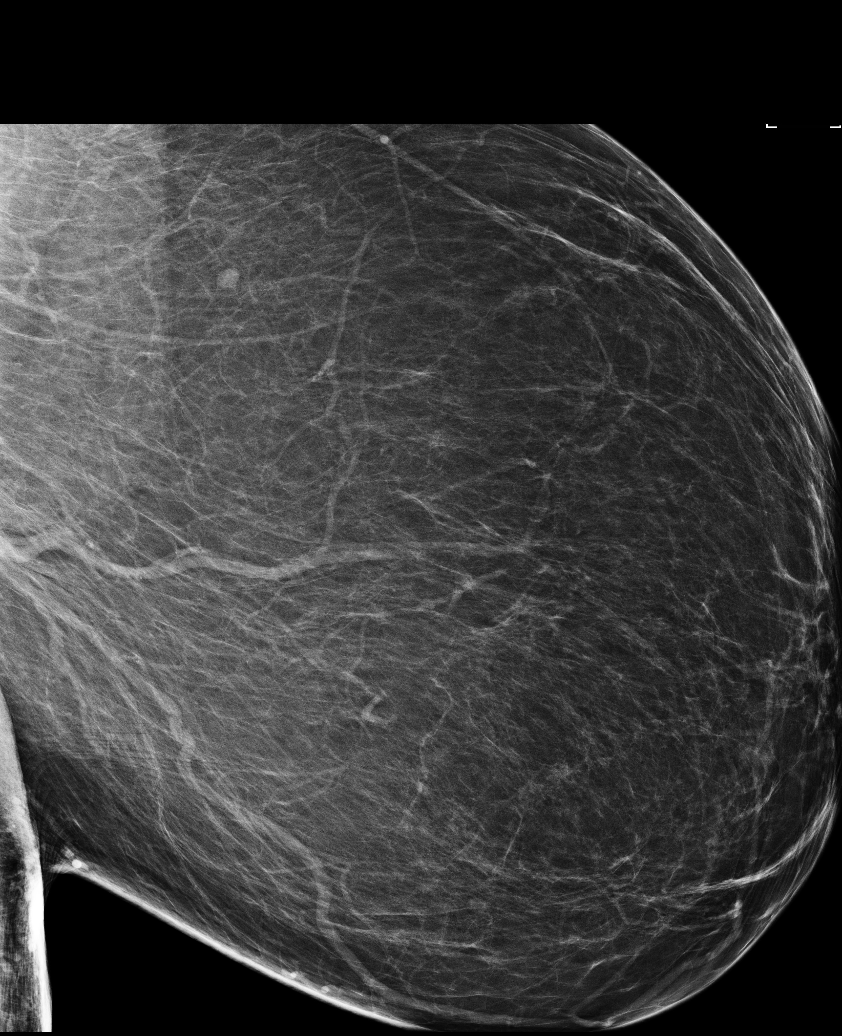

[R CC]
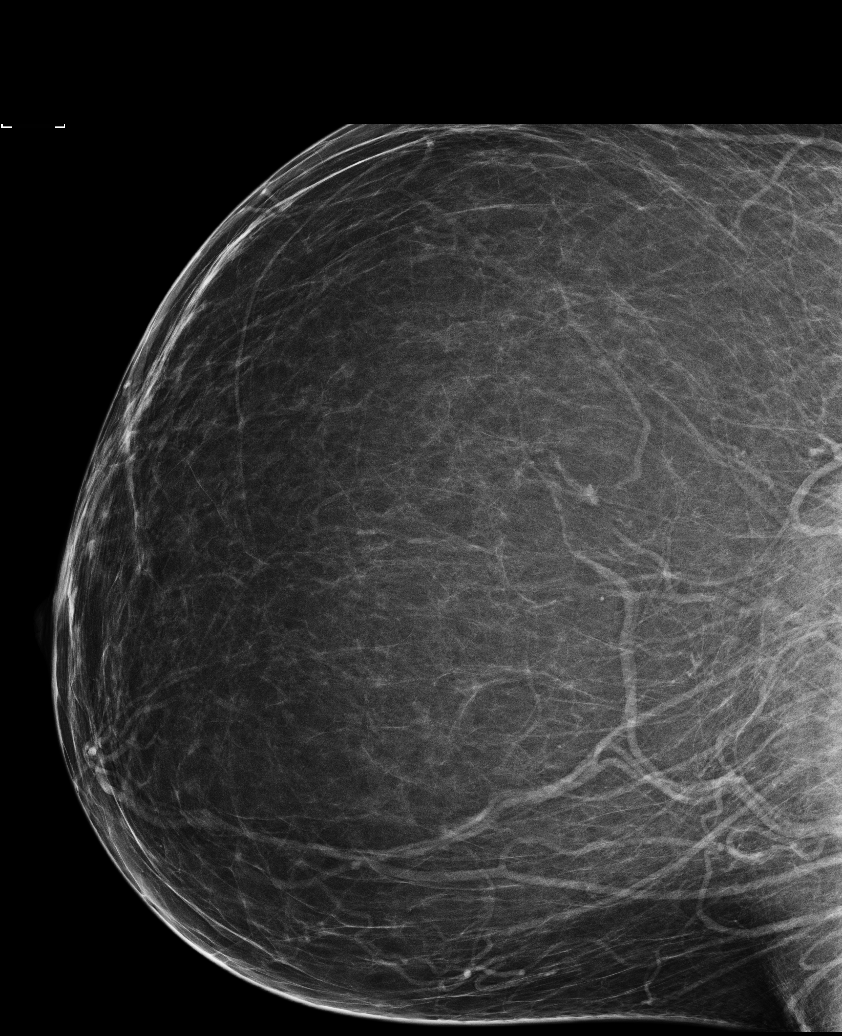

[R MLO (2 of 2)]
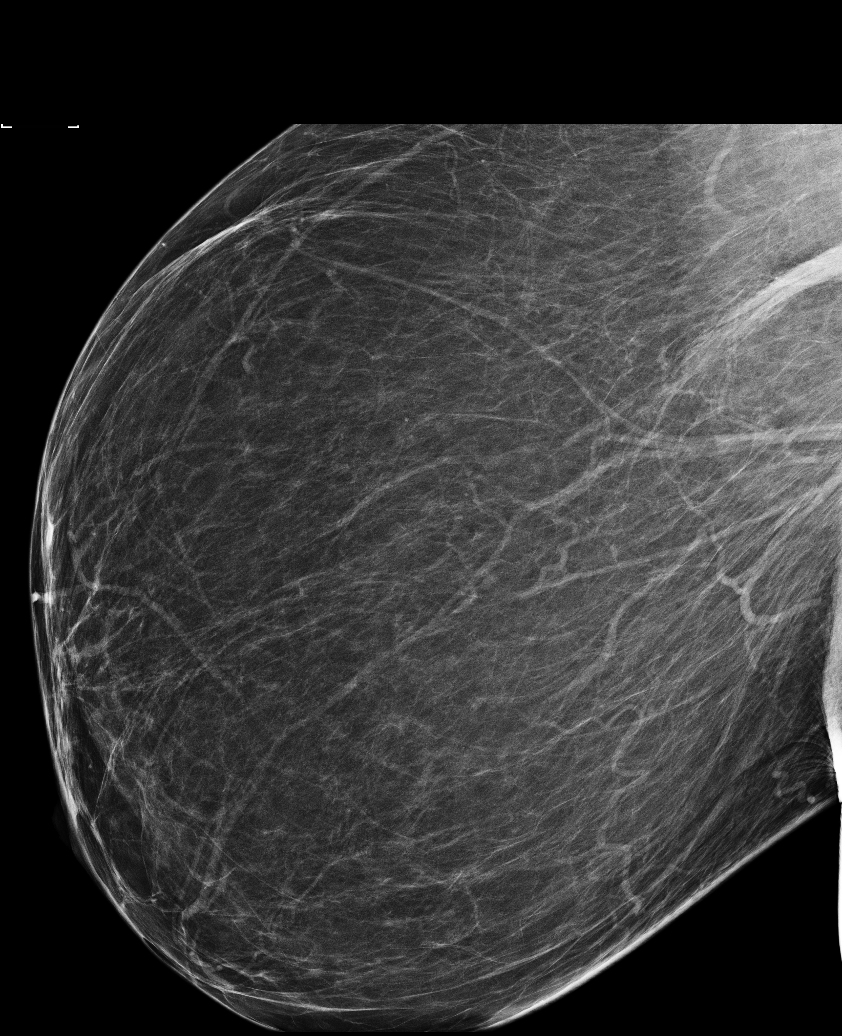

[L CC]
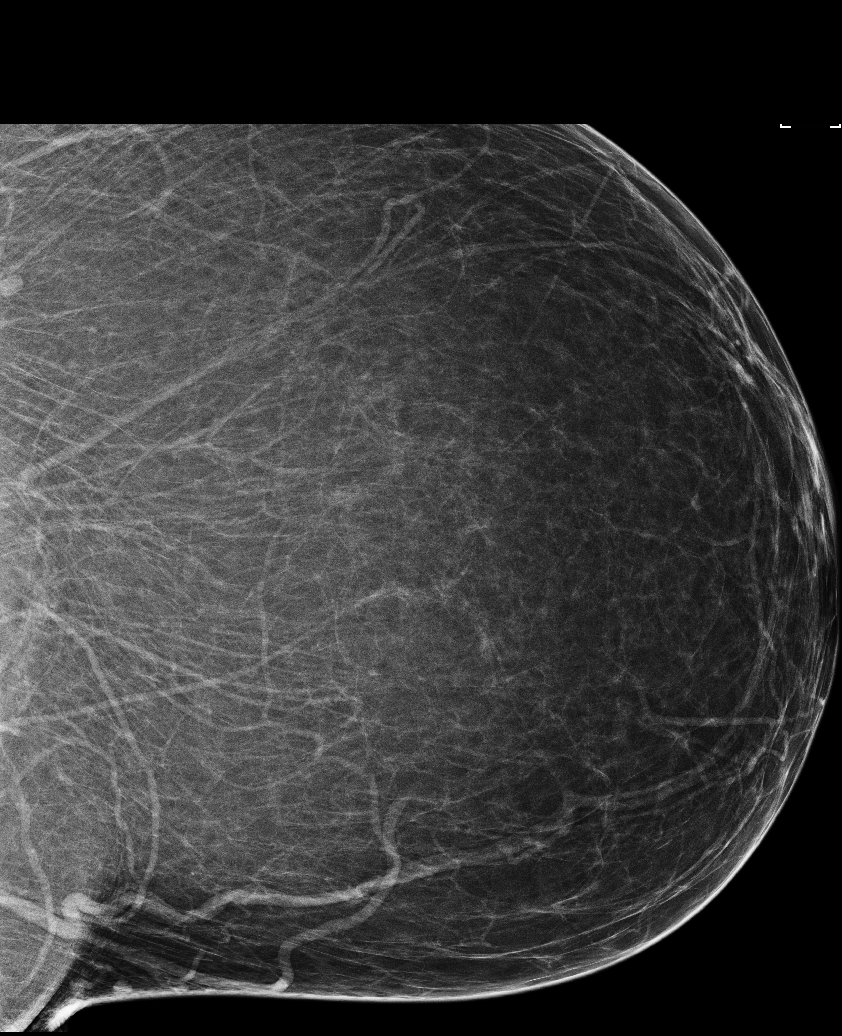

[6 of 26 positions shown; findings below may reference images not displayed]

ACR Breast Density Category b: There are scattered areas of
fibroglandular density.
FINDINGS: There are no findings suspicious for malignancy. Images were
processed with CAD.
IMPRESSION: No mammographic evidence of malignancy. A result letter of this
screening mammogram will be mailed directly to the patient.

RECOMMENDATION:
Screening mammogram in one year. (Code:55-L-23V)

BI-RADS CATEGORY  1: Negative.

## 2017-04-22 NOTE — Telephone Encounter (Signed)
Will forward to MD. Jazmin Hartsell,CMA  

## 2017-04-25 NOTE — Telephone Encounter (Deleted)
No documents noted in my box.

## 2017-04-29 ENCOUNTER — Other Ambulatory Visit: Payer: Self-pay | Admitting: Internal Medicine

## 2017-04-29 DIAGNOSIS — I10 Essential (primary) hypertension: Secondary | ICD-10-CM

## 2017-04-29 MED ORDER — LOSARTAN POTASSIUM 50 MG PO TABS: 50.0000 mg | ORAL_TABLET | Freq: Two times a day (BID) | ORAL | 1 refills | Status: DC

## 2017-04-29 NOTE — Telephone Encounter (Signed)
Please call Kristen [name] to let her know her disability placard is upfront

## 2017-04-29 NOTE — Telephone Encounter (Signed)
Form completed by clinic staff and placed in your box for signature and to fill out what patient's reason for this placard is.  Jazmin Hartsell,CMA

## 2017-04-30 NOTE — Telephone Encounter (Signed)
Pt informed. Deseree Blount, CMA  

## 2017-06-05 ENCOUNTER — Encounter: Payer: Self-pay | Admitting: Internal Medicine

## 2017-06-05 ENCOUNTER — Other Ambulatory Visit: Payer: Self-pay

## 2017-06-05 ENCOUNTER — Ambulatory Visit (INDEPENDENT_AMBULATORY_CARE_PROVIDER_SITE_OTHER): Payer: Medicare HMO | Admitting: Internal Medicine

## 2017-06-05 VITALS — BP 110/80 | HR 82 | Temp 98.1°F | Ht 63.0 in | Wt 191.4 lb

## 2017-06-05 DIAGNOSIS — R519 Headache, unspecified: Secondary | ICD-10-CM

## 2017-06-05 DIAGNOSIS — M5440 Lumbago with sciatica, unspecified side: Secondary | ICD-10-CM | POA: Diagnosis not present

## 2017-06-05 DIAGNOSIS — G8929 Other chronic pain: Secondary | ICD-10-CM

## 2017-06-05 DIAGNOSIS — N951 Menopausal and female climacteric states: Secondary | ICD-10-CM | POA: Diagnosis not present

## 2017-06-05 DIAGNOSIS — R51 Headache: Secondary | ICD-10-CM

## 2017-06-05 DIAGNOSIS — Z23 Encounter for immunization: Secondary | ICD-10-CM

## 2017-06-05 MED ORDER — TRAZODONE HCL 50 MG PO TABS: 25.0000 mg | ORAL_TABLET | Freq: Every evening | ORAL | 3 refills | Status: DC | PRN

## 2017-06-05 NOTE — Progress Notes (Signed)
   [facility] Kerrin Mo, MD Phone: [phone]  Reason For Visit: Follow up   # Menopausal symptoms  - Hot flashes, feeling irritable,  - Has tried oxybutynin, however it gave her cotton mouth  - Feel like she cant sleep at night  - Denies feeling super depressed all the time - states maybe once or twice a month   # Chronic Sciatic Pain  - Improved with physical therapy, symptoms are about the same, no significant changes. Has had a history of back surgery which did not provide much benefit. - Not hx of cancer, not bowel or bladder intolerance, no fever, - has been trying to loss weight with exercise, has lost weight   Past Medical History Reviewed problem list.  Medications- reviewed and updated No additions to family history Social history- patient is a non- smoker  Objective: BP 110/80   Pulse 82   Temp 98.1 F (36.7 C)   Ht 5\' 3"  (1.6 m)   Wt 191 lb 6.4 oz (86.8 kg)   BMI 33.90 kg/m  Gen: NAD, alert, cooperative with exam Cardio: regular rate and rhythm, S1S2 heard, no murmurs appreciated Pulm: clear to auscultation bilaterally, no wheezes, rhonchi or rales MSK: Ataxic gait,uses a cane to walk Skin: dry, intact, no rashes or lesions   Assessment/Plan: See problem based a/p  MENOPAUSAL SYNDROME Symptoms of hot flashes and irritability. Difficulty with sleeping at night.  Will use black cohosh and soy pills to help with symptoms Obtain general lab workup to ensure nothing else going on Follow up as needed in 1 month if no improvement  No history of breast cancer at an early age, blood clots, or smoking therefore could potentially do hormone replacement therapy in the future if need be.      Back pain Patient here with her chronic sciatic pain.  Nuys any worsening of the pain is about chronic.  No red flags noted. discussed may be getting imaging as she has not had this recently.  She would like to hold off on this.  She has been  doing physical therapy which has helped.  Tramadol did not help.  Gabapentin does help.  She does have some weight loss however states she has been trying to lose weight.

## 2017-06-05 NOTE — Addendum Note (Signed)
Addended by: Leonia Corona R on: 06/05/2017 02:55 PM   Modules accepted: Orders

## 2017-06-05 NOTE — Assessment & Plan Note (Addendum)
Patient here with her chronic sciatic pain.  Nuys any worsening of the pain is about chronic.  No red flags noted. discussed may be getting imaging as she has not had this recently.  She would like to hold off on this.  She has been doing physical therapy which has helped.  Tramadol did not help.  Gabapentin does help.  She does have some weight loss however states she has been trying to lose weight.

## 2017-06-05 NOTE — Patient Instructions (Addendum)
Please try black cohosh, you can get this from the pharmacy or health food store. You can take soy pills or drink soy milk to help with hot flashes. You can also try the trazdone to help with your sleep at night

## 2017-06-05 NOTE — Assessment & Plan Note (Signed)
Symptoms of hot flashes and irritability. Difficulty with sleeping at night.  Will use black cohosh and soy pills to help with symptoms Obtain general lab workup to ensure nothing else going on Follow up as needed in 1 month if no improvement  No history of breast cancer at an early age, blood clots, or smoking therefore could potentially do hormone replacement therapy in the future if need be.

## 2017-06-06 LAB — COMPREHENSIVE METABOLIC PANEL
A/G RATIO: 1.4 (ref 1.2–2.2)
ALBUMIN: 4.2 g/dL (ref 3.5–5.5)
ALK PHOS: 128 IU/L — AB (ref 39–117)
ALT: 14 IU/L (ref 0–32)
AST: 25 IU/L (ref 0–40)
BILIRUBIN TOTAL: 0.4 mg/dL (ref 0.0–1.2)
BUN / CREAT RATIO: 11 (ref 9–23)
BUN: 12 mg/dL (ref 6–24)
CHLORIDE: 98 mmol/L (ref 96–106)
CO2: 27 mmol/L (ref 20–29)
Calcium: 9.6 mg/dL (ref 8.7–10.2)
Creatinine, Ser: 1.09 mg/dL — ABNORMAL HIGH (ref 0.57–1.00)
GFR calc Af Amer: 66 mL/min/{1.73_m2} (ref >59)
GFR calc non Af Amer: 57 mL/min/{1.73_m2} — ABNORMAL LOW (ref >59)
GLOBULIN, TOTAL: 3.1 g/dL (ref 1.5–4.5)
GLUCOSE: 120 mg/dL — AB (ref 65–99)
POTASSIUM: 4.4 mmol/L (ref 3.5–5.2)
SODIUM: 140 mmol/L (ref 134–144)
Total Protein: 7.3 g/dL (ref 6.0–8.5)

## 2017-06-06 LAB — CBC
HEMOGLOBIN: 14.8 g/dL (ref 11.1–15.9)
Hematocrit: 45.5 % (ref 34.0–46.6)
MCH: 27 pg (ref 26.6–33.0)
MCHC: 32.5 g/dL (ref 31.5–35.7)
MCV: 83 fL (ref 79–97)
Platelets: 269 10*3/uL (ref 150–379)
RBC: 5.48 x10E6/uL — ABNORMAL HIGH (ref 3.77–5.28)
RDW: 15.3 % (ref 12.3–15.4)
WBC: 6.3 10*3/uL (ref 3.4–10.8)

## 2017-06-06 LAB — LIPID PANEL
CHOLESTEROL TOTAL: 262 mg/dL — AB (ref 100–199)
Chol/HDL Ratio: 5.5 ratio — ABNORMAL HIGH (ref 0.0–4.4)
HDL: 48 mg/dL (ref >39)
LDL Calculated: 194 mg/dL — ABNORMAL HIGH (ref 0–99)
Triglycerides: 98 mg/dL (ref 0–149)
VLDL Cholesterol Cal: 20 mg/dL (ref 5–40)

## 2017-06-13 ENCOUNTER — Encounter: Payer: Self-pay | Admitting: Internal Medicine

## 2017-06-16 ENCOUNTER — Encounter (HOSPITAL_COMMUNITY): Payer: Self-pay | Admitting: Emergency Medicine

## 2017-06-16 ENCOUNTER — Ambulatory Visit (HOSPITAL_COMMUNITY)
Admission: EM | Admit: 2017-06-16 | Discharge: 2017-06-16 | Disposition: A | Discharge status: Home / Self Care | Payer: Medicare HMO | Attending: Family Medicine | Admitting: Family Medicine

## 2017-06-16 DIAGNOSIS — R0789 Other chest pain: Secondary | ICD-10-CM

## 2017-06-16 DIAGNOSIS — R1011 Right upper quadrant pain: Secondary | ICD-10-CM | POA: Diagnosis not present

## 2017-06-16 DIAGNOSIS — R0781 Pleurodynia: Secondary | ICD-10-CM

## 2017-06-16 DIAGNOSIS — S39011A Strain of muscle, fascia and tendon of abdomen, initial encounter: Secondary | ICD-10-CM

## 2017-06-16 MED ORDER — DICLOFENAC SODIUM 75 MG PO TBEC: 75.0000 mg | DELAYED_RELEASE_TABLET | Freq: Two times a day (BID) | ORAL | 0 refills | Status: DC | PRN

## 2017-06-16 MED ORDER — CYCLOBENZAPRINE HCL 10 MG PO TABS: ORAL_TABLET | ORAL | 0 refills | Status: DC

## 2017-06-16 NOTE — Discharge Instructions (Addendum)
Recommend start Voltaren 75mg  twice a day as needed for pain- take with food. May also take Flexeril 10mg  tablet- take 1/2 to 1 whole tablet every 8 hours as needed for muscle pain/spasms- will cause drowsiness. May apply heat and alternate with cool compresses for comfort. Follow-up with your PCP in 3 days if not improving or go to the ER if symptoms worsen.

## 2017-06-16 NOTE — ED Provider Notes (Signed)
[place]    CSN: [id number] Arrival date & time: 06/16/17  0957     History   Chief Complaint Chief Complaint  Patient presents with  . Flank Pain    HPI Kristen [name] is a 57 y.o. female.   57 year old female presents with right upper quadrant to lower right rib pain that started 2 days ago. Pain is constant but worsens with any  movement/activity. No known injury or cause. Denies any fever, nausea, vomiting, diarrhea or urinary or unusual vaginal symptoms. No upper back or flank pain. Has tried Tylenol without relief.  Has history of HTN, Hyperlipidemia, sleep apnea, seasonal allergies, chronic back and leg pain and peripheral neuropathy. Currently on Coreg, Losartan, Neurontin, Trazodone, Cymbalta, aspirin, Flonase and Albuterol.    The history is provided by the patient.    Past Medical History:  Diagnosis Date  . Allergy   . Back pain    Seen by Neurosurery  . Hyperlipidemia   . Hypertension   . Leg pain    Bil  . Obesity   . Peripheral neuropathy   . Post-menopausal   . Sleep apnea    no cpap    Patient Active Problem List   Diagnosis Date Noted  . Back pain 01/06/2017  . 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 Routine adult health maintenance 07/20/2014  . Bilateral edema of lower extremity 09/29/2013  . Chest pain, atypical 08/28/2012  . Headache 08/05/2012  . Obstructive sleep apnea 12/26/2011  . Fatigue 09/13/2011  . Prediabetes 07/18/2011  . Morbid obesity ([place]) 04/16/2010  . 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 GAIT DISTURBANCE 05/03/2009  . Allergic rhinitis 09/15/2008  . MENOPAUSAL SYNDROME 09/05/2008  . Hyperlipemia 06/12/2007  . HYPERTENSION, BENIGN SYSTEMIC 07/17/2006    Past Surgical History:  Procedure Laterality Date  . carpel tunnel release Bilateral   . COLONOSCOPY    . LAMINECTOMY  01/20/2008   L4-L5 by Dr Latanya Maudlin (Ortho), [facility]  . POLYPECTOMY    . TUBAL LIGATION       OB History    No data available       Home Medications    Prior to Admission medications   Medication Sig Start Date End Date Taking? Authorizing Provider  albuterol (PROVENTIL HFA;VENTOLIN HFA) 108 (90 Base) MCG/ACT inhaler Inhale 1-2 puffs into the lungs every 6 (six) hours as needed for wheezing or shortness of breath. 04/03/16  Yes Archie Patten, MD  aspirin 81 MG chewable tablet Chew 1 tablet (81 mg total) by mouth daily. 09/07/15  Yes Leo Grosser, MD  carvedilol (COREG) 25 MG tablet Take 1 tablet (25 mg total) by mouth 2 (two) times daily with a meal. 01/06/17  Yes Mikell, Jeani Sow, MD  DULoxetine (CYMBALTA) 30 MG capsule Take 1 capsule (30 mg total) by mouth daily. 10/25/16  Yes Archie Patten, MD  fluticasone (FLONASE) 50 MCG/ACT nasal spray Place 2 sprays into both nostrils daily. Patient taking differently: Place 2 sprays into both nostrils daily as needed for allergies.  02/21/15  Yes Archie Patten, MD  gabapentin (NEURONTIN) 300 MG capsule Take 600mg  every afternoon and 900mg  qhs 05/30/16  Yes Bacigalupo, Dionne Bucy, MD  losartan (COZAAR) 50 MG tablet Take 1 tablet (50 mg total) by mouth 2 (two) times daily. 04/29/17  Yes Mikell, Jeani Sow, MD  traZODone (DESYREL) 50 MG tablet Take 0.5-1 tablets (25-50 mg total) by mouth at bedtime as needed for sleep. 06/05/17  Yes Mikell, Asiyah  Meredeth Ide, MD  cyclobenzaprine (FLEXERIL) 10 MG tablet Take 1/2 to 1 whole tablet by mouth every 8 hours as needed for muscle pain/spasms. 06/16/17   Katy Apo, NP  diclofenac (VOLTAREN) 75 MG EC tablet Take 1 tablet (75 mg total) by mouth 2 (two) times daily as needed for moderate pain. 06/16/17   Katy Apo, NP    Family History Family History  Problem Relation Age of Onset  . Lupus Mother   . Heart disease Mother   . Anemia Father   . Hypertension Father   . Arthritis Brother   . Asthma Son   . Lupus Daughter   . Colon cancer Neg Hx   . Colon polyps Neg Hx   .  Esophageal cancer Neg Hx   . Rectal cancer Neg Hx   . Stomach cancer Neg Hx   . Breast cancer Neg Hx     Social History Social History   Tobacco Use  . Smoking status: Former Smoker    Packs/day: 0.25    Years: 0.50    Pack years: 0.12    Types: Cigarettes    Last attempt to quit: 12/19/1990    Years since quitting: 26.5  . Smokeless tobacco: Never Used  . Tobacco comment: Quit 25 years ago.  Substance Use Topics  . Alcohol use: No    Alcohol/week: 0.0 oz    Comment: 05/29/16 none  . Drug use: Yes    Frequency: 4.0 times per week    Types: Marijuana    Comment: Helps with back pain, 05/29/16 three x day     Allergies   Amoxicillin and Penicillins   Review of Systems Review of Systems  Constitutional: Negative for activity change, appetite change, chills, fatigue and fever.  HENT: Negative for mouth sores, postnasal drip, sore throat and trouble swallowing.   Respiratory: Negative for cough, chest tightness, shortness of breath and wheezing.   Cardiovascular: Positive for chest pain (right lower rib pain).  Gastrointestinal: Positive for abdominal pain (right upper quadrant). Negative for blood in stool, diarrhea, nausea and vomiting.  Genitourinary: Negative for decreased urine volume, difficulty urinating, dysuria, flank pain, frequency, hematuria, urgency, vaginal discharge and vaginal pain.  Musculoskeletal: Positive for arthralgias, back pain and myalgias.  Skin: Negative for color change, rash and wound.  Allergic/Immunologic: Positive for environmental allergies.  Neurological: Negative for dizziness, seizures, syncope, weakness and headaches.  Hematological: Negative for adenopathy. Does not bruise/bleed easily.     Physical Exam Triage Vital Signs ED Triage Vitals  Enc Vitals Group     BP 06/16/17 1035 (!) 147/92     Pulse Rate 06/16/17 1035 75     Resp 06/16/17 1035 20     Temp 06/16/17 1035 97.8 F (36.6 C)     Temp Source 06/16/17 1035 Oral     SpO2  06/16/17 1035 100 %     Weight --      Height --      Head Circumference --      Peak Flow --      Pain Score 06/16/17 1036 8     Pain Loc --      Pain Edu? --      Excl. in [place]? --    No data found.  Updated Vital Signs BP (!) 147/92 (BP Location: Left Arm)   Pulse 75   Temp 97.8 F (36.6 C) (Oral)   Resp 20   SpO2 100%   Visual Acuity Right Eye Distance:  Left Eye Distance:   Bilateral Distance:    Right Eye Near:   Left Eye Near:    Bilateral Near:     Physical Exam  Constitutional: She is oriented to person, place, and time. She appears well-developed and well-nourished. No distress.  Patient sitting on exam table in no distress.   HENT:  Head: Normocephalic and atraumatic.  Right Ear: External ear normal.  Left Ear: External ear normal.  Eyes: Conjunctivae and EOM are normal.  Neck: Normal range of motion. Neck supple.  Cardiovascular: Normal rate, regular rhythm and normal heart sounds.  No murmur heard. Pulmonary/Chest: Effort normal and breath sounds normal. No stridor. No respiratory distress. She has no decreased breath sounds. She has no wheezes. She has no rhonchi. She has no rales. She exhibits tenderness (right lower rib tenderness). She exhibits no mass, no edema, no deformity and no swelling.  Abdominal: Soft. Normal appearance and bowel sounds are normal. She exhibits no pulsatile midline mass and no mass. There is no hepatosplenomegaly. There is tenderness in the right upper quadrant. There is no rigidity, no rebound, no guarding and no CVA tenderness. No hernia.    Neurological: She is alert and oriented to person, place, and time. She has normal strength.  Skin: Skin is warm and dry. No rash noted. No erythema.  Psychiatric: She has a normal mood and affect. Her behavior is normal. Judgment and thought content normal.     UC Treatments / Results  Labs (all labs ordered are listed, but only abnormal results are displayed) Labs Reviewed - No data  to display  EKG  EKG Interpretation None       Radiology No results found.  Procedures Procedures (including critical care time)  Medications Ordered in UC Medications - No data to display   Initial Impression / Assessment and Plan / UC Course  I have reviewed the triage vital signs and the nursing notes.  Pertinent labs & imaging results that were available during my care of the patient were reviewed by me and considered in my medical decision making (see chart for details).    Reviewed with patient that she appears to have a muscle strain since pain is worse with movement and tender on the rib. However, discussed other etiologies including gallbladder or liver disease. Recommend start Voltaren 75mg  twice a day as needed for pain- take with food. May also take Flexeril 10mg  tablet- take 1/2 to 1 whole tablet every 8 hours as needed for muscle pain/spasms- will cause drowsiness. May apply heat and alternate with cool compresses for comfort. Follow-up with her PCP in 3 days if not improving or go to the ER if symptoms worsen.    Final Clinical Impressions(s) / UC Diagnoses   Final diagnoses:  Abdominal pain, right upper quadrant  Rib pain on right side  Abdominal muscle strain, initial encounter    ED Discharge Orders        Ordered    diclofenac (VOLTAREN) 75 MG EC tablet  2 times daily PRN     06/16/17 1110    cyclobenzaprine (FLEXERIL) 10 MG tablet     06/16/17 1110       Controlled Substance Prescriptions [place] Controlled Substance Registry consulted? Not Applicable   Katy Apo, NP 06/16/17 2324

## 2017-06-16 NOTE — ED Triage Notes (Signed)
PT C/O: constant right flank pain onset 2 days that increases w/activity   DENIES: urinary sx, n/v/d, fevers  TAKING MEDS: none   A&O x4... NAD... Ambulatory

## 2017-06-16 NOTE — ED Notes (Signed)
PT made aware she cannot drive or operate machinery while taking muscle relaxer

## 2017-06-25 ENCOUNTER — Ambulatory Visit: Payer: Medicare HMO | Admitting: Family Medicine

## 2017-06-27 ENCOUNTER — Ambulatory Visit: Payer: Medicare HMO | Admitting: Internal Medicine

## 2017-07-08 ENCOUNTER — Other Ambulatory Visit: Payer: Self-pay

## 2017-07-08 ENCOUNTER — Ambulatory Visit (INDEPENDENT_AMBULATORY_CARE_PROVIDER_SITE_OTHER): Payer: Medicare HMO | Admitting: Internal Medicine

## 2017-07-08 ENCOUNTER — Other Ambulatory Visit: Payer: Self-pay | Admitting: Family Medicine

## 2017-07-08 ENCOUNTER — Encounter: Payer: Self-pay | Admitting: Internal Medicine

## 2017-07-08 VITALS — BP 138/84 | HR 86 | Temp 98.1°F | Ht 63.0 in | Wt 195.0 lb

## 2017-07-08 DIAGNOSIS — E785 Hyperlipidemia, unspecified: Secondary | ICD-10-CM

## 2017-07-08 DIAGNOSIS — R10811 Right upper quadrant abdominal tenderness: Secondary | ICD-10-CM | POA: Diagnosis not present

## 2017-07-08 DIAGNOSIS — Z1231 Encounter for screening mammogram for malignant neoplasm of breast: Secondary | ICD-10-CM

## 2017-07-08 MED ORDER — PANTOPRAZOLE SODIUM 40 MG PO TBEC
40.0000 mg | DELAYED_RELEASE_TABLET | Freq: Every day | ORAL | 0 refills | Status: DC
Start: 2017-07-08 — End: 2017-08-11

## 2017-07-08 MED ORDER — MELOXICAM 7.5 MG PO TABS: 7.5000 mg | ORAL_TABLET | Freq: Every day | ORAL | 0 refills | Status: DC

## 2017-07-08 MED ORDER — ATORVASTATIN CALCIUM 40 MG PO TABS
40.0000 mg | ORAL_TABLET | Freq: Every day | ORAL | 3 refills | Status: DC
Start: 2017-07-08 — End: 2017-09-17

## 2017-07-08 NOTE — Patient Instructions (Addendum)
I am going to evaluate you for right upper quadrant pain.  I do not think that this is likely abdominal think that it is probably radicular pain from facet arthropathy.

## 2017-07-08 NOTE — Progress Notes (Signed)
   [facility] Kerrin Mo, MD Phone: [phone]  Reason For Visit: SDA for Abdominal Pain   # ABDOMINAL PAIN  Patient states she has been having pain along her right side for the past month.  She was seen in the urgent care for pain which was thought to be likely musculoskeletal.  She was prescribed Voltaren and Flexeril she states these have not helped.  She states that the pain is right upper quadrant not associated with food.  It comes on and lasts for about 20 minutes sporadically throughout the day and occurs after eating.  She states that it is a severe stabbing pain.  She denies any nausea or vomiting associated with this pain.  Though she does indicate she sometimes will regurgitate her food during the day while she eating thought not associated with this abdominal pain. This is been ongoing for the last year or so.   Symptoms Nausea/vomiting: None  Diarrhea:  None Constipation: None Blood in stool: None  Blood in vomit:None Fever: None  Dysuria: None  Loss of appetite: None  Weight loss: None  She does have a significant history of chronic back pain and sciatic pain  Past Medical History Reviewed problem list.  Medications- reviewed and updated No additions to family history Social history- patient is a non- smoker  Objective: BP 138/84   Pulse 86   Temp 98.1 F (36.7 C) (Oral)   Ht 5\' 3"  (1.6 m)   Wt 195 lb (88.5 kg)   SpO2 90%   BMI 34.54 kg/m  Gen: NAD, alert, cooperative with exam Cardio: regular rate and rhythm, S1S2 heard, no murmurs appreciated Pulm: clear to auscultation bilaterally, no wheezes, rhonchi or rales GI: soft, non-tender, non-distended, bowel sounds present, no hepatomegaly, no splenomegaly Skin: dry, intact, no rashes or lesions   Assessment/Plan: See problem based a/p  Hyperlipemia Lipid panel obtained last visit ASCVD risk requiring placement on statin Start atorvastatin  Right upper quadrant abdominal  tenderness Given this pain is a daily pain that lasts for about 20 minutes not associated with food specifically fatty foods unlikely to be her gallbladder.  She denies any associated nausea or vomiting with this pain.  She does indicate having some regurgitation with her meal which could be a sign of reflux.  Though she denies any particular burning pain or radiation of pain.  Denies any issues with constipation or diarrhea.  Given this pain has been going on for about a month unlikely to be a kidney stone.  Most likely the cause of this pain is facet arthritis.  Patient has a long-standing history of back pain she, she has had a laminectomy, she has had chronic pain with sciatica at times for the past several years-therefore will treat her with.  Will do an abdominal workup given differential discussed above to ensure that there is no other cause for this pain.  - US ABDOMEN LIMITED RUQ; Future - Lipase - CBC - pantoprazole (PROTONIX) 40 MG tablet; Take 1 tablet (40 mg total) by mouth daily.  Dispense: 30 tablet; Refill: 0 - meloxicam (MOBIC) 7.5 MG tablet; Take 1 tablet (7.5 mg total) by mouth daily.  Dispense: 15 tablet; Refill: 0 - Comprehensive metabolic panel

## 2017-07-09 LAB — COMPREHENSIVE METABOLIC PANEL
ALK PHOS: 108 IU/L (ref 39–117)
ALT: 11 IU/L (ref 0–32)
AST: 15 IU/L (ref 0–40)
Albumin/Globulin Ratio: 1.3 (ref 1.2–2.2)
Albumin: 3.7 g/dL (ref 3.5–5.5)
BILIRUBIN TOTAL: 0.2 mg/dL (ref 0.0–1.2)
BUN/Creatinine Ratio: 9 (ref 9–23)
BUN: 9 mg/dL (ref 6–24)
CO2: 27 mmol/L (ref 20–29)
Calcium: 9.1 mg/dL (ref 8.7–10.2)
Chloride: 102 mmol/L (ref 96–106)
Creatinine, Ser: 0.96 mg/dL (ref 0.57–1.00)
GFR calc non Af Amer: 67 mL/min/{1.73_m2} (ref >59)
GFR, EST AFRICAN AMERICAN: 77 mL/min/{1.73_m2} (ref >59)
GLUCOSE: 107 mg/dL — AB (ref 65–99)
Globulin, Total: 2.8 g/dL (ref 1.5–4.5)
POTASSIUM: 4.4 mmol/L (ref 3.5–5.2)
Sodium: 142 mmol/L (ref 134–144)
TOTAL PROTEIN: 6.5 g/dL (ref 6.0–8.5)

## 2017-07-09 LAB — CBC
Hematocrit: 42.8 % (ref 34.0–46.6)
Hemoglobin: 13.6 g/dL (ref 11.1–15.9)
MCH: 27.1 pg (ref 26.6–33.0)
MCHC: 31.8 g/dL (ref 31.5–35.7)
MCV: 85 fL (ref 79–97)
PLATELETS: 270 10*3/uL (ref 150–379)
RBC: 5.01 x10E6/uL (ref 3.77–5.28)
RDW: 15.2 % (ref 12.3–15.4)
WBC: 7.2 10*3/uL (ref 3.4–10.8)

## 2017-07-09 LAB — LIPASE: Lipase: 24 U/L (ref 14–72)

## 2017-07-11 ENCOUNTER — Ambulatory Visit (HOSPITAL_COMMUNITY)
Admission: RE | Admit: 2017-07-11 | Discharge: 2017-07-11 | Disposition: A | Discharge status: Home / Self Care | Payer: Medicare HMO | Source: Ambulatory Visit | Attending: Family Medicine | Admitting: Family Medicine

## 2017-07-11 DIAGNOSIS — R1011 Right upper quadrant pain: Secondary | ICD-10-CM | POA: Diagnosis present

## 2017-07-11 DIAGNOSIS — K8020 Calculus of gallbladder without cholecystitis without obstruction: Secondary | ICD-10-CM | POA: Diagnosis not present

## 2017-07-11 DIAGNOSIS — R10811 Right upper quadrant abdominal tenderness: Secondary | ICD-10-CM | POA: Diagnosis not present

## 2017-07-11 NOTE — Assessment & Plan Note (Addendum)
Given this pain is a daily pain that lasts for about 20 minutes not associated with food specifically fatty foods unlikely to be her gallbladder.  She denies any associated nausea or vomiting with this pain.  She does indicate having some regurgitation with her meal which could be a sign of reflux.  Though she denies any particular burning pain or radiation of pain.  Denies any issues with constipation or diarrhea.  Given this pain has been going on for about a month unlikely to be a kidney stone.  Most likely the cause of this pain is facet arthritis.  Patient has a long-standing history of back pain she, she has had a laminectomy, she has had chronic pain with sciatica at times for the past several years-therefore will treat her with.  Will do an abdominal workup given differential discussed above to ensure that there is no other cause for this pain.  - US ABDOMEN LIMITED RUQ; Future - Lipase - CBC - pantoprazole (PROTONIX) 40 MG tablet; Take 1 tablet (40 mg total) by mouth daily.  Dispense: 30 tablet; Refill: 0 - meloxicam (MOBIC) 7.5 MG tablet; Take 1 tablet (7.5 mg total) by mouth daily.  Dispense: 15 tablet; Refill: 0 - Comprehensive metabolic panel

## 2017-07-11 NOTE — Assessment & Plan Note (Signed)
Lipid panel obtained last visit ASCVD risk requiring placement on statin Start atorvastatin

## 2017-07-14 ENCOUNTER — Encounter: Payer: Self-pay | Admitting: Internal Medicine

## 2017-07-14 ENCOUNTER — Telehealth: Payer: Self-pay | Admitting: Internal Medicine

## 2017-07-14 NOTE — Telephone Encounter (Signed)
Tried to call patient regarding her ultrasound.  No voicemail was available.  Will give her a call again later this week to discuss ultrasound results

## 2017-07-25 ENCOUNTER — Encounter: Payer: Self-pay | Admitting: Internal Medicine

## 2017-07-25 ENCOUNTER — Telehealth: Payer: Self-pay | Admitting: Internal Medicine

## 2017-07-25 NOTE — Telephone Encounter (Signed)
Unable to get in touch with patient through phone. Will send letter to house with results

## 2017-07-30 DIAGNOSIS — R69 Illness, unspecified: Secondary | ICD-10-CM | POA: Diagnosis not present

## 2017-08-04 NOTE — Telephone Encounter (Signed)
Pt called nurse line, would like to speak to MD regarding her results. Her call back number [phone] Wallace Cullens, RN

## 2017-08-05 NOTE — Telephone Encounter (Signed)
Called patient and let her know she needs to make an appointment to talk about next steps

## 2017-08-11 ENCOUNTER — Other Ambulatory Visit: Payer: Self-pay

## 2017-08-11 ENCOUNTER — Encounter: Payer: Self-pay | Admitting: Internal Medicine

## 2017-08-11 ENCOUNTER — Ambulatory Visit (INDEPENDENT_AMBULATORY_CARE_PROVIDER_SITE_OTHER): Payer: Medicare HMO | Admitting: Internal Medicine

## 2017-08-11 ENCOUNTER — Ambulatory Visit
Admission: RE | Admit: 2017-08-11 | Discharge: 2017-08-11 | Disposition: A | Discharge status: Home / Self Care | Payer: Medicare HMO | Source: Ambulatory Visit | Attending: Family Medicine | Admitting: Family Medicine

## 2017-08-11 DIAGNOSIS — Z1231 Encounter for screening mammogram for malignant neoplasm of breast: Secondary | ICD-10-CM

## 2017-08-11 DIAGNOSIS — R10811 Right upper quadrant abdominal tenderness: Secondary | ICD-10-CM | POA: Diagnosis not present

## 2017-08-11 MED ORDER — MELOXICAM 7.5 MG PO TABS: 7.5000 mg | ORAL_TABLET | Freq: Every day | ORAL | 0 refills | Status: DC

## 2017-08-11 NOTE — Progress Notes (Signed)
   Subjective:   Patient: Kristen [name]       Birthdate: Jun 08, 1960       MRN: [id number]      HPI  Kristen [name] is a 57 y.o. female presenting for f/u of abd pain.   F/u abd pain Patient last seen on 02/19 by Dr. Emmaline Life for abd pain on R side x52mo. Had previously been seen at urgent care for this issue which was thought to be MSK at the time. Pain was lasting intermittently for periods of 20 min and did not seem to be associated with foods. RUQ [place] was obtained as well as labwork, and patient was started on pantoprazole and Mobic. Labs showed no abnormalities, including no elevations in lipase of LFTs. RUQ [place] consistent with cholelithiasis.  Today, patient reports significant improvement in pain. Is only having pain about once a week, and pain is not as severe as at prior appt. Still lasts for about 20 min at a time and does not seem to be related to food.  Takes Mobic when she has pain which helps a lot. Has been taking Protonix and does not think this is making a difference. Still has nausea and vomiting with pain, but also at other times as well. This is not worsening.   Smoking status reviewed. Patient is former smoker.   Review of Systems See HPI.     Objective:  Physical Exam  Constitutional: She is oriented to person, place, and time and well-developed, well-nourished, and in no distress.  HENT:  Head: Normocephalic and atraumatic.  Pulmonary/Chest: Effort normal. No respiratory distress.  Neurological: She is alert and oriented to person, place, and time.  Skin: Skin is warm and dry.  Psychiatric: Affect and judgment normal.   Assessment & Plan:  Right upper quadrant abdominal tenderness Significantly improved. Cholelithiasis noted on [place], which, paired with normal labs otherwise, is most likely cause of episodic pain. Still having pain about once weekly, though this is not as severe as prior and improves with Mobic. Discussed referral to surgery to discuss  cholecystectomy. Patient wishing to avoid surgery at this time, and continue to monitor symptoms. Discussed that this is certainly an acceptable approach, however she should be aware that symptoms may persist and can worsen again. Patient aware of this and said she would call if pain worsens or becomes more frequent again. Informed patient that we would be happy to send in a surgery referral if she so desires without seeing her again specifically for this issue.    Adin Hector, MD, MPH PGY-3 [facility] Pager [phone]

## 2017-08-11 NOTE — Assessment & Plan Note (Signed)
Significantly improved. Cholelithiasis noted on [place], which, paired with normal labs otherwise, is most likely cause of episodic pain. Still having pain about once weekly, though this is not as severe as prior and improves with Mobic. Discussed referral to surgery to discuss cholecystectomy. Patient wishing to avoid surgery at this time, and continue to monitor symptoms. Discussed that this is certainly an acceptable approach, however she should be aware that symptoms may persist and can worsen again. Patient aware of this and said she would call if pain worsens or becomes more frequent again. Informed patient that we would be happy to send in a surgery referral if she so desires without seeing her again specifically for this issue.

## 2017-08-11 NOTE — Patient Instructions (Addendum)
It was nice meeting you today Ms. Sobecki!  You can continue to take Mobic as needed for abdominal pain. If the pain worsens or starts to happen more frequently, please call to let us know. We will be happy to send in a surgery referral for you if you would like to discuss having your gallbladder removed.   If you have any questions or concerns, please feel free to call the clinic.   Be well,  Dr. Avon Gully

## 2017-08-27 ENCOUNTER — Telehealth: Payer: Self-pay

## 2017-08-27 NOTE — Telephone Encounter (Signed)
Pt called nurse line, I advised patient per her mammogram 08/11/17 that it was negative and to repeat a screening in 1 year. Pt reassured. Wallace Cullens, RN

## 2017-08-27 NOTE — Telephone Encounter (Signed)
Pt left message on nurse line stating she received a letter from the breast center telling her to contact MD about results. Per mammogram from 08/11/17 mammogram was negative. Attempted to return patients call to clarify- no answer. Left message to call back Wallace Cullens, RN

## 2017-09-05 ENCOUNTER — Other Ambulatory Visit: Payer: Self-pay

## 2017-09-05 ENCOUNTER — Encounter (HOSPITAL_COMMUNITY): Payer: Self-pay | Admitting: Emergency Medicine

## 2017-09-05 ENCOUNTER — Emergency Department (HOSPITAL_COMMUNITY): Payer: Medicare HMO

## 2017-09-05 DIAGNOSIS — Z87891 Personal history of nicotine dependence: Secondary | ICD-10-CM | POA: Insufficient documentation

## 2017-09-05 DIAGNOSIS — R091 Pleurisy: Secondary | ICD-10-CM | POA: Insufficient documentation

## 2017-09-05 DIAGNOSIS — I10 Essential (primary) hypertension: Secondary | ICD-10-CM | POA: Diagnosis not present

## 2017-09-05 DIAGNOSIS — Z7982 Long term (current) use of aspirin: Secondary | ICD-10-CM | POA: Diagnosis not present

## 2017-09-05 DIAGNOSIS — Z79899 Other long term (current) drug therapy: Secondary | ICD-10-CM | POA: Insufficient documentation

## 2017-09-05 DIAGNOSIS — R079 Chest pain, unspecified: Secondary | ICD-10-CM | POA: Diagnosis not present

## 2017-09-05 DIAGNOSIS — R0789 Other chest pain: Secondary | ICD-10-CM | POA: Diagnosis not present

## 2017-09-05 DIAGNOSIS — R05 Cough: Secondary | ICD-10-CM | POA: Diagnosis not present

## 2017-09-05 NOTE — ED Triage Notes (Signed)
Pt comes in with complaints of right sided chest pain that goes into her back and into her shoulder. Patient states she has had a cough for a while and two days ago began having right breast pain with right sided chest pain. Patient does not complains of SOB, light headedness, dizziness, N/V. Patient states that coughing and deep breathing make the pain worse.

## 2017-09-06 ENCOUNTER — Emergency Department (HOSPITAL_COMMUNITY)
Admission: EM | Admit: 2017-09-06 | Discharge: 2017-09-06 | Disposition: A | Discharge status: Home / Self Care | Payer: Medicare HMO | Attending: Emergency Medicine | Admitting: Emergency Medicine

## 2017-09-06 DIAGNOSIS — R091 Pleurisy: Secondary | ICD-10-CM

## 2017-09-06 LAB — BASIC METABOLIC PANEL
Anion gap: 8 (ref 5–15)
BUN: 9 mg/dL (ref 6–20)
CHLORIDE: 106 mmol/L (ref 101–111)
CO2: 25 mmol/L (ref 22–32)
Calcium: 9.1 mg/dL (ref 8.9–10.3)
Creatinine, Ser: 0.94 mg/dL (ref 0.44–1.00)
GFR calc Af Amer: >60 mL/min (ref >60)
GLUCOSE: 108 mg/dL — AB (ref 65–99)
POTASSIUM: 3.7 mmol/L (ref 3.5–5.1)
Sodium: 139 mmol/L (ref 135–145)

## 2017-09-06 LAB — CBC
HEMATOCRIT: 43.7 % (ref 36.0–46.0)
Hemoglobin: 13.9 g/dL (ref 12.0–15.0)
MCH: 27.1 pg (ref 26.0–34.0)
MCHC: 31.8 g/dL (ref 30.0–36.0)
MCV: 85.4 fL (ref 78.0–100.0)
Platelets: 244 10*3/uL (ref 150–400)
RBC: 5.12 MIL/uL — ABNORMAL HIGH (ref 3.87–5.11)
RDW: 15.3 % (ref 11.5–15.5)
WBC: 8 10*3/uL (ref 4.0–10.5)

## 2017-09-06 LAB — I-STAT TROPONIN, ED
TROPONIN I, POC: 0 ng/mL (ref 0.00–0.08)
Troponin i, poc: 0 ng/mL (ref 0.00–0.08)

## 2017-09-06 LAB — D-DIMER, QUANTITATIVE: D-Dimer, Quant: 0.44 ug/mL-FEU (ref 0.00–0.50)

## 2017-09-06 MED ORDER — ASPIRIN 81 MG PO CHEW
324.0000 mg | CHEWABLE_TABLET | Freq: Once | ORAL | Status: AC
  Administered 2017-09-06: 324 mg via ORAL
  Filled 2017-09-06: qty 4

## 2017-09-06 MED ORDER — NAPROXEN 500 MG PO TABS: 500.0000 mg | ORAL_TABLET | Freq: Two times a day (BID) | ORAL | 0 refills | Status: DC

## 2017-09-06 MED ORDER — MORPHINE SULFATE (PF) 4 MG/ML IV SOLN: 4.0000 mg | Freq: Once | INTRAVENOUS | Status: DC

## 2017-09-06 NOTE — Discharge Instructions (Addendum)
We saw you in the ER for the chest pain/shortness of breath. ?All of our cardiac workup is normal, including labs, EKG and chest X-RAY are normal. ?We are not sure what is causing your discomfort, but we feel comfortable sending you home at this time. The workup in the ER is not complete, and you should follow up with your primary care doctor for further evaluation. ? ?Please return to the ER if you have worsening chest pain, shortness of breath, pain radiating to your jaw, shoulder, or back, sweats or fainting.  ?

## 2017-09-06 NOTE — ED Provider Notes (Signed)
[facility] DEPT Provider Note   CSN: [id number] Arrival date & time: 09/05/17  2119     History   Chief Complaint Chief Complaint  Patient presents with  . Chest Pain    Right Sided  . Cough    HPI Kristen [name] is a 57 y.o. female.  HPI 57 year old female with history of hypertension, hyperlipidemia comes in with chief complaint of chest pain.  Patient states that her chest pain is located on the right side and radiates to the scapular region.  Pain is constant for the last 2 days and it is worse with cough.  Patient's cough is producing clear phlegm.  Patient is not particularly having shortness of breath.  She states that her pain is also worse when she is sitting up and upon ambulation.  Patient denies any associated nausea, diaphoresis and she does not have history of similar pain.  Pt has no hx of PE, DVT and denies any exogenous hormone (testosterone / estrogen) use, long distance travels or surgery in the past 6 weeks, active cancer, recent immobilization.  Patient states that she has family history of lupus and CAD.  Patient does not smoke or use any drugs.   Past Medical History:  Diagnosis Date  . Allergy   . Back pain    Seen by Neurosurery  . Hyperlipidemia   . Hypertension   . Leg pain    Bil  . Obesity   . Peripheral neuropathy   . Post-menopausal   . Sleep apnea    no cpap    Patient Active Problem List   Diagnosis Date Noted  . Right upper quadrant abdominal tenderness 07/08/2017  . Back pain 01/06/2017  . 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 Routine adult health maintenance 07/20/2014  . Bilateral edema of lower extremity 09/29/2013  . Chest pain, atypical 08/28/2012  . Headache 08/05/2012  . Obstructive sleep apnea 12/26/2011  . Fatigue 09/13/2011  . Prediabetes 07/18/2011  . Morbid obesity ([place]) 04/16/2010  . 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 GAIT DISTURBANCE 05/03/2009  . Allergic  rhinitis 09/15/2008  . MENOPAUSAL SYNDROME 09/05/2008  . Hyperlipemia 06/12/2007  . HYPERTENSION, BENIGN SYSTEMIC 07/17/2006    Past Surgical History:  Procedure Laterality Date  . carpel tunnel release Bilateral   . COLONOSCOPY    . LAMINECTOMY  01/20/2008   L4-L5 by Dr Latanya Maudlin (Ortho), [facility]  . POLYPECTOMY    . TUBAL LIGATION       OB History   None      Home Medications    Prior to Admission medications   Medication Sig Start Date End Date Taking? Authorizing Provider  albuterol (PROVENTIL HFA;VENTOLIN HFA) 108 (90 Base) MCG/ACT inhaler Inhale 1-2 puffs into the lungs every 6 (six) hours as needed for wheezing or shortness of breath. 04/03/16  Yes Archie Patten, MD  carvedilol (COREG) 25 MG tablet Take 1 tablet (25 mg total) by mouth 2 (two) times daily with a meal. 01/06/17  Yes Mikell, Jeani Sow, MD  DULoxetine (CYMBALTA) 30 MG capsule Take 1 capsule (30 mg total) by mouth daily. 10/25/16  Yes Archie Patten, MD  fluticasone (FLONASE) 50 MCG/ACT nasal spray Place 2 sprays into both nostrils daily. Patient taking differently: Place 2 sprays into both nostrils daily as needed for allergies.  02/21/15  Yes Archie Patten, MD  gabapentin (NEURONTIN) 300 MG capsule Take 600mg  every afternoon and 900mg  qhs Patient taking differently: Take 600-900 mg by mouth  2 (two) times daily. Take 600mg  every afternoon and 900mg  qhs 05/30/16  Yes Bacigalupo, Dionne Bucy, MD  losartan (COZAAR) 50 MG tablet Take 1 tablet (50 mg total) by mouth 2 (two) times daily. 04/29/17  Yes Mikell, Jeani Sow, MD  meloxicam (MOBIC) 7.5 MG tablet Take 1 tablet (7.5 mg total) by mouth daily. Patient taking differently: Take 7.5 mg by mouth daily as needed for pain.  08/11/17  Yes Verner Mould, MD  aspirin 81 MG chewable tablet Chew 1 tablet (81 mg total) by mouth daily. Patient not taking: Reported on 09/06/2017 09/07/15   Leo Grosser, MD  atorvastatin (LIPITOR) 40 MG  tablet Take 1 tablet (40 mg total) by mouth daily. Patient not taking: Reported on 09/06/2017 07/08/17   Tonette Bihari, MD  cyclobenzaprine (FLEXERIL) 10 MG tablet Take 1/2 to 1 whole tablet by mouth every 8 hours as needed for muscle pain/spasms. Patient not taking: Reported on 09/06/2017 06/16/17   Katy Apo, NP  naproxen (NAPROSYN) 500 MG tablet Take 1 tablet (500 mg total) by mouth 2 (two) times daily with a meal. 09/06/17   Varney Biles, MD  traZODone (DESYREL) 50 MG tablet Take 0.5-1 tablets (25-50 mg total) by mouth at bedtime as needed for sleep. Patient not taking: Reported on 09/06/2017 06/05/17   Tonette Bihari, MD    Family History Family History  Problem Relation Age of Onset  . Lupus Mother   . Heart disease Mother   . Anemia Father   . Hypertension Father   . Arthritis Brother   . Asthma Son   . Lupus Daughter   . Colon cancer Neg Hx   . Colon polyps Neg Hx   . Esophageal cancer Neg Hx   . Rectal cancer Neg Hx   . Stomach cancer Neg Hx   . Breast cancer Neg Hx     Social History Social History   Tobacco Use  . Smoking status: Former Smoker    Packs/day: 0.25    Years: 0.50    Pack years: 0.12    Types: Cigarettes    Last attempt to quit: 12/19/1990    Years since quitting: 26.7  . Smokeless tobacco: Never Used  . Tobacco comment: Quit 25 years ago.  Substance Use Topics  . Alcohol use: No    Alcohol/week: 0.0 oz    Comment: 05/29/16 none  . Drug use: Yes    Frequency: 4.0 times per week    Types: Marijuana    Comment: Helps with back pain, 05/29/16 three x day     Allergies   Amoxicillin and Penicillins   Review of Systems Review of Systems  Constitutional: Positive for activity change.  Respiratory: Positive for cough and chest tightness. Negative for shortness of breath and wheezing.   Cardiovascular: Positive for chest pain.  Gastrointestinal: Negative for nausea and vomiting.  Neurological: Negative for light-headedness.      Physical Exam Updated Vital Signs BP (!) 167/112   Pulse 70   Temp 98.4 F (36.9 C) (Oral)   Resp 14   Ht 5\' 3"  (1.6 m)   Wt 87.5 kg (193 lb)   SpO2 96%   BMI 34.19 kg/m   Physical Exam  Constitutional: She is oriented to person, place, and time. She appears well-developed.  HENT:  Head: Normocephalic and atraumatic.  Eyes: EOM are normal.  Neck: Normal range of motion. Neck supple.  Cardiovascular: Normal rate, intact distal pulses and normal pulses.  Pulmonary/Chest: Effort normal  and breath sounds normal. No stridor. No tachypnea. She has no decreased breath sounds. She has no wheezes. She has no rhonchi. She has no rales.  Abdominal: Bowel sounds are normal.  Musculoskeletal:       Right lower leg: She exhibits no tenderness and no edema.       Left lower leg: She exhibits no tenderness and no edema.  Neurological: She is alert and oriented to person, place, and time.  Skin: Skin is warm and dry. Capillary refill takes less than 2 seconds.  Nursing note and vitals reviewed.    ED Treatments / Results  Labs (all labs ordered are listed, but only abnormal results are displayed) Labs Reviewed  BASIC METABOLIC PANEL - Abnormal; Notable for the following components:      Result Value   Glucose, Bld 108 (*)    All other components within normal limits  CBC - Abnormal; Notable for the following components:   RBC 5.12 (*)    All other components within normal limits  D-DIMER, QUANTITATIVE (NOT AT [facility])  I-STAT TROPONIN, ED  I-STAT TROPONIN, ED    EKG EKG Interpretation  Date/Time:  Friday September 05 2017 21:36:31 EDT Ventricular Rate:  72 PR Interval:    QRS Duration: 86 QT Interval:  391 QTC Calculation: 428 R Axis:   39 Text Interpretation:  Sinus rhythm Probable anteroseptal infarct, old Baseline wander in lead(s) V1 V2 V3 V5 V6 No significant change since last tracing Confirmed by Dorie Rank [phone]) on 09/05/2017 9:48:34 PM   Radiology Dg Chest 2  View  Result Date: 09/05/2017 CLINICAL DATA:  Chest pain and dry cough for 2 days EXAM: CHEST - 2 VIEW COMPARISON:  11/30/2016 FINDINGS: The heart size and mediastinal contours are within normal limits. Both lungs are clear. The visualized skeletal structures are unremarkable. IMPRESSION: No active cardiopulmonary disease. Electronically Signed   By: Inez Catalina M.D.   On: 09/05/2017 21:57    Procedures Procedures (including critical care time)  Medications Ordered in ED Medications  aspirin chewable tablet 324 mg (324 mg Oral Given 09/06/17 0152)     Initial Impression / Assessment and Plan / ED Course  I have reviewed the triage vital signs and the nursing notes.  Pertinent labs & imaging results that were available during my care of the patient were reviewed by me and considered in my medical decision making (see chart for details).  Clinical Course as of Sep 07 402  Sat Sep 06, 2017  0403 Results from the ER workup discussed with the patient face to face and all questions answered to the best of my ability.  D-dimer came back lower than the threshold.  I discussed the findings with the patient.  She was also informed that delta troponin is normal.  Patient has been made aware that women can have atypical symptoms with ACS, therefore she has been advised to come to the ER immediately for chest pain gets worse or she starts having diaphoresis or left-sided pain.  She also has been made aware that d-dimer is not 100% sensitive, therefore if she starts having worsening of her current pain she needs to come to the ER for further evaluation.  I do think that the underlying etiology could be pleurisy -and so we will give patient NSAIDs for her symptoms.  Patient is comfortable with the plan.  D-Dimer, Quant: 0.44 [AN]    Clinical Course User Index [AN] Varney Biles, MD    57 year old female comes in  with chief complaint of right-sided chest pain.  Chest pain is midsternal and  radiating to the scapular region.  Patient has cardiac risk factors, but she does not have known history of CAD and she has no history of similar symptoms in the past.  Chest pain is pleuritic in nature and there is no risk factors for PE either.  Differential diagnosis includes ACS, COPD, PE, Pneumonia.  ACS is considered in the differential diagnosis mainly because of the risk factors.  Patient's HEART score is 4 - but again, since the pain is constant, pleuritic and R sided - my pre-test probability for cardiac etiology is low. EKG is unchanged and trop x 2 ordered.  Dimer ordered due to pain with deep inspiration and cough. CXR is clear.   Final Clinical Impressions(s) / ED Diagnoses   Final diagnoses:  Pleurisy    ED Discharge Orders        Ordered    naproxen (NAPROSYN) 500 MG tablet  2 times daily with meals     09/06/17 [place], Karine Garn, MD 09/06/17 7014

## 2017-09-17 ENCOUNTER — Ambulatory Visit (HOSPITAL_COMMUNITY)
Admission: EM | Admit: 2017-09-17 | Discharge: 2017-09-17 | Disposition: A | Discharge status: Home / Self Care | Payer: Medicare HMO | Attending: Family Medicine | Admitting: Family Medicine

## 2017-09-17 ENCOUNTER — Encounter (HOSPITAL_COMMUNITY): Payer: Self-pay | Admitting: Emergency Medicine

## 2017-09-17 ENCOUNTER — Ambulatory Visit (INDEPENDENT_AMBULATORY_CARE_PROVIDER_SITE_OTHER): Payer: Medicare HMO

## 2017-09-17 DIAGNOSIS — R05 Cough: Secondary | ICD-10-CM

## 2017-09-17 DIAGNOSIS — R0789 Other chest pain: Secondary | ICD-10-CM | POA: Diagnosis not present

## 2017-09-17 DIAGNOSIS — R059 Cough, unspecified: Secondary | ICD-10-CM

## 2017-09-17 DIAGNOSIS — R079 Chest pain, unspecified: Secondary | ICD-10-CM | POA: Diagnosis not present

## 2017-09-17 MED ORDER — FLUTICASONE PROPIONATE 50 MCG/ACT NA SUSP: 1.0000 | Freq: Every day | NASAL | 0 refills | Status: DC

## 2017-09-17 MED ORDER — PREDNISONE 50 MG PO TABS: 50.0000 mg | ORAL_TABLET | Freq: Every day | ORAL | 0 refills | Status: AC

## 2017-09-17 MED ORDER — KETOROLAC TROMETHAMINE 60 MG/2ML IM SOLN
INTRAMUSCULAR | Status: AC
  Filled 2017-09-17: qty 2

## 2017-09-17 MED ORDER — CETIRIZINE HCL 10 MG PO CAPS: 10.0000 mg | ORAL_CAPSULE | Freq: Every day | ORAL | 0 refills | Status: DC

## 2017-09-17 MED ORDER — BENZONATATE 200 MG PO CAPS: 200.0000 mg | ORAL_CAPSULE | Freq: Three times a day (TID) | ORAL | 0 refills | Status: AC | PRN

## 2017-09-17 MED ORDER — IBUPROFEN 800 MG PO TABS: 800.0000 mg | ORAL_TABLET | Freq: Three times a day (TID) | ORAL | 0 refills | Status: DC

## 2017-09-17 MED ORDER — KETOROLAC TROMETHAMINE 60 MG/2ML IM SOLN
60.0000 mg | Freq: Once | INTRAMUSCULAR | Status: AC
  Administered 2017-09-17: 60 mg via INTRAMUSCULAR

## 2017-09-17 NOTE — Discharge Instructions (Signed)
Chest xray and EKG normal  Please use Tessalon as needed for cough.  Please take ibuprofen as needed for chest discomfort.  If you have worsening chest discomfort, difficulty breathing or shortness of breath please go to emergency room.

## 2017-09-17 NOTE — ED Triage Notes (Signed)
Pt sts cough and pain with cough x 1 week

## 2017-09-17 NOTE — ED Provider Notes (Signed)
[facility]    CSN: [id number] Arrival date & time: 09/17/17  1615     History   Chief Complaint Chief Complaint  Patient presents with  . Cough    HPI Kristen [name] is a 57 y.o. female history of hypertension, hyperlipidemia, OSA presenting today for evaluation of cough and chest discomfort.  Patient has had chest discomfort to her left chest that wraps around to her back.  Symptoms have been going on for approximately 5 to 7 days.  Patient was seen in emergency room on April 20 for similar symptoms and was sent home with Naprosyn to treat musculoskeletal cause of discomfort.  Since then patient has had worsening symptoms.  He is not taking anything for cough.  Patient is also having some congestion and rhinorrhea.  Tolerating oral intake normally, no nausea, vomiting, abdominal pain or diarrhea.  Denies fevers.  HPI  Past Medical History:  Diagnosis Date  . Allergy   . Back pain    Seen by Neurosurery  . Hyperlipidemia   . Hypertension   . Leg pain    Bil  . Obesity   . Peripheral neuropathy   . Post-menopausal   . Sleep apnea    no cpap    Patient Active Problem List   Diagnosis Date Noted  . Right upper quadrant abdominal tenderness 07/08/2017  . Back pain 01/06/2017  . 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 Routine adult health maintenance 07/20/2014  . Bilateral edema of lower extremity 09/29/2013  . Chest pain, atypical 08/28/2012  . Headache 08/05/2012  . Obstructive sleep apnea 12/26/2011  . Fatigue 09/13/2011  . Prediabetes 07/18/2011  . Morbid obesity ([place]) 04/16/2010  . 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 GAIT DISTURBANCE 05/03/2009  . Allergic rhinitis 09/15/2008  . MENOPAUSAL SYNDROME 09/05/2008  . Hyperlipemia 06/12/2007  . HYPERTENSION, BENIGN SYSTEMIC 07/17/2006    Past Surgical History:  Procedure Laterality Date  . carpel tunnel release Bilateral   . COLONOSCOPY    . LAMINECTOMY  01/20/2008   L4-L5 by Dr  Latanya Maudlin (Ortho), [facility]  . POLYPECTOMY    . TUBAL LIGATION      OB History   None      Home Medications    Prior to Admission medications   Medication Sig Start Date End Date Taking? Authorizing Provider  albuterol (PROVENTIL HFA;VENTOLIN HFA) 108 (90 Base) MCG/ACT inhaler Inhale 1-2 puffs into the lungs every 6 (six) hours as needed for wheezing or shortness of breath. 04/03/16   Archie Patten, MD  benzonatate (TESSALON) 200 MG capsule Take 1 capsule (200 mg total) by mouth 3 (three) times daily as needed for up to 7 days for cough. 09/17/17 09/24/17  Wieters, Hallie C, PA-C  carvedilol (COREG) 25 MG tablet Take 1 tablet (25 mg total) by mouth 2 (two) times daily with a meal. 01/06/17   Mikell, Jeani Sow, MD  Cetirizine HCl 10 MG CAPS Take 1 capsule (10 mg total) by mouth daily for 15 days. 09/17/17 10/02/17  Wieters, Hallie C, PA-C  DULoxetine (CYMBALTA) 30 MG capsule Take 1 capsule (30 mg total) by mouth daily. 10/25/16   Archie Patten, MD  fluticasone (FLONASE) 50 MCG/ACT nasal spray Place 1-2 sprays into both nostrils daily for 7 days. 09/17/17 09/24/17  Wieters, Hallie C, PA-C  gabapentin (NEURONTIN) 300 MG capsule Take 600mg  every afternoon and 900mg  qhs Patient taking differently: Take 600-900 mg by mouth 2 (two) times daily. Take 600mg  every afternoon and 900mg   qhs 05/30/16   Bacigalupo, Dionne Bucy, MD  ibuprofen (ADVIL,MOTRIN) 800 MG tablet Take 1 tablet (800 mg total) by mouth 3 (three) times daily. 09/17/17   Wieters, Hallie C, PA-C  losartan (COZAAR) 50 MG tablet Take 1 tablet (50 mg total) by mouth 2 (two) times daily. 04/29/17   Mikell, Jeani Sow, MD  meloxicam (MOBIC) 7.5 MG tablet Take 1 tablet (7.5 mg total) by mouth daily. Patient taking differently: Take 7.5 mg by mouth daily as needed for pain.  08/11/17   Verner Mould, MD  naproxen (NAPROSYN) 500 MG tablet Take 1 tablet (500 mg total) by mouth 2 (two) times daily with a meal. 09/06/17    Varney Biles, MD  predniSONE (DELTASONE) 50 MG tablet Take 1 tablet (50 mg total) by mouth daily for 5 days. 09/17/17 09/22/17  Wieters, Elesa Hacker, PA-C    Family History Family History  Problem Relation Age of Onset  . Lupus Mother   . Heart disease Mother   . Anemia Father   . Hypertension Father   . Arthritis Brother   . Asthma Son   . Lupus Daughter   . Colon cancer Neg Hx   . Colon polyps Neg Hx   . Esophageal cancer Neg Hx   . Rectal cancer Neg Hx   . Stomach cancer Neg Hx   . Breast cancer Neg Hx     Social History Social History   Tobacco Use  . Smoking status: Former Smoker    Packs/day: 0.25    Years: 0.50    Pack years: 0.12    Types: Cigarettes    Last attempt to quit: 12/19/1990    Years since quitting: 26.7  . Smokeless tobacco: Never Used  . Tobacco comment: Quit 25 years ago.  Substance Use Topics  . Alcohol use: No    Alcohol/week: 0.0 oz    Comment: 05/29/16 none  . Drug use: Yes    Frequency: 4.0 times per week    Types: Marijuana    Comment: Helps with back pain, 05/29/16 three x day     Allergies   Amoxicillin and Penicillins   Review of Systems Review of Systems  Constitutional: Negative for chills, fatigue and fever.  HENT: Positive for congestion and rhinorrhea. Negative for ear pain, sinus pressure, sore throat and trouble swallowing.   Respiratory: Positive for cough and chest tightness. Negative for shortness of breath.   Cardiovascular: Positive for chest pain.  Gastrointestinal: Negative for abdominal pain, nausea and vomiting.  Musculoskeletal: Negative for myalgias.  Skin: Negative for rash.  Neurological: Negative for dizziness, light-headedness and headaches.     Physical Exam Triage Vital Signs ED Triage Vitals [09/17/17 1631]  Enc Vitals Group     BP (!) 186/100     Pulse Rate 73     Resp 18     Temp 98.3 F (36.8 C)     Temp Source Oral     SpO2 98 %     Weight      Height      Head Circumference      Peak Flow       Pain Score      Pain Loc      Pain Edu?      Excl. in [place]?    No data found.  Updated Vital Signs BP (!) 186/100 (BP Location: Left Arm)   Pulse 73   Temp 98.3 F (36.8 C) (Oral)   Resp 18   SpO2  98%   Visual Acuity Right Eye Distance:   Left Eye Distance:   Bilateral Distance:    Right Eye Near:   Left Eye Near:    Bilateral Near:     Physical Exam  Constitutional: She appears well-developed and well-nourished. No distress.  Patient sitting on exam table, tearful due to pain  HENT:  Head: Normocephalic and atraumatic.  Mouth/Throat: Oropharynx is clear and moist.  Bilateral TMs nonerythematous, nasal mucosa erythematous, no rhinorrhea present, posterior oropharynx nonerythematous, no tonsillar enlargement or exudate.  Eyes: Conjunctivae are normal.  Neck: Neck supple.  Cardiovascular: Normal rate and regular rhythm.  No murmur heard. Pulmonary/Chest: Effort normal and breath sounds normal. No respiratory distress.  Unable to take deep respirations due to pain.  Lungs sound clear with short respirations.  Abdominal: Soft. There is no tenderness.  Musculoskeletal: She exhibits no edema.  Neurological: She is alert.  Skin: Skin is warm and dry.  Psychiatric: She has a normal mood and affect.  Nursing note and vitals reviewed.    UC Treatments / Results  Labs (all labs ordered are listed, but only abnormal results are displayed) Labs Reviewed - No data to display  EKG None  Radiology Dg Chest 2 View  Result Date: 09/17/2017 CLINICAL DATA:  57 y/o  F; left-sided chest pain for 2 weeks. EXAM: CHEST - 2 VIEW COMPARISON:  09/05/2017 chest radiograph. FINDINGS: Stable heart size and mediastinal contours are within normal limits. Both lungs are clear. The visualized skeletal structures are unremarkable. IMPRESSION: No active cardiopulmonary disease. Electronically Signed   By: Kristine Garbe M.D.   On: 09/17/2017 17:35    Procedures Procedures  (including critical care time)  Medications Ordered in UC Medications  ketorolac (TORADOL) injection 60 mg (60 mg Intramuscular Given 09/17/17 1727)    Initial Impression / Assessment and Plan / UC Course  I have reviewed the triage vital signs and the nursing notes.  Pertinent labs & imaging results that were available during my care of the patient were reviewed by me and considered in my medical decision making (see chart for details).     Chest x-ray negative, EKG normal sinus rhythm without signs of ischemia or infarction.  Chest discomfort likely musculoskeletal related to cough.  Discussed with patient also possible PE, although this time she does not appear to have any signs or symptoms and low risk factors.  Advised patient if she has worsening chest discomfort, difficulty breathing or shortness of breath to go to emergency room.  At this time she appears stable.  Toradol injection provided in clinic and pain did improve.  Will provide prednisone and Tessalon for cough.  Zyrtec and Flonase for congestion. Discussed strict return precautions. Patient verbalized understanding and is agreeable with plan.  Final Clinical Impressions(s) / UC Diagnoses   Final diagnoses:  Cough  Chest wall pain     Discharge Instructions     Chest xray and EKG normal  Please use Tessalon as needed for cough.  Please take ibuprofen as needed for chest discomfort.  If you have worsening chest discomfort, difficulty breathing or shortness of breath please go to emergency room.      ED Prescriptions    Medication Sig Dispense Auth. Provider   benzonatate (TESSALON) 200 MG capsule Take 1 capsule (200 mg total) by mouth 3 (three) times daily as needed for up to 7 days for cough. 28 capsule Wieters, Hallie C, PA-C   ibuprofen (ADVIL,MOTRIN) 800 MG tablet Take 1 tablet (800  mg total) by mouth 3 (three) times daily. 30 tablet Wieters, Hallie C, PA-C   Cetirizine HCl 10 MG CAPS Take 1 capsule (10 mg  total) by mouth daily for 15 days. 15 capsule Wieters, Hallie C, PA-C   fluticasone (FLONASE) 50 MCG/ACT nasal spray Place 1-2 sprays into both nostrils daily for 7 days. 1 g Wieters, Hallie C, PA-C   predniSONE (DELTASONE) 50 MG tablet Take 1 tablet (50 mg total) by mouth daily for 5 days. 5 tablet Wieters, Hallie C, PA-C     Controlled Substance Prescriptions [place] Controlled Substance Registry consulted? Not Applicable   Janith Lima, [place] 09/17/17 2120

## 2017-09-18 ENCOUNTER — Ambulatory Visit (INDEPENDENT_AMBULATORY_CARE_PROVIDER_SITE_OTHER): Payer: Medicare HMO | Admitting: Internal Medicine

## 2017-09-18 ENCOUNTER — Encounter: Payer: Self-pay | Admitting: Internal Medicine

## 2017-09-18 VITALS — BP 121/80 | HR 82 | Temp 98.5°F | Ht 63.0 in | Wt 189.6 lb

## 2017-09-18 DIAGNOSIS — M62838 Other muscle spasm: Secondary | ICD-10-CM | POA: Diagnosis not present

## 2017-09-18 DIAGNOSIS — G8929 Other chronic pain: Secondary | ICD-10-CM | POA: Diagnosis not present

## 2017-09-18 DIAGNOSIS — M79604 Pain in right leg: Secondary | ICD-10-CM

## 2017-09-18 DIAGNOSIS — M62830 Muscle spasm of back: Secondary | ICD-10-CM | POA: Insufficient documentation

## 2017-09-18 DIAGNOSIS — M79605 Pain in left leg: Secondary | ICD-10-CM

## 2017-09-18 DIAGNOSIS — M5440 Lumbago with sciatica, unspecified side: Secondary | ICD-10-CM

## 2017-09-18 MED ORDER — METHOCARBAMOL 500 MG PO TABS: 500.0000 mg | ORAL_TABLET | Freq: Four times a day (QID) | ORAL | 0 refills | Status: DC

## 2017-09-18 MED ORDER — GABAPENTIN 300 MG PO CAPS: 600.0000 mg | ORAL_CAPSULE | Freq: Two times a day (BID) | ORAL | 2 refills | Status: DC

## 2017-09-18 NOTE — Progress Notes (Signed)
   [facility] Kerrin Mo, MD Phone: [phone]  Reason For Visit: SDA for Back Pain    #Patient presents with left back pain the radiate to the front. Had several days of cough with clear phelgm and then developed the back pain. Patient states that is worse with breathing in and out. No fever or chills. Cough is resolving . No Hemyophysis. Not worse with activity, patient was seen previously in the urgent care in ED for this issue.  She had an EKG done.  She had a chest x-ray done.  She had a d-dimer done and delta troponins all of which have been negative.  Past Medical History Reviewed problem list.  Medications- reviewed and updated No additions to family history  Objective: BP 121/80 (BP Location: Left Arm, Patient Position: Sitting, Cuff Size: Normal)   Pulse 82   Temp 98.5 F (36.9 C) (Oral)   Ht 5\' 3"  (1.6 m)   Wt 189 lb 9.6 oz (86 kg)   SpO2 98%   BMI 33.59 kg/m  Gen: NAD, alert, cooperative with exam Cardio: regular rate and rhythm, S1S2 heard, no murmurs appreciated Pulm: clear to auscultation bilaterally, no wheezes, rhonchi or rales MSK: no abnormalites noted on inspection, pain radiates from the paraspinal muscles towards the the left sided,  Skin: dry, intact, no rashes or lesions   Assessment/Plan: See problem based a/p  Muscle spasm Muscle spasm in the paraspinal thoracic muscles-improvement of pain with massage of the above area on palpation.  Patient has had a significant work-up for pleuritis given history of pain associated with inspiration in the urgent care and ED. chest pain rule out was done, normal EKG, chest x-ray was normal, d-dimer was negative, troponins were negative - Continue previously provided prednisone  - Provide Robaxin  - Follow up in 3 weeks as needed   Back pain Hx of years of back pain  Would like evaluation by plastic surgery for breast reduction

## 2017-09-18 NOTE — Patient Instructions (Signed)
I want you to follow up in about 3 weeks if no improvement. I am going to give you the Robaxin to help with muscle spasm. I would recommend restarting water aerobics.

## 2017-09-18 NOTE — Assessment & Plan Note (Signed)
Hx of years of back pain  Would like evaluation by plastic surgery for breast reduction

## 2017-09-18 NOTE — Assessment & Plan Note (Signed)
Muscle spasm in the paraspinal thoracic muscles-improvement of pain with massage of the above area on palpation.  Patient has had a significant work-up for pleuritis given history of pain associated with inspiration in the urgent care and ED. chest pain rule out was done, normal EKG, chest x-ray was normal, d-dimer was negative, troponins were negative - Continue previously provided prednisone  - Provide Robaxin  - Follow up in 3 weeks as needed

## 2017-10-03 ENCOUNTER — Other Ambulatory Visit: Payer: Self-pay

## 2017-10-03 DIAGNOSIS — M62838 Other muscle spasm: Secondary | ICD-10-CM

## 2017-10-03 MED ORDER — METHOCARBAMOL 500 MG PO TABS: 500.0000 mg | ORAL_TABLET | Freq: Four times a day (QID) | ORAL | 0 refills | Status: DC

## 2017-10-03 NOTE — Telephone Encounter (Signed)
Pt called nurse line complaining of back pain and requesting refill of muscle relaxer. Call back number [phone] Wallace Cullens, RN

## 2017-10-14 DIAGNOSIS — N62 Hypertrophy of breast: Secondary | ICD-10-CM | POA: Insufficient documentation

## 2017-10-14 DIAGNOSIS — M542 Cervicalgia: Secondary | ICD-10-CM | POA: Diagnosis not present

## 2017-10-14 DIAGNOSIS — G8929 Other chronic pain: Secondary | ICD-10-CM | POA: Diagnosis not present

## 2017-10-14 DIAGNOSIS — M546 Pain in thoracic spine: Secondary | ICD-10-CM | POA: Diagnosis not present

## 2017-11-03 ENCOUNTER — Ambulatory Visit: Payer: Medicare HMO | Admitting: Internal Medicine

## 2017-11-05 ENCOUNTER — Ambulatory Visit (INDEPENDENT_AMBULATORY_CARE_PROVIDER_SITE_OTHER): Payer: Medicare HMO | Admitting: Internal Medicine

## 2017-11-05 ENCOUNTER — Encounter: Payer: Self-pay | Admitting: Internal Medicine

## 2017-11-05 VITALS — BP 124/80 | HR 79 | Temp 98.2°F | Wt 187.4 lb

## 2017-11-05 DIAGNOSIS — M62838 Other muscle spasm: Secondary | ICD-10-CM

## 2017-11-05 DIAGNOSIS — M25561 Pain in right knee: Secondary | ICD-10-CM | POA: Diagnosis not present

## 2017-11-05 DIAGNOSIS — G8929 Other chronic pain: Secondary | ICD-10-CM

## 2017-11-05 MED ORDER — METHOCARBAMOL 500 MG PO TABS: 500.0000 mg | ORAL_TABLET | Freq: Four times a day (QID) | ORAL | 0 refills | Status: DC

## 2017-11-05 NOTE — Patient Instructions (Signed)
I am going to provide with another month of muscle relaxer. If no improvement after a month we should obtain xrays of your knee again.

## 2017-11-05 NOTE — Assessment & Plan Note (Addendum)
Improved with muscle relaxer - Will refill  - methocarbamol (ROBAXIN) 500 MG tablet; Take 1 tablet (500 mg total) by mouth 4 (four) times daily.  Dispense: 30 tablet; Refill: 0 - If no improvement in 1 month, consider xrays. - last obtained in 2015 were normal -Follow up as needed

## 2017-11-05 NOTE — Progress Notes (Signed)
   [facility] Kerrin Mo, MD Phone: [phone]  Reason For Visit: SDA for Right Knee Pain   # Right knee exacerbation  - Patient states she has been having right  knee pain. Patient has had it for several months. Patient feels like it is exacerbation. Patient has been trying to do stretches - Has been taking gabapentin, patient states the muscle relaxer she had helps with the pain - Had xray of the knee in 2015    Past Medical History Reviewed problem list.  Medications- reviewed and updated No additions to family history Social history- patient is a non- smoker  Objective: BP 124/80   Pulse 79   Temp 98.2 F (36.8 C) (Oral)   Wt 187 lb 6.4 oz (85 kg)   SpO2 97%   BMI 33.20 kg/m  Gen: NAD, alert, cooperative with exam Extremities: No abnormalities noted on inspection of right knee, normal range of motion, no tenderness elicited during exam, 5 out of 5 strength, neurovascularly intact Skin: No rashes, swelling,  Assessment/Plan: See problem based a/p  Chronic pain of right knee Improved with muscle relaxer - Will refill  - methocarbamol (ROBAXIN) 500 MG tablet; Take 1 tablet (500 mg total) by mouth 4 (four) times daily.  Dispense: 30 tablet; Refill: 0 - If no improvement in 1 month, consider xrays. - last obtained in 2015 were normal -Follow up as needed

## 2017-11-26 ENCOUNTER — Telehealth: Payer: Self-pay

## 2017-11-26 NOTE — Telephone Encounter (Signed)
Hi Alisa, Thanks for letting me know. I called the patient back and she said that "they took care of it already".

## 2017-11-26 NOTE — Telephone Encounter (Signed)
Patient called and stated handicap parking form that Dr Emmaline Life filled out did not have either temporary or permament circled and DM[employer] would not accept.  Can she bring it by and have that part filled out or does she need a new one?  Call back is [phone]  Danley Danker, RN [facility] [place])

## 2017-12-10 DIAGNOSIS — M546 Pain in thoracic spine: Secondary | ICD-10-CM | POA: Diagnosis not present

## 2017-12-10 DIAGNOSIS — G8929 Other chronic pain: Secondary | ICD-10-CM | POA: Diagnosis not present

## 2017-12-10 DIAGNOSIS — M542 Cervicalgia: Secondary | ICD-10-CM | POA: Diagnosis not present

## 2017-12-10 DIAGNOSIS — N62 Hypertrophy of breast: Secondary | ICD-10-CM | POA: Diagnosis not present

## 2017-12-15 ENCOUNTER — Other Ambulatory Visit: Payer: Self-pay

## 2017-12-15 ENCOUNTER — Ambulatory Visit (INDEPENDENT_AMBULATORY_CARE_PROVIDER_SITE_OTHER): Payer: Medicare HMO | Admitting: Family Medicine

## 2017-12-15 VITALS — BP 110/72 | HR 82 | Temp 98.4°F | Wt 183.0 lb

## 2017-12-15 DIAGNOSIS — S46811A Strain of other muscles, fascia and tendons at shoulder and upper arm level, right arm, initial encounter: Secondary | ICD-10-CM

## 2017-12-15 MED ORDER — METHOCARBAMOL 500 MG PO TABS: 500.0000 mg | ORAL_TABLET | Freq: Four times a day (QID) | ORAL | 0 refills | Status: DC

## 2017-12-15 NOTE — Assessment & Plan Note (Addendum)
Acute on chronic.  No signs of radiculopathy.  Consistent with trapezius strain. - Given short-term prescription for Robaxin 500 mg 4 times daily as needed - Discussed alternative therapies including warm compress and massage - Advised patient to follow-up with PCP for long-term plan if symptoms persist

## 2017-12-15 NOTE — Patient Instructions (Signed)
Thank you for coming in to see [place] today. Please see below to review our plan for today's visit.  Your symptoms are consistent with tension strain from your right trapezius muscle.  I have given you a muscle relaxer that you can take on an as-needed basis.  Follow-up with your PCP to discuss other options if you continue having recurrence of this issue.  Medication I sent and is called Robaxin which you can use up to 4 times daily.  These muscle relaxers can cause sedation so do not operate heavy machinery or drive while using this medication.  You can also use warm compress to the area and massage to alleviate some of the tension.  Please call the clinic at [phone] if your symptoms worsen or you have any concerns. It was our pleasure to serve you.  Harriet Butte, [place], PGY-3

## 2017-12-15 NOTE — Progress Notes (Signed)
   Subjective   Patient ID: Kristen [name]    DOB: 22-Mar-1961, 56 y.o. female   MRN: [id number]  CC: "Back spasms"  HPI: Kristen [name] is a 57 y.o. female who presents to clinic today for the following:  NECK PAIN  Onset: 1 day Pain characteristic: intermittent 10/10 "bam twist in neck" Patient has tried: gabapentin w/o improvement Pain radiates: no History of trauma or injury: no Prior history of similar pain: yes, since back surgery 4 years ago History of cancer: no Weak immune system: no History of IV drug use: no History of steroid use: no  Symptoms Incontinence of bowel or bladder: no Saddle anesthesia: no Radiculopathy: no Fever: no Rest or nocturnal pain: no Weight Loss: no Rash: no  ROS: see HPI for pertinent.  [place]: HTN, obesity, OSA, prediabetes, HLD, chronic back pain, peripheral neuropathy.  Surgical history tubal, polypectomy, laminectomy L4-L5 (2009), carpal tunnel bilaterally.  Family history lupus, heart disease, insomnia.  Smoking status reviewed. Medications reviewed.  Objective   BP 110/72   Pulse 82   Temp 98.4 F (36.9 C) (Oral)   Wt 183 lb (83 kg)   SpO2 95%   BMI 32.42 kg/m  Vitals and nursing note reviewed.  General: well nourished, well developed, NAD with non-toxic appearance HEENT: normocephalic, atraumatic, moist mucous membranes Neck: supple, without lymphadenopathy, patient able to touch chin to chest, no central spinal tenderness, increased muscle tone to right trapezius with mild tenderness to palpation Cardiovascular: regular rate and rhythm without murmurs, rubs, or gallops Lungs: clear to auscultation bilaterally with normal work of breathing Skin: warm, dry, no rashes or lesions, cap refill < 2 seconds Extremities: warm and well perfused, no edema, range of motion intact in upper extremities bilaterally, 5/5 motor strength on upper extremity bilaterally, neurovascular intact  Assessment & Plan   Trapezius strain,  right, initial encounter Acute on chronic.  No signs of radiculopathy.  Consistent with trapezius strain. - Given short-term prescription for Robaxin 500 mg 4 times daily as needed - Discussed alternative therapies including warm compress and massage - Advised patient to follow-up with PCP for long-term plan if symptoms persist  No orders of the defined types were placed in this encounter.  Meds ordered this encounter  Medications  . methocarbamol (ROBAXIN) 500 MG tablet    Sig: Take 1 tablet (500 mg total) by mouth 4 (four) times daily.    Dispense:  30 tablet    Refill:  0    Harriet Butte, DO [place], PGY-3 12/15/2017, 12:30 PM

## 2018-01-06 ENCOUNTER — Other Ambulatory Visit: Payer: Self-pay | Admitting: Internal Medicine

## 2018-01-06 DIAGNOSIS — I10 Essential (primary) hypertension: Secondary | ICD-10-CM

## 2018-01-18 HISTORY — PX: REDUCTION MAMMAPLASTY: SUR839

## 2018-02-09 ENCOUNTER — Other Ambulatory Visit: Payer: Self-pay

## 2018-02-09 ENCOUNTER — Encounter (HOSPITAL_BASED_OUTPATIENT_CLINIC_OR_DEPARTMENT_OTHER): Payer: Self-pay | Admitting: *Deleted

## 2018-02-10 NOTE — Progress Notes (Signed)
Ensure pre surgery drink given with instructions to complete by 0415 dos, surgical soap given with instructions, pt verbalized understanding. 

## 2018-02-10 NOTE — H&P (Signed)
Subjective:     Patient ID: Kristen [name] is a 57 y.o. female.  HPI  Patient here for consultation for breast reduction. She has had prior consult with Dr. Marla Roe. Current 38DDD, Complains shoulder grooving, neck and back pain of several years duration, inability to run or exercise.  Has tried OTC pain medication, hot cold packs, regular exercise, wt loss without relief. Denies rashes, does note itching between breasts. Her daughter has had breast reduction. Reports foal is "cup smaller."  Has not been fitted for bra. She is disabled secondary to back pain and ruptured disc. Lives with adult daughter.   Weight - 260 lb 2 years ago, 180 lb current and goal 175 lb.  Last MMG 07/2017 benign. No family history breast ca.  Review of Systems  Musculoskeletal: Positive for back pain and neck pain.  Remainder 12 point review negative     Objective:   Physical Exam  Cardiovascular: Normal rate, regular rhythm and normal heart sounds.   Pulmonary/Chest: Effort normal and breath sounds normal.  Lymphadenopathy:    She has no axillary adenopathy.  Skin:  Fitzpatrick 6   +shoulder grooving No masses +kyphosis Grade 3 ptosis bilateral right <left volume SN to nipple R 39 L 40 cm BW R 22 L 22 cm Nipple to IMF R 13.5 L 16 cm     Assessment:     Macromastia Chronic neck and back pain    Plan:     Chronic neck and back pain in setting of macromastia that has failed conservative measures.  Reviewed reduction with anchor type scars, possible overnight hospital stay or drains, post operative visits and limitations, recovery. Diminished sensation nipple and breast skin, risk of nipple loss, wound healing problems, asymmetry, incidental carcinoma, changes with wt gain/loss, aging, unacceptable cosmetic appearance reviewed.  Anticipate 527 g reduction from each breast.     Norco prescription given for post op use.  Irene Limbo, MD [facility] Plastic & Reconstructive  Surgery [phone], pin [phone]

## 2018-02-13 ENCOUNTER — Other Ambulatory Visit: Payer: Self-pay

## 2018-02-13 ENCOUNTER — Ambulatory Visit (HOSPITAL_BASED_OUTPATIENT_CLINIC_OR_DEPARTMENT_OTHER)
Admission: RE | Admit: 2018-02-13 | Discharge: 2018-02-13 | Disposition: A | Discharge status: Home / Self Care | Payer: Medicare HMO | Source: Ambulatory Visit | Attending: Plastic Surgery | Admitting: Plastic Surgery

## 2018-02-13 ENCOUNTER — Encounter (HOSPITAL_BASED_OUTPATIENT_CLINIC_OR_DEPARTMENT_OTHER): Payer: Self-pay | Admitting: *Deleted

## 2018-02-13 ENCOUNTER — Ambulatory Visit (HOSPITAL_BASED_OUTPATIENT_CLINIC_OR_DEPARTMENT_OTHER): Payer: Medicare HMO | Admitting: Anesthesiology

## 2018-02-13 ENCOUNTER — Encounter (HOSPITAL_BASED_OUTPATIENT_CLINIC_OR_DEPARTMENT_OTHER)
Admission: RE | Disposition: A | Discharge status: Home / Self Care | Payer: Self-pay | Source: Ambulatory Visit | Attending: Plastic Surgery

## 2018-02-13 DIAGNOSIS — G4730 Sleep apnea, unspecified: Secondary | ICD-10-CM | POA: Insufficient documentation

## 2018-02-13 DIAGNOSIS — Z79899 Other long term (current) drug therapy: Secondary | ICD-10-CM | POA: Diagnosis not present

## 2018-02-13 DIAGNOSIS — M542 Cervicalgia: Secondary | ICD-10-CM | POA: Insufficient documentation

## 2018-02-13 DIAGNOSIS — N62 Hypertrophy of breast: Secondary | ICD-10-CM | POA: Insufficient documentation

## 2018-02-13 DIAGNOSIS — Z87891 Personal history of nicotine dependence: Secondary | ICD-10-CM | POA: Diagnosis not present

## 2018-02-13 DIAGNOSIS — M549 Dorsalgia, unspecified: Secondary | ICD-10-CM | POA: Insufficient documentation

## 2018-02-13 DIAGNOSIS — I10 Essential (primary) hypertension: Secondary | ICD-10-CM | POA: Insufficient documentation

## 2018-02-13 HISTORY — PX: BREAST REDUCTION SURGERY: SHX8

## 2018-02-13 HISTORY — DX: Hypertrophy of breast: N62

## 2018-02-13 SURGERY — MAMMOPLASTY, REDUCTION
Anesthesia: General | Site: Breast | Laterality: Bilateral

## 2018-02-13 MED ORDER — GABAPENTIN 300 MG PO CAPS
ORAL_CAPSULE | ORAL | Status: AC
  Filled 2018-02-13: qty 1

## 2018-02-13 MED ORDER — SUGAMMADEX SODIUM 200 MG/2ML IV SOLN
INTRAVENOUS | Status: DC | PRN
  Administered 2018-02-13: 200 mg via INTRAVENOUS

## 2018-02-13 MED ORDER — SODIUM CHLORIDE 0.9 % IV SOLN
INTRAVENOUS | Status: DC | PRN
  Administered 2018-02-13: 40 mL

## 2018-02-13 MED ORDER — FENTANYL CITRATE (PF) 100 MCG/2ML IJ SOLN
50.0000 ug | INTRAMUSCULAR | Status: AC | PRN
  Administered 2018-02-13 (×6): 50 ug via INTRAVENOUS

## 2018-02-13 MED ORDER — PROPOFOL 10 MG/ML IV BOLUS
INTRAVENOUS | Status: DC | PRN
  Administered 2018-02-13: 150 mg via INTRAVENOUS

## 2018-02-13 MED ORDER — ARTIFICIAL TEARS OPHTHALMIC OINT
TOPICAL_OINTMENT | OPHTHALMIC | Status: AC
  Filled 2018-02-13: qty 3.5

## 2018-02-13 MED ORDER — ACETAMINOPHEN 500 MG PO TABS
ORAL_TABLET | ORAL | Status: AC
  Filled 2018-02-13: qty 2

## 2018-02-13 MED ORDER — HYDROCODONE-ACETAMINOPHEN 7.5-325 MG PO TABS: 1.0000 | ORAL_TABLET | Freq: Once | ORAL | Status: DC | PRN

## 2018-02-13 MED ORDER — CHLORHEXIDINE GLUCONATE CLOTH 2 % EX PADS: 6.0000 | MEDICATED_PAD | Freq: Once | CUTANEOUS | Status: DC

## 2018-02-13 MED ORDER — HYDRALAZINE HCL 20 MG/ML IJ SOLN
5.0000 mg | Freq: Once | INTRAMUSCULAR | Status: AC
  Administered 2018-02-13: 5 mg via INTRAVENOUS

## 2018-02-13 MED ORDER — HYDROCODONE-ACETAMINOPHEN 5-325 MG PO TABS
ORAL_TABLET | ORAL | Status: AC
  Filled 2018-02-13: qty 1

## 2018-02-13 MED ORDER — CELECOXIB 200 MG PO CAPS
ORAL_CAPSULE | ORAL | Status: AC
  Filled 2018-02-13: qty 1

## 2018-02-13 MED ORDER — HYDRALAZINE HCL 20 MG/ML IJ SOLN
INTRAMUSCULAR | Status: AC
  Filled 2018-02-13: qty 1

## 2018-02-13 MED ORDER — CIPROFLOXACIN IN D5W 400 MG/200ML IV SOLN
400.0000 mg | INTRAVENOUS | Status: AC
  Administered 2018-02-13: 400 mg via INTRAVENOUS

## 2018-02-13 MED ORDER — SODIUM CHLORIDE 0.9 % IJ SOLN
INTRAMUSCULAR | Status: AC
  Filled 2018-02-13: qty 10

## 2018-02-13 MED ORDER — HEPARIN SODIUM (PORCINE) 5000 UNIT/ML IJ SOLN
INTRAMUSCULAR | Status: DC | PRN
  Administered 2018-02-13: 5000 [IU] via SUBCUTANEOUS

## 2018-02-13 MED ORDER — SUGAMMADEX SODIUM 500 MG/5ML IV SOLN
INTRAVENOUS | Status: AC
  Filled 2018-02-13: qty 5

## 2018-02-13 MED ORDER — EPHEDRINE 5 MG/ML INJ
INTRAVENOUS | Status: AC
  Filled 2018-02-13: qty 10

## 2018-02-13 MED ORDER — ROCURONIUM BROMIDE 100 MG/10ML IV SOLN
INTRAVENOUS | Status: DC | PRN
  Administered 2018-02-13: 10 mg via INTRAVENOUS
  Administered 2018-02-13: 40 mg via INTRAVENOUS

## 2018-02-13 MED ORDER — DEXAMETHASONE SODIUM PHOSPHATE 10 MG/ML IJ SOLN
INTRAMUSCULAR | Status: AC
  Filled 2018-02-13: qty 1

## 2018-02-13 MED ORDER — LIDOCAINE 2% (20 MG/ML) 5 ML SYRINGE
INTRAMUSCULAR | Status: AC
  Filled 2018-02-13: qty 5

## 2018-02-13 MED ORDER — SUCCINYLCHOLINE CHLORIDE 200 MG/10ML IV SOSY
PREFILLED_SYRINGE | INTRAVENOUS | Status: AC
  Filled 2018-02-13: qty 10

## 2018-02-13 MED ORDER — FENTANYL CITRATE (PF) 100 MCG/2ML IJ SOLN
INTRAMUSCULAR | Status: AC
  Filled 2018-02-13: qty 2

## 2018-02-13 MED ORDER — LIDOCAINE HCL (CARDIAC) PF 100 MG/5ML IV SOSY
PREFILLED_SYRINGE | INTRAVENOUS | Status: DC | PRN
  Administered 2018-02-13: 40 mg via INTRAVENOUS

## 2018-02-13 MED ORDER — 0.9 % SODIUM CHLORIDE (POUR BTL) OPTIME
TOPICAL | Status: DC | PRN
  Administered 2018-02-13: 400 mL

## 2018-02-13 MED ORDER — FENTANYL CITRATE (PF) 100 MCG/2ML IJ SOLN
25.0000 ug | INTRAMUSCULAR | Status: DC | PRN
  Administered 2018-02-13: 25 ug via INTRAVENOUS
  Administered 2018-02-13: 50 ug via INTRAVENOUS
  Administered 2018-02-13: 25 ug via INTRAVENOUS

## 2018-02-13 MED ORDER — GABAPENTIN 300 MG PO CAPS
300.0000 mg | ORAL_CAPSULE | ORAL | Status: AC
  Administered 2018-02-13: 300 mg via ORAL

## 2018-02-13 MED ORDER — ONDANSETRON HCL 4 MG/2ML IJ SOLN: 4.0000 mg | Freq: Once | INTRAMUSCULAR | Status: DC | PRN

## 2018-02-13 MED ORDER — MIDAZOLAM HCL 2 MG/2ML IJ SOLN
INTRAMUSCULAR | Status: AC
  Filled 2018-02-13: qty 2

## 2018-02-13 MED ORDER — LACTATED RINGERS IV SOLN
INTRAVENOUS | Status: DC
  Administered 2018-02-13 (×2): via INTRAVENOUS

## 2018-02-13 MED ORDER — HEPARIN SODIUM (PORCINE) 5000 UNIT/ML IJ SOLN
INTRAMUSCULAR | Status: AC
  Filled 2018-02-13: qty 1

## 2018-02-13 MED ORDER — ONDANSETRON HCL 4 MG/2ML IJ SOLN
INTRAMUSCULAR | Status: DC | PRN
  Administered 2018-02-13: 4 mg via INTRAVENOUS

## 2018-02-13 MED ORDER — CELECOXIB 200 MG PO CAPS
200.0000 mg | ORAL_CAPSULE | ORAL | Status: AC
  Administered 2018-02-13: 200 mg via ORAL

## 2018-02-13 MED ORDER — HYDROCODONE-ACETAMINOPHEN 5-325 MG PO TABS
1.0000 | ORAL_TABLET | Freq: Once | ORAL | Status: AC
  Administered 2018-02-13: 1 via ORAL

## 2018-02-13 MED ORDER — BUPIVACAINE LIPOSOME 1.3 % IJ SUSP
INTRAMUSCULAR | Status: AC
  Filled 2018-02-13: qty 20

## 2018-02-13 MED ORDER — SCOPOLAMINE 1 MG/3DAYS TD PT72: 1.0000 | MEDICATED_PATCH | Freq: Once | TRANSDERMAL | Status: DC | PRN

## 2018-02-13 MED ORDER — CIPROFLOXACIN IN D5W 400 MG/200ML IV SOLN
INTRAVENOUS | Status: AC
  Filled 2018-02-13: qty 200

## 2018-02-13 MED ORDER — DEXAMETHASONE SODIUM PHOSPHATE 4 MG/ML IJ SOLN
INTRAMUSCULAR | Status: DC | PRN
  Administered 2018-02-13: 10 mg via INTRAVENOUS

## 2018-02-13 MED ORDER — PHENYLEPHRINE 40 MCG/ML (10ML) SYRINGE FOR IV PUSH (FOR BLOOD PRESSURE SUPPORT)
PREFILLED_SYRINGE | INTRAVENOUS | Status: AC
  Filled 2018-02-13: qty 10

## 2018-02-13 MED ORDER — EPHEDRINE SULFATE 50 MG/ML IJ SOLN
INTRAMUSCULAR | Status: DC | PRN
  Administered 2018-02-13 (×2): 10 mg via INTRAVENOUS

## 2018-02-13 MED ORDER — ACETAMINOPHEN 500 MG PO TABS
1000.0000 mg | ORAL_TABLET | ORAL | Status: AC
  Administered 2018-02-13: 1000 mg via ORAL

## 2018-02-13 MED ORDER — MIDAZOLAM HCL 2 MG/2ML IJ SOLN
1.0000 mg | INTRAMUSCULAR | Status: DC | PRN
  Administered 2018-02-13: 2 mg via INTRAVENOUS

## 2018-02-13 MED ORDER — ONDANSETRON HCL 4 MG/2ML IJ SOLN
INTRAMUSCULAR | Status: AC
  Filled 2018-02-13: qty 2

## 2018-02-13 SURGICAL SUPPLY — 57 items
APPLIER CLIP 9.375 MED OPEN (MISCELLANEOUS)
BINDER BREAST 3XL (GAUZE/BANDAGES/DRESSINGS) IMPLANT
BINDER BREAST LRG (GAUZE/BANDAGES/DRESSINGS) IMPLANT
BINDER BREAST MEDIUM (GAUZE/BANDAGES/DRESSINGS) IMPLANT
BINDER BREAST XLRG (GAUZE/BANDAGES/DRESSINGS) IMPLANT
BINDER BREAST XXLRG (GAUZE/BANDAGES/DRESSINGS) ×3 IMPLANT
BLADE SURG 10 STRL SS (BLADE) ×12 IMPLANT
BNDG GAUZE ELAST 4 BULKY (GAUZE/BANDAGES/DRESSINGS) ×6 IMPLANT
CANISTER SUCT 1200ML W/VALVE (MISCELLANEOUS) ×3 IMPLANT
CHLORAPREP W/TINT 26ML (MISCELLANEOUS) ×9 IMPLANT
CLIP APPLIE 9.375 MED OPEN (MISCELLANEOUS) IMPLANT
CLIP VESOCCLUDE MED 6/CT (CLIP) IMPLANT
COVER BACK TABLE 60X90IN (DRAPES) ×3 IMPLANT
COVER MAYO STAND STRL (DRAPES) ×3 IMPLANT
DERMABOND ADVANCED (GAUZE/BANDAGES/DRESSINGS) ×8
DERMABOND ADVANCED .7 DNX12 (GAUZE/BANDAGES/DRESSINGS) ×4 IMPLANT
DRAIN CHANNEL 15F RND FF W/TCR (WOUND CARE) ×6 IMPLANT
DRAPE TOP ARMCOVERS (MISCELLANEOUS) ×3 IMPLANT
DRAPE U-SHAPE 76X120 STRL (DRAPES) ×3 IMPLANT
DRSG PAD ABDOMINAL 8X10 ST (GAUZE/BANDAGES/DRESSINGS) ×12 IMPLANT
ELECT COATED BLADE 2.86 ST (ELECTRODE) ×3 IMPLANT
ELECT REM PT RETURN 9FT ADLT (ELECTROSURGICAL) ×3
ELECTRODE REM PT RTRN 9FT ADLT (ELECTROSURGICAL) ×1 IMPLANT
EVACUATOR SILICONE 100CC (DRAIN) ×6 IMPLANT
GLOVE BIO SURGEON STRL SZ 6 (GLOVE) ×9 IMPLANT
GLOVE BIOGEL PI IND STRL 6.5 (GLOVE) ×1 IMPLANT
GLOVE BIOGEL PI IND STRL 7.0 (GLOVE) ×2 IMPLANT
GLOVE BIOGEL PI INDICATOR 6.5 (GLOVE) ×2
GLOVE BIOGEL PI INDICATOR 7.0 (GLOVE) ×4
GLOVE SURG SS PI 6.5 STRL IVOR (GLOVE) ×3 IMPLANT
GOWN STRL REUS W/ TWL LRG LVL3 (GOWN DISPOSABLE) ×3 IMPLANT
GOWN STRL REUS W/TWL LRG LVL3 (GOWN DISPOSABLE) ×6
MARKER SKIN DUAL TIP RULER LAB (MISCELLANEOUS) IMPLANT
NEEDLE HYPO 25X1 1.5 SAFETY (NEEDLE) ×3 IMPLANT
NS IRRIG 1000ML POUR BTL (IV SOLUTION) ×6 IMPLANT
PACK BASIN DAY SURGERY FS (CUSTOM PROCEDURE TRAY) ×3 IMPLANT
PENCIL BUTTON HOLSTER BLD 10FT (ELECTRODE) ×3 IMPLANT
PIN SAFETY STERILE (MISCELLANEOUS) ×3 IMPLANT
SHEET MEDIUM DRAPE 40X70 STRL (DRAPES) ×6 IMPLANT
SLEEVE SCD COMPRESS KNEE MED (MISCELLANEOUS) ×3 IMPLANT
SPONGE LAP 18X18 RF (DISPOSABLE) ×9 IMPLANT
STAPLER VISISTAT 35W (STAPLE) ×6 IMPLANT
SUT ETHILON 2 0 FS 18 (SUTURE) ×6 IMPLANT
SUT MNCRL AB 4-0 PS2 18 (SUTURE) ×18 IMPLANT
SUT PDS AB 2-0 CT2 27 (SUTURE) IMPLANT
SUT VIC AB 3-0 PS1 18 (SUTURE) ×16
SUT VIC AB 3-0 PS1 18XBRD (SUTURE) ×8 IMPLANT
SUT VICRYL 4-0 PS2 18IN ABS (SUTURE) ×6 IMPLANT
SYR BULB IRRIGATION 50ML (SYRINGE) ×3 IMPLANT
SYR CONTROL 10ML LL (SYRINGE) ×3 IMPLANT
TAPE MEASURE VINYL STERILE (MISCELLANEOUS) IMPLANT
TOWEL GREEN STERILE FF (TOWEL DISPOSABLE) ×9 IMPLANT
TRAY FOLEY W/BAG SLVR 14FR LF (SET/KITS/TRAYS/PACK) ×3 IMPLANT
TUBE CONNECTING 20'X1/4 (TUBING) ×1
TUBE CONNECTING 20X1/4 (TUBING) ×2 IMPLANT
UNDERPAD 30X30 (UNDERPADS AND DIAPERS) ×6 IMPLANT
YANKAUER SUCT BULB TIP NO VENT (SUCTIONS) ×3 IMPLANT

## 2018-02-13 NOTE — Discharge Instructions (Signed)
Hydrocodone 5 mg given at 1245. Ok to take next dose at 445pm today. Post Anesthesia Home Care Instructions  Activity: Get plenty of rest for the remainder of the day. A responsible individual must stay with you for 24 hours following the procedure.  For the next 24 hours, DO NOT: -Drive a car -Paediatric nurse -Drink alcoholic beverages -Take any medication unless instructed by your physician -Make any legal decisions or sign important papers.  Meals: Start with liquid foods such as gelatin or soup. Progress to regular foods as tolerated. Avoid greasy, spicy, heavy foods. If nausea and/or vomiting occur, drink only clear liquids until the nausea and/or vomiting subsides. Call your physician if vomiting continues.  Special Instructions/Symptoms: Your throat may feel dry or sore from the anesthesia or the breathing tube placed in your throat during surgery. If this causes discomfort, gargle with warm salt water. The discomfort should disappear within 24 hours.  If you had a scopolamine patch placed behind your ear for the management of post- operative nausea and/or vomiting:  1. The medication in the patch is effective for 72 hours, after which it should be removed.  Wrap patch in a tissue and discard in the trash. Wash hands thoroughly with soap and water. 2. You may remove the patch earlier than 72 hours if you experience unpleasant side effects which may include dry mouth, dizziness or visual disturbances. 3. Avoid touching the patch. Wash your hands with soap and water after contact with the patch.     Information for Discharge Teaching: EXPAREL (bupivacaine liposome injectable suspension)   Your surgeon or anesthesiologist gave you EXPAREL(bupivacaine) to help control your pain after surgery.   EXPAREL is a local anesthetic that provides pain relief by numbing the tissue around the surgical site.  EXPAREL is designed to release pain medication over time and can control pain for  up to 72 hours.  Depending on how you respond to EXPAREL, you may require less pain medication during your recovery.  Possible side effects:  Temporary loss of sensation or ability to move in the area where bupivacaine was injected.  Nausea, vomiting, constipation  Rarely, numbness and tingling in your mouth or lips, lightheadedness, or anxiety may occur.  Call your doctor right away if you think you may be experiencing any of these sensations, or if you have other questions regarding possible side effects.  Follow all other discharge instructions given to you by your surgeon or nurse. Eat a healthy diet and drink plenty of water or other fluids.  If you return to the hospital for any reason within 96 hours following the administration of EXPAREL, it is important for health care providers to know that you have received this anesthetic. A teal colored band has been placed on your arm with the date, time and amount of EXPAREL you have received in order to alert and inform your health care providers. Please leave this armband in place for the full 96 hours following administration, and then you may remove the band.      JP Drain [employer] this sheet to all of your post-operative appointments while you have your drains.  Please measure your drains by CC's or ML's.  Make sure you drain and measure your JP Drains 2 or 3 times per day.  At the end of each day, add up totals for the left side and add up totals for the right side.    ( 9 am )     ( 3  pm )        ( 9 pm )                Date L  R  L  R  L  R  Total L/R                                                                                                                                                                                        About my Jackson-Pratt Bulb Drain  What is a Jackson-Pratt bulb? A Jackson-Pratt is a soft, round device used to collect drainage. It is connected to a long, thin drainage  catheter, which is held in place by one or two small stiches near your surgical incision site. When the bulb is squeezed, it forms a vacuum, forcing the drainage to empty into the bulb.  Emptying the Jackson-Pratt bulb- To empty the bulb: 1. Release the plug on the top of the bulb. 2. Pour the bulb's contents into a measuring container which your nurse will provide. 3. Record the time emptied and amount of drainage. Empty the drain(s) as often as your     doctor or nurse recommends.  Date                  Time                    Amount (Drain 1)                 Amount (Drain 2)  _____________________________________________________________________  _____________________________________________________________________  _____________________________________________________________________  _____________________________________________________________________  _____________________________________________________________________  _____________________________________________________________________  _____________________________________________________________________  _____________________________________________________________________  Squeezing the Jackson-Pratt Bulb- To squeeze the bulb: 1. Make sure the plug at the top of the bulb is open. 2. Squeeze the bulb tightly in your fist. You will hear air squeezing from the bulb. 3. Replace the plug while the bulb is squeezed. 4. Use a safety pin to attach the bulb to your clothing. This will keep the catheter from     pulling at the bulb insertion site.  When to call your doctor- Call your doctor if:  Drain site becomes red, swollen or hot.  You have a fever greater than 101 degrees F.  There is oozing at the drain site.  Drain falls out (apply a guaze bandage over the drain hole and secure it with tape).  Drainage increases daily not related to activity patterns. (You will usually have more drainage when you are active than  when you are resting.)  Drainage has a bad odor.

## 2018-02-13 NOTE — Anesthesia Preprocedure Evaluation (Signed)
Anesthesia Evaluation  Patient identified by MRN, date of birth, ID band Patient awake    Reviewed: Allergy & Precautions, NPO status , Patient's Chart, lab work & pertinent test results, reviewed documented beta blocker date and time   Airway Mallampati: II  TM Distance: >3 FB Neck ROM: Full    Dental  (+) Dental Advisory Given   Pulmonary sleep apnea , former smoker,    breath sounds clear to auscultation       Cardiovascular hypertension, Pt. on medications and Pt. on home beta blockers  Rhythm:Regular Rate:Normal     Neuro/Psych  Neuromuscular disease    GI/Hepatic negative GI ROS, Neg liver ROS,   Endo/Other  negative endocrine ROS  Renal/GU negative Renal ROS     Musculoskeletal   Abdominal   Peds  Hematology negative hematology ROS (+)   Anesthesia Other Findings   Reproductive/Obstetrics                             Anesthesia Physical Anesthesia Plan  ASA: II  Anesthesia Plan: General   Post-op Pain Management:    Induction: Intravenous  PONV Risk Score and Plan: 3 and Dexamethasone, Ondansetron, Treatment may vary due to age or medical condition and Midazolam  Airway Management Planned: Oral ETT  Additional Equipment:   Intra-op Plan:   Post-operative Plan: Extubation in OR  Informed Consent: I have reviewed the patients History and Physical, chart, labs and discussed the procedure including the risks, benefits and alternatives for the proposed anesthesia with the patient or authorized representative who has indicated his/her understanding and acceptance.   Dental advisory given  Plan Discussed with: CRNA  Anesthesia Plan Comments:         Anesthesia Quick Evaluation

## 2018-02-13 NOTE — Anesthesia Postprocedure Evaluation (Signed)
Anesthesia Post Note  Patient: Kristen [name]  Procedure(s) Performed: MAMMARY REDUCTION  (BREAST) (Bilateral Breast)     Patient location during evaluation: PACU Anesthesia Type: General Level of consciousness: awake and alert Pain management: pain level controlled Vital Signs Assessment: post-procedure vital signs reviewed and stable Respiratory status: spontaneous breathing, nonlabored ventilation and respiratory function stable Cardiovascular status: blood pressure returned to baseline and stable Postop Assessment: no apparent nausea or vomiting Anesthetic complications: no    Last Vitals:  Vitals:   02/13/18 1240 02/13/18 1252  BP: (!) 181/103 (!) 187/99  Pulse: 76 77  Resp: 14 16  Temp:  36.4 C  SpO2: 94% 94%    Last Pain:  Vitals:   02/13/18 1246  TempSrc:   PainSc: 5                  Kasiyah Platter,W. EDMOND

## 2018-02-13 NOTE — Interval H&P Note (Signed)
History and Physical Interval Note:  0/76/8088 1:10 AM  Kristen [name]  has presented today for surgery, with the diagnosis of macromastia chronic neck and back pain  The various methods of treatment have been discussed with the patient and family. After consideration of risks, benefits and other options for treatment, the patient has consented to  Procedure(s): MAMMARY REDUCTION  (BREAST) (Bilateral) as a surgical intervention .  The patient's history has been reviewed, patient examined, no change in status, stable for surgery.  I have reviewed the patient's chart and labs.  Questions were answered to the patient's satisfaction.     Arnoldo Hooker Bryker Fletchall

## 2018-02-13 NOTE — Op Note (Signed)
Operative Note   DATE OF OPERATION: 9.27.19  LOCATION: [facility]-outpatient  SURGICAL DIVISION: Plastic Surgery  PREOPERATIVE DIAGNOSES:  1. Macromastia 2. Chronic neck and back pain  POSTOPERATIVE DIAGNOSES:  same  PROCEDURE:  Bilateral breast reduction  SURGEON: Irene Limbo MD MBA  ASSISTANT: none  ANESTHESIA:  General.   EBL: 35 ml  COMPLICATIONS: None immediate.   INDICATIONS FOR PROCEDURE:  The patient, Kristen [name], is a 57 y.o. female born on 1960-09-26, is here for breast reduction in setting of macromastia and chronic back pain that has failed conservative measures.   FINDINGS: Right reduction 669 g Left 752 g  DESCRIPTION OF PROCEDURE:  The patient was marked standing in the preoperative area to mark sternal notch, chest midline, anterior axillary lines, inframammary folds. The location of new nipple areolar complex was marked at level of on inframammary fold on anterior surface breast by palpation. This was marked symmetric over bilateral breasts. With aid of Wise pattern marker, location of new nipple areolar complex and vertical limbs (8cm) were marked. The patient was taken to the operating room. SCDs were placed and IV antibiotics were given. The patient's operative site was prepped and draped in a sterile fashion. A time out was performed and all information was confirmed to be correct.   Over left breast, superomedial pedicle marked and nipple areolar complex marked with96mm diameter marker. Pedicle deepithlialized and developedto chest wall. Breast tissue resected over lower pole. Medial and lateral flaps developed.Additional superior pole and lateral chest wall tissue excised.Breast tailor tacked closed.  I then directed attention to right breast where superomedial pedicle designed. The pedicle was deepithelialized. Pedicle developed until tension free rotation was possible.Breast tissue resected over lower pole. Medial and lateral flaps  developed.Additional superior pole and lateral chest wall tissue excised. Breast tailor tacked closed, and patient brought to upright sitting position and assessed for symmetry. Patent returned to supine position and breast cavities irrigated and hemostasis obtained. Exparel infiltrated throughout each breast. 15 Fr JP placed in each breast and secured with 2-0 nylon. Closure completed bilateralwith 3-0 vicryl to approximate dermis along inframammary fold and vertical limb. NAC inset with 4-0 vicryl in dermis. Skin closure completed with 4-0 monocryl subcuticular throughout.Tissue adhesive applied. Dry dressing and breast binder applied. The patient was allowed to wake from anesthesia, extubated and taken to the recovery room in satisfactory condition.   SPECIMENS: right and left breast reduction  DRAINS: 15 Fr JP in right and left breast  Irene Limbo, MD [facility] Plastic & Reconstructive Surgery [phone], pin [phone]

## 2018-02-13 NOTE — Anesthesia Procedure Notes (Signed)

## 2018-02-13 NOTE — Transfer of Care (Signed)
Immediate Anesthesia Transfer of Care Note  Patient: Neiva Maenza Ballinger  Procedure(s) Performed: MAMMARY REDUCTION  (BREAST) (Bilateral Breast)  Patient Location: PACU  Anesthesia Type:General  Level of Consciousness: sedated  Airway & Oxygen Therapy: Patient Spontanous Breathing and Patient connected to face mask oxygen  Post-op Assessment: Report given to RN and Post -op Vital signs reviewed and stable  Post vital signs: Reviewed and stable  Last Vitals:  Vitals Value Taken Time  BP 156/92 02/13/2018 11:15 AM  Temp    Pulse 72 02/13/2018 11:16 AM  Resp 19 02/13/2018 11:16 AM  SpO2 98 % 02/13/2018 11:16 AM  Vitals shown include unvalidated device data.  Last Pain:  Vitals:   02/13/18 0623  TempSrc: Oral  PainSc: 0-No pain      Patients Stated Pain Goal: 0 (19/75/88 3254)  Complications: No apparent anesthesia complications

## 2018-02-16 ENCOUNTER — Encounter (HOSPITAL_BASED_OUTPATIENT_CLINIC_OR_DEPARTMENT_OTHER): Payer: Self-pay | Admitting: Plastic Surgery

## 2018-03-11 ENCOUNTER — Other Ambulatory Visit: Payer: Self-pay | Admitting: *Deleted

## 2018-03-11 DIAGNOSIS — I10 Essential (primary) hypertension: Secondary | ICD-10-CM

## 2018-03-12 MED ORDER — CARVEDILOL 25 MG PO TABS: 25.0000 mg | ORAL_TABLET | Freq: Two times a day (BID) | ORAL | 0 refills | Status: DC

## 2018-03-12 NOTE — Telephone Encounter (Signed)
Refill sent. Patient out x several days.  Danley Danker, RN [facility] RN)

## 2018-03-26 ENCOUNTER — Ambulatory Visit (INDEPENDENT_AMBULATORY_CARE_PROVIDER_SITE_OTHER): Payer: Medicare HMO | Admitting: Student in an Organized Health Care Education/Training Program

## 2018-03-26 ENCOUNTER — Other Ambulatory Visit: Payer: Self-pay

## 2018-03-26 ENCOUNTER — Encounter: Payer: Self-pay | Admitting: Student in an Organized Health Care Education/Training Program

## 2018-03-26 VITALS — BP 130/60 | HR 82 | Temp 98.2°F | Ht 63.0 in | Wt 176.4 lb

## 2018-03-26 DIAGNOSIS — J988 Other specified respiratory disorders: Secondary | ICD-10-CM | POA: Diagnosis not present

## 2018-03-26 DIAGNOSIS — Z23 Encounter for immunization: Secondary | ICD-10-CM

## 2018-03-26 DIAGNOSIS — M62838 Other muscle spasm: Secondary | ICD-10-CM

## 2018-03-26 DIAGNOSIS — L700 Acne vulgaris: Secondary | ICD-10-CM | POA: Insufficient documentation

## 2018-03-26 DIAGNOSIS — R05 Cough: Secondary | ICD-10-CM | POA: Diagnosis not present

## 2018-03-26 DIAGNOSIS — R059 Cough, unspecified: Secondary | ICD-10-CM

## 2018-03-26 MED ORDER — SALINE SPRAY 0.65 % NA SOLN: 1.0000 | NASAL | 0 refills | Status: DC | PRN

## 2018-03-26 MED ORDER — METHOCARBAMOL 500 MG PO TABS: 500.0000 mg | ORAL_TABLET | Freq: Three times a day (TID) | ORAL | 0 refills | Status: DC | PRN

## 2018-03-26 NOTE — Assessment & Plan Note (Signed)
Non-erythematous, expressed some fluid in office- clear, non painful Recommend warm compresses for spontaneous drainage Monitor for signs of infection

## 2018-03-26 NOTE — Patient Instructions (Signed)
It was a pleasure to see you today!  To summarize our discussion for this visit:  Robaxin and topical voltaren gel for neck/back pain and muscle spasm  Saline nasal spray for congestion  Warm compresses for back clogged poor  Flu shot! yay!  Some additional health maintenance measures we should update are: . Pap smear . Influenza vaccination   Please return to our clinic to see me in your earliest convenience for pap smear.  Call the clinic at [phone] if your symptoms worsen or you have any concerns.  Thank you for allowing me to take part in your care,  Dr. Doristine Mango   Thanks for choosing [facility] for your primary care.

## 2018-03-26 NOTE — Assessment & Plan Note (Signed)
Patient has upper airway congestion with phlegm production but no cough, fever, or sinus pain OTC mucinex is helping Prescribed saline nasal spray

## 2018-03-26 NOTE — Progress Notes (Signed)
   Subjective:    Patient ID: Kristen [name], female    DOB: December 26, 1960, 57 y.o.   MRN: [id number]   CC: back, neck pain, a "bump" on her back, and sinus congestion  HPI: patient states that she is having an exaggeration of her chronic neck muscle spasm. It has been occurring for about a month. She states that it is similar to past presentations that have been resolved with robaxin. She denies any specific injury or radicular symptoms. she has full ROM. She recently had a breast reduction which has also improved the pain Patient has had a bump on her back for about 1 week that is not painful or itchy. She wants me to look at it. She states that she has had sinus congestion for a couple weeks resulting in phlegm production which is improved with OTC mucinex. She denies cough, fever, or sinus pressure.   Smoking status reviewed   ROS: pertinent noted in the HPI   Past Medical History:  Diagnosis Date  . Allergy   . Back pain    Seen by Neurosurery  . Hyperlipidemia   . Hypertension   . Leg pain    Bil  . Macromastia   . Obesity   . Peripheral neuropathy   . Post-menopausal   . Sleep apnea    no cpap    Past Surgical History:  Procedure Laterality Date  . BREAST REDUCTION SURGERY Bilateral 02/13/2018   Procedure: MAMMARY REDUCTION  (BREAST);  Surgeon: Irene Limbo, MD;  Location: [facility];  Service: Plastics;  Laterality: Bilateral;  . carpel tunnel release Bilateral   . COLONOSCOPY    . LAMINECTOMY  01/20/2008   L4-L5 by Dr Latanya Maudlin (Ortho), [facility]  . POLYPECTOMY    . TUBAL LIGATION      Past medical history, surgical, family, and social history reviewed and updated in the EMR as appropriate.  Objective:  BP 130/60   Pulse 82   Temp 98.2 F (36.8 C) (Oral)   Ht 5\' 3"  (1.6 m)   Wt 176 lb 6.4 oz (80 kg)   SpO2 96%   BMI 31.25 kg/m   Vitals and nursing note reviewed  General: NAD, pleasant, able to participate in  exam Neck: full ROM, painless Cardiac: RRR, S1 S2 present. normal heart sounds, no murmurs. Respiratory: CTAB, normal effort, No wheezes, rales or rhonchi Extremities: no edema or cyanosis. Skin: warm and dry, no rashes noted Patient has what appears to be large open comedone lateral to T1- non-erythematous or draining at time of exam, non-painful to palpation- some clear liquid expressed Neuro: alert, no obvious focal deficits Psych: Normal affect and mood   Assessment & Plan:    Muscle spasm Re-prescribe robaxin- counseled patient on possible sedating effects Prescribe Voltaren gel Return if not improved  Open comedone Non-erythematous, expressed some fluid in office- clear, non painful Recommend warm compresses for spontaneous drainage Monitor for signs of infection  Congestion of upper airway Patient has upper airway congestion with phlegm production but no cough, fever, or sinus pain OTC mucinex is helping Prescribed saline nasal spray  Patient received influenza shot today PHQ9 score 0 today Patient is overdue for her pap smear and is already scheduled for later this month  Doristine Mango, [place] PGY-1

## 2018-03-26 NOTE — Assessment & Plan Note (Signed)
Re-prescribe robaxin- counseled patient on possible sedating effects Prescribe Voltaren gel Return if not improved

## 2018-04-14 ENCOUNTER — Other Ambulatory Visit: Payer: Self-pay

## 2018-04-14 ENCOUNTER — Ambulatory Visit (INDEPENDENT_AMBULATORY_CARE_PROVIDER_SITE_OTHER): Payer: Medicare HMO

## 2018-04-14 VITALS — BP 118/82 | HR 64 | Temp 97.9°F | Ht 63.0 in | Wt 171.2 lb

## 2018-04-14 DIAGNOSIS — Z0000 Encounter for general adult medical examination without abnormal findings: Secondary | ICD-10-CM

## 2018-04-14 MED ORDER — CETIRIZINE HCL 10 MG PO CAPS: 10.0000 mg | ORAL_CAPSULE | Freq: Every day | ORAL | 2 refills | Status: DC

## 2018-04-14 MED ORDER — FLUTICASONE PROPIONATE 50 MCG/ACT NA SUSP: 1.0000 | Freq: Every day | NASAL | 2 refills | Status: DC

## 2018-04-14 MED ORDER — ALBUTEROL SULFATE HFA 108 (90 BASE) MCG/ACT IN AERS: 1.0000 | INHALATION_SPRAY | Freq: Four times a day (QID) | RESPIRATORY_TRACT | 3 refills | Status: DC | PRN

## 2018-04-14 NOTE — Patient Instructions (Addendum)
Kristen [name] , Thank you for taking time to come for your Medicare Wellness Visit. I appreciate your ongoing commitment to your health goals. Please review the following plan we discussed and let me know if I can assist you in the future.   Please keep your appointment with Dr. Ouida Sills on 05/05/18 at 10:15 am.  These are the goals we discussed: Goals    . Blood Pressure < 140/90    . Increase water intake    . Patient Stated     Continue to work on pain management.     . Weight (lb) < 205 lb (93 kg)     7% weight loss       This is a list of the screening recommended for you and due dates:  Health Maintenance  Topic Date Due  . Pap Smear  07/19/2019  . Mammogram  08/12/2019  . Colon Cancer Screening  01/16/2021  . Tetanus Vaccine  06/06/2027  . Flu Shot  Completed  .  Hepatitis C: One time screening is recommended by Center for Disease Control  (CDC) for  adults born from [birth year] through 1964.   Completed  . HIV Screening  Completed     Health Maintenance, Female Adopting a healthy lifestyle and getting preventive care can go a long way to promote health and wellness. Talk with your health care provider about what schedule of regular examinations is right for you. This is a good chance for you to check in with your provider about disease prevention and staying healthy. In between checkups, there are plenty of things you can do on your own. Experts have done a lot of research about which lifestyle changes and preventive measures are most likely to keep you healthy. Ask your health care provider for more information. Weight and diet Eat a healthy diet  Be sure to include plenty of vegetables, fruits, low-fat dairy products, and lean protein.  Do not eat a lot of foods high in solid fats, added sugars, or salt.  Get regular exercise. This is one of the most important things you can do for your health. ? Most adults should exercise for at least 150 minutes each week. The  exercise should increase your heart rate and make you sweat (moderate-intensity exercise). ? Most adults should also do strengthening exercises at least twice a week. This is in addition to the moderate-intensity exercise.  Maintain a healthy weight  Body mass index (BMI) is a measurement that can be used to identify possible weight problems. It estimates body fat based on height and weight. Your health care provider can help determine your BMI and help you achieve or maintain a healthy weight.  For females 73 years of age and older: ? A BMI below 18.5 is considered underweight. ? A BMI of 18.5 to 24.9 is normal. ? A BMI of 25 to 29.9 is considered overweight. ? A BMI of 30 and above is considered obese.  Watch levels of cholesterol and blood lipids  You should start having your blood tested for lipids and cholesterol at 55 years of age, then have this test every 5 years.  You may need to have your cholesterol levels checked more often if: ? Your lipid or cholesterol levels are high. ? You are older than 56 years of age. ? You are at high risk for heart disease.  Cancer screening Lung Cancer  Lung cancer screening is recommended for adults 48-29 years old who are at high risk for lung  cancer because of a history of smoking.  A yearly low-dose CT scan of the lungs is recommended for people who: ? Currently smoke. ? Have quit within the past 15 years. ? Have at least a 30-pack-year history of smoking. A pack year is smoking an average of one pack of cigarettes a day for 1 year.  Yearly screening should continue until it has been 15 years since you quit.  Yearly screening should stop if you develop a health problem that would prevent you from having lung cancer treatment.  Breast Cancer  Practice breast self-awareness. This means understanding how your breasts normally appear and feel.  It also means doing regular breast self-exams. Let your health care provider know about any  changes, no matter how small.  If you are in your 20s or 30s, you should have a clinical breast exam (CBE) by a health care provider every 1-3 years as part of a regular health exam.  If you are 60 or older, have a CBE every year. Also consider having a breast X-ray (mammogram) every year.  If you have a family history of breast cancer, talk to your health care provider about genetic screening.  If you are at high risk for breast cancer, talk to your health care provider about having an MRI and a mammogram every year.  Breast cancer gene (BRCA) assessment is recommended for women who have family members with BRCA-related cancers. BRCA-related cancers include: ? Breast. ? Ovarian. ? Tubal. ? Peritoneal cancers.  Results of the assessment will determine the need for genetic counseling and BRCA1 and BRCA2 testing.  Cervical Cancer Your health care provider may recommend that you be screened regularly for cancer of the pelvic organs (ovaries, uterus, and vagina). This screening involves a pelvic examination, including checking for microscopic changes to the surface of your cervix (Pap test). You may be encouraged to have this screening done every 3 years, beginning at age 66.  For women ages 16-65, health care providers may recommend pelvic exams and Pap testing every 3 years, or they may recommend the Pap and pelvic exam, combined with testing for human papilloma virus (HPV), every 5 years. Some types of HPV increase your risk of cervical cancer. Testing for HPV may also be done on women of any age with unclear Pap test results.  Other health care providers may not recommend any screening for nonpregnant women who are considered low risk for pelvic cancer and who do not have symptoms. Ask your health care provider if a screening pelvic exam is right for you.  If you have had past treatment for cervical cancer or a condition that could lead to cancer, you need Pap tests and screening for cancer  for at least 20 years after your treatment. If Pap tests have been discontinued, your risk factors (such as having a new sexual partner) need to be reassessed to determine if screening should resume. Some women have medical problems that increase the chance of getting cervical cancer. In these cases, your health care provider may recommend more frequent screening and Pap tests.  Colorectal Cancer  This type of cancer can be detected and often prevented.  Routine colorectal cancer screening usually begins at 56 years of age and continues through 56 years of age.  Your health care provider may recommend screening at an earlier age if you have risk factors for colon cancer.  Your health care provider may also recommend using home test kits to check for hidden blood in the  stool.  A small camera at the end of a tube can be used to examine your colon directly (sigmoidoscopy or colonoscopy). This is done to check for the earliest forms of colorectal cancer.  Routine screening usually begins at age 32.  Direct examination of the colon should be repeated every 5-10 years through 56 years of age. However, you may need to be screened more often if early forms of precancerous polyps or small growths are found.  Skin Cancer  Check your skin from head to toe regularly.  Tell your health care provider about any new moles or changes in moles, especially if there is a change in a mole's shape or color.  Also tell your health care provider if you have a mole that is larger than the size of a pencil eraser.  Always use sunscreen. Apply sunscreen liberally and repeatedly throughout the day.  Protect yourself by wearing long sleeves, pants, a wide-brimmed hat, and sunglasses whenever you are outside.  Heart disease, diabetes, and high blood pressure  High blood pressure causes heart disease and increases the risk of stroke. High blood pressure is more likely to develop in: ? People who have blood  pressure in the high end of the normal range (130-139/85-89 mm Hg). ? People who are overweight or obese. ? People who are African American.  If you are 67-49 years of age, have your blood pressure checked every 3-5 years. If you are 61 years of age or older, have your blood pressure checked every year. You should have your blood pressure measured twice-once when you are at a hospital or clinic, and once when you are not at a hospital or clinic. Record the average of the two measurements. To check your blood pressure when you are not at a hospital or clinic, you can use: ? An automated blood pressure machine at a pharmacy. ? A home blood pressure monitor.  If you are between 91 years and 62 years old, ask your health care provider if you should take aspirin to prevent strokes.  Have regular diabetes screenings. This involves taking a blood sample to check your fasting blood sugar level. ? If you are at a normal weight and have a low risk for diabetes, have this test once every three years after 56 years of age. ? If you are overweight and have a high risk for diabetes, consider being tested at a younger age or more often. Preventing infection Hepatitis B  If you have a higher risk for hepatitis B, you should be screened for this virus. You are considered at high risk for hepatitis B if: ? You were born in a country where hepatitis B is common. Ask your health care provider which countries are considered high risk. ? Your parents were born in a high-risk country, and you have not been immunized against hepatitis B (hepatitis B vaccine). ? You have HIV or AIDS. ? You use needles to inject street drugs. ? You live with someone who has hepatitis B. ? You have had sex with someone who has hepatitis B. ? You get hemodialysis treatment. ? You take certain medicines for conditions, including cancer, organ transplantation, and autoimmune conditions.  Hepatitis C  Blood testing is recommended  for: ? Everyone born from [birth year] through 1964. ? Anyone with known risk factors for hepatitis C.  Sexually transmitted infections (STIs)  You should be screened for sexually transmitted infections (STIs) including gonorrhea and chlamydia if: ? You are sexually active and are younger  than 56 years of age. ? You are older than 56 years of age and your health care provider tells you that you are at risk for this type of infection. ? Your sexual activity has changed since you were last screened and you are at an increased risk for chlamydia or gonorrhea. Ask your health care provider if you are at risk.  If you do not have HIV, but are at risk, it may be recommended that you take a prescription medicine daily to prevent HIV infection. This is called pre-exposure prophylaxis (PrEP). You are considered at risk if: ? You are sexually active and do not regularly use condoms or know the HIV status of your partner(s). ? You take drugs by injection. ? You are sexually active with a partner who has HIV.  Talk with your health care provider about whether you are at high risk of being infected with HIV. If you choose to begin PrEP, you should first be tested for HIV. You should then be tested every 3 months for as long as you are taking PrEP. Pregnancy  If you are premenopausal and you may become pregnant, ask your health care provider about preconception counseling.  If you may become pregnant, take 400 to 800 micrograms (mcg) of folic acid every day.  If you want to prevent pregnancy, talk to your health care provider about birth control (contraception). Osteoporosis and menopause  Osteoporosis is a disease in which the bones lose minerals and strength with aging. This can result in serious bone fractures. Your risk for osteoporosis can be identified using a bone density scan.  If you are 45 years of age or older, or if you are at risk for osteoporosis and fractures, ask your health care provider if  you should be screened.  Ask your health care provider whether you should take a calcium or vitamin D supplement to lower your risk for osteoporosis.  Menopause may have certain physical symptoms and risks.  Hormone replacement therapy may reduce some of these symptoms and risks. Talk to your health care provider about whether hormone replacement therapy is right for you. Follow these instructions at home:  Schedule regular health, dental, and eye exams.  Stay current with your immunizations.  Do not use any tobacco products including cigarettes, chewing tobacco, or electronic cigarettes.  If you are pregnant, do not drink alcohol.  If you are breastfeeding, limit how much and how often you drink alcohol.  Limit alcohol intake to no more than 1 drink per day for nonpregnant women. One drink equals 12 ounces of beer, 5 ounces of wine, or 1 ounces of hard liquor.  Do not use street drugs.  Do not share needles.  Ask your health care provider for help if you need support or information about quitting drugs.  Tell your health care provider if you often feel depressed.  Tell your health care provider if you have ever been abused or do not feel safe at home. This information is not intended to replace advice given to you by your health care provider. Make sure you discuss any questions you have with your health care provider. Document Released: 11/19/2010 Document Revised: 10/12/2015 Document Reviewed: 02/07/2015 Elsevier Interactive Patient Education  [employer].

## 2018-04-14 NOTE — Progress Notes (Signed)
Subjective:   Kristen [name] is a 57 y.o. female who presents for Medicare Annual (Subsequent) preventive examination.  Review of Systems:  Physical assessment deferred to PCP.  Cardiac Risk Factors include: hypertension;sedentary lifestyle    Objective:    Vitals: BP 118/82   Pulse 64   Temp 97.9 F (36.6 C) (Oral)   Ht 5\' 3"  (1.6 m)   Wt 171 lb 3.2 oz (77.7 kg)   SpO2 98%   BMI 30.33 kg/m   Body mass index is 30.33 kg/m.  Advanced Directives 04/14/2018 02/13/2018 02/09/2018 12/15/2017 07/08/2017 03/25/2017 03/25/2017  Does Patient Have a Medical Advance Directive? No No No No No No No  Would patient like information on creating a medical advance directive? Yes (MAU/Ambulatory/Procedural Areas - Information given) No - Patient declined No - Patient declined No - Patient declined No - Patient declined No - Patient declined -    Tobacco Social History   Tobacco Use  Smoking Status Former Smoker  . Packs/day: 0.25  . Years: 0.50  . Pack years: 0.12  . Types: Cigarettes  . Last attempt to quit: 12/19/1990  . Years since quitting: 27.3  Smokeless Tobacco Never Used  Tobacco Comment   Quit 25 years ago.     Counseling given: Yes Comment: Quit 25 years ago.   Clinical Intake:  Pre-visit preparation completed: Yes  Pain : 0-10 Pain Score: 7  Pain Type: Chronic pain Pain Onset: More than a month ago Pain Relieving Factors: marijuana Effect of Pain on Daily Activities: always in pain; neck, mid back and down legs  Pain Relieving Factors: marijuana  Nutritional Status: BMI > 30  Obese Nutritional Risks: None Diabetes: No  What is the last grade level you completed in school?: high school and some college  Interpreter Needed?: No     Past Medical History:  Diagnosis Date  . Allergy   . Back pain    Seen by Neurosurery  . Hyperlipidemia   . Hypertension   . Leg pain    Bil  . Macromastia   . Obesity   . Peripheral neuropathy   . Post-menopausal     . Sleep apnea    no cpap   Past Surgical History:  Procedure Laterality Date  . BREAST REDUCTION SURGERY Bilateral 02/13/2018   Procedure: MAMMARY REDUCTION  (BREAST);  Surgeon: Irene Limbo, MD;  Location: [place];  Service: Plastics;  Laterality: Bilateral;  . BREAST SURGERY    . carpel tunnel release Bilateral   . COLONOSCOPY    . LAMINECTOMY  01/20/2008   L4-L5 by Dr Latanya Maudlin (Ortho), [facility]  . POLYPECTOMY    . TUBAL LIGATION     Family History  Problem Relation Age of Onset  . Lupus Mother   . Heart disease Mother   . Anemia Father   . Hypertension Father   . Arthritis Brother   . Asthma Son   . Lupus Daughter   . Colon cancer Neg Hx   . Colon polyps Neg Hx   . Esophageal cancer Neg Hx   . Rectal cancer Neg Hx   . Stomach cancer Neg Hx   . Breast cancer Neg Hx    Social History   Socioeconomic History  . Marital status: Single    Spouse name: Not on file  . Number of children: 3  . Years of education: 50  . Highest education level: Some college, no degree  Occupational History  . Occupation:  Disabled  Social Needs  . Financial resource strain: Somewhat hard  . Food insecurity:    Worry: Never true    Inability: Never true  . Transportation needs:    Medical: No    Non-medical: No  Tobacco Use  . Smoking status: Former Smoker    Packs/day: 0.25    Years: 0.50    Pack years: 0.12    Types: Cigarettes    Last attempt to quit: 12/19/1990    Years since quitting: 27.3  . Smokeless tobacco: Never Used  . Tobacco comment: Quit 25 years ago.  Substance and Sexual Activity  . Alcohol use: No    Alcohol/week: 0.0 standard drinks    Comment: 05/29/16 none  . Drug use: Yes    Frequency: 4.0 times per week    Types: Marijuana    Comment: last smoked 02-08-18; uses for pain & insomnia  . Sexual activity: Yes    Birth control/protection: Post-menopausal  Lifestyle  . Physical activity:    Days per week: 3 days     Minutes per session: 30 min  . Stress: Only a little  Relationships  . Social connections:    Talks on phone: More than three times a week    Gets together: More than three times a week    Attends religious service: Never    Active member of club or organization: No    Attends meetings of clubs or organizations: Never    Relationship status: Divorced  Other Topics Concern  . Not on file  Social History Narrative   Lives at home with her daughter, grandkids (3) and great-grandson.   House is one-level, has 2 steps to get in, no hand rail. No throw rugs on the floor. No grab bars in bathroom. Has smoke alarms.   Has one dog.   Likes seafood. Eats salads and fruits. Likes to drink juices.   Occasional use of caffeine.   No real hobbies, likes to watch TV.    Outpatient Encounter Medications as of 04/14/2018  Medication Sig  . albuterol (PROVENTIL HFA;VENTOLIN HFA) 108 (90 Base) MCG/ACT inhaler Inhale 1-2 puffs into the lungs every 6 (six) hours as needed for wheezing or shortness of breath.  . carvedilol (COREG) 25 MG tablet Take 1 tablet (25 mg total) by mouth 2 (two) times daily with a meal.  . Cetirizine HCl 10 MG CAPS Take 1 capsule (10 mg total) by mouth daily for 15 days.  . fluticasone (FLONASE) 50 MCG/ACT nasal spray Place 1-2 sprays into both nostrils daily for 7 days.  Marland Kitchen gabapentin (NEURONTIN) 300 MG capsule Take 2-3 capsules (600-900 mg total) by mouth 2 (two) times daily. Take 600mg  every afternoon and 900mg  qhs  . losartan (COZAAR) 50 MG tablet TAKE 1 TABLET BY MOUTH TWICE A DAY  . meloxicam (MOBIC) 7.5 MG tablet Take 1 tablet (7.5 mg total) by mouth daily. (Patient taking differently: Take 7.5 mg by mouth daily as needed for pain. )  . methocarbamol (ROBAXIN) 500 MG tablet Take 1 tablet (500 mg total) by mouth 3 (three) times daily as needed for muscle spasms.  . sodium chloride (OCEAN) 0.65 % SOLN nasal spray Place 1 spray into both nostrils as needed for congestion.  .  traZODone (DESYREL) 50 MG tablet TAKE 1/2 TO 1 TABLET BY MOUTH AT BEDTIME IF NEEDED FOR SLEEP  . [DISCONTINUED] methocarbamol (ROBAXIN) 500 MG tablet Take 1 tablet (500 mg total) by mouth 4 (four) times daily.   No facility-administered  encounter medications on file as of 04/14/2018.     Activities of Daily Living In your present state of health, do you have any difficulty performing the following activities: 04/14/2018 02/13/2018  Hearing? N N  Vision? Y N  Comment eye exam last year, needs glasses -  Difficulty concentrating or making decisions? N N  Walking or climbing stairs? Y -  Comment yes; uses cane -  Dressing or bathing? - Y  Doing errands, shopping? N -  Preparing Food and eating ? N -  Using the Toilet? N -  In the past six months, have you accidently leaked urine? N -  Do you have problems with loss of bowel control? N -  Managing your Medications? N -  Managing your Finances? N -  Housekeeping or managing your Housekeeping? N -  Some recent data might be hidden    Patient Care Team: Richarda Osmond, DO as PCP - General Irene Limbo, MD as Consulting Physician (Plastic Surgery) [facility])    Assessment:   This is a routine wellness examination for Desare.  Exercise Activities and Dietary recommendations Current Exercise Habits: Home exercise routine, Type of exercise: walking, Time (Minutes): 30, Frequency (Times/Week): 3, Weekly Exercise (Minutes/Week): 90, Intensity: Mild  Goals    . Blood Pressure < 140/90    . Increase water intake    . Patient Stated     Continue to work on pain management.     . Weight (lb) < 205 lb (93 kg)     7% weight loss       Fall Risk Fall Risk  04/14/2018 03/26/2018 08/11/2017 07/08/2017 06/05/2017  Falls in the past year? 1 0 Yes Yes Yes  Number falls in past yr: - - 2 or more 2 or more 2 or more  Injury with Fall? 1 - - - -  Risk Factor Category  - - High Fall Risk High Fall Risk High Fall Risk    Risk for fall due to : Impaired balance/gait - - - -  Risk for fall due to: Comment - - - - -  Follow up Falls prevention discussed - - - -   Is the patient's home free of loose throw rugs in walkways, pet beds, electrical cords, etc?   yes      Grab bars in the bathroom? no      Handrails on the stairs?   no      Adequate lighting?   yes  Depression Screen PHQ 2/9 Scores 04/14/2018 03/26/2018 12/15/2017 08/11/2017  PHQ - 2 Score 0 0 0 0  PHQ- 9 Score - - - -     Cognitive Function MMSE - Mini Mental State Exam 04/14/2018  Orientation to time 5  Orientation to Place 5  Registration 3  Attention/ Calculation 5  Recall 3  Language- name 2 objects 2  Language- repeat 1  Language- follow 3 step command 3  Language- read & follow direction 1  Write a sentence 1  Copy design 1  Total score 30     6CIT Screen 04/14/2018  What Year? 0 points  What month? 0 points  What time? 0 points  Count back from 20 0 points  Months in reverse 0 points  Repeat phrase 0 points  Total Score 0    Immunization History  Administered Date(s) Administered  . Influenza Split 02/17/2012  . Influenza Whole 06/12/2007, 02/21/2009  . Influenza,inj,Quad PF,6+ Mos 06/22/2013, 02/21/2015, 12/28/2015, 03/10/2017, 03/26/2018  .  Td 12/23/2003  . Tdap 06/05/2017    Screening Tests Health Maintenance  Topic Date Due  . PAP SMEAR  07/19/2019  . MAMMOGRAM  08/12/2019  . COLONOSCOPY  01/16/2021  . TETANUS/TDAP  06/06/2027  . INFLUENZA VACCINE  Completed  . Hepatitis C Screening  Completed  . HIV Screening  Completed    Cancer Screenings: Lung: Low Dose CT Chest recommended if Age 13-80 years, 30 pack-year currently smoking OR have quit w/in 15years. Patient does not qualify. Breast:  Up to date on Mammogram? Yes   Up to date of Bone Density/Dexa? No Colorectal: up to date  Additional Screenings: : Hepatitis C Screening: complete Pap - needed     Plan:  Appt made with PCP for pap smear  and HTN f/u. Patient is in need of some refills which will be sent under separate encounter.   I have personally reviewed and noted the following in the patient's chart:   . Medical and social history . Use of alcohol, tobacco or illicit drugs  . Current medications and supplements . Functional ability and status . Nutritional status . Physical activity . Advanced directives . List of other physicians . Hospitalizations, surgeries, and ER visits in previous 12 months . Vitals . Screenings to include cognitive, depression, and falls . Referrals and appointments  In addition, I have reviewed and discussed with patient certain preventive protocols, quality metrics, and best practice recommendations. A written personalized care plan for preventive services as well as general preventive health recommendations were provided to patient.     Esau Grew, RN  04/14/2018

## 2018-04-24 ENCOUNTER — Other Ambulatory Visit: Payer: Self-pay

## 2018-05-05 ENCOUNTER — Other Ambulatory Visit (HOSPITAL_COMMUNITY)
Admission: RE | Admit: 2018-05-05 | Discharge: 2018-05-05 | Disposition: A | Discharge status: Home / Self Care | Payer: Medicare HMO | Source: Ambulatory Visit | Attending: Family Medicine | Admitting: Family Medicine

## 2018-05-05 ENCOUNTER — Other Ambulatory Visit: Payer: Self-pay

## 2018-05-05 ENCOUNTER — Ambulatory Visit (INDEPENDENT_AMBULATORY_CARE_PROVIDER_SITE_OTHER): Payer: Medicare HMO | Admitting: Student in an Organized Health Care Education/Training Program

## 2018-05-05 ENCOUNTER — Encounter: Payer: Self-pay | Admitting: Student in an Organized Health Care Education/Training Program

## 2018-05-05 VITALS — BP 120/68 | HR 89 | Temp 98.2°F | Ht 63.0 in | Wt 176.0 lb

## 2018-05-05 DIAGNOSIS — Z124 Encounter for screening for malignant neoplasm of cervix: Secondary | ICD-10-CM | POA: Diagnosis not present

## 2018-05-05 DIAGNOSIS — M549 Dorsalgia, unspecified: Secondary | ICD-10-CM | POA: Diagnosis not present

## 2018-05-05 DIAGNOSIS — M79605 Pain in left leg: Secondary | ICD-10-CM

## 2018-05-05 DIAGNOSIS — R10811 Right upper quadrant abdominal tenderness: Secondary | ICD-10-CM | POA: Diagnosis not present

## 2018-05-05 DIAGNOSIS — M79604 Pain in right leg: Secondary | ICD-10-CM | POA: Diagnosis not present

## 2018-05-05 DIAGNOSIS — G8929 Other chronic pain: Secondary | ICD-10-CM

## 2018-05-05 DIAGNOSIS — F5104 Psychophysiologic insomnia: Secondary | ICD-10-CM | POA: Diagnosis not present

## 2018-05-05 DIAGNOSIS — I10 Essential (primary) hypertension: Secondary | ICD-10-CM

## 2018-05-05 DIAGNOSIS — Z01419 Encounter for gynecological examination (general) (routine) without abnormal findings: Secondary | ICD-10-CM

## 2018-05-05 DIAGNOSIS — J301 Allergic rhinitis due to pollen: Secondary | ICD-10-CM

## 2018-05-05 DIAGNOSIS — M62830 Muscle spasm of back: Secondary | ICD-10-CM

## 2018-05-05 MED ORDER — LORATADINE 10 MG PO TABS: 10.0000 mg | ORAL_TABLET | Freq: Every day | ORAL | 0 refills | Status: DC

## 2018-05-05 MED ORDER — ACETAMINOPHEN 325 MG PO TABS: 650.0000 mg | ORAL_TABLET | Freq: Four times a day (QID) | ORAL | 0 refills | Status: DC | PRN

## 2018-05-05 MED ORDER — GABAPENTIN 300 MG PO CAPS: 600.0000 mg | ORAL_CAPSULE | Freq: Two times a day (BID) | ORAL | 2 refills | Status: DC

## 2018-05-05 MED ORDER — CARVEDILOL 25 MG PO TABS: 25.0000 mg | ORAL_TABLET | Freq: Two times a day (BID) | ORAL | 0 refills | Status: DC

## 2018-05-05 MED ORDER — CYCLOBENZAPRINE HCL 10 MG PO TABS: 10.0000 mg | ORAL_TABLET | Freq: Every day | ORAL | 0 refills | Status: DC

## 2018-05-05 MED ORDER — LOSARTAN POTASSIUM 50 MG PO TABS: 50.0000 mg | ORAL_TABLET | Freq: Two times a day (BID) | ORAL | 1 refills | Status: DC

## 2018-05-05 MED ORDER — TRAZODONE HCL 50 MG PO TABS: 50.0000 mg | ORAL_TABLET | Freq: Every day | ORAL | 1 refills | Status: DC

## 2018-05-05 NOTE — Assessment & Plan Note (Addendum)
BP well controlled at visit today 120/68 in office Patient adherent to current regimen Refilled prescriptions Discussed f/u in 6 months with plan to decrease pill burden 2/2 lifestyle changes and good BP control

## 2018-05-05 NOTE — Patient Instructions (Signed)
It was a pleasure to see you today!  To summarize our discussion for this visit:  We performed a PAP smear- if all results are normal, I will send you a letter in the mail. If anything is abnormal, I will call you to set up an appointment for follow up.  Your blood pressure looks great! Let's stay on the same medications for now and we can discuss decreasing them at the next appointment.  I am going to refill all your medications at this visit  Your right side hurts so we will try to rotate mattress and a muscle relaxant to help relax the tense muscles. Only take this muscle relaxer at night time and not before driving.  Some additional health maintenance measures we should update are: Marland Kitchen You are all up to date! Great job!   Please return to our clinic to see me in 6 months for blood pressure follow up.  Call the clinic at [phone] if your symptoms worsen or you have any concerns.  Thank you for allowing me to take part in your care,  Dr. Doristine Mango   Thanks for choosing [facility] for your primary care.

## 2018-05-05 NOTE — Assessment & Plan Note (Signed)
Previous normal Routine Pap smear Discussed mailing results to patient when resulted

## 2018-05-05 NOTE — Assessment & Plan Note (Signed)
x3 weeks, muscle tension palpated, otherwise benign exam Recommended patient rotate/flip her mattress or add padding/new mattress Prescribed flexeril to be taken at night x14 days Discontinued robaxin  Refilled tylenol which patient states helps the pain Return if not improved in 2 weeks

## 2018-05-05 NOTE — Progress Notes (Signed)
Subjective:    Patient ID: Kristen [name], female    DOB: [birth year], 57 y.o.   MRN: [id number]   CC: right flank/back pain, PAP smear, HTN f/u with medication refill  HPI: Patient states that she has 3 weeks of increased achy right flank/back pain. She denies any injury or increased use of those muscles. She denies rashes or pruritus. She states that the pain is worse when walking and resolved with rest. It does wake her up at times. It does not prevent her from accomplishing daily activities. She has no associated N/V, diarrhea, or urine changes. She denies any association with eating times. She has had similar problem before. She has tried tylenol which has helped the pain. Throughout our discussion she realized that it is probably due to her mattress which has springs that protrude.  She is due for a PAP smear. Previous exams have been normal and HPV(-).   She has history of HTN on carvedilol and losartan. She did take them this morning and takes them every day as prescribed. She needs a refill.  Smoking status reviewed   ROS: pertinent noted in the HPI   Past Medical History:  Diagnosis Date  . Allergy   . Back pain    Seen by Neurosurery  . Hyperlipidemia   . Hypertension   . Leg pain    Bil  . Macromastia   . Obesity   . Peripheral neuropathy   . Post-menopausal   . Sleep apnea    no cpap    Past Surgical History:  Procedure Laterality Date  . BREAST REDUCTION SURGERY Bilateral 02/13/2018   Procedure: MAMMARY REDUCTION  (BREAST);  Surgeon: Irene Limbo, MD;  Location: [place];  Service: Plastics;  Laterality: Bilateral;  . BREAST SURGERY    . carpel tunnel release Bilateral   . COLONOSCOPY    . LAMINECTOMY  01/20/2008   L4-L5 by Dr Latanya Maudlin (Ortho), [facility]  . POLYPECTOMY    . TUBAL LIGATION      Past medical history, surgical, family, and social history reviewed and updated in the EMR as appropriate.  Objective:    BP 120/68   Pulse 89   Temp 98.2 F (36.8 C) (Oral)   Ht 5\' 3"  (1.6 m)   Wt 176 lb (79.8 kg)   BMI 31.18 kg/m   Vitals and nursing note reviewed  General: NAD, pleasant, able to participate in exam Cardiac: RRR, S1 S2 present. normal heart sounds, no murmurs. Respiratory: CTAB, normal effort, No wheezes, rales or rhonchi Extremities: no edema or cyanosis. Skin: warm and dry, no rashes noted Neuro: alert, no obvious focal deficits MSK: right flank has noticeable muscle tension around ribs. There are no skin changes or soft tissue masses. Full, painless ROM of thoracic spine.  Psych: Normal affect and mood GYN:  Patient cervical exam benign. No obvious lesions or abnormalities of cervix or vaginal tissues. Cervix was midline and easily visualized. Mild clear discharge and no foul odor.   Assessment & Plan:    Back muscle spasm x3 weeks, muscle tension palpated, otherwise benign exam Recommended patient rotate/flip her mattress or add padding/new mattress Prescribed flexeril to be taken at night x14 days Discontinued robaxin  Refilled tylenol which patient states helps the pain Return if not improved in 2 weeks   Encounter for routine gynecological examination with Papanicolaou smear of cervix Previous normal Routine Pap smear Discussed mailing results to patient when resulted   HYPERTENSION, BENIGN SYSTEMIC  BP well controlled at visit today 120/68 in office Patient adherent to current regimen Refilled prescriptions Discussed f/u in 6 months with plan to decrease pill burden 2/2 lifestyle changes and good BP control    Doristine Mango, DO [place] [facility] PGY-1

## 2018-05-08 LAB — CYTOLOGY - PAP
Diagnosis: NEGATIVE
HPV: NOT DETECTED

## 2018-05-11 ENCOUNTER — Encounter: Payer: Self-pay | Admitting: Student in an Organized Health Care Education/Training Program

## 2018-06-18 ENCOUNTER — Ambulatory Visit (INDEPENDENT_AMBULATORY_CARE_PROVIDER_SITE_OTHER): Payer: Medicare HMO | Admitting: Family Medicine

## 2018-06-18 ENCOUNTER — Encounter: Payer: Self-pay | Admitting: Family Medicine

## 2018-06-18 ENCOUNTER — Other Ambulatory Visit: Payer: Self-pay

## 2018-06-18 ENCOUNTER — Ambulatory Visit (INDEPENDENT_AMBULATORY_CARE_PROVIDER_SITE_OTHER): Payer: Medicare HMO | Admitting: Licensed Clinical Social Worker

## 2018-06-18 VITALS — BP 120/80 | HR 76 | Temp 98.9°F | Wt 175.0 lb

## 2018-06-18 DIAGNOSIS — F329 Major depressive disorder, single episode, unspecified: Secondary | ICD-10-CM

## 2018-06-18 DIAGNOSIS — L299 Pruritus, unspecified: Secondary | ICD-10-CM

## 2018-06-18 DIAGNOSIS — F331 Major depressive disorder, recurrent, moderate: Secondary | ICD-10-CM | POA: Diagnosis not present

## 2018-06-18 DIAGNOSIS — R61 Generalized hyperhidrosis: Secondary | ICD-10-CM | POA: Diagnosis not present

## 2018-06-18 MED ORDER — CITALOPRAM HYDROBROMIDE 20 MG PO TABS: 20.0000 mg | ORAL_TABLET | Freq: Every day | ORAL | 1 refills | Status: DC

## 2018-06-18 NOTE — Progress Notes (Addendum)
Subjective:    Patient ID: Kristen [name], female    DOB: 08-15-1960, 58 y.o.   MRN: [id number]   CC: depression, night sweats, itching on body  HPI: Kristen [name] is a 58 year old female with a past history significant for obstructive sleep apnea, prediabetes, idiopathic peripheral neuropathy, hypertension, and menopause presenting discuss the following:  Depression: Patient reports she has had a overwhelming sense of feeling down and depressed, poor appetite, and poor sleep for the last few weeks.  Insidious onset.  She is not able to identify any triggers or stressors currently in her life.  Denies any SI or HI.  Has a history of depression, previously took Cymbalta several years ago.  Did not feel Cymbalta was effective, however states she feels like she can give it a long enough chance to work.  Has 2 daughters in the area, good relationship with them.  Has a long-term significant other but she lives with, 38 years, feels safe in this relationship.  She also endorses feeling anxious, worrying constantly.  She is mainly worried about her age and her health.  Interested in therapy.  PHQ 9 score 22, 0 for suicidal ideations.  Gad score 15.  Patient marked the symptoms made her life somewhat difficult, however expresses a lot of stress with the symptoms during our conversation.  Night sweats: Sudden onset, been present for about a year.  Occurring every night, 1-2 times a night.  Will last about 30 minutes, sometimes waking her from sleep.  Will have to change her clothes if she slept with close, often sleeps without close.  Does not need to change the sheets.  States she will have beads of sweat dripping from her neck.  Denies any associated flushing, palpitations, fever, chills, weight loss, change in bowel movements, lightheadedness, dizziness.  She does note she feels more tired throughout the day. Went through menopause in her early 59s.  Keeps her house at 75 degrees, has no changes for  several years.  Has not started using more blankets than usual.  Had a colonoscopy at age 53, states she was due for one at 8 years, one polyp found at that time.  Had a negative mammogram last year.  Up-to-date on Pap smear completed in 04/2018, normal.    Pruritus: Started insidiously around summer 2019, states that her hands will itch on the dorsal aspects bilaterally, sometimes interdigitally, on her back, and on her flanks.  Randomly occurs, resolves on own.  Denies any rashes prior to itching.  Sometimes will itch for so long that a lesion appears.  No history of skin disorders.  No association with showers, certain foods.  Does not use daily lotion, will only use baby oil.  Up-to-date on health maintenance.  Smoking status reviewed  Review of Systems Per HPI, also denies recent illness, fever, headache, changes in vision, chest pain, shortness of breath, abdominal pain, N/V/D, weakness   Patient Active Problem List   Diagnosis Date Noted  . Major depressive disorder, recurrent episode, moderate ([place]) 06/18/2018  . Unexplained night sweats 06/18/2018  . Pruritus 06/18/2018  . Open comedone 03/26/2018  . Chronic pain of right knee 11/05/2017  . Back muscle spasm 09/18/2017  . 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 Encounter for routine gynecological examination with Papanicolaou smear of cervix 07/20/2014  . Obstructive sleep apnea 12/26/2011  . Fatigue 09/13/2011  . Prediabetes 07/18/2011  . 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 GAIT DISTURBANCE 05/03/2009  . Allergic rhinitis 09/15/2008  . MENOPAUSAL  SYNDROME 09/05/2008  . Hyperlipemia 06/12/2007  . HYPERTENSION, BENIGN SYSTEMIC 07/17/2006     Objective:  BP 120/80   Pulse 76   Temp 98.9 F (37.2 C) (Oral)   Wt 175 lb (79.4 kg)   SpO2 99%   BMI 31.00 kg/m  Vitals and nursing note reviewed  General: NAD, pleasant, well-appearing HEENT: No anterior/posterior cervical, supraclavicular, axillary  lymphadenopathy palpated. Cardiac: RRR, normal heart sounds, no murmurs Respiratory: CTAB, normal effort Abdomen: soft, nontender, nondistended, normoactive bowel sounds, no hepatosplenomegaly appreciated Extremities: no edema or cyanosis. WWP. Skin: warm and dry, no rashes noted, skin on back dry.  No lesions noted on hands or back.  Neuro: alert and oriented, no focal deficits Psych: Depressed demeanor, however pleasant   Depression screen PHQ 2/9 06/18/2018  Decreased Interest 3  Down, Depressed, Hopeless 3  PHQ - 2 Score 6  Altered sleeping 3  Tired, decreased energy 3  Change in appetite 3  Feeling bad or failure about yourself  3  Trouble concentrating 3  Moving slowly or fidgety/restless 1  Suicidal thoughts 0  PHQ-9 Score 22  Difficult doing work/chores Somewhat difficult  Some recent data might be hidden   GAD 7 : Generalized Anxiety Score 06/18/2018  Nervous, Anxious, on Edge 2  Control/stop worrying 3  Worry too much - different things 3  Trouble relaxing 2  Restless 2  Easily annoyed or irritable 3  Afraid - awful might happen 0  Total GAD 7 Score 15  Anxiety Difficulty Somewhat difficult     Assessment & Plan:   Major depressive disorder, recurrent episode, moderate (HCC) Acute, history of depression several years ago, has not been on any medications.  No current stressors identified.  Will obtain labs to evaluate for an organic cause.  After discussion with patient, decided to move forward with therapy and medication at this time.  Warm handoff provided with Neoma Laming in the office, she discussed therapy with the patient while she was in the office.  Patient will be meeting with Neoma Laming in 2 weeks for further therapy.  Also started citalopram 20 mg today, patient instructed to increase to 40 mg the next 1-2 weeks if tolerating medication.   - Follow-up in 1 month, sooner if needed - Citalopram - Integrative care with Neoma Laming -TSH, CBC --Encourage  decreasing/cessation of trazodone if she is having no benefit with this medication   Unexplained night sweats 1 year history of daily night sweats associated with drenching her clothes, does not need to change sheets, and endorses some fatigue throughout the day.  May be related to room temperature, however would expect this to occur sooner as she is kept her house at this temperature for several years, but did discuss trial of decreasing the temperature.  Initially considered secondary to menopause, however has been in menopause for over 10 years at this point, making this less likely.  Given severity of night sweats and a daily occurrence, with associated fatigue and complaints of pruritus as discussed below, malignancy is on the differential.  Fortunately, denies any fever with a stable weight, no lymphadenopathy/hepatosplenomegaly noted on exam, and up-to-date on all cancer screenings.  No flushing or change in bowel movements to suggest carcinoid syndrome. - Started citalopram as above, may benefit night sweats as well -Trial decreasing room temperature - TSH, CBC, CMP for further evaluation - Can consider imaging in the future if no improvement/lab results  Pruritus On bilateral hands, intermittent areas on back, and flanks.  Likely secondary to  skin process given intermittent areas rather than generalized, dry skin noted on exam, but otherwise no abnormalities noted.  However, differential also including renal/liver/thyroid dysfunction, hematological abnormality, medication side effect, malignancy.  Unlikely infection given duration of symptoms and presentation.  Recommended she use Cetaphil or comparable moisturizing cream daily on her skin.  - Daily moisturizer - Can consider an antihistamine if not helping - Labs as above -- discussed possibly decreasing BB in the future given possible side effect of pruritus and blood pressure very well controlled   Follow-up in 1 month, sooner if  needed  Darrelyn Hillock, DO Family Medicine Resident PGY-1

## 2018-06-18 NOTE — BH Specialist Note (Signed)
Integrated Behavioral Health Initial Visit  MRN: [id number] Name: Kristen [name]  Session Start time: 2:30  Session End time: 3:00 Total time: 30 minutes Type of Service: Integrated Behavioral Health  Warm Hand Off Completed.     Patient guardian verbally consented to meet with [facility]ant about presenting concerns. SUBJECTIVE: Kristen [name] is a 58 y.o. female referred by Dr. Higinio Plan for assistance with managing symptom of depression. Report of symptoms: feeling down, difficult sleeping, change in eating, moody, thoughts racing,  ASSESSMENT: Mood: Euthymic and Affect: Appropriate. Patient is pleasant and engaged in conversation. Reports doing well for the past 10 plus years taking no medication and able to manage minimal symptoms. "I was enjoying life".  Patient is currently experiencing symptoms of anxiety and depression.  Patient may benefit from and is in agreement to receive further assessment and brief therapeutic interventions to assist with managing her symptoms.  GOALS: Patient will: 1. Reduce symptoms of: anxiety and depression 2. Increase knowledge and/or ability of: coping skills and self-management skills  3. Demonstrate ability to: Increase motivation to adhere to plan of care PLAN: 1. Follow up with behavioral health clinician: in 2 weeks 2. F/U phone call from LCSW in 1 week. [phone] 3. 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 Relaxed breathing, healthy eating and relaxation app _______________________________________________________  Duration of CURRENT symptoms:2 to 3 months Age of onset of first mood disturbance:in early 20's Impact on function:does not feeling like doing anything Psychiatric History - Diagnoses:Depression ; sleep apnea  - Hospitalizations:  30 years ago - Pharmacotherapy: started citalopram this office visit - Outpatient therapy: none Family history of psychiatric issues:daughter- depression Risk  of harm to self or others: No plan to harm self or others  LIFE CONTEXT: Family and Social: lives with significant other, 2 daughters and grandchildren live Radiation protection practitioner,  School/Work: receives disability Self-Care: likes to walk and fish Life Changes: none reported  INTERVENTION:  Optician, dispensing, Veterinary surgeon and Supportive Counseling,  Psychoeducation on depression,   PHQ 9=26 and GAD-7=15 (received scores from provider)  Casimer Lanius, [place]   [phone] 4:25 PM

## 2018-06-18 NOTE — Assessment & Plan Note (Addendum)
Acute, history of depression several years ago, has not been on any medications.  No current stressors identified.  Will obtain labs to evaluate for an organic cause.  After discussion with patient, decided to move forward with therapy and medication at this time.  Warm handoff provided with Neoma Laming in the office, she discussed therapy with the patient while she was in the office.  Patient will be meeting with Neoma Laming in 2 weeks for further therapy.  Also started citalopram 20 mg today, patient instructed to increase to 40 mg the next 1-2 weeks if tolerating medication.   - Follow-up in 1 month, sooner if needed - Citalopram - Integrative care with Neoma Laming -TSH, CBC --Encourage decreasing/cessation of trazodone if she is having no benefit with this medication

## 2018-06-18 NOTE — Assessment & Plan Note (Addendum)
On bilateral hands, intermittent areas on back, and flanks.  Likely secondary to skin process given intermittent areas rather than generalized, dry skin noted on exam, but otherwise no abnormalities noted.  However, differential also including renal/liver/thyroid dysfunction, hematological abnormality, medication side effect, malignancy.  Unlikely infection given duration of symptoms and presentation.  Recommended she use Cetaphil or comparable moisturizing cream daily on her skin.  - Daily moisturizer - Can consider an antihistamine if not helping - Labs as above -- discussed possibly decreasing BB in the future given possible side effect of pruritus and blood pressure very well controlled

## 2018-06-18 NOTE — Patient Instructions (Signed)
It was so wonderful meeting you!  For your symptoms of feeling down, ready to start citalopram 20 mg daily, and a week or 2 you may increase to 40 mg if you are tolerating well.  You may also try to take this with a meal to reduce the nausea.  For your night sweats and itching, we get a check some labs today and I will let you know those results soon.  Also try to use Cetaphil cream on your skin daily to keep it from getting too dry.  I would like to see you back in 1 month to follow-up on your symptoms, sooner if needed.

## 2018-06-18 NOTE — Assessment & Plan Note (Signed)
1 year history of daily night sweats associated with drenching her clothes, does not need to change sheets, and endorses some fatigue throughout the day.  May be related to room temperature, however would expect this to occur sooner as she is kept her house at this temperature for several years, but did discuss trial of decreasing the temperature.  Initially considered secondary to menopause, however has been in menopause for over 10 years at this point, making this less likely.  Given severity of night sweats and a daily occurrence, with associated fatigue and complaints of pruritus as discussed below, malignancy is on the differential.  Fortunately, denies any fever with a stable weight, no lymphadenopathy/hepatosplenomegaly noted on exam, and up-to-date on all cancer screenings.  No flushing or change in bowel movements to suggest carcinoid syndrome. - Started citalopram as above, may benefit night sweats as well -Trial decreasing room temperature - TSH, CBC, CMP for further evaluation - Can consider imaging in the future if no improvement/lab results

## 2018-06-19 ENCOUNTER — Encounter: Payer: Self-pay | Admitting: Family Medicine

## 2018-06-19 LAB — COMPREHENSIVE METABOLIC PANEL
ALBUMIN: 3.4 g/dL — AB (ref 3.8–4.9)
ALK PHOS: 98 IU/L (ref 39–117)
ALT: 9 IU/L (ref 0–32)
AST: 24 IU/L (ref 0–40)
Albumin/Globulin Ratio: 1.3 (ref 1.2–2.2)
BILIRUBIN TOTAL: 0.3 mg/dL (ref 0.0–1.2)
BUN / CREAT RATIO: 9 (ref 9–23)
BUN: 8 mg/dL (ref 6–24)
CHLORIDE: 100 mmol/L (ref 96–106)
CO2: 25 mmol/L (ref 20–29)
CREATININE: 0.93 mg/dL (ref 0.57–1.00)
Calcium: 8.7 mg/dL (ref 8.7–10.2)
GFR calc non Af Amer: 69 mL/min/{1.73_m2} (ref >59)
GFR, EST AFRICAN AMERICAN: 79 mL/min/{1.73_m2} (ref >59)
GLOBULIN, TOTAL: 2.7 g/dL (ref 1.5–4.5)
Glucose: 84 mg/dL (ref 65–99)
Potassium: 4.4 mmol/L (ref 3.5–5.2)
SODIUM: 141 mmol/L (ref 134–144)
TOTAL PROTEIN: 6.1 g/dL (ref 6.0–8.5)

## 2018-06-19 LAB — CBC WITH DIFFERENTIAL/PLATELET
BASOS: 1 %
Basophils Absolute: 0.1 10*3/uL (ref 0.0–0.2)
EOS (ABSOLUTE): 0.3 10*3/uL (ref 0.0–0.4)
Eos: 4 %
HEMATOCRIT: 39.2 % (ref 34.0–46.6)
HEMOGLOBIN: 12.9 g/dL (ref 11.1–15.9)
IMMATURE GRANS (ABS): 0 10*3/uL (ref 0.0–0.1)
Immature Granulocytes: 0 %
LYMPHS ABS: 3.3 10*3/uL — AB (ref 0.7–3.1)
LYMPHS: 49 %
MCH: 28.1 pg (ref 26.6–33.0)
MCHC: 32.9 g/dL (ref 31.5–35.7)
MCV: 85 fL (ref 79–97)
MONOCYTES: 8 %
Monocytes Absolute: 0.5 10*3/uL (ref 0.1–0.9)
NEUTROS PCT: 38 %
Neutrophils Absolute: 2.5 10*3/uL (ref 1.4–7.0)
Platelets: 250 10*3/uL (ref 150–450)
RBC: 4.59 x10E6/uL (ref 3.77–5.28)
RDW: 13.5 % (ref 11.7–15.4)
WBC: 6.6 10*3/uL (ref 3.4–10.8)

## 2018-06-19 LAB — TSH: TSH: 1.21 u[IU]/mL (ref 0.450–4.500)

## 2018-06-25 ENCOUNTER — Ambulatory Visit: Payer: Medicare HMO

## 2018-07-02 ENCOUNTER — Telehealth: Payer: Self-pay | Admitting: Licensed Clinical Social Worker

## 2018-07-02 NOTE — Telephone Encounter (Signed)
   Telephone Outreach Note  12/08/5748 Name: Kristen [name] MRN: [id number] DOB: Jun 21, 1960  Referred by: PCP, Dr.Anderson Reason for referral : Appointment (F/U IBH)  LCSW called [name] in reference to missed IBH appointment. Verified demographics. Patient reports she is doing well.  appreciative of F/U call.  Appointment rescheduled 07/07/18 with LCSW.  Casimer Lanius, [facility]   [phone] 2:46 PM

## 2018-07-07 ENCOUNTER — Ambulatory Visit: Payer: Medicare HMO

## 2018-07-14 ENCOUNTER — Ambulatory Visit: Payer: Medicare HMO

## 2018-07-21 ENCOUNTER — Other Ambulatory Visit: Payer: Self-pay | Admitting: Student in an Organized Health Care Education/Training Program

## 2018-07-21 DIAGNOSIS — M62830 Muscle spasm of back: Secondary | ICD-10-CM

## 2018-07-24 ENCOUNTER — Ambulatory Visit: Payer: Medicare HMO | Admitting: Student in an Organized Health Care Education/Training Program

## 2018-09-03 ENCOUNTER — Other Ambulatory Visit: Payer: Self-pay | Admitting: *Deleted

## 2018-09-03 DIAGNOSIS — F329 Major depressive disorder, single episode, unspecified: Secondary | ICD-10-CM

## 2018-09-03 MED ORDER — CITALOPRAM HYDROBROMIDE 20 MG PO TABS: 20.0000 mg | ORAL_TABLET | Freq: Every day | ORAL | 1 refills | Status: DC

## 2018-09-07 ENCOUNTER — Telehealth (INDEPENDENT_AMBULATORY_CARE_PROVIDER_SITE_OTHER): Payer: Medicare HMO | Admitting: Family Medicine

## 2018-09-07 ENCOUNTER — Other Ambulatory Visit: Payer: Self-pay | Admitting: Family Medicine

## 2018-09-07 ENCOUNTER — Other Ambulatory Visit: Payer: Self-pay

## 2018-09-07 DIAGNOSIS — L299 Pruritus, unspecified: Secondary | ICD-10-CM

## 2018-09-07 DIAGNOSIS — R10811 Right upper quadrant abdominal tenderness: Secondary | ICD-10-CM

## 2018-09-07 MED ORDER — TRIAMCINOLONE 0.1 % CREAM:EUCERIN CREAM 1:1: 1.0000 "application " | TOPICAL_CREAM | Freq: Two times a day (BID) | CUTANEOUS | 1 refills | Status: DC

## 2018-09-07 MED ORDER — FEXOFENADINE HCL 180 MG PO TABS: 180.0000 mg | ORAL_TABLET | Freq: Every day | ORAL | 1 refills | Status: DC

## 2018-09-07 MED ORDER — MELOXICAM 7.5 MG PO TABS: 7.5000 mg | ORAL_TABLET | Freq: Every day | ORAL | 0 refills | Status: DC | PRN

## 2018-09-07 NOTE — Assessment & Plan Note (Signed)
??   Perennial allergy. Lab recent lab work to r/o liver pathology and endocrine disorder were normal. Plan to switch to a different H1 antihistamine. D/C Zyrtec. Continue Loratidin. Start Fexofenadine. Start Triamcinolone:Eucerine cream BID. F/U soon if there is no improvement. She verbalized understanding and agreed with the plan. Consider allergist referral in the future.

## 2018-09-07 NOTE — Progress Notes (Signed)
[facility] Telemedicine Visit  Patient consented to have virtual visit. Method of visit: Video was attempted, but technology challenges prevented patient from using video, so visit was conducted via telephone.  Encounter participants: Patient: Kristen [name] - located at home Provider: Andrena Mews - located at office Others (if applicable): N/A  Chief Complaint: Itching  HPI:  Pruritus: C/O Itching on and off x 1 year between her finger, arms, and back. She itches all over, and symptom is worse at night. She informed her PCP, who asked her to get some OTC cream twice a day; this does not help. She tried Benadryl did not help. She is currently suing Zyrtec and Loratidine without any improvement in her symptoms. She denies rash or skin erythema except when she scratches a lot. She denies change in meds, no food allergy.   ROS: per HPI  Pertinent PMHx:Problem list reviewed  Exam:  Respiratory:No resp distress  Assessment/Plan:  Pruritus ?? Perennial allergy. Lab recent lab work to r/o liver pathology and endocrine disorder were normal. Plan to switch to a different H1 antihistamine. D/C Zyrtec. Continue Loratidin. Start Fexofenadine. Start Triamcinolone:Eucerine cream BID. F/U soon if there is no improvement. She verbalized understanding and agreed with the plan. Consider allergist referral in the future.    Time spent during visit with patient: 15 minutes

## 2018-09-08 ENCOUNTER — Telehealth: Payer: Self-pay | Admitting: *Deleted

## 2018-09-08 ENCOUNTER — Telehealth: Payer: Self-pay | Admitting: Family Medicine

## 2018-09-08 MED ORDER — TRIAMCINOLONE ACETONIDE 0.1 % EX CREA: 1.0000 "application " | TOPICAL_CREAM | Freq: Two times a day (BID) | CUTANEOUS | 0 refills | Status: DC

## 2018-09-08 MED ORDER — FEXOFENADINE HCL 180 MG PO TABS: 180.0000 mg | ORAL_TABLET | Freq: Every day | ORAL | 0 refills | Status: DC

## 2018-09-08 NOTE — Telephone Encounter (Signed)
Pt calls to tell us that pharmacy never received the fexofenadine. Upon review the script was "phoned in".  Will resend now. Christen Bame, CMA

## 2018-09-08 NOTE — Telephone Encounter (Signed)
CMA message me that patient stated that she could not get her prescription from the pharmacy.  I indeed called in her Fexofenadine and Triamcinolone:Eucerine to the pharmacy yesterday.  The pharmacist stated that she is not able to get both medications because her insurance will not cover it.  She can get fexofenadine OTC.  I asked them to d/c Triamcinolone combined with Eucerin and I escribed triamcinolone instead.   I will have CMA inform patient.

## 2018-09-08 NOTE — Telephone Encounter (Signed)
Pt informed.  Jessica Fleeger, CMA  

## 2018-09-09 ENCOUNTER — Other Ambulatory Visit: Payer: Self-pay

## 2018-09-09 ENCOUNTER — Encounter (HOSPITAL_COMMUNITY): Payer: Self-pay | Admitting: Emergency Medicine

## 2018-09-09 ENCOUNTER — Ambulatory Visit (HOSPITAL_COMMUNITY)
Admission: EM | Admit: 2018-09-09 | Discharge: 2018-09-09 | Disposition: A | Discharge status: Home / Self Care | Payer: Medicare HMO | Attending: Family Medicine | Admitting: Family Medicine

## 2018-09-09 DIAGNOSIS — R109 Unspecified abdominal pain: Secondary | ICD-10-CM

## 2018-09-09 LAB — POCT URINALYSIS DIP (DEVICE)
Glucose, UA: NEGATIVE mg/dL
Hgb urine dipstick: NEGATIVE
Leukocytes,Ua: NEGATIVE
Nitrite: NEGATIVE
Protein, ur: NEGATIVE mg/dL
Specific Gravity, Urine: >=1.03 (ref 1.005–1.030)
Urobilinogen, UA: 1 mg/dL (ref 0.0–1.0)
pH: 5.5 (ref 5.0–8.0)

## 2018-09-09 MED ORDER — IBUPROFEN 800 MG PO TABS: 800.0000 mg | ORAL_TABLET | Freq: Two times a day (BID) | ORAL | 0 refills | Status: DC | PRN

## 2018-09-09 NOTE — ED Provider Notes (Signed)
[facility]   CC: RT flank pain  SUBJECTIVE:  Kristen [name] is a 58 y.o. female hx significant for HLD, HTN, obesity, peripheral neuropathy, and OSA, who complains of RT flank pain x 3-4 weeks.  Patient denies a precipitating event, recent sexual encounter, excessive caffeine intake.  States she only drinks approximately 2 bottles of water a day.  Also mentions raking the yard prior to symptoms.  Localizes the pain to the RT flank.  Pain is constant and describes it as dull, and radiates towards the RLQ of her abdomen.  Has tried OTC medication including tylenol with minimal relief.  Denies aggravating factors, or association with certain movements.  Denies similar symptoms in the past.  Does admit to associated nausea.  Denies fever, chills, vomiting, abdominal pain, dysuria, increased urinary urgency or frequency, hematuria, changes in bowel habits.    LMP: No LMP recorded. Patient is postmenopausal.  ROS: As in HPI.  Past Medical History:  Diagnosis Date  . Allergy   . Back pain    Seen by Neurosurery  . Hyperlipidemia   . Hypertension   . Leg pain    Bil  . Macromastia   . Obesity   . Peripheral neuropathy   . Post-menopausal   . Sleep apnea    no cpap   Past Surgical History:  Procedure Laterality Date  . BREAST REDUCTION SURGERY Bilateral 02/13/2018   Procedure: MAMMARY REDUCTION  (BREAST);  Surgeon: Irene Limbo, MD;  Location: [facility];  Service: Plastics;  Laterality: Bilateral;  . BREAST SURGERY    . carpel tunnel release Bilateral   . COLONOSCOPY    . LAMINECTOMY  01/20/2008   L4-L5 by Dr Latanya Maudlin (Ortho), [facility]  . POLYPECTOMY    . TUBAL LIGATION     Allergies  Allergen Reactions  . Amoxicillin Hives  . Penicillins Hives, Itching and Rash    Has patient had a PCN reaction causing immediate rash, facial/tongue/throat swelling, SOB or lightheadedness with hypotension: Yes Has patient had a PCN reaction  causing severe rash involving mucus membranes or skin necrosis: No Has patient had a PCN reaction that required hospitalization: No Has patient had a PCN reaction occurring within the last 10 years: No If all of the above answers are "NO", then may proceed with Cephalosporin use.    No current facility-administered medications on file prior to encounter.    Current Outpatient Medications on File Prior to Encounter  Medication Sig Dispense Refill  . acetaminophen (TYLENOL) 325 MG tablet Take 2 tablets (650 mg total) by mouth every 6 (six) hours as needed. 90 tablet 0  . albuterol (PROVENTIL HFA;VENTOLIN HFA) 108 (90 Base) MCG/ACT inhaler Inhale 1-2 puffs into the lungs every 6 (six) hours as needed for wheezing or shortness of breath. 1 Inhaler 3  . carvedilol (COREG) 25 MG tablet Take 1 tablet (25 mg total) by mouth 2 (two) times daily with a meal. 180 tablet 0  . cyclobenzaprine (FLEXERIL) 10 MG tablet TAKE 1 TABLET(10 MG) BY MOUTH AT BEDTIME FOR 14 DAYS 14 tablet 0  . fexofenadine (HM FEXOFENADINE HCL) 180 MG tablet Take 1 tablet (180 mg total) by mouth daily. 30 tablet 0  . fluticasone (FLONASE) 50 MCG/ACT nasal spray Place 1-2 sprays into both nostrils daily. 1 g 2  . gabapentin (NEURONTIN) 300 MG capsule Take 2-3 capsules (600-900 mg total) by mouth 2 (two) times daily. Take 600mg  every afternoon and 900mg  qhs 120 capsule 2  .  loratadine (CLARITIN) 10 MG tablet Take 1 tablet (10 mg total) by mouth daily. 90 tablet 0  . losartan (COZAAR) 50 MG tablet Take 1 tablet (50 mg total) by mouth 2 (two) times daily. 180 tablet 1  . meloxicam (MOBIC) 7.5 MG tablet Take 1 tablet (7.5 mg total) by mouth daily as needed for pain. 30 tablet 0  . sodium chloride (OCEAN) 0.65 % SOLN nasal spray Place 1 spray into both nostrils as needed for congestion. 1 Bottle 0  . traZODone (DESYREL) 50 MG tablet TAKE 1/2 TO 1 TABLET BY MOUTH AT BEDTIME IF NEEDED FOR SLEEP  0  . traZODone (DESYREL) 50 MG tablet Take 1  tablet (50 mg total) by mouth at bedtime. 90 tablet 1  . triamcinolone cream (KENALOG) 0.1 % Apply 1 application topically 2 (two) times daily. 30 g 0   Social History   Socioeconomic History  . Marital status: Single    Spouse name: Not on file  . Number of children: 3  . Years of education: 62  . Highest education level: Some college, no degree  Occupational History  . Occupation: Disabled  Social Needs  . Financial resource strain: Somewhat hard  . Food insecurity:    Worry: Never true    Inability: Never true  . Transportation needs:    Medical: No    Non-medical: No  Tobacco Use  . Smoking status: Former Smoker    Packs/day: 0.25    Years: 0.50    Pack years: 0.12    Types: Cigarettes    Last attempt to quit: 12/19/1990    Years since quitting: 27.7  . Smokeless tobacco: Never Used  . Tobacco comment: Quit 25 years ago.  Substance and Sexual Activity  . Alcohol use: No    Alcohol/week: 0.0 standard drinks    Comment: 05/29/16 none  . Drug use: Yes    Frequency: 4.0 times per week    Types: Marijuana    Comment: last smoked 02-08-18; uses for pain & insomnia  . Sexual activity: Yes    Birth control/protection: Post-menopausal  Lifestyle  . Physical activity:    Days per week: 3 days    Minutes per session: 30 min  . Stress: Only a little  Relationships  . Social connections:    Talks on phone: More than three times a week    Gets together: More than three times a week    Attends religious service: Never    Active member of club or organization: No    Attends meetings of clubs or organizations: Never    Relationship status: Divorced  . Intimate partner violence:    Fear of current or ex partner: No    Emotionally abused: No    Physically abused: No    Forced sexual activity: No  Other Topics Concern  . Not on file  Social History Narrative   Lives at home with her daughter, grandkids (3) and great-grandson.   House is one-level, has 2 steps to get in, no  hand rail. No throw rugs on the floor. No grab bars in bathroom. Has smoke alarms.   Has one dog.   Likes seafood. Eats salads and fruits. Likes to drink juices.   Occasional use of caffeine.   No real hobbies, likes to watch TV.   Family History  Problem Relation Age of Onset  . Lupus Mother   . Heart disease Mother   . Anemia Father   . Hypertension Father   .  Arthritis Brother   . Asthma Son   . Lupus Daughter   . Colon cancer Neg Hx   . Colon polyps Neg Hx   . Esophageal cancer Neg Hx   . Rectal cancer Neg Hx   . Stomach cancer Neg Hx   . Breast cancer Neg Hx     OBJECTIVE:  Vitals:   09/09/18 1458  BP: 131/83  Pulse: 72  Resp: 18  Temp: 98.7 F (37.1 C)  TempSrc: Oral  SpO2: 96%   General appearance: Alert in no acute distress; appears mildly uncomfortable HEENT: NCAT.  Oropharynx clear.  Lungs: clear to auscultation bilaterally without adventitious breath sounds Heart: regular rate and rhythm.  Radial pulses 2+ symmetrical bilaterally Abdomen: soft; non-distended; mild tenderness over RT flank; bowel sounds present; no guarding Back: + mild RT CVA tenderness Extremities: no edema; symmetrical with no gross deformities Skin: warm and dry Neurologic: Ambulates from chair to exam table without difficulty Psychological: alert and cooperative; normal mood and affect  Labs Reviewed  POCT URINALYSIS DIP (DEVICE) - Abnormal; Notable for the following components:      Result Value   Bilirubin Urine SMALL (*)    Ketones, ur TRACE (*)    All other components within normal limits    ASSESSMENT & PLAN:  1. Right flank pain     Meds ordered this encounter  Medications  . ibuprofen (ADVIL) 800 MG tablet    Sig: Take 1 tablet (800 mg total) by mouth 2 (two) times daily as needed for moderate pain.    Dispense:  20 tablet    Refill:  0    Order Specific Question:   Supervising Provider    Answer:   Raylene Everts [[id number]]   Urine showed low specific  gravity, but no blood or signs of infection Urine cultured sent.  We will notify patient of abnormal results  Offered patient further evaluation and management of RT flank pain in the ED.  Patient declines at this time Symptoms consistent with kidney stone or musculoskeletal injury.   Push fluids and get rest Avoid painful activities Ibuprofen prescribed.  Take as directed for pain control.  Avoid taking with other antiinflammatories as this may cause GI upset and/or bleed.  DO NOT TAKE MOBIC OR MELOXICAM, while taking this medication Follow up with PCP if symptoms persist Return or go to ER if you have any new or worsening symptoms (difficulty urinating, blood in urine, pain that does not moderate with medication, fever, chills, abdominal pain, etc...)    Outlined signs and symptoms indicating need for more acute intervention. Patient verbalized understanding. After Visit Summary given.     Lestine Box, PA-C 09/09/18 1536

## 2018-09-09 NOTE — ED Triage Notes (Signed)
Pt sts right flank pain x 3-4 weeks

## 2018-09-09 NOTE — Discharge Instructions (Addendum)
Offered patient further evaluation and management of RT flank pain in the ED.  Patient declines at this time Symptoms consistent with kidney stone or musculoskeletal injury.   Push fluids and get rest Avoid painful activities Ibuprofen prescribed.  Take as directed for pain control.  Avoid taking with other antiinflammatories as this may cause GI upset and/or bleed.  DO NOT TAKE MOBIC OR MELOXICAM, while taking this medication Follow up with PCP if symptoms persist Return or go to ER if you have any new or worsening symptoms (difficulty urinating, blood in urine, pain that does not moderate with medication, fever, chills, abdominal pain, etc...)

## 2018-09-10 LAB — URINE CULTURE: Culture: NO GROWTH

## 2018-09-16 ENCOUNTER — Other Ambulatory Visit: Payer: Self-pay | Admitting: Family Medicine

## 2018-09-16 ENCOUNTER — Other Ambulatory Visit: Payer: Self-pay | Admitting: Emergency Medicine

## 2018-09-16 DIAGNOSIS — Z1231 Encounter for screening mammogram for malignant neoplasm of breast: Secondary | ICD-10-CM

## 2018-10-02 ENCOUNTER — Other Ambulatory Visit: Payer: Self-pay | Admitting: Family Medicine

## 2018-10-02 DIAGNOSIS — R10811 Right upper quadrant abdominal tenderness: Secondary | ICD-10-CM

## 2018-10-05 ENCOUNTER — Other Ambulatory Visit: Payer: Self-pay | Admitting: Student in an Organized Health Care Education/Training Program

## 2018-10-05 DIAGNOSIS — R10811 Right upper quadrant abdominal tenderness: Secondary | ICD-10-CM

## 2018-10-07 ENCOUNTER — Encounter: Payer: Self-pay | Admitting: Student in an Organized Health Care Education/Training Program

## 2018-10-07 ENCOUNTER — Ambulatory Visit (INDEPENDENT_AMBULATORY_CARE_PROVIDER_SITE_OTHER): Payer: Medicare HMO | Admitting: Student in an Organized Health Care Education/Training Program

## 2018-10-07 ENCOUNTER — Other Ambulatory Visit: Payer: Self-pay

## 2018-10-07 VITALS — BP 141/82 | HR 67 | Wt 163.0 lb

## 2018-10-07 DIAGNOSIS — R109 Unspecified abdominal pain: Secondary | ICD-10-CM | POA: Diagnosis not present

## 2018-10-07 DIAGNOSIS — R1011 Right upper quadrant pain: Secondary | ICD-10-CM | POA: Diagnosis not present

## 2018-10-07 DIAGNOSIS — R634 Abnormal weight loss: Secondary | ICD-10-CM

## 2018-10-07 MED ORDER — IBUPROFEN 600 MG PO TABS: 600.0000 mg | ORAL_TABLET | Freq: Three times a day (TID) | ORAL | 0 refills | Status: DC | PRN

## 2018-10-07 NOTE — Progress Notes (Signed)
   Subjective:    Patient ID: Kristen [name], female    DOB: 1960-08-20, 58 y.o.   MRN: [id number]   CC: abdominal/flank pain x1 month  HPI:  Patient endorses acute onset R flank pain that initiated while walking about 1 month ago that has been constant in it's presence but fluctuating in severity. The pain does not radiate. Not exacerbated by movement or eating. She has bowel movements 1-2 times every day and they have been normal. Denies any blood or black stools. Endorses night sweats. Patient has had a decreased appetite and early satiety for several years which is evidenced by a marked weight loss without intentional dieting. She states that she vomits frequently after eating in the mornings and then feels fine. No nausea precipitating. She believes that she has had increased urinary frequency but no dysuria or blood/dark colored urine.  She was evaluated in ED for this pain and was prescribed ibuprofen which she states helps minimally. Urinalysis at that time was negative infection.    Smoking status reviewed   ROS: pertinent noted in the HPI   Past Medical History:  Diagnosis Date  . Allergy   . Back pain    Seen by Neurosurery  . Hyperlipidemia   . Hypertension   . Leg pain    Bil  . Macromastia   . Obesity   . Peripheral neuropathy   . Post-menopausal   . Sleep apnea    no cpap    Past Surgical History:  Procedure Laterality Date  . BREAST REDUCTION SURGERY Bilateral 02/13/2018   Procedure: MAMMARY REDUCTION  (BREAST);  Surgeon: Irene Limbo, MD;  Location: [facility];  Service: Plastics;  Laterality: Bilateral;  . BREAST SURGERY    . carpel tunnel release Bilateral   . COLONOSCOPY    . LAMINECTOMY  01/20/2008   L4-L5 by Dr Latanya Maudlin (Ortho), [facility]  . POLYPECTOMY    . TUBAL LIGATION      Past medical history, surgical, family, and social history reviewed and updated in the EMR as appropriate.  Objective:  BP (!)  141/82   Pulse 67   Wt 163 lb (73.9 kg)   SpO2 96%   BMI 28.87 kg/m   Vitals and nursing note reviewed  General: NAD, pleasant, able to participate in exam Cardiac: RRR, S1 S2 present. normal heart sounds, no murmurs. Respiratory: CTAB, normal effort, No wheezes, rales or rhonchi Abdomen: flat, soft. Positive murpheys sign. Positive rebound tenderness in bilateral upper quadrants. Negative guarding. Negative splenohepatomegaly. No rashes or lesions. Negative CVA tenderness. Extremities: no edema or cyanosis. Skin: warm and dry, no rashes noted Neuro: alert, no obvious focal deficits Psych: Normal affect and mood   Assessment & Plan:    Abdominal pain Flank pain- has been seen by multiple providers with no resolution in symptoms.  - unknown cause but with significant weight loss, night sweats- worried about malignancy - ordered abdomen CT w wo contrast - also considering anemia- ordered CBC - CMP- checking liver enzymes, electrolytes - ESR- if >80, will have increased suspicion for immunologic etiology    Doristine Mango, [facility] PGY-1

## 2018-10-07 NOTE — Patient Instructions (Signed)
It was a pleasure to see you today!  To summarize our discussion for this visit:  We are going to investigate why you are having this side pain. I've ordered some blood work and a CT scan of your abdomen. I will follow up with you when I get the results back for those tests.  In the mean time, I will prescribe you some ibuprofen to help with the pain  Call the clinic at [phone] if your symptoms worsen or you have any concerns.  Thank you for allowing me to take part in your care,  Dr. Doristine Mango   Thanks for choosing [facility] for your primary care.

## 2018-10-07 NOTE — Assessment & Plan Note (Signed)
Flank pain- has been seen by multiple providers with no resolution in symptoms.  - unknown cause but with significant weight loss, night sweats- worried about malignancy - ordered abdomen CT w wo contrast - also considering anemia- ordered CBC - CMP- checking liver enzymes, electrolytes - ESR- if >80, will have increased suspicion for immunologic etiology

## 2018-10-08 LAB — COMPREHENSIVE METABOLIC PANEL
ALT: 12 IU/L (ref 0–32)
AST: 19 IU/L (ref 0–40)
Albumin/Globulin Ratio: 1.5 (ref 1.2–2.2)
Albumin: 4 g/dL (ref 3.8–4.9)
Alkaline Phosphatase: 96 IU/L (ref 39–117)
BUN/Creatinine Ratio: 8 — ABNORMAL LOW (ref 9–23)
BUN: 9 mg/dL (ref 6–24)
Bilirubin Total: 0.4 mg/dL (ref 0.0–1.2)
CO2: 26 mmol/L (ref 20–29)
Calcium: 9.7 mg/dL (ref 8.7–10.2)
Chloride: 103 mmol/L (ref 96–106)
Creatinine, Ser: 1.07 mg/dL — ABNORMAL HIGH (ref 0.57–1.00)
GFR calc Af Amer: 67 mL/min/{1.73_m2} (ref >59)
GFR calc non Af Amer: 58 mL/min/{1.73_m2} — ABNORMAL LOW (ref >59)
Globulin, Total: 2.7 g/dL (ref 1.5–4.5)
Glucose: 91 mg/dL (ref 65–99)
Potassium: 4.3 mmol/L (ref 3.5–5.2)
Sodium: 142 mmol/L (ref 134–144)
Total Protein: 6.7 g/dL (ref 6.0–8.5)

## 2018-10-08 LAB — CBC WITH DIFFERENTIAL/PLATELET
Basophils Absolute: 0.1 10*3/uL (ref 0.0–0.2)
Basos: 1 %
EOS (ABSOLUTE): 0.2 10*3/uL (ref 0.0–0.4)
Eos: 2 %
Hematocrit: 41.8 % (ref 34.0–46.6)
Hemoglobin: 13.4 g/dL (ref 11.1–15.9)
Immature Grans (Abs): 0 10*3/uL (ref 0.0–0.1)
Immature Granulocytes: 0 %
Lymphocytes Absolute: 2.1 10*3/uL (ref 0.7–3.1)
Lymphs: 31 %
MCH: 27.2 pg (ref 26.6–33.0)
MCHC: 32.1 g/dL (ref 31.5–35.7)
MCV: 85 fL (ref 79–97)
Monocytes Absolute: 0.7 10*3/uL (ref 0.1–0.9)
Monocytes: 10 %
Neutrophils Absolute: 3.7 10*3/uL (ref 1.4–7.0)
Neutrophils: 56 %
Platelets: 254 10*3/uL (ref 150–450)
RBC: 4.92 x10E6/uL (ref 3.77–5.28)
RDW: 13.5 % (ref 11.7–15.4)
WBC: 6.7 10*3/uL (ref 3.4–10.8)

## 2018-10-08 LAB — SEDIMENTATION RATE: Sed Rate: 26 mm/hr (ref 0–40)

## 2018-10-14 ENCOUNTER — Ambulatory Visit
Admission: RE | Admit: 2018-10-14 | Discharge: 2018-10-14 | Disposition: A | Discharge status: Home / Self Care | Payer: Medicare HMO | Source: Ambulatory Visit | Attending: Family Medicine | Admitting: Family Medicine

## 2018-10-14 DIAGNOSIS — R634 Abnormal weight loss: Secondary | ICD-10-CM

## 2018-10-14 DIAGNOSIS — K5730 Diverticulosis of large intestine without perforation or abscess without bleeding: Secondary | ICD-10-CM | POA: Diagnosis not present

## 2018-10-14 MED ORDER — IOPAMIDOL (ISOVUE-300) INJECTION 61%
100.0000 mL | Freq: Once | INTRAVENOUS | Status: AC | PRN
  Administered 2018-10-14: 15:00:00 100 mL via INTRAVENOUS

## 2018-10-19 ENCOUNTER — Telehealth: Payer: Self-pay | Admitting: *Deleted

## 2018-10-19 NOTE — Telephone Encounter (Signed)
Pt is requesting the results of her CT scan as she is still having pain. Christen Bame, CMA

## 2018-10-26 NOTE — Telephone Encounter (Signed)
Please let patient know that her labs and CT were negative for any specific cause of her symptoms. She may have passed a gallstone. If she would like to know more details we can schedule an appointment to go over the results.

## 2018-11-02 NOTE — Telephone Encounter (Signed)
Pt informed and scheduled an appt to see you Friday. Alijah Hyde Kennon Holter, CMA

## 2018-11-06 ENCOUNTER — Other Ambulatory Visit: Payer: Self-pay

## 2018-11-06 ENCOUNTER — Encounter: Payer: Self-pay | Admitting: Student in an Organized Health Care Education/Training Program

## 2018-11-06 ENCOUNTER — Ambulatory Visit (INDEPENDENT_AMBULATORY_CARE_PROVIDER_SITE_OTHER): Payer: Medicare HMO | Admitting: Student in an Organized Health Care Education/Training Program

## 2018-11-06 VITALS — BP 135/80 | HR 100 | Temp 98.1°F | Wt 164.0 lb

## 2018-11-06 DIAGNOSIS — F5104 Psychophysiologic insomnia: Secondary | ICD-10-CM | POA: Diagnosis not present

## 2018-11-06 DIAGNOSIS — R109 Unspecified abdominal pain: Secondary | ICD-10-CM | POA: Diagnosis not present

## 2018-11-06 DIAGNOSIS — F331 Major depressive disorder, recurrent, moderate: Secondary | ICD-10-CM | POA: Diagnosis not present

## 2018-11-06 DIAGNOSIS — R1011 Right upper quadrant pain: Secondary | ICD-10-CM

## 2018-11-06 MED ORDER — TRAZODONE HCL 50 MG PO TABS: 50.0000 mg | ORAL_TABLET | Freq: Every day | ORAL | 1 refills | Status: DC

## 2018-11-06 NOTE — Progress Notes (Signed)
   Subjective:    Patient ID: Kristen [name], female    DOB: February 09, 1961, 58 y.o.   MRN: [id number]   CC: results review  HPI: Patient returns today to discuss results from previous bloodwork and CT. Both were relatively normal. CT did show a gallstone without overt inflammation of gallbladder and diverticular disease, again without inflammation.   Patient continues to have flank pain without change from previous visit. She has not lost any weight- in fact, she gained a pound this visit. She has been eating her normal diet. Normal stool pattern without diarrhea or constipation. Denies N/V. She appears happy today.   Smoking status reviewed   ROS: pertinent noted in the HPI   Past medical history, surgical, family, and social history reviewed and updated in the EMR as appropriate.  Objective:  BP 135/80   Pulse 100   Temp 98.1 F (36.7 C) (Oral)   Wt 164 lb (74.4 kg)   SpO2 95%   BMI 29.05 kg/m   Vitals and nursing note reviewed  General: NAD, pleasant, able to participate in exam Abdomen: obese, soft. Tender to palpation of RUQ. Negative murphy sign. Extremities: no edema or cyanosis. Skin: warm and dry, no rashes noted Neuro: alert, no obvious focal deficits Psych: Normal affect and mood   Assessment & Plan:    Abdominal pain Abdominal pain continues. CT negative for acute cause. Positive for non-obstructing gallstone with dilation of pancreatic duct, I suspect possible passage of stone that was causing pain and continued discomfort from cholelithiasis.  Continue ibuprofen PRN Referral to general surgery to discuss possible cholecystectomy   Refilled trazodone   Doristine Mango, [facility] PGY-1

## 2018-11-06 NOTE — Assessment & Plan Note (Signed)
Abdominal pain continues. CT negative for acute cause. Positive for non-obstructing gallstone with dilation of pancreatic duct, I suspect possible passage of stone that was causing pain and continued discomfort from cholelithiasis.  Continue ibuprofen PRN Referral to general surgery to discuss possible cholecystectomy

## 2018-11-06 NOTE — Patient Instructions (Signed)
It was a pleasure to see you today!  To summarize our discussion for this visit:  I am going to refill your trazodone  Your CT showed a gallstone in your gallbladder and you are still having that pain so I am going to refer you to general surgery to discuss having it removed.  Call the clinic at [phone] if your symptoms worsen or you have any concerns.  Thank you for allowing me to take part in your care,  Dr. Doristine Mango   Thanks for choosing [facility] for your primary care.

## 2018-11-11 ENCOUNTER — Ambulatory Visit
Admission: RE | Admit: 2018-11-11 | Discharge: 2018-11-11 | Disposition: A | Discharge status: Home / Self Care | Payer: Medicare HMO | Source: Ambulatory Visit | Attending: Family Medicine | Admitting: Family Medicine

## 2018-11-11 DIAGNOSIS — Z1231 Encounter for screening mammogram for malignant neoplasm of breast: Secondary | ICD-10-CM

## 2018-11-18 DIAGNOSIS — H18413 Arcus senilis, bilateral: Secondary | ICD-10-CM | POA: Diagnosis not present

## 2018-11-18 DIAGNOSIS — H25013 Cortical age-related cataract, bilateral: Secondary | ICD-10-CM | POA: Diagnosis not present

## 2018-11-18 DIAGNOSIS — H40013 Open angle with borderline findings, low risk, bilateral: Secondary | ICD-10-CM | POA: Diagnosis not present

## 2018-11-18 DIAGNOSIS — H2513 Age-related nuclear cataract, bilateral: Secondary | ICD-10-CM | POA: Diagnosis not present

## 2018-11-18 DIAGNOSIS — H40033 Anatomical narrow angle, bilateral: Secondary | ICD-10-CM | POA: Diagnosis not present

## 2018-12-29 ENCOUNTER — Other Ambulatory Visit: Payer: Self-pay | Admitting: *Deleted

## 2018-12-29 DIAGNOSIS — I10 Essential (primary) hypertension: Secondary | ICD-10-CM

## 2019-01-01 MED ORDER — CARVEDILOL 25 MG PO TABS: 25.0000 mg | ORAL_TABLET | Freq: Two times a day (BID) | ORAL | 3 refills | Status: DC

## 2019-01-22 ENCOUNTER — Ambulatory Visit (INDEPENDENT_AMBULATORY_CARE_PROVIDER_SITE_OTHER): Payer: Medicare HMO | Admitting: Student in an Organized Health Care Education/Training Program

## 2019-01-22 ENCOUNTER — Other Ambulatory Visit: Payer: Self-pay

## 2019-01-22 VITALS — BP 122/80 | HR 93 | Wt 160.2 lb

## 2019-01-22 DIAGNOSIS — R1013 Epigastric pain: Secondary | ICD-10-CM | POA: Diagnosis not present

## 2019-01-22 DIAGNOSIS — K219 Gastro-esophageal reflux disease without esophagitis: Secondary | ICD-10-CM

## 2019-01-22 DIAGNOSIS — Z23 Encounter for immunization: Secondary | ICD-10-CM

## 2019-01-22 MED ORDER — FAMOTIDINE 10 MG PO TABS: 10.0000 mg | ORAL_TABLET | Freq: Two times a day (BID) | ORAL | 0 refills | Status: DC

## 2019-01-22 NOTE — Assessment & Plan Note (Addendum)
Broad differential-  GERD/peptic ulcer- pain has now migrated to epigastric location. History positive for pain worse with acidic foods such as tomatoes. Also taking excessive NSAIDs  - obtaining a urea breath test today- pending  - advised to decrease NSAID use as tolerated and avoidance of triggering foods which patient was already doing  - prescribe H2 blocker (famotidine 10mg  BID and may increase dose) at this time and consider PPI after H pylori workup  - referring to GI- requesting EGD to evaluate refractory abdominal pain and possible biopsy- H.pylori  Gastroparesis- more likely now at this time due to patient description of vomiting without nausea after eating every day and having weight loss. Active bowel sounds were heard on exam. Glucose was normal in most recent labs. A1c was 6.0 two years ago.  - would like to pursue a swallow study pending GI workup. Patient is agreeable to this plan.  - repeat A1c at next appointment  Gallbladder disease- prior exam had RUQ pain and history of exacerbated pain after eating greasy foods. CT showed small stone in gallbladder. Less likely on differential at this time.  - consider pursuing general surgery referral pending what is found with GI referral and recommendations  Pancreatitis- less likely as the CT did not describe inflammation of the pancrease but there was a prominent pancreatic duct which could be associated with obstruction that had passed spontaneously. Would have expected pancreatitis to resolve by now if was caused by a passed gallstone.   - obtaining lipase at this visit. Which is wnl

## 2019-01-22 NOTE — Progress Notes (Signed)
Subjective:    Patient ID: Kristen [name], female    DOB: 05/23/1960, 58 y.o.   MRN: [id number]    CC: refractory abdominal pain  HPI:  Patient presents for follow up of abdominal pain which is getting progressively worse. The severity or character have not changed, but the frequency has increased to several times per day and the location has migrated from the flank to more epigastric area. Her abdominal CT scan on 5/27 showed no acute evidence of intra-abdominal or pelvic abnormality but showed small stone or polyp in the gallbladder fundus, prominent pancreatic duct without discrete mass or inflammatory change and non-inflammatory sigmoid diverticular disease. Patient states that she is having epigastric pain several times per day which is exacerbated by eating food so she has been decreasing the amount she has been eating. She has noticed that she has lost weight. She does not know why her weight has been fluctuating. She states that she will daily have an episode of vomiting after eating but has no nausea. Denies any blood in her vomit or stool. Denies black/tarry stools. Denies having burning pain that moves up her esophagus to mouth or burping. She does have the symptoms while laying flat at night.  Denies sensation of having food stuck in throat or difficulties with swallowing. She states that she takes 600mg  ibuprofen up to 4 times every day.  I had referred her to general surgery at our last visit but patient states that she has not heard anything about that referral.   Smoking status reviewed   ROS: pertinent noted in the HPI   Past medical history, surgical, family, and social history reviewed and updated in the EMR as appropriate.  Objective:  BP 122/80   Pulse 93   Wt 160 lb 3.2 oz (72.7 kg)   SpO2 98%   BMI 28.38 kg/m   Vitals and nursing note reviewed  General: NAD, pleasant, able to participate in exam Abdomen: Negative for observational changes.  Abdomen is soft  diffusely.  Active bowel sounds present diffusely.  When deep palpation to left lower quadrant produced sharp pain to epigastric area.  Palpation of right upper quadrant and epigastric area both produced similar pain in the epigastric area.  Patient had negative Murphy sign. Extremities: no edema or cyanosis. Skin: warm and dry, no rashes noted Neuro: alert, no obvious focal deficits Psych: Normal affect and mood   Assessment & Plan:   Epigastric pain Broad differential-  GERD/peptic ulcer- pain has now migrated to epigastric location. History positive for pain worse with acidic foods such as tomatoes. Also taking excessive NSAIDs  - obtaining a urea breath test today- pending  - advised to decrease NSAID use as tolerated and avoidance of triggering foods which patient was already doing  - prescribe H2 blocker (famotidine 10mg  BID and may increase dose) at this time and consider PPI after H pylori workup  - referring to GI- requesting EGD to evaluate refractory abdominal pain and possible biopsy- H.pylori  Gastroparesis- more likely now at this time due to patient description of vomiting without nausea after eating every day and having weight loss. Active bowel sounds were heard on exam. Glucose was normal in most recent labs. A1c was 6.0 two years ago.  - would like to pursue a swallow study pending GI workup. Patient is agreeable to this plan.  - repeat A1c at next appointment  Gallbladder disease- prior exam had RUQ pain and history of exacerbated pain after eating greasy foods.  CT showed small stone in gallbladder. Less likely on differential at this time.  - consider pursuing general surgery referral pending what is found with GI referral and recommendations  Pancreatitis- less likely as the CT did not describe inflammation of the pancrease but there was a prominent pancreatic duct which could be associated with obstruction that had passed spontaneously. Would have expected pancreatitis  to resolve by now if was caused by a passed gallstone.   - obtaining lipase at this visit. Which is wnl   Doristine Mango, [facility] PGY-2

## 2019-01-22 NOTE — Patient Instructions (Addendum)
It was a pleasure to see you today!  To summarize our discussion for this visit:  I'm sorry to hear that you are still having belly pain and it is getting worse.  I am going to follow up and see what happened with the surgery referral.   Your pain could be due to gallstone disease, really bad acid reflux that caused ulcers, or that other digestive issue we talked about called gastroparesis.   I will refer you to get a swallow study and/or scope of your stomach to see if you have ulcers.   In the meantime, I would try to decrease the amount of ibuprofen you are taking as it can contribute to ulcers. I can send in a prescription for another medication to help with that if that is the cause and we can test your breath for a certain bacteria that can also contribute to that ulcer disease  Some additional health maintenance measures we should update are: Health Maintenance Due  Topic Date Due  . INFLUENZA VACCINE  12/19/2018  . I'm proud of you for getting your flu shot today!   Please return to our clinic to see me in a few weeks once you have seen the GI doctor.  Call the clinic at [phone] if your symptoms worsen or you have any concerns.   Thank you for allowing me to take part in your care,  Dr. Doristine Mango

## 2019-01-23 LAB — LIPASE: Lipase: 38 U/L (ref 14–72)

## 2019-01-24 LAB — H. PYLORI BREATH TEST: H pylori Breath Test: POSITIVE — AB

## 2019-01-26 ENCOUNTER — Other Ambulatory Visit: Payer: Self-pay | Admitting: Student in an Organized Health Care Education/Training Program

## 2019-01-26 MED ORDER — METRONIDAZOLE 250 MG PO TABS: 250.0000 mg | ORAL_TABLET | Freq: Four times a day (QID) | ORAL | 0 refills | Status: AC

## 2019-01-26 MED ORDER — BISMUTH SUBSALICYLATE 262 MG PO CHEW: 524.0000 mg | CHEWABLE_TABLET | Freq: Three times a day (TID) | ORAL | 0 refills | Status: AC

## 2019-01-26 MED ORDER — CLARITHROMYCIN 500 MG PO TABS: 500.0000 mg | ORAL_TABLET | Freq: Two times a day (BID) | ORAL | 0 refills | Status: AC

## 2019-01-26 MED ORDER — PANTOPRAZOLE SODIUM 40 MG PO TBEC: 40.0000 mg | DELAYED_RELEASE_TABLET | Freq: Two times a day (BID) | ORAL | 0 refills | Status: DC

## 2019-01-26 NOTE — Progress Notes (Signed)
Patient urea breath test positive for H. Pylori. Patient has documented allergy to penicillin and amoxicillin with metronidazole use in past few years. Called and LVM with patient that I am sending in treatment 14 days Pantoprazole 40mg  BID Metronidazole 250mg  QID Clarithromycin 500mg  BID Bismuth subsalicylate 524mg  QID with meals and at bedtime  Return for test of cure in 6 weeks.

## 2019-02-26 ENCOUNTER — Other Ambulatory Visit: Payer: Self-pay

## 2019-02-26 ENCOUNTER — Encounter: Payer: Self-pay | Admitting: Gastroenterology

## 2019-02-26 ENCOUNTER — Ambulatory Visit (INDEPENDENT_AMBULATORY_CARE_PROVIDER_SITE_OTHER): Payer: Medicare HMO | Admitting: Gastroenterology

## 2019-02-26 VITALS — BP 106/70 | HR 88 | Temp 98.5°F | Ht 62.0 in | Wt 161.1 lb

## 2019-02-26 DIAGNOSIS — R1013 Epigastric pain: Secondary | ICD-10-CM

## 2019-02-26 DIAGNOSIS — R6881 Early satiety: Secondary | ICD-10-CM | POA: Diagnosis not present

## 2019-02-26 DIAGNOSIS — R634 Abnormal weight loss: Secondary | ICD-10-CM

## 2019-02-26 DIAGNOSIS — R112 Nausea with vomiting, unspecified: Secondary | ICD-10-CM | POA: Diagnosis not present

## 2019-02-26 MED ORDER — OMEPRAZOLE 40 MG PO CPDR: 40.0000 mg | DELAYED_RELEASE_CAPSULE | Freq: Every day | ORAL | 1 refills | Status: DC

## 2019-02-26 NOTE — Patient Instructions (Addendum)
If you are age 57 or older, your body mass index should be between 23-30. Your Body mass index is 29.47 kg/m. If this is out of the aforementioned range listed, please consider follow up with your Primary Care Provider.  If you are age 80 or younger, your body mass index should be between 19-25. Your Body mass index is 29.47 kg/m. If this is out of the aformentioned range listed, please consider follow up with your Primary Care Provider.   You have been scheduled for an endoscopy. Please follow written instructions given to you at your visit today. If you use inhalers (even only as needed), please bring them with you on the day of your procedure.  It was a pleasure to see you today!  Dr. Loletha Carrow

## 2019-02-26 NOTE — Progress Notes (Signed)
[facility] Consult Note:  History: Kristen [name] [id number]  Referring provider: Richarda Osmond, DO  Reason for consult/chief complaint: Gastroesophageal Reflux and nausea and vomiting   Subjective  HPI:  Kristen [name] was referred back by her primary care provider for abdominal pain and weight loss.  It has been going on at least 6 months, and she describes an upper abdominal pain, nausea and early satiety.  Pain often occurs after meals, and sometimes associated with nausea and vomiting. Alarmingly, she also believes she has lost about 90 pounds this year.  However, a primary care office note dated May 20 shows her weight at 163 pounds, she is 161 on our scale today.  Weight was 175 pounds on a primary care note dated January 30  Bowel habits are regular without rectal bleeding.  Appetite is poor.  She takes Mobic daily, and unfortunately has also been taking ibuprofen several times a day for this pain.  Fully she is saw me for a surveillance colonoscopy in August 2017, one small adenomatous and 1 small hyperplastic polyp of the hepatic flexure were found.  ROS:  Review of Systems  Constitutional: Negative for appetite change and unexpected weight change.  HENT: Negative for mouth sores and voice change.   Eyes: Negative for pain and redness.  Respiratory: Negative for cough and shortness of breath.   Cardiovascular: Negative for chest pain and palpitations.  Genitourinary: Negative for dysuria and hematuria.  Musculoskeletal: Negative for arthralgias and myalgias.  Skin: Negative for pallor and rash.  Neurological: Negative for weakness and headaches.  Hematological: Negative for adenopathy.  Psychiatric/Behavioral:       Depression     Past Medical History: Past Medical History:  Diagnosis Date  . Allergy   . Back pain    Seen by Neurosurery  . Hyperlipidemia   . Hypertension   . Leg pain    Bil  . Macromastia   . Obesity   .  Peripheral neuropathy   . Post-menopausal   . Sleep apnea    no cpap     Past Surgical History: Past Surgical History:  Procedure Laterality Date  . BREAST REDUCTION SURGERY Bilateral 02/13/2018   Procedure: MAMMARY REDUCTION  (BREAST);  Surgeon: Irene Limbo, MD;  Location: [facility];  Service: Plastics;  Laterality: Bilateral;  . BREAST SURGERY    . carpel tunnel release Bilateral   . COLONOSCOPY    . LAMINECTOMY  01/20/2008   L4-L5 by Dr Latanya Maudlin (Ortho), [facility]  . POLYPECTOMY    . REDUCTION MAMMAPLASTY Bilateral 01/2018  . TUBAL LIGATION       Family History: Family History  Problem Relation Age of Onset  . Lupus Mother   . Heart disease Mother   . Anemia Father   . Hypertension Father   . Arthritis Brother   . Asthma Son   . Lupus Daughter   . Colon cancer Neg Hx   . Colon polyps Neg Hx   . Esophageal cancer Neg Hx   . Rectal cancer Neg Hx   . Stomach cancer Neg Hx   . Breast cancer Neg Hx     Social History: Social History   Socioeconomic History  . Marital status: Single    Spouse name: Not on file  . Number of children: 3  . Years of education: 77  . Highest education level: Some college, no degree  Occupational History  . Occupation: Disabled  Social Needs  . Emergency planning/management officer  strain: Somewhat hard  . Food insecurity    Worry: Never true    Inability: Never true  . Transportation needs    Medical: No    Non-medical: No  Tobacco Use  . Smoking status: Former Smoker    Packs/day: 0.25    Years: 0.50    Pack years: 0.12    Types: Cigarettes    Quit date: 12/19/1990    Years since quitting: 28.2  . Smokeless tobacco: Never Used  . Tobacco comment: Quit 25 years ago.  Substance and Sexual Activity  . Alcohol use: No    Alcohol/week: 0.0 standard drinks    Comment: 05/29/16 none  . Drug use: Yes    Frequency: 4.0 times per week    Types: Marijuana    Comment: last smoked 02-08-18; uses for pain &  insomnia  . Sexual activity: Yes    Birth control/protection: Post-menopausal  Lifestyle  . Physical activity    Days per week: 3 days    Minutes per session: 30 min  . Stress: Only a little  Relationships  . Social connections    Talks on phone: More than three times a week    Gets together: More than three times a week    Attends religious service: Never    Active member of club or organization: No    Attends meetings of clubs or organizations: Never    Relationship status: Divorced  Other Topics Concern  . Not on file  Social History Narrative   Lives at home with her daughter, grandkids (3) and great-grandson.   House is one-level, has 2 steps to get in, no hand rail. No throw rugs on the floor. No grab bars in bathroom. Has smoke alarms.   Has one dog.   Likes seafood. Eats salads and fruits. Likes to drink juices.   Occasional use of caffeine.   No real hobbies, likes to watch TV.    Allergies: Allergies  Allergen Reactions  . Amoxicillin Hives  . Penicillins Hives, Itching and Rash    Has patient had a PCN reaction causing immediate rash, facial/tongue/throat swelling, SOB or lightheadedness with hypotension: Yes Has patient had a PCN reaction causing severe rash involving mucus membranes or skin necrosis: No Has patient had a PCN reaction that required hospitalization: No Has patient had a PCN reaction occurring within the last 10 years: No If all of the above answers are "NO", then may proceed with Cephalosporin use.     Outpatient Meds: Current Outpatient Medications  Medication Sig Dispense Refill  . acetaminophen (TYLENOL) 325 MG tablet Take 2 tablets (650 mg total) by mouth every 6 (six) hours as needed. 90 tablet 0  . albuterol (PROVENTIL HFA;VENTOLIN HFA) 108 (90 Base) MCG/ACT inhaler Inhale 1-2 puffs into the lungs every 6 (six) hours as needed for wheezing or shortness of breath. 1 Inhaler 3  . carvedilol (COREG) 25 MG tablet Take 1 tablet (25 mg total)  by mouth 2 (two) times daily with a meal. 180 tablet 3  . cyclobenzaprine (FLEXERIL) 10 MG tablet TAKE 1 TABLET(10 MG) BY MOUTH AT BEDTIME FOR 14 DAYS 14 tablet 0  . famotidine (PEPCID) 10 MG tablet Take 1 tablet (10 mg total) by mouth 2 (two) times daily. 60 tablet 0  . fexofenadine (HM FEXOFENADINE HCL) 180 MG tablet Take 1 tablet (180 mg total) by mouth daily. 30 tablet 0  . fluticasone (FLONASE) 50 MCG/ACT nasal spray Place 1-2 sprays into both nostrils daily. 1 g 2  .  gabapentin (NEURONTIN) 300 MG capsule Take 2-3 capsules (600-900 mg total) by mouth 2 (two) times daily. Take 600mg  every afternoon and 900mg  qhs 120 capsule 2  . ibuprofen (ADVIL) 600 MG tablet Take 1 tablet (600 mg total) by mouth every 8 (eight) hours as needed. 90 tablet 0  . ibuprofen (ADVIL) 800 MG tablet Take 1 tablet (800 mg total) by mouth 2 (two) times daily as needed for moderate pain. 20 tablet 0  . loratadine (CLARITIN) 10 MG tablet Take 1 tablet (10 mg total) by mouth daily. 90 tablet 0  . losartan (COZAAR) 50 MG tablet Take 1 tablet (50 mg total) by mouth 2 (two) times daily. 180 tablet 1  . meloxicam (MOBIC) 7.5 MG tablet TAKE 1 TABLET(7.5 MG) BY MOUTH DAILY AS NEEDED FOR PAIN 30 tablet 0  . sodium chloride (OCEAN) 0.65 % SOLN nasal spray Place 1 spray into both nostrils as needed for congestion. 1 Bottle 0  . traZODone (DESYREL) 50 MG tablet Take 1 tablet (50 mg total) by mouth at bedtime. 90 tablet 1  . triamcinolone cream (KENALOG) 0.1 % Apply 1 application topically 2 (two) times daily. 30 g 0  . omeprazole (PRILOSEC) 40 MG capsule Take 1 capsule (40 mg total) by mouth daily. 30 capsule 1  . pantoprazole (PROTONIX) 40 MG tablet Take 1 tablet (40 mg total) by mouth 2 (two) times daily for 14 days. 28 tablet 0   No current facility-administered medications for this visit.    No longer on Protonix   ___________________________________________________________________ Objective   Exam:  BP 106/70 (BP  Location: Left Arm, Patient Position: Sitting, Cuff Size: Normal)   Pulse 88   Temp 98.5 F (36.9 C)   Ht 5\' 2"  (1.575 m) Comment: height measured without shoes  Wt 161 lb 2 oz (73.1 kg)   BMI 29.47 kg/m    General: Pleasant and conversational, restricted affect.  Face is thin, skin loose arms and abdominal wall  Eyes: sclera anicteric, no redness  ENT: oral mucosa moist without lesions, no cervical or supraclavicular lymphadenopathy  CV: RRR without murmur, S1/S2, no JVD, no peripheral edema  Resp: clear to auscultation bilaterally, normal RR and effort noted  GI: soft, + epigastric tenderness, with active bowel sounds. No guarding or palpable organomegaly noted.  Skin; warm and dry, no rash or jaundice noted  Neuro: awake, alert and oriented x 3. Normal gross motor function and fluent speech  Labs:  CBC Latest Ref Rng & Units 10/07/2018 06/18/2018 09/06/2017  WBC 3.4 - 10.8 x10E3/uL 6.7 6.6 8.0  Hemoglobin 11.1 - 15.9 g/dL 13.4 12.9 13.9  Hematocrit 34.0 - 46.6 % 41.8 39.2 43.7  Platelets 150 - 450 x10E3/uL 254 250 244   CMP Latest Ref Rng & Units 10/07/2018 06/18/2018 09/06/2017  Glucose 65 - 99 mg/dL 91 84 108(H)  BUN 6 - 24 mg/dL 9 8 9   Creatinine 0.57 - 1.00 mg/dL 1.07(H) 0.93 0.94  Sodium 134 - 144 mmol/L 142 141 139  Potassium 3.5 - 5.2 mmol/L 4.3 4.4 3.7  Chloride 96 - 106 mmol/L 103 100 106  CO2 20 - 29 mmol/L 26 25 25   Calcium 8.7 - 10.2 mg/dL 9.7 8.7 9.1  Total Protein 6.0 - 8.5 g/dL 6.7 6.1 -  Total Bilirubin 0.0 - 1.2 mg/dL 0.4 0.3 -  Alkaline Phos 39 - 117 IU/L 96 98 -  AST 0 - 40 IU/L 19 24 -  ALT 0 - 32 IU/L 12 9 -  Radiologic Studies:  CLINICAL DATA:  Right upper quadrant pain   EXAM: ULTRASOUND ABDOMEN LIMITED RIGHT UPPER QUADRANT   COMPARISON:  None.   FINDINGS: Gallbladder:   Within the gallbladder, there is a 1 cm echogenic focus which moves in shadows consistent with cholelithiasis. There is no gallbladder wall thickening or  pericholecystic fluid. No sonographic Murphy sign noted by sonographer.   Common bile duct:   Diameter: 3 mm. No intrahepatic or extrahepatic biliary duct dilatation.   Liver:   No focal lesion identified. Within normal limits in parenchymal echogenicity. Portal vein is patent on color Doppler imaging with normal direction of blood flow towards the liver.   IMPRESSION: Cholelithiasis.  Study otherwise unremarkable.     Electronically Signed   By: Lowella Grip III M.D.   On: 07/11/2017 13:05 CLINICAL DATA:  Unintended weight loss   EXAM: CT ABDOMEN AND PELVIS WITH CONTRAST   TECHNIQUE: Multidetector CT imaging of the abdomen and pelvis was performed using the standard protocol following bolus administration of intravenous contrast.   CONTRAST:  145mL ISOVUE-300 IOPAMIDOL (ISOVUE-300) INJECTION 61%   COMPARISON:  Ultrasound 07/11/2017   FINDINGS: Lower chest: Lung bases demonstrate no acute consolidation or effusion. The heart size is normal   Hepatobiliary: Calcified stone or small polyp in the gallbladder fundus. No biliary dilatation. No focal hepatic abnormality   Pancreas: Prominence of the pancreatic duct.  No inflammatory change   Spleen: Normal in size without focal abnormality.   Adrenals/Urinary Tract: Adrenal glands are unremarkable. Kidneys are normal, without renal calculi, focal lesion, or hydronephrosis. Bladder is unremarkable.   Stomach/Bowel: Stomach is within normal limits. Appendix appears normal. No evidence of bowel wall thickening, distention, or inflammatory changes. Diverticular disease of the sigmoid colon without acute inflammatory change   Vascular/Lymphatic: Mild aortic atherosclerosis without aneurysm. No significantly enlarged lymph nodes.   Reproductive: Uterus and bilateral adnexa are unremarkable.   Other: Negative for free air or free fluid.   Musculoskeletal: Post laminectomy changes at L3-L4 and L4-L5. Grade 1  anterolisthesis L4 on L5. Advanced degenerative change at L5-S1.   IMPRESSION: 1. No CT evidence for acute intra-abdominal or pelvic abnormality. 2. Small stone or polyp in the gallbladder fundus 3. Prominent pancreatic duct without discrete mass or inflammatory change 4. Diverticular disease of the sigmoid colon without acute inflammatory change     Electronically Signed   By: Donavan Foil M.D.   On: 10/15/2018 02:26  Images personally reviewed - stomach evaluation limited by retained food.  + oral contrast study Assessment: Encounter Diagnoses  Name Primary?  . Epigastric pain Yes  . Nausea and vomiting in adult   . Weight loss   . Early satiety     Worrisome symptoms, even though weight loss does not appear to be as much as she perceived.  Possible peptic ulcer, malignancy.  Some of it sounds like biliary colic, and perhaps it originally began that way, but chronic NSAID use may have been led to additional problems in the stomach.  Plan:  Stop famotidine and start omeprazole 40 mg daily Stop ibuprofen and all NSAIDs Upper endoscopy next week.  She is agreeable after discussion of procedure and risks. Depending on results, may need general surgical referral for gallstone.  Thank you for the courtesy of this consult.  Please call me with any questions or concerns.  Nelida Meuse III  CC: Referring provider noted above

## 2019-03-02 ENCOUNTER — Telehealth: Payer: Self-pay

## 2019-03-02 NOTE — Telephone Encounter (Signed)
Covid-19 screening questions   Do you now or have you had a fever in the last 14 days?  Do you have any respiratory symptoms of shortness of breath or cough now or in the last 14 days?  Do you have any family members or close contacts with diagnosed or suspected Covid-19 in the past 14 days?  Have you been tested for Covid-19 and found to be positive?       

## 2019-03-03 ENCOUNTER — Encounter: Payer: Self-pay | Admitting: Gastroenterology

## 2019-03-03 ENCOUNTER — Other Ambulatory Visit: Payer: Self-pay

## 2019-03-03 ENCOUNTER — Ambulatory Visit (AMBULATORY_SURGERY_CENTER): Payer: Medicare HMO | Admitting: Gastroenterology

## 2019-03-03 VITALS — BP 188/95 | HR 69 | Temp 98.6°F | Resp 15 | Ht 62.0 in | Wt 161.0 lb

## 2019-03-03 DIAGNOSIS — R1013 Epigastric pain: Secondary | ICD-10-CM

## 2019-03-03 DIAGNOSIS — K2950 Unspecified chronic gastritis without bleeding: Secondary | ICD-10-CM | POA: Diagnosis not present

## 2019-03-03 DIAGNOSIS — B9681 Helicobacter pylori [H. pylori] as the cause of diseases classified elsewhere: Secondary | ICD-10-CM | POA: Diagnosis not present

## 2019-03-03 DIAGNOSIS — R634 Abnormal weight loss: Secondary | ICD-10-CM

## 2019-03-03 MED ORDER — SODIUM CHLORIDE 0.9 % IV SOLN: 500.0000 mL | Freq: Once | INTRAVENOUS | Status: DC

## 2019-03-03 NOTE — Patient Instructions (Signed)
Thank you for allowing [place] to care for you today!  Await pathology results by mail, approximately 2 weeks.  Recommendations, if any will be made at that time.  Resume previous diet and medications today.  Return to your normal activities tomorrow.  DISCONTINUE ASPIRIN AND NSAIDS (IBUPROFEN,  NAPROXYN)  If you are negative for H Pylori, we will refer you to general surgeon to run tests for gall stones.     YOU HAD AN ENDOSCOPIC PROCEDURE TODAY AT [facility]:   Refer to the procedure report that was given to you for any specific questions about what was found during the examination.  If the procedure report does not answer your questions, please call your gastroenterologist to clarify.  If you requested that your care partner not be given the details of your procedure findings, then the procedure report has been included in a sealed envelope for you to review at your convenience later.  YOU SHOULD EXPECT: Some feelings of bloating in the abdomen. Passage of more gas than usual.  Walking can help get rid of the air that was put into your GI tract during the procedure and reduce the bloating. If you had a lower endoscopy (such as a colonoscopy or flexible sigmoidoscopy) you may notice spotting of blood in your stool or on the toilet paper. If you underwent a bowel prep for your procedure, you may not have a normal bowel movement for a few days.  Please Note:  You might notice some irritation and congestion in your nose or some drainage.  This is from the oxygen used during your procedure.  There is no need for concern and it should clear up in a day or so.  SYMPTOMS TO REPORT IMMEDIATELY:     Following upper endoscopy (EGD)  Vomiting of blood or coffee ground material  New chest pain or pain under the shoulder blades  Painful or persistently difficult swallowing  New shortness of breath  Fever of 100F or higher  Black, tarry-looking stools  For urgent or emergent issues,  a gastroenterologist can be reached at any hour by calling [phone].   DIET:  We do recommend a small meal at first, but then you may proceed to your regular diet.  Drink plenty of fluids but you should avoid alcoholic beverages for 24 hours.  ACTIVITY:  You should plan to take it easy for the rest of today and you should NOT DRIVE or use heavy machinery until tomorrow (because of the sedation medicines used during the test).    FOLLOW UP: Our staff will call the number listed on your records 48-72 hours following your procedure to check on you and address any questions or concerns that you may have regarding the information given to you following your procedure. If we do not reach you, we will leave a message.  We will attempt to reach you two times.  During this call, we will ask if you have developed any symptoms of COVID 19. If you develop any symptoms (ie: fever, flu-like symptoms, shortness of breath, cough etc.) before then, please call [phone].  If you test positive for Covid 19 in the 2 weeks post procedure, please call and report this information to [place].    If any biopsies were taken you will be contacted by phone or by letter within the next 1-3 weeks.  Please call us at [phone] if you have not heard about the biopsies in 3 weeks.    SIGNATURES/CONFIDENTIALITY: You  and/or your care partner have signed paperwork which will be entered into your electronic medical record.  These signatures attest to the fact that that the information above on your After Visit Summary has been reviewed and is understood.  Full responsibility of the confidentiality of this discharge information lies with you and/or your care-partner.

## 2019-03-03 NOTE — Progress Notes (Signed)
To PACU, vss. Report to [email or web address].tb 

## 2019-03-03 NOTE — Progress Notes (Signed)
KA- Temp CW- Vitals 

## 2019-03-03 NOTE — Op Note (Signed)
[facility] Patient Name: Kristen [name] Procedure Date: 03/03/2019 10:26 AM MRN: [id number] Endoscopist: Mallie Mussel L. Loletha Carrow , MD Age: 58 Referring MD:  Date of Birth: [birth year] Gender: Female Account #: [id number] Procedure:                Upper GI endoscopy Indications:              Epigastric abdominal pain, Weight loss (see recent                            office note for details) Medicines:                Monitored Anesthesia Care Procedure:                Pre-Anesthesia Assessment:                           - Prior to the procedure, a History and Physical                            was performed, and patient medications and                            allergies were reviewed. The patient's tolerance of                            previous anesthesia was also reviewed. The risks                            and benefits of the procedure and the sedation                            options and risks were discussed with the patient.                            All questions were answered, and informed consent                            was obtained. Prior Anticoagulants: The patient has                            taken no previous anticoagulant or antiplatelet                            agents. ASA Grade Assessment: III - A patient with                            severe systemic disease. After reviewing the risks                            and benefits, the patient was deemed in                            satisfactory condition to undergo the procedure.  After obtaining informed consent, the endoscope was                            passed under direct vision. Throughout the                            procedure, the patient's blood pressure, pulse, and                            oxygen saturations were monitored continuously. The                            Endoscope was introduced through the mouth, and                            advanced to the second  part of duodenum. The upper                            GI endoscopy was accomplished without difficulty.                            The patient tolerated the procedure well. Scope In: Scope Out: Findings:                 The esophagus was normal.                           Diffuse erythematous mucosa was found in the                            gastric antrum. Biopsies were taken with a cold                            forceps for histology. (Sidney protocol).                           The cardia and gastric fundus were normal on                            retroflexion.                           The examined duodenum was normal. Complications:            No immediate complications. Estimated Blood Loss:     Estimated blood loss: none. Estimated blood loss                            was minimal. Impression:               - Normal esophagus.                           - Erythematous mucosa in the antrum. Biopsied.                           - Normal examined  duodenum. Recommendation:           - Patient has a contact number available for                            emergencies. The signs and symptoms of potential                            delayed complications were discussed with the                            patient. Return to normal activities tomorrow.                            Written discharge instructions were provided to the                            patient.                           - Resume previous diet.                           - Continue present medications.                           - Discontinue aspirin and NSAIDs.                           - Await pathology results. If negative for H.                            pylori, refer patient to general surgery for                            cholelithiasis. Broly Hatfield L. Loletha Carrow, MD 03/03/2019 10:42:44 AM This report has been signed electronically.

## 2019-03-03 NOTE — Progress Notes (Signed)
Called to room to assist during endoscopic procedure.  Patient ID and intended procedure confirmed with present staff. Received instructions for my participation in the procedure from the performing physician.  

## 2019-03-05 ENCOUNTER — Telehealth: Payer: Self-pay

## 2019-03-05 NOTE — Telephone Encounter (Signed)
  Follow up Call-  Call back number 03/03/2019  Post procedure Call Back phone  # [phone]  Permission to leave phone message Yes  Some recent data might be hidden     Patient questions:  Do you have a fever, pain , or abdominal swelling? No. Pain Score  0 *  Have you tolerated food without any problems? Yes.    Have you been able to return to your normal activities? Yes.    Do you have any questions about your discharge instructions: Diet   No. Medications  No. Follow up visit  No.  Do you have questions or concerns about your Care? Yes.    Pt states her lips are still feeling numb, pt is not concerned but states she will call back if it becomes bothersome.  Actions: * If pain score is 4 or above: No action needed, pain <4.  1. Have you developed a fever since your procedure? no  2.   Have you had an respiratory symptoms (SOB or cough) since your procedure? no  3.   Have you tested positive for COVID 19 since your procedure no  4.   Have you had any family members/close contacts diagnosed with the COVID 19 since your procedure?  no   If yes to any of these questions please route to Joylene John, RN and Alphonsa Gin, Therapist, sports.

## 2019-03-09 NOTE — Telephone Encounter (Signed)
Thank you for letting me know - I expect that will pass.

## 2019-03-11 ENCOUNTER — Encounter: Payer: Self-pay | Admitting: *Deleted

## 2019-03-11 ENCOUNTER — Other Ambulatory Visit: Payer: Self-pay | Admitting: *Deleted

## 2019-03-11 DIAGNOSIS — R1013 Epigastric pain: Secondary | ICD-10-CM

## 2019-03-11 MED ORDER — BISMUTH SUBSALICYLATE 262 MG PO CHEW: 524.0000 mg | CHEWABLE_TABLET | Freq: Four times a day (QID) | ORAL | 0 refills | Status: AC

## 2019-03-11 MED ORDER — METRONIDAZOLE 250 MG PO TABS: 250.0000 mg | ORAL_TABLET | Freq: Four times a day (QID) | ORAL | 0 refills | Status: AC

## 2019-03-11 MED ORDER — DOXYCYCLINE HYCLATE 100 MG PO CAPS: 100.0000 mg | ORAL_CAPSULE | Freq: Two times a day (BID) | ORAL | 0 refills | Status: AC

## 2019-06-02 ENCOUNTER — Ambulatory Visit (INDEPENDENT_AMBULATORY_CARE_PROVIDER_SITE_OTHER): Payer: Medicare HMO | Admitting: Student in an Organized Health Care Education/Training Program

## 2019-06-02 ENCOUNTER — Encounter: Payer: Self-pay | Admitting: Student in an Organized Health Care Education/Training Program

## 2019-06-02 ENCOUNTER — Other Ambulatory Visit: Payer: Self-pay

## 2019-06-02 DIAGNOSIS — R1011 Right upper quadrant pain: Secondary | ICD-10-CM | POA: Diagnosis not present

## 2019-06-02 NOTE — Assessment & Plan Note (Signed)
Pain appears somewhat atypical for cholelithiasis.  Physical exam and ultrasound exam suggestive of gallbladder disease.  CT in May of last year showed gallstone or polyp. Recommended follow-up with general surgery for definitive treatment however, patient preferred to dietary changes to assess for improvement in symptoms prior to this referral. With very gradual weight loss, would like patient to go forward with cholecystectomy including pathology sooner than later. Provided patient with guidance on dietary changes to help with biliary colic.

## 2019-06-02 NOTE — Patient Instructions (Signed)
It was a pleasure to see you today!  To summarize our discussion for this visit:  It appears that your right sided pain is likely from gallstones. We discussed making dietary changes to help prevent the symptoms, but if that doesn't work, the ultimate treatment is to take out the gallbladder.   Please let me know if you have any further questions or if you would like a referral to a surgeon   Please return to our clinic to see me as needed.  Call the clinic at [phone] if your symptoms worsen or you have any concerns.   Thank you for allowing me to take part in your care,  Dr. Doristine Mango   Cholelithiasis  Cholelithiasis is also called "gallstones." It is a kind of gallbladder disease. The gallbladder is an organ that stores a liquid (bile) that helps you digest fat. Gallstones may not cause symptoms (may be silent gallstones) until they cause a blockage, and then they can cause pain (gallbladder attack). Follow these instructions at home:  Take over-the-counter and prescription medicines only as told by your doctor.  Stay at a healthy weight.  Eat healthy foods. This includes: ? Eating fewer fatty foods, like fried foods. ? Eating fewer refined carbs (refined carbohydrates). Refined carbs are breads and grains that are highly processed, like white bread and white rice. Instead, choose whole grains like whole-wheat bread and brown rice. ? Eating more fiber. Almonds, fresh fruit, and beans are healthy sources of fiber.  Keep all follow-up visits as told by your doctor. This is important. Contact a doctor if:  You have sudden pain in the upper right side of your belly (abdomen). Pain might spread to your right shoulder or your chest. This may be a sign of a gallbladder attack.  You feel sick to your stomach (are nauseous).  You throw up (vomit).  You have been diagnosed with gallstones that have no symptoms and you get: ? Belly pain. ? Discomfort, burning, or  fullness in the upper part of your belly (indigestion). Get help right away if:  You have sudden pain in the upper right side of your belly, and it lasts for more than 2 hours.  You have belly pain that lasts for more than 5 hours.  You have a fever or chills.  You keep feeling sick to your stomach or you keep throwing up.  Your skin or the whites of your eyes turn yellow (jaundice).  You have dark-colored pee (urine).  You have light-colored poop (stool). Summary  Cholelithiasis is also called "gallstones."  The gallbladder is an organ that stores a liquid (bile) that helps you digest fat.  Silent gallstones are gallstones that do not cause symptoms.  A gallbladder attack may cause sudden pain in the upper right side of your belly. Pain might spread to your right shoulder or your chest. If this happens, contact your doctor.  If you have sudden pain in the upper right side of your belly that lasts for more than 2 hours, get help right away. This information is not intended to replace advice given to you by your health care provider. Make sure you discuss any questions you have with your health care provider. Document Revised: 04/18/2017 Document Reviewed: 01/21/2016 Elsevier Patient Education  2020 [employer].

## 2019-06-02 NOTE — Progress Notes (Signed)
   Subjective:    Patient ID: Kristen [name], female    DOB: 1960/07/23, 59 y.o.   MRN: [id number]  CC: RUQ pain  HPI:  Patient recently treated for H. pylori verified with biopsy from EGD with resolution of symptoms.  Continues to have right upper quadrant pain.  Not associated with nausea or vomiting or changes in bowel habits.  Denies any further weight loss but has had fluctuating weight throughout the years.  Pain is present while walking or resting.  Does not wake her up from sleep.  She does not believe it is associated with postprandial.  She takes Tylenol for the pain which does help.  The pain will last for up to an hour and a half and will resolve spontaneously.  Occurs daily up to 4 times per day.  Last ate approximately 1-2 hours prior to appointment with a steak biscuit.  Smoking status reviewed   ROS: pertinent noted in the HPI  I have personally reviewed pertinent past medical history, surgical, family, and social history as appropriate. Objective:  BP 124/82   Pulse 83   Wt 162 lb 6.4 oz (73.7 kg)   SpO2 98%   BMI 29.70 kg/m   Vitals and nursing note reviewed  General: NAD, pleasant, able to participate in exam Cardiac: RRR, S1 S2 present. normal heart sounds, no murmurs. Respiratory: CTAB, normal effort, No wheezes, rales or rhonchi Abdomen: Negative tenderness to palpation on left-sided abdominal exam.  Patient had tenderness to right lower quadrant mildly and more severe in the right upper quadrant.  Positive Murphy sign.  Lateral scar present.  Normal bowel sounds diffusely. Extremities: no edema or cyanosis. Skin: warm and dry, no rashes noted Neuro: alert, no obvious focal deficits Psych: Normal affect and mood  US showed gallbladder with one large stone as well as sludge.  Did not appear to have surrounding fluid.  Assessment & Plan:   Right upper quadrant pain Pain appears somewhat atypical for cholelithiasis.  Physical exam and ultrasound exam  suggestive of gallbladder disease.  CT in May of last year showed gallstone or polyp. Recommended follow-up with general surgery for definitive treatment however, patient preferred to dietary changes to assess for improvement in symptoms prior to this referral. With very gradual weight loss, would like patient to go forward with cholecystectomy including pathology sooner than later. Provided patient with guidance on dietary changes to help with biliary colic.   Doristine Mango, [facility] PGY-2

## 2019-06-28 ENCOUNTER — Telehealth: Payer: Self-pay | Admitting: *Deleted

## 2019-06-28 DIAGNOSIS — R1011 Right upper quadrant pain: Secondary | ICD-10-CM

## 2019-06-28 NOTE — Telephone Encounter (Signed)
Pt called back and states that she is now interested in being sent to the surgeon for her abdominal pain.  To PCP. Christen Bame, CMA

## 2019-06-30 NOTE — Telephone Encounter (Signed)
Referral for general surgery placed.

## 2019-06-30 NOTE — Addendum Note (Signed)
Addended by: Richarda Osmond on: 06/30/2019 08:43 AM   Modules accepted: Orders

## 2019-08-18 ENCOUNTER — Other Ambulatory Visit: Payer: Self-pay | Admitting: *Deleted

## 2019-08-18 DIAGNOSIS — I10 Essential (primary) hypertension: Secondary | ICD-10-CM

## 2019-08-18 DIAGNOSIS — M79604 Pain in right leg: Secondary | ICD-10-CM

## 2019-08-18 DIAGNOSIS — M79605 Pain in left leg: Secondary | ICD-10-CM

## 2019-08-21 MED ORDER — LOSARTAN POTASSIUM 50 MG PO TABS: 50.0000 mg | ORAL_TABLET | Freq: Two times a day (BID) | ORAL | 1 refills | Status: DC

## 2019-08-21 MED ORDER — GABAPENTIN 300 MG PO CAPS: 600.0000 mg | ORAL_CAPSULE | Freq: Two times a day (BID) | ORAL | 2 refills | Status: DC

## 2019-08-21 MED ORDER — CARVEDILOL 25 MG PO TABS: 25.0000 mg | ORAL_TABLET | Freq: Two times a day (BID) | ORAL | 3 refills | Status: AC

## 2019-08-30 ENCOUNTER — Ambulatory Visit: Payer: Medicare HMO | Attending: Internal Medicine

## 2019-08-30 DIAGNOSIS — Z23 Encounter for immunization: Secondary | ICD-10-CM

## 2019-08-30 NOTE — Progress Notes (Signed)
   Covid-19 Vaccination Clinic  Name:  DAVISHA GALLEGO    MRN: [id number] DOB: [birth year]  08/30/2019  Ms. Rothe was observed post Covid-19 immunization for 15 minutes without incident. She was provided with Vaccine Information Sheet and instruction to access the V-Safe system.   Ms. Arellano was instructed to call 911 with any severe reactions post vaccine: Marland Kitchen Difficulty breathing  . Swelling of face and throat  . A fast heartbeat  . A bad rash all over body  . Dizziness and weakness   Immunizations Administered    Name Date Dose VIS Date Route   Pfizer COVID-19 Vaccine 08/30/2019  8:35 AM 0.3 mL 04/30/2019 Intramuscular   Manufacturer: [place]   Lot: [id number]   [place]: [id number]

## 2019-08-31 DIAGNOSIS — Z114 Encounter for screening for human immunodeficiency virus [HIV]: Secondary | ICD-10-CM | POA: Diagnosis not present

## 2019-08-31 DIAGNOSIS — R109 Unspecified abdominal pain: Secondary | ICD-10-CM | POA: Diagnosis not present

## 2019-08-31 DIAGNOSIS — Z0000 Encounter for general adult medical examination without abnormal findings: Secondary | ICD-10-CM | POA: Diagnosis not present

## 2019-08-31 DIAGNOSIS — Z13220 Encounter for screening for lipoid disorders: Secondary | ICD-10-CM | POA: Diagnosis not present

## 2019-08-31 DIAGNOSIS — R300 Dysuria: Secondary | ICD-10-CM | POA: Diagnosis not present

## 2019-08-31 DIAGNOSIS — Z131 Encounter for screening for diabetes mellitus: Secondary | ICD-10-CM | POA: Diagnosis not present

## 2019-08-31 DIAGNOSIS — R7303 Prediabetes: Secondary | ICD-10-CM | POA: Diagnosis not present

## 2019-08-31 DIAGNOSIS — R5383 Other fatigue: Secondary | ICD-10-CM | POA: Diagnosis not present

## 2019-09-02 DIAGNOSIS — R109 Unspecified abdominal pain: Secondary | ICD-10-CM | POA: Diagnosis not present

## 2019-09-15 DIAGNOSIS — Z789 Other specified health status: Secondary | ICD-10-CM | POA: Diagnosis not present

## 2019-09-15 DIAGNOSIS — Z1339 Encounter for screening examination for other mental health and behavioral disorders: Secondary | ICD-10-CM | POA: Diagnosis not present

## 2019-09-15 DIAGNOSIS — K8020 Calculus of gallbladder without cholecystitis without obstruction: Secondary | ICD-10-CM | POA: Diagnosis not present

## 2019-09-15 DIAGNOSIS — N183 Chronic kidney disease, stage 3 unspecified: Secondary | ICD-10-CM | POA: Diagnosis not present

## 2019-09-15 DIAGNOSIS — R7303 Prediabetes: Secondary | ICD-10-CM | POA: Diagnosis not present

## 2019-09-16 DIAGNOSIS — N183 Chronic kidney disease, stage 3 unspecified: Secondary | ICD-10-CM | POA: Diagnosis not present

## 2019-09-16 DIAGNOSIS — K219 Gastro-esophageal reflux disease without esophagitis: Secondary | ICD-10-CM | POA: Diagnosis not present

## 2019-09-21 ENCOUNTER — Other Ambulatory Visit: Payer: Self-pay | Admitting: Adult Medicine

## 2019-09-21 DIAGNOSIS — Z1231 Encounter for screening mammogram for malignant neoplasm of breast: Secondary | ICD-10-CM

## 2019-09-22 ENCOUNTER — Ambulatory Visit: Payer: Medicare HMO | Attending: Internal Medicine

## 2019-09-22 DIAGNOSIS — Z23 Encounter for immunization: Secondary | ICD-10-CM

## 2019-09-22 DIAGNOSIS — E559 Vitamin D deficiency, unspecified: Secondary | ICD-10-CM | POA: Diagnosis not present

## 2019-09-22 DIAGNOSIS — Z79899 Other long term (current) drug therapy: Secondary | ICD-10-CM | POA: Diagnosis not present

## 2019-09-22 DIAGNOSIS — F331 Major depressive disorder, recurrent, moderate: Secondary | ICD-10-CM | POA: Diagnosis not present

## 2019-09-22 DIAGNOSIS — F411 Generalized anxiety disorder: Secondary | ICD-10-CM | POA: Diagnosis not present

## 2019-09-22 DIAGNOSIS — R5383 Other fatigue: Secondary | ICD-10-CM | POA: Diagnosis not present

## 2019-09-22 DIAGNOSIS — G4700 Insomnia, unspecified: Secondary | ICD-10-CM | POA: Diagnosis not present

## 2019-09-22 NOTE — Progress Notes (Signed)
   Covid-19 Vaccination Clinic  Name:  Kristen [name]    MRN: [id number] DOB: [birth year]  09/22/2019  Kristen [name] was observed post Covid-19 immunization for 15 minutes without incident. She was provided with Vaccine Information Sheet and instruction to access the V-Safe system.   Kristen [name] was instructed to call 911 with any severe reactions post vaccine: Marland Kitchen Difficulty breathing  . Swelling of face and throat  . A fast heartbeat  . A bad rash all over body  . Dizziness and weakness   Immunizations Administered    Name Date Dose VIS Date Route   Pfizer COVID-19 Vaccine 09/22/2019  8:09 AM 0.3 mL 07/14/2018 Intramuscular   Manufacturer: [place]   Lot: [id number]   [place]: [id number]

## 2019-10-11 DIAGNOSIS — K635 Polyp of colon: Secondary | ICD-10-CM | POA: Diagnosis not present

## 2019-10-11 DIAGNOSIS — Z01818 Encounter for other preprocedural examination: Secondary | ICD-10-CM | POA: Diagnosis not present

## 2019-10-11 DIAGNOSIS — Z1211 Encounter for screening for malignant neoplasm of colon: Secondary | ICD-10-CM | POA: Diagnosis not present

## 2019-10-18 DIAGNOSIS — K635 Polyp of colon: Secondary | ICD-10-CM | POA: Diagnosis not present

## 2019-10-21 DIAGNOSIS — E559 Vitamin D deficiency, unspecified: Secondary | ICD-10-CM | POA: Diagnosis not present

## 2019-10-21 DIAGNOSIS — F411 Generalized anxiety disorder: Secondary | ICD-10-CM | POA: Diagnosis not present

## 2019-10-21 DIAGNOSIS — G4700 Insomnia, unspecified: Secondary | ICD-10-CM | POA: Diagnosis not present

## 2019-10-21 DIAGNOSIS — Z79899 Other long term (current) drug therapy: Secondary | ICD-10-CM | POA: Diagnosis not present

## 2019-10-21 DIAGNOSIS — F331 Major depressive disorder, recurrent, moderate: Secondary | ICD-10-CM | POA: Diagnosis not present

## 2019-11-04 DIAGNOSIS — Z789 Other specified health status: Secondary | ICD-10-CM | POA: Diagnosis not present

## 2019-11-04 DIAGNOSIS — R519 Headache, unspecified: Secondary | ICD-10-CM | POA: Diagnosis not present

## 2019-11-04 DIAGNOSIS — R7303 Prediabetes: Secondary | ICD-10-CM | POA: Diagnosis not present

## 2019-11-04 DIAGNOSIS — N183 Chronic kidney disease, stage 3 unspecified: Secondary | ICD-10-CM | POA: Diagnosis not present

## 2019-11-12 ENCOUNTER — Other Ambulatory Visit: Payer: Self-pay

## 2019-11-12 ENCOUNTER — Ambulatory Visit
Admission: RE | Admit: 2019-11-12 | Discharge: 2019-11-12 | Disposition: A | Discharge status: Home / Self Care | Payer: Medicare HMO | Source: Ambulatory Visit | Attending: Adult Medicine | Admitting: Adult Medicine

## 2019-11-12 DIAGNOSIS — Z1231 Encounter for screening mammogram for malignant neoplasm of breast: Secondary | ICD-10-CM | POA: Diagnosis not present

## 2019-11-17 DIAGNOSIS — K8020 Calculus of gallbladder without cholecystitis without obstruction: Secondary | ICD-10-CM | POA: Diagnosis not present

## 2019-11-17 DIAGNOSIS — K5730 Diverticulosis of large intestine without perforation or abscess without bleeding: Secondary | ICD-10-CM | POA: Diagnosis not present

## 2019-11-17 DIAGNOSIS — D126 Benign neoplasm of colon, unspecified: Secondary | ICD-10-CM | POA: Diagnosis not present

## 2019-11-18 DIAGNOSIS — H2513 Age-related nuclear cataract, bilateral: Secondary | ICD-10-CM | POA: Diagnosis not present

## 2019-11-18 DIAGNOSIS — H18413 Arcus senilis, bilateral: Secondary | ICD-10-CM | POA: Diagnosis not present

## 2019-11-18 DIAGNOSIS — H40013 Open angle with borderline findings, low risk, bilateral: Secondary | ICD-10-CM | POA: Diagnosis not present

## 2019-11-18 DIAGNOSIS — H5203 Hypermetropia, bilateral: Secondary | ICD-10-CM | POA: Diagnosis not present

## 2019-11-18 DIAGNOSIS — H40033 Anatomical narrow angle, bilateral: Secondary | ICD-10-CM | POA: Diagnosis not present

## 2019-11-18 DIAGNOSIS — H16223 Keratoconjunctivitis sicca, not specified as Sjogren's, bilateral: Secondary | ICD-10-CM | POA: Diagnosis not present

## 2019-11-18 DIAGNOSIS — H52222 Regular astigmatism, left eye: Secondary | ICD-10-CM | POA: Diagnosis not present

## 2019-11-18 DIAGNOSIS — H25013 Cortical age-related cataract, bilateral: Secondary | ICD-10-CM | POA: Diagnosis not present

## 2019-11-18 DIAGNOSIS — H11823 Conjunctivochalasis, bilateral: Secondary | ICD-10-CM | POA: Diagnosis not present

## 2019-12-03 DIAGNOSIS — I10 Essential (primary) hypertension: Secondary | ICD-10-CM | POA: Diagnosis not present

## 2019-12-03 DIAGNOSIS — Z0000 Encounter for general adult medical examination without abnormal findings: Secondary | ICD-10-CM | POA: Diagnosis not present

## 2019-12-03 DIAGNOSIS — E785 Hyperlipidemia, unspecified: Secondary | ICD-10-CM | POA: Diagnosis not present

## 2019-12-03 DIAGNOSIS — N183 Chronic kidney disease, stage 3 unspecified: Secondary | ICD-10-CM | POA: Diagnosis not present

## 2019-12-03 DIAGNOSIS — E559 Vitamin D deficiency, unspecified: Secondary | ICD-10-CM | POA: Diagnosis not present

## 2019-12-03 DIAGNOSIS — R7303 Prediabetes: Secondary | ICD-10-CM | POA: Diagnosis not present

## 2019-12-03 DIAGNOSIS — Z114 Encounter for screening for human immunodeficiency virus [HIV]: Secondary | ICD-10-CM | POA: Diagnosis not present

## 2019-12-23 DIAGNOSIS — E559 Vitamin D deficiency, unspecified: Secondary | ICD-10-CM | POA: Diagnosis not present

## 2019-12-23 DIAGNOSIS — F411 Generalized anxiety disorder: Secondary | ICD-10-CM | POA: Diagnosis not present

## 2019-12-23 DIAGNOSIS — Z79899 Other long term (current) drug therapy: Secondary | ICD-10-CM | POA: Diagnosis not present

## 2019-12-23 DIAGNOSIS — F331 Major depressive disorder, recurrent, moderate: Secondary | ICD-10-CM | POA: Diagnosis not present

## 2019-12-23 DIAGNOSIS — G4700 Insomnia, unspecified: Secondary | ICD-10-CM | POA: Diagnosis not present

## 2020-01-03 DIAGNOSIS — S93402A Sprain of unspecified ligament of left ankle, initial encounter: Secondary | ICD-10-CM | POA: Diagnosis not present

## 2020-01-03 DIAGNOSIS — Z711 Person with feared health complaint in whom no diagnosis is made: Secondary | ICD-10-CM | POA: Diagnosis not present

## 2020-01-03 DIAGNOSIS — M79672 Pain in left foot: Secondary | ICD-10-CM | POA: Diagnosis not present

## 2020-01-03 DIAGNOSIS — R7303 Prediabetes: Secondary | ICD-10-CM | POA: Diagnosis not present

## 2020-01-03 DIAGNOSIS — Z789 Other specified health status: Secondary | ICD-10-CM | POA: Diagnosis not present

## 2020-01-06 DIAGNOSIS — S93402A Sprain of unspecified ligament of left ankle, initial encounter: Secondary | ICD-10-CM | POA: Diagnosis not present

## 2020-01-06 DIAGNOSIS — Z87891 Personal history of nicotine dependence: Secondary | ICD-10-CM | POA: Diagnosis not present

## 2020-01-06 DIAGNOSIS — Z789 Other specified health status: Secondary | ICD-10-CM | POA: Diagnosis not present

## 2020-01-06 DIAGNOSIS — R7303 Prediabetes: Secondary | ICD-10-CM | POA: Diagnosis not present

## 2020-01-06 DIAGNOSIS — I10 Essential (primary) hypertension: Secondary | ICD-10-CM | POA: Diagnosis not present

## 2020-01-06 DIAGNOSIS — M79672 Pain in left foot: Secondary | ICD-10-CM | POA: Diagnosis not present

## 2020-01-10 DIAGNOSIS — Z87891 Personal history of nicotine dependence: Secondary | ICD-10-CM | POA: Diagnosis not present

## 2020-01-17 ENCOUNTER — Ambulatory Visit: Payer: Self-pay | Admitting: Surgery

## 2020-01-17 DIAGNOSIS — K8010 Calculus of gallbladder with chronic cholecystitis without obstruction: Secondary | ICD-10-CM | POA: Diagnosis not present

## 2020-01-17 NOTE — H&P (Signed)
History of Present Illness Kristen [name]. Kristen Nishida MD; 01/17/2020 2:30 PM) The patient is a 59 year old female who presents for evaluation of gall stones. PCP - Doristine Mango, MD  This is a 59 year old female who presents with several month history of right or quadrant abdominal pain. This pain is exacerbated by eating. This is associated with nausea, diarrhea, bloating, and radiation up to her right shoulder. She had a CT scan in 2020 that showed the presence of small gallstones. She was not referred at that time. Subsequently, she transferred her care to [facility]. She has had 2 ultrasounds, a colonoscopy, and a CT scan. This confirms the presence of gallstones. She presents now to discuss surgery. She is on disability after back surgery with chronic pain in her legs.  CLINICAL DATA: Unintended weight loss  EXAM: CT ABDOMEN AND PELVIS WITH CONTRAST  TECHNIQUE: Multidetector CT imaging of the abdomen and pelvis was performed using the standard protocol following bolus administration of intravenous contrast.  CONTRAST: 150mL ISOVUE-300 IOPAMIDOL (ISOVUE-300) INJECTION 61%  COMPARISON: Ultrasound 07/11/2017  FINDINGS: Lower chest: Lung bases demonstrate no acute consolidation or effusion. The heart size is normal  Hepatobiliary: Calcified stone or small polyp in the gallbladder fundus. No biliary dilatation. No focal hepatic abnormality  Pancreas: Prominence of the pancreatic duct. No inflammatory change  Spleen: Normal in size without focal abnormality.  Adrenals/Urinary Tract: Adrenal glands are unremarkable. Kidneys are normal, without renal calculi, focal lesion, or hydronephrosis. Bladder is unremarkable.  Stomach/Bowel: Stomach is within normal limits. Appendix appears normal. No evidence of bowel wall thickening, distention, or inflammatory changes. Diverticular disease of the sigmoid colon without acute inflammatory change  Vascular/Lymphatic: Mild aortic  atherosclerosis without aneurysm. No significantly enlarged lymph nodes.  Reproductive: Uterus and bilateral adnexa are unremarkable.  Other: Negative for free air or free fluid.  Musculoskeletal: Post laminectomy changes at L3-L4 and L4-L5. Grade 1 anterolisthesis L4 on L5. Advanced degenerative change at L5-S1.  IMPRESSION: 1. No CT evidence for acute intra-abdominal or pelvic abnormality. 2. Small stone or polyp in the gallbladder fundus 3. Prominent pancreatic duct without discrete mass or inflammatory change 4. Diverticular disease of the sigmoid colon without acute inflammatory change   Electronically Signed By: Donavan Foil M.D. On: 10/15/2018 02:26   Problem List/Past Medical Kristen Key K. Kristen Bohlin, MD; 01/17/2020 2:30 PM) CHRONIC CHOLECYSTITIS WITH CALCULUS (K80.10)  Past Surgical History Kristen [name], RMA; 01/17/2020 10:23 AM) Kristen [name]; Reduction Bilateral. Spinal Surgery Midback  Diagnostic Studies History Kristen [name], [place]; 01/17/2020 10:23 AM) Colonoscopy 1-5 years ago Mammogram within last year Pap Smear 1-5 years ago  Allergies Kristen [name], RMA; 01/17/2020 10:24 AM) Penicillin G Potassium *PENICILLINS* Allergies Reconciled  Medication History Kristen [name], RMA; 01/17/2020 10:28 AM) Carvedilol (25MG  Tablet, Oral) Active. Gabapentin (300MG  Capsule, Oral) Active. Losartan Potassium (50MG  Tablet, Oral) Active. Omeprazole (40MG  Capsule DR, Oral) Active. traZODone HCl (50MG  Tablet, Oral) Active. Advil (Oral) Specific strength unknown - Active. Loratadine (10MG  Capsule, Oral) Active. Albuterol Sulfate (Inhalation) Specific strength unknown - Active. Medications Reconciled  Social History Kristen [name], [place]; 01/17/2020 10:23 AM) Caffeine use Tea. Illicit drug use Prefer to discuss with provider. No alcohol use Tobacco use Former smoker.  Family History Kristen [name], [place]; 01/17/2020 10:23 AM) Arthritis Brother,  Father. Bleeding disorder Father. Heart Disease Mother.  Pregnancy / Birth History Kristen [name], [place]; 01/17/2020 10:23 AM) Age at menarche 76 years. Age of menopause <46 Gravida 5 Length (months) of breastfeeding 3-6 Maternal age 20-20 Para 3  Other Problems Kristen [name]. Kristen Adamek,  MD; 01/17/2020 2:30 PM) Arthritis Back Pain Cholelithiasis Gastroesophageal Reflux Disease High blood pressure    Vitals Kristen [name] RMA; 01/17/2020 10:29 AM) 01/17/2020 10:28 AM Weight: 157.13 lb Height: 63in Body Surface Area: 1.75 m Body Mass Index: 27.83 kg/m  Temp.: 97.58F  Pulse: 92 (Regular)  P.OX: 99% (Room air) BP: 124/82(Sitting, Left Arm, Standard)        Physical Exam Kristen Key K. Renley Gutman MD; 01/17/2020 2:31 PM)  The physical exam findings are as follows: Note:Constitutional: WDWN in NAD, conversant, no obvious deformities; resting comfortably Eyes: Pupils equal, round; sclera anicteric; moist conjunctiva; no lid lag HENT: Oral mucosa moist; good dentition Neck: No masses palpated, trachea midline; no thyromegaly Lungs: CTA bilaterally; normal respiratory effort CV: Regular rate and rhythm; no murmurs; extremities well-perfused with no edema Abd: +bowel sounds, soft, mild RUQ tenderness, no palpable organomegaly; no palpable hernias Musc: Normal gait; no apparent clubbing or cyanosis in extremities Lymphatic: No palpable cervical or axillary lymphadenopathy Skin: Warm, dry; no sign of jaundice Psychiatric - alert and oriented x 4; calm mood and affect    Assessment & Plan Kristen Key K. Jayonna Meyering MD; 01/17/2020 10:47 AM)  CHRONIC CHOLECYSTITIS WITH CALCULUS (K80.10)  Current Plans Schedule for Surgery - Laparoscopic cholecystectomy with intraoperative cholangiogram. The surgical procedure has been discussed with the patient. Potential risks, benefits, alternative treatments, and expected outcomes have been explained. All of the patient's questions at this  time have been answered. The likelihood of reaching the patient's treatment goal is good. The patient understand the proposed surgical procedure and wishes to proceed.  Kristen [name]. Georgette Dover, MD, [facility]  General/ Trauma Surgery   01/17/2020 2:31 PM

## 2020-01-18 DIAGNOSIS — Z20822 Contact with and (suspected) exposure to covid-19: Secondary | ICD-10-CM | POA: Diagnosis not present

## 2020-01-18 DIAGNOSIS — R7303 Prediabetes: Secondary | ICD-10-CM | POA: Diagnosis not present

## 2020-01-18 DIAGNOSIS — R5383 Other fatigue: Secondary | ICD-10-CM | POA: Diagnosis not present

## 2020-01-18 DIAGNOSIS — R635 Abnormal weight gain: Secondary | ICD-10-CM | POA: Diagnosis not present

## 2020-01-18 DIAGNOSIS — D539 Nutritional anemia, unspecified: Secondary | ICD-10-CM | POA: Diagnosis not present

## 2020-01-18 DIAGNOSIS — R0602 Shortness of breath: Secondary | ICD-10-CM | POA: Diagnosis not present

## 2020-01-18 DIAGNOSIS — E8881 Metabolic syndrome: Secondary | ICD-10-CM | POA: Diagnosis not present

## 2020-01-18 DIAGNOSIS — E7800 Pure hypercholesterolemia, unspecified: Secondary | ICD-10-CM | POA: Diagnosis not present

## 2020-01-18 DIAGNOSIS — E663 Overweight: Secondary | ICD-10-CM | POA: Diagnosis not present

## 2020-01-18 DIAGNOSIS — Z79899 Other long term (current) drug therapy: Secondary | ICD-10-CM | POA: Diagnosis not present

## 2020-01-20 DIAGNOSIS — Z87891 Personal history of nicotine dependence: Secondary | ICD-10-CM | POA: Diagnosis not present

## 2020-01-20 DIAGNOSIS — R7303 Prediabetes: Secondary | ICD-10-CM | POA: Diagnosis not present

## 2020-01-20 DIAGNOSIS — Z789 Other specified health status: Secondary | ICD-10-CM | POA: Diagnosis not present

## 2020-01-20 DIAGNOSIS — M25572 Pain in left ankle and joints of left foot: Secondary | ICD-10-CM | POA: Diagnosis not present

## 2020-02-10 DIAGNOSIS — R0602 Shortness of breath: Secondary | ICD-10-CM | POA: Diagnosis not present

## 2020-02-10 DIAGNOSIS — Z20822 Contact with and (suspected) exposure to covid-19: Secondary | ICD-10-CM | POA: Diagnosis not present

## 2020-02-10 DIAGNOSIS — I10 Essential (primary) hypertension: Secondary | ICD-10-CM | POA: Diagnosis not present

## 2020-02-16 DIAGNOSIS — R635 Abnormal weight gain: Secondary | ICD-10-CM | POA: Diagnosis not present

## 2020-02-16 DIAGNOSIS — R7303 Prediabetes: Secondary | ICD-10-CM | POA: Diagnosis not present

## 2020-02-16 DIAGNOSIS — E663 Overweight: Secondary | ICD-10-CM | POA: Diagnosis not present

## 2020-02-16 DIAGNOSIS — Z23 Encounter for immunization: Secondary | ICD-10-CM | POA: Diagnosis not present

## 2020-02-16 DIAGNOSIS — E7800 Pure hypercholesterolemia, unspecified: Secondary | ICD-10-CM | POA: Diagnosis not present

## 2020-02-21 ENCOUNTER — Other Ambulatory Visit: Payer: Self-pay | Admitting: Student in an Organized Health Care Education/Training Program

## 2020-02-21 DIAGNOSIS — I10 Essential (primary) hypertension: Secondary | ICD-10-CM

## 2020-03-09 NOTE — Progress Notes (Addendum)
Your procedure is scheduled on Thursday, March 16, 2020.  Report to [facility] Main Entrance "A" at 5:30 A.M., and check in at the Admitting office.  Call this number if you have problems the morning of surgery:  [phone]  Call [phone] if you have any questions prior to your surgery date Monday-Friday 8am-4pm    Remember:  Do not eat after midnight the night before your surgery  You may drink clear liquids until 4:30 AM the morning of your surgery.   Clear liquids allowed are: Water, Non-Citrus Juices (without pulp), Carbonated Beverages, Clear Tea, Black Coffee Only, and Gatorade     Take these medicines the morning of surgery with A SIP OF WATER:  carvedilol (COREG)  citalopram (CELEXA)  famotidine (PEPCID)  pantoprazole (PROTONIX)  IF NEEDED: albuterol (PROVENTIL HFA;VENTOLIN HFA)  cycloSPORINE (RESTASIS) 0.05 % ophthalmic emulsion HYDROcodone-acetaminophen (NORCO/VICODIN)   As of today, STOP taking any Aspirin (unless otherwise instructed by your surgeon) Aleve, Naproxen, Ibuprofen, Motrin, Advil, Goody's, BC's, all herbal medications, fish oil, and all vitamins.                      Do not wear jewelry, make up, or nail polish            Do not wear lotions, powders, perfumes, or deodorant.            Do not shave 48 hours prior to surgery.            Do not bring valuables to the hospital.            [facility] is not responsible for any belongings or valuables.  Do NOT Smoke (Tobacco/Vaping) or drink Alcohol 24 hours prior to your procedure If you use a CPAP at night, you may bring all equipment for your overnight stay.   Contacts, glasses, dentures or bridgework may not be worn into surgery.      For patients admitted to the hospital, discharge time will be determined by your treatment team.   Patients discharged the day of surgery will not be allowed to drive home, and someone needs to stay with them for 24 hours.    Special instructions:   [facility]- Preparing For Surgery  Before surgery, you can play an important role. Because skin is not sterile, your skin needs to be as free of germs as possible. You can reduce the number of germs on your skin by washing with CHG (chlorahexidine gluconate) Soap before surgery.  CHG is an antiseptic cleaner which kills germs and bonds with the skin to continue killing germs even after washing.    Oral Hygiene is also important to reduce your risk of infection.  Remember - BRUSH YOUR TEETH THE MORNING OF SURGERY WITH YOUR REGULAR TOOTHPASTE  Please do not use if you have an allergy to CHG or antibacterial soaps. If your skin becomes reddened/irritated stop using the CHG.  Do not shave (including legs and underarms) for at least 48 hours prior to first CHG shower. It is OK to shave your face.  Please follow these instructions carefully.   1. Shower the NIGHT BEFORE SURGERY and the MORNING OF SURGERY with CHG Soap.   2. If you chose to wash your hair, wash your hair first as usual with your normal shampoo.  3. After you shampoo, rinse your hair and body thoroughly to remove the shampoo.  4. Use CHG as you would any other liquid soap. You can apply  CHG directly to the skin and wash gently with a scrungie or a clean washcloth.   5. Apply the CHG Soap to your body ONLY FROM THE NECK DOWN.  Do not use on open wounds or open sores. Avoid contact with your eyes, ears, mouth and genitals (private parts). Wash Face and genitals (private parts)  with your normal soap.   6. Wash thoroughly, paying special attention to the area where your surgery will be performed.  7. Thoroughly rinse your body with warm water from the neck down.  8. DO NOT shower/wash with your normal soap after using and rinsing off the CHG Soap.  9. Pat yourself dry with a CLEAN TOWEL.  10. Wear CLEAN PAJAMAS to bed the night before surgery  11. Place CLEAN SHEETS on your bed the night of your first shower and DO NOT SLEEP WITH  PETS.   Day of Surgery: Wear Clean/Comfortable clothing the morning of surgery Do not apply any deodorants/lotions.   Remember to brush your teeth WITH YOUR REGULAR TOOTHPASTE.   Please read over the following fact sheets that you were given.

## 2020-03-10 ENCOUNTER — Other Ambulatory Visit: Payer: Self-pay

## 2020-03-10 ENCOUNTER — Encounter (HOSPITAL_COMMUNITY): Payer: Self-pay

## 2020-03-10 ENCOUNTER — Encounter (HOSPITAL_COMMUNITY)
Admission: RE | Admit: 2020-03-10 | Discharge: 2020-03-10 | Disposition: A | Discharge status: Home / Self Care | Payer: Medicare HMO | Source: Ambulatory Visit | Attending: Surgery | Admitting: Surgery

## 2020-03-10 DIAGNOSIS — Z01818 Encounter for other preprocedural examination: Secondary | ICD-10-CM | POA: Diagnosis not present

## 2020-03-10 HISTORY — DX: Unspecified osteoarthritis, unspecified site: M19.90

## 2020-03-10 HISTORY — DX: Anxiety disorder, unspecified: F41.9

## 2020-03-10 HISTORY — DX: Depression, unspecified: F32.A

## 2020-03-10 HISTORY — DX: Prediabetes: R73.03

## 2020-03-10 HISTORY — DX: Unspecified asthma, uncomplicated: J45.909

## 2020-03-10 HISTORY — DX: Headache, unspecified: R51.9

## 2020-03-10 LAB — COMPREHENSIVE METABOLIC PANEL
ALT: 10 U/L (ref 0–44)
AST: 16 U/L (ref 15–41)
Albumin: 3.4 g/dL — ABNORMAL LOW (ref 3.5–5.0)
Alkaline Phosphatase: 83 U/L (ref 38–126)
Anion gap: 9 (ref 5–15)
BUN: 10 mg/dL (ref 6–20)
CO2: 26 mmol/L (ref 22–32)
Calcium: 9.3 mg/dL (ref 8.9–10.3)
Chloride: 104 mmol/L (ref 98–111)
Creatinine, Ser: 1 mg/dL (ref 0.44–1.00)
GFR, Estimated: >60 mL/min (ref >60)
Glucose, Bld: 87 mg/dL (ref 70–99)
Potassium: 4.8 mmol/L (ref 3.5–5.1)
Sodium: 139 mmol/L (ref 135–145)
Total Bilirubin: 0.6 mg/dL (ref 0.3–1.2)
Total Protein: 6.9 g/dL (ref 6.5–8.1)

## 2020-03-10 LAB — CBC
HCT: 42.5 % (ref 36.0–46.0)
Hemoglobin: 13.1 g/dL (ref 12.0–15.0)
MCH: 26.8 pg (ref 26.0–34.0)
MCHC: 30.8 g/dL (ref 30.0–36.0)
MCV: 87.1 fL (ref 80.0–100.0)
Platelets: 257 10*3/uL (ref 150–400)
RBC: 4.88 MIL/uL (ref 3.87–5.11)
RDW: 15.9 % — ABNORMAL HIGH (ref 11.5–15.5)
WBC: 6.8 10*3/uL (ref 4.0–10.5)
nRBC: 0 % (ref 0.0–0.2)

## 2020-03-10 LAB — GLUCOSE, CAPILLARY: Glucose-Capillary: 89 mg/dL (ref 70–99)

## 2020-03-10 NOTE — Progress Notes (Signed)
PCP - Dr. Vira Browns Cardiologist - Denies  PPM/ICD - Denies  Chest x-ray - N/A EKG - 03/10/20 Stress Test - Denies ECHO - Denies Cardiac Cath - Denies  Sleep Study - Yes CPAP - No  Patient states that she is pre-diabetic; doesn't check her blood sugars.  Blood Thinner Instructions: N/A Aspirin Instructions: Patient instructed to call surgeon's office for instructions.  ERAS Protcol - Yes PRE-SURGERY Ensure or G2- No  COVID TEST- 03/13/20   Patient denies shortness of breath, fever, cough and chest pain at PAT appointment   All instructions explained to the patient, with a verbal understanding of the material. Patient agrees to go over the instructions while at home for a better understanding. Patient also instructed to self quarantine after being tested for COVID-19. The opportunity to ask questions was provided.

## 2020-03-11 LAB — HEMOGLOBIN A1C
Hgb A1c MFr Bld: 6.1 % — ABNORMAL HIGH (ref 4.8–5.6)
Mean Plasma Glucose: 128 mg/dL

## 2020-03-13 ENCOUNTER — Other Ambulatory Visit (HOSPITAL_COMMUNITY)
Admission: RE | Admit: 2020-03-13 | Discharge: 2020-03-13 | Disposition: A | Discharge status: Home / Self Care | Payer: Medicare HMO | Source: Ambulatory Visit | Attending: Surgery | Admitting: Surgery

## 2020-03-13 DIAGNOSIS — Z20822 Contact with and (suspected) exposure to covid-19: Secondary | ICD-10-CM | POA: Insufficient documentation

## 2020-03-13 DIAGNOSIS — Z01812 Encounter for preprocedural laboratory examination: Secondary | ICD-10-CM | POA: Diagnosis present

## 2020-03-13 LAB — SARS CORONAVIRUS 2 (TAT 6-24 HRS): SARS Coronavirus 2: NEGATIVE

## 2020-03-16 ENCOUNTER — Ambulatory Visit (HOSPITAL_COMMUNITY): Payer: Medicare HMO | Admitting: Certified Registered"

## 2020-03-16 ENCOUNTER — Encounter (HOSPITAL_COMMUNITY): Payer: Self-pay | Admitting: Surgery

## 2020-03-16 ENCOUNTER — Other Ambulatory Visit: Payer: Self-pay

## 2020-03-16 ENCOUNTER — Ambulatory Visit (HOSPITAL_COMMUNITY): Payer: Medicare HMO | Admitting: Vascular Surgery

## 2020-03-16 ENCOUNTER — Encounter (HOSPITAL_COMMUNITY)
Admission: RE | Disposition: A | Discharge status: Home / Self Care | Payer: Self-pay | Source: Home / Self Care | Attending: Surgery

## 2020-03-16 ENCOUNTER — Ambulatory Visit (HOSPITAL_COMMUNITY)
Admission: RE | Admit: 2020-03-16 | Discharge: 2020-03-16 | Disposition: A | Discharge status: Home / Self Care | Payer: Medicare HMO | Attending: Surgery | Admitting: Surgery

## 2020-03-16 ENCOUNTER — Ambulatory Visit (HOSPITAL_COMMUNITY): Payer: Medicare HMO

## 2020-03-16 DIAGNOSIS — I10 Essential (primary) hypertension: Secondary | ICD-10-CM | POA: Diagnosis not present

## 2020-03-16 DIAGNOSIS — Z791 Long term (current) use of non-steroidal anti-inflammatories (NSAID): Secondary | ICD-10-CM | POA: Insufficient documentation

## 2020-03-16 DIAGNOSIS — Z79899 Other long term (current) drug therapy: Secondary | ICD-10-CM | POA: Insufficient documentation

## 2020-03-16 DIAGNOSIS — K8010 Calculus of gallbladder with chronic cholecystitis without obstruction: Secondary | ICD-10-CM | POA: Insufficient documentation

## 2020-03-16 DIAGNOSIS — F32A Depression, unspecified: Secondary | ICD-10-CM | POA: Insufficient documentation

## 2020-03-16 DIAGNOSIS — Z87891 Personal history of nicotine dependence: Secondary | ICD-10-CM | POA: Insufficient documentation

## 2020-03-16 DIAGNOSIS — Z880 Allergy status to penicillin: Secondary | ICD-10-CM | POA: Diagnosis not present

## 2020-03-16 DIAGNOSIS — Z8249 Family history of ischemic heart disease and other diseases of the circulatory system: Secondary | ICD-10-CM | POA: Diagnosis not present

## 2020-03-16 DIAGNOSIS — Z419 Encounter for procedure for purposes other than remedying health state, unspecified: Secondary | ICD-10-CM

## 2020-03-16 DIAGNOSIS — K219 Gastro-esophageal reflux disease without esophagitis: Secondary | ICD-10-CM | POA: Insufficient documentation

## 2020-03-16 DIAGNOSIS — K828 Other specified diseases of gallbladder: Secondary | ICD-10-CM | POA: Insufficient documentation

## 2020-03-16 DIAGNOSIS — F419 Anxiety disorder, unspecified: Secondary | ICD-10-CM | POA: Insufficient documentation

## 2020-03-16 DIAGNOSIS — Z8261 Family history of arthritis: Secondary | ICD-10-CM | POA: Insufficient documentation

## 2020-03-16 HISTORY — PX: CHOLECYSTECTOMY: SHX55

## 2020-03-16 SURGERY — LAPAROSCOPIC CHOLECYSTECTOMY WITH INTRAOPERATIVE CHOLANGIOGRAM
Anesthesia: General | Site: Abdomen

## 2020-03-16 MED ORDER — LIDOCAINE 2% (20 MG/ML) 5 ML SYRINGE
INTRAMUSCULAR | Status: DC | PRN
  Administered 2020-03-16: 40 mg via INTRAVENOUS

## 2020-03-16 MED ORDER — FENTANYL CITRATE (PF) 100 MCG/2ML IJ SOLN
25.0000 ug | INTRAMUSCULAR | Status: DC | PRN
  Administered 2020-03-16 (×2): 25 ug via INTRAVENOUS

## 2020-03-16 MED ORDER — ACETAMINOPHEN 10 MG/ML IV SOLN
INTRAVENOUS | Status: AC
  Filled 2020-03-16: qty 100

## 2020-03-16 MED ORDER — CHLORHEXIDINE GLUCONATE CLOTH 2 % EX PADS: 6.0000 | MEDICATED_PAD | Freq: Once | CUTANEOUS | Status: DC

## 2020-03-16 MED ORDER — SODIUM CHLORIDE 0.9 % IV SOLN
INTRAVENOUS | Status: DC | PRN
  Administered 2020-03-16: 4 mL

## 2020-03-16 MED ORDER — PHENYLEPHRINE 40 MCG/ML (10ML) SYRINGE FOR IV PUSH (FOR BLOOD PRESSURE SUPPORT)
PREFILLED_SYRINGE | INTRAVENOUS | Status: DC | PRN
  Administered 2020-03-16: 80 ug via INTRAVENOUS

## 2020-03-16 MED ORDER — OXYCODONE HCL 5 MG PO TABS
5.0000 mg | ORAL_TABLET | Freq: Four times a day (QID) | ORAL | 0 refills | Status: DC | PRN
Start: 2020-03-16 — End: 2020-03-16

## 2020-03-16 MED ORDER — GABAPENTIN 300 MG PO CAPS
300.0000 mg | ORAL_CAPSULE | ORAL | Status: AC
  Administered 2020-03-16: 300 mg via ORAL
  Filled 2020-03-16: qty 1

## 2020-03-16 MED ORDER — PHENYLEPHRINE HCL-NACL 10-0.9 MG/250ML-% IV SOLN
INTRAVENOUS | Status: AC
  Filled 2020-03-16: qty 500

## 2020-03-16 MED ORDER — ONDANSETRON HCL 4 MG/2ML IJ SOLN
INTRAMUSCULAR | Status: DC | PRN
  Administered 2020-03-16: 4 mg via INTRAVENOUS

## 2020-03-16 MED ORDER — CEFAZOLIN SODIUM-DEXTROSE 2-4 GM/100ML-% IV SOLN
INTRAVENOUS | Status: AC
  Filled 2020-03-16: qty 100

## 2020-03-16 MED ORDER — KETAMINE HCL 50 MG/5ML IJ SOSY
PREFILLED_SYRINGE | INTRAMUSCULAR | Status: AC
  Filled 2020-03-16: qty 5

## 2020-03-16 MED ORDER — PHENYLEPHRINE HCL-NACL 10-0.9 MG/250ML-% IV SOLN: INTRAVENOUS | Status: DC | PRN

## 2020-03-16 MED ORDER — PROPOFOL 10 MG/ML IV BOLUS
INTRAVENOUS | Status: AC
  Filled 2020-03-16: qty 20

## 2020-03-16 MED ORDER — 0.9 % SODIUM CHLORIDE (POUR BTL) OPTIME
TOPICAL | Status: DC | PRN
  Administered 2020-03-16: 1000 mL

## 2020-03-16 MED ORDER — MIDAZOLAM HCL 5 MG/5ML IJ SOLN
INTRAMUSCULAR | Status: DC | PRN
  Administered 2020-03-16: 2 mg via INTRAVENOUS

## 2020-03-16 MED ORDER — HYDRALAZINE HCL 20 MG/ML IJ SOLN
10.0000 mg | Freq: Once | INTRAMUSCULAR | Status: AC
  Administered 2020-03-16: 10 mg via INTRAVENOUS

## 2020-03-16 MED ORDER — ACETAMINOPHEN 160 MG/5ML PO SOLN: 1000.0000 mg | Freq: Once | ORAL | Status: DC | PRN

## 2020-03-16 MED ORDER — LACTATED RINGERS IV SOLN: INTRAVENOUS | Status: DC

## 2020-03-16 MED ORDER — ACETAMINOPHEN 10 MG/ML IV SOLN: 1000.0000 mg | Freq: Once | INTRAVENOUS | Status: DC | PRN

## 2020-03-16 MED ORDER — CHLORHEXIDINE GLUCONATE 0.12 % MT SOLN
15.0000 mL | Freq: Once | OROMUCOSAL | Status: AC
  Administered 2020-03-16: 15 mL via OROMUCOSAL
  Filled 2020-03-16: qty 15

## 2020-03-16 MED ORDER — OXYCODONE HCL 5 MG PO TABS
5.0000 mg | ORAL_TABLET | Freq: Four times a day (QID) | ORAL | 0 refills | Status: DC | PRN
Start: 2020-03-16 — End: 2020-12-07

## 2020-03-16 MED ORDER — HYDRALAZINE HCL 20 MG/ML IJ SOLN
INTRAMUSCULAR | Status: AC
  Filled 2020-03-16: qty 1

## 2020-03-16 MED ORDER — CEFAZOLIN SODIUM-DEXTROSE 2-4 GM/100ML-% IV SOLN
2.0000 g | INTRAVENOUS | Status: AC
  Administered 2020-03-16: 2 g via INTRAVENOUS

## 2020-03-16 MED ORDER — FENTANYL CITRATE (PF) 100 MCG/2ML IJ SOLN
INTRAMUSCULAR | Status: DC | PRN
  Administered 2020-03-16: 100 ug via INTRAVENOUS

## 2020-03-16 MED ORDER — ROCURONIUM BROMIDE 10 MG/ML (PF) SYRINGE
PREFILLED_SYRINGE | INTRAVENOUS | Status: DC | PRN
  Administered 2020-03-16: 60 mg via INTRAVENOUS

## 2020-03-16 MED ORDER — SODIUM CHLORIDE 0.9 % IR SOLN
Status: DC | PRN
  Administered 2020-03-16: 1000 mL

## 2020-03-16 MED ORDER — BUPIVACAINE HCL (PF) 0.25 % IJ SOLN
INTRAMUSCULAR | Status: AC
  Filled 2020-03-16: qty 30

## 2020-03-16 MED ORDER — FENTANYL CITRATE (PF) 250 MCG/5ML IJ SOLN
INTRAMUSCULAR | Status: AC
  Filled 2020-03-16: qty 5

## 2020-03-16 MED ORDER — OXYCODONE HCL 5 MG PO TABS: 5.0000 mg | ORAL_TABLET | Freq: Once | ORAL | Status: DC | PRN

## 2020-03-16 MED ORDER — BUPIVACAINE-EPINEPHRINE 0.25% -1:200000 IJ SOLN
INTRAMUSCULAR | Status: DC | PRN
  Administered 2020-03-16: 14 mL

## 2020-03-16 MED ORDER — GLYCOPYRROLATE PF 0.2 MG/ML IJ SOSY
PREFILLED_SYRINGE | INTRAMUSCULAR | Status: DC | PRN
  Administered 2020-03-16 (×2): .1 mg via INTRAVENOUS

## 2020-03-16 MED ORDER — ORAL CARE MOUTH RINSE: 15.0000 mL | Freq: Once | OROMUCOSAL | Status: AC

## 2020-03-16 MED ORDER — DEXAMETHASONE SODIUM PHOSPHATE 10 MG/ML IJ SOLN
INTRAMUSCULAR | Status: DC | PRN
  Administered 2020-03-16: 10 mg via INTRAVENOUS

## 2020-03-16 MED ORDER — SUGAMMADEX SODIUM 200 MG/2ML IV SOLN
INTRAVENOUS | Status: DC | PRN
  Administered 2020-03-16: 300 mg via INTRAVENOUS

## 2020-03-16 MED ORDER — ACETAMINOPHEN 500 MG PO TABS: 1000.0000 mg | ORAL_TABLET | Freq: Once | ORAL | Status: DC | PRN

## 2020-03-16 MED ORDER — FENTANYL CITRATE (PF) 100 MCG/2ML IJ SOLN
INTRAMUSCULAR | Status: AC
  Filled 2020-03-16: qty 2

## 2020-03-16 MED ORDER — OXYCODONE HCL 5 MG/5ML PO SOLN: 5.0000 mg | Freq: Once | ORAL | Status: DC | PRN

## 2020-03-16 MED ORDER — PROPOFOL 10 MG/ML IV BOLUS
INTRAVENOUS | Status: DC | PRN
  Administered 2020-03-16: 130 mg via INTRAVENOUS

## 2020-03-16 MED ORDER — MIDAZOLAM HCL 2 MG/2ML IJ SOLN
INTRAMUSCULAR | Status: AC
  Filled 2020-03-16: qty 2

## 2020-03-16 MED ORDER — SODIUM CHLORIDE (PF) 0.9 % IJ SOLN
INTRAMUSCULAR | Status: AC
  Filled 2020-03-16: qty 50

## 2020-03-16 MED ORDER — KETAMINE HCL 10 MG/ML IJ SOLN
INTRAMUSCULAR | Status: DC | PRN
  Administered 2020-03-16: 30 mg via INTRAVENOUS

## 2020-03-16 MED ORDER — HEMOSTATIC AGENTS (NO CHARGE) OPTIME
TOPICAL | Status: DC | PRN
  Administered 2020-03-16: 1 via TOPICAL

## 2020-03-16 MED ORDER — ACETAMINOPHEN 500 MG PO TABS
1000.0000 mg | ORAL_TABLET | ORAL | Status: AC
  Administered 2020-03-16: 1000 mg via ORAL
  Filled 2020-03-16: qty 2

## 2020-03-16 SURGICAL SUPPLY — 47 items
APPLIER CLIP ROT 10 11.4 M/L (STAPLE) ×3
BENZOIN TINCTURE PRP APPL 2/3 (GAUZE/BANDAGES/DRESSINGS) ×3 IMPLANT
BLADE CLIPPER SURG (BLADE) IMPLANT
CANISTER SUCT 3000ML PPV (MISCELLANEOUS) ×3 IMPLANT
CHLORAPREP W/TINT 26 (MISCELLANEOUS) ×3 IMPLANT
CLIP APPLIE ROT 10 11.4 M/L (STAPLE) ×1 IMPLANT
CLOSURE WOUND 1/2 X4 (GAUZE/BANDAGES/DRESSINGS) ×1
COVER MAYO STAND STRL (DRAPES) ×3 IMPLANT
COVER SURGICAL LIGHT HANDLE (MISCELLANEOUS) ×3 IMPLANT
COVER WAND RF STERILE (DRAPES) ×3 IMPLANT
DRAPE C-ARM 42X120 X-RAY (DRAPES) ×3 IMPLANT
DRSG TEGADERM 2-3/8X2-3/4 SM (GAUZE/BANDAGES/DRESSINGS) ×9 IMPLANT
DRSG TEGADERM 4X4.75 (GAUZE/BANDAGES/DRESSINGS) ×3 IMPLANT
ELECT REM PT RETURN 9FT ADLT (ELECTROSURGICAL) ×3
ELECTRODE REM PT RTRN 9FT ADLT (ELECTROSURGICAL) ×1 IMPLANT
GAUZE SPONGE 2X2 8PLY STRL LF (GAUZE/BANDAGES/DRESSINGS) ×1 IMPLANT
GLOVE BIO SURGEON STRL SZ7 (GLOVE) ×3 IMPLANT
GLOVE BIOGEL PI IND STRL 7.5 (GLOVE) ×1 IMPLANT
GLOVE BIOGEL PI INDICATOR 7.5 (GLOVE) ×2
GOWN STRL REUS W/ TWL LRG LVL3 (GOWN DISPOSABLE) ×3 IMPLANT
GOWN STRL REUS W/TWL LRG LVL3 (GOWN DISPOSABLE) ×9
HEMOSTAT SNOW SURGICEL 2X4 (HEMOSTASIS) ×3 IMPLANT
IV CATH AUTO 14GX1.75 SAFE ORG (IV SOLUTION) ×3 IMPLANT
KIT BASIN OR (CUSTOM PROCEDURE TRAY) ×3 IMPLANT
KIT TURNOVER KIT B (KITS) ×3 IMPLANT
NS IRRIG 1000ML POUR BTL (IV SOLUTION) ×3 IMPLANT
PAD ARMBOARD 7.5X6 YLW CONV (MISCELLANEOUS) ×3 IMPLANT
POUCH RETRIEVAL ECOSAC 10 (ENDOMECHANICALS) IMPLANT
POUCH RETRIEVAL ECOSAC 10MM (ENDOMECHANICALS)
POUCH SPECIMEN RETRIEVAL 10MM (ENDOMECHANICALS) IMPLANT
SCISSORS LAP 5X35 DISP (ENDOMECHANICALS) ×3 IMPLANT
SET CHOLANGIOGRAPH 5 50 .035 (SET/KITS/TRAYS/PACK) ×3 IMPLANT
SET IRRIG TUBING LAPAROSCOPIC (IRRIGATION / IRRIGATOR) ×3 IMPLANT
SET TUBE SMOKE EVAC HIGH FLOW (TUBING) ×3 IMPLANT
SLEEVE ENDOPATH XCEL 5M (ENDOMECHANICALS) ×3 IMPLANT
SPECIMEN JAR SMALL (MISCELLANEOUS) ×3 IMPLANT
SPONGE GAUZE 2X2 STER 10/PKG (GAUZE/BANDAGES/DRESSINGS) ×2
STOPCOCK 4 WAY LG BORE MALE ST (IV SETS) ×3 IMPLANT
STRIP CLOSURE SKIN 1/2X4 (GAUZE/BANDAGES/DRESSINGS) ×2 IMPLANT
SUT MNCRL AB 4-0 PS2 18 (SUTURE) ×3 IMPLANT
TOWEL GREEN STERILE (TOWEL DISPOSABLE) ×3 IMPLANT
TOWEL GREEN STERILE FF (TOWEL DISPOSABLE) ×3 IMPLANT
TRAY LAPAROSCOPIC MC (CUSTOM PROCEDURE TRAY) ×3 IMPLANT
TROCAR XCEL BLUNT TIP 100MML (ENDOMECHANICALS) ×3 IMPLANT
TROCAR XCEL NON-BLD 11X100MML (ENDOMECHANICALS) ×3 IMPLANT
TROCAR XCEL NON-BLD 5MMX100MML (ENDOMECHANICALS) ×3 IMPLANT
WATER STERILE IRR 1000ML POUR (IV SOLUTION) ×3 IMPLANT

## 2020-03-16 NOTE — Transfer of Care (Signed)
Immediate Anesthesia Transfer of Care Note  Patient: Kristen [name]  Procedure(s) Performed: LAPAROSCOPIC CHOLECYSTECTOMY WITH INTRAOPERATIVE CHOLANGIOGRAM (N/A Abdomen)  Patient Location: PACU  Anesthesia Type:General  Level of Consciousness: awake, alert , oriented and patient cooperative  Airway & Oxygen Therapy: Patient Spontanous Breathing and Patient connected to face mask oxygen  Post-op Assessment: Report given to RN and Post -op Vital signs reviewed and stable  Post vital signs: Reviewed and stable  Last Vitals:  Vitals Value Taken Time  BP 201/106 03/16/20 0908  Temp    Pulse 59 03/16/20 0909  Resp 27 03/16/20 0909  SpO2 100 % 03/16/20 0909  Vitals shown include unvalidated device data.  Last Pain:  Vitals:   03/16/20 0551  TempSrc: Oral         Complications: No complications documented.

## 2020-03-16 NOTE — H&P (Signed)
History of Present Illness The patient is a 59 year old female who presents for evaluation of gall stones. PCP - Doristine Mango, MD  This is a 59 year old female who presents with several month history of right or quadrant abdominal pain. This pain is exacerbated by eating. This is associated with nausea, diarrhea, bloating, and radiation up to her right shoulder. She had a CT scan in 2020 that showed the presence of small gallstones. She was not referred at that time. Subsequently, she transferred her care to [facility]. She has had 2 ultrasounds, a colonoscopy, and a CT scan. This confirms the presence of gallstones. She presents now to discuss surgery. She is on disability after back surgery with chronic pain in her legs.  CLINICAL DATA: Unintended weight loss  EXAM: CT ABDOMEN AND PELVIS WITH CONTRAST  TECHNIQUE: Multidetector CT imaging of the abdomen and pelvis was performed using the standard protocol following bolus administration of intravenous contrast.  CONTRAST: 159mL ISOVUE-300 IOPAMIDOL (ISOVUE-300) INJECTION 61%  COMPARISON: Ultrasound 07/11/2017  FINDINGS: Lower chest: Lung bases demonstrate no acute consolidation or effusion. The heart size is normal  Hepatobiliary: Calcified stone or small polyp in the gallbladder fundus. No biliary dilatation. No focal hepatic abnormality  Pancreas: Prominence of the pancreatic duct. No inflammatory change  Spleen: Normal in size without focal abnormality.  Adrenals/Urinary Tract: Adrenal glands are unremarkable. Kidneys are normal, without renal calculi, focal lesion, or hydronephrosis. Bladder is unremarkable.  Stomach/Bowel: Stomach is within normal limits. Appendix appears normal. No evidence of bowel wall thickening, distention, or inflammatory changes. Diverticular disease of the sigmoid colon without acute inflammatory change  Vascular/Lymphatic: Mild aortic atherosclerosis without aneurysm.  No significantly enlarged lymph nodes.  Reproductive: Uterus and bilateral adnexa are unremarkable.  Other: Negative for free air or free fluid.  Musculoskeletal: Post laminectomy changes at L3-L4 and L4-L5. Grade 1 anterolisthesis L4 on L5. Advanced degenerative change at L5-S1.  IMPRESSION: 1. No CT evidence for acute intra-abdominal or pelvic abnormality. 2. Small stone or polyp in the gallbladder fundus 3. Prominent pancreatic duct without discrete mass or inflammatory change 4. Diverticular disease of the sigmoid colon without acute inflammatory change   Electronically Signed By: Donavan Foil M.D. On: 10/15/2018 02:26   Problem List/Past Medical  CHRONIC CHOLECYSTITIS WITH CALCULUS (K80.10)  Past Surgical History  Mammoplasty; Reduction Bilateral. Spinal Surgery Midback  Diagnostic Studies History Colonoscopy 1-5 years ago Mammogram within last year Pap Smear 1-5 years ago  Allergies  Penicillin G Potassium *PENICILLINS* Allergies Reconciled  Medication History  Carvedilol (25MG  Tablet, Oral) Active. Gabapentin (300MG  Capsule, Oral) Active. Losartan Potassium (50MG  Tablet, Oral) Active. Omeprazole (40MG  Capsule DR, Oral) Active. traZODone HCl (50MG  Tablet, Oral) Active. Advil (Oral) Specific strength unknown - Active. Loratadine (10MG  Capsule, Oral) Active. Albuterol Sulfate (Inhalation) Specific strength unknown - Active. Medications Reconciled  Social History  Caffeine use Tea. Illicit drug use Prefer to discuss with provider. No alcohol use Tobacco use Former smoker.  Family History Arthritis Brother, Father. Bleeding disorder Father. Heart Disease Mother.  Pregnancy / Birth History  Age at menarche 73 years. Age of menopause <46 Gravida 5 Length (months) of breastfeeding 3-6 Maternal age 70-20 Para 3  Other Problems  Arthritis Back Pain Cholelithiasis Gastroesophageal Reflux  Disease High blood pressure    Vitals  Weight: 157.13 lb Height: 63in Body Surface Area: 1.75 m Body Mass Index: 27.83 kg/m  Temp.: 97.52F  Pulse: 92 (Regular)  P.OX: 99% (Room air) BP: 124/82(Sitting, Left Arm, Standard)  Physical Exam   The physical exam findings are as follows: Note:Constitutional: WDWN in NAD, conversant, no obvious deformities; resting comfortably Eyes: Pupils equal, round; sclera anicteric; moist conjunctiva; no lid lag HENT: Oral mucosa moist; good dentition Neck: No masses palpated, trachea midline; no thyromegaly Lungs: CTA bilaterally; normal respiratory effort CV: Regular rate and rhythm; no murmurs; extremities well-perfused with no edema Abd: +bowel sounds, soft, mild RUQ tenderness, no palpable organomegaly; no palpable hernias Musc: Normal gait; no apparent clubbing or cyanosis in extremities Lymphatic: No palpable cervical or axillary lymphadenopathy Skin: Warm, dry; no sign of jaundice Psychiatric - alert and oriented x 4; calm mood and affect    Assessment & Plan   CHRONIC CHOLECYSTITIS WITH CALCULUS (K80.10)  Current Plans Schedule for Surgery - Laparoscopic cholecystectomy with intraoperative cholangiogram. The surgical procedure has been discussed with the patient. Potential risks, benefits, alternative treatments, and expected outcomes have been explained. All of the patient's questions at this time have been answered. The likelihood of reaching the patient's treatment goal is good. The patient understand the proposed surgical procedure and wishes to proceed.  Imogene Burn. Georgette Dover, MD, [facility] Surgery  General/ Trauma Surgery   03/16/2020 7:01 AM

## 2020-03-16 NOTE — Op Note (Addendum)
Laparoscopic Cholecystectomy with IOC Procedure Note  Indications: This patient presents with symptomatic gallbladder disease and will undergo laparoscopic cholecystectomy.  Pre-operative Diagnosis: Chronic calculus cholecystitis  Post-operative Diagnosis: Same  Surgeon: Maia Petties, MD  Assistants: Ahmed Prima, MD [facility]  Anesthesia: General endotracheal anesthesia  ASA Class: 2  Procedure Details  The patient was seen again in the Holding Room. The risks, benefits, complications, treatment options, and expected outcomes were discussed with the patient. The possibilities of reaction to medication, pulmonary aspiration, perforation of viscus, bleeding, recurrent infection, finding a normal gallbladder, the need for additional procedures, failure to diagnose a condition, the possible need to convert to an open procedure, and creating a complication requiring transfusion or operation were discussed with the patient. The likelihood of improving the patient's symptoms with return to their baseline status is good.  The patient and/or family concurred with the proposed plan, giving informed consent. The site of surgery properly noted. The patient was taken to Operating Room, identified as Kristen [name] and the procedure verified as Laparoscopic Cholecystectomy with Intraoperative Cholangiogram. A Time Out was held and the above information confirmed.  Prior to the induction of general anesthesia, antibiotic prophylaxis was administered. General endotracheal anesthesia was then administered and tolerated well. After the induction, the abdomen was prepped with Chloraprep and draped in the sterile fashion. The patient was positioned in the supine position.  Local anesthetic agent was injected into the skin near the umbilicus and an incision made. We dissected down to the abdominal fascia with blunt dissection.  The fascia was incised vertically and we entered the peritoneal cavity  bluntly.  A pursestring suture of 0-Vicryl was placed around the fascial opening.  The Hasson cannula was inserted and secured with the stay suture.  Pneumoperitoneum was then created with CO2 and tolerated well without any adverse changes in the patient's vital signs. An 11-mm port was placed in the subxiphoid position.  Two 5-mm ports were placed in the right upper quadrant. All skin incisions were infiltrated with a local anesthetic agent before making the incision and placing the trocars.   We positioned the patient in reverse Trendelenburg, tilted slightly to the patient's left.  The gallbladder was identified, the fundus grasped and retracted cephalad. There were some flimsy adhesions to the gallbladder, which appeared distended and minimally thickened.  Adhesions were lysed bluntly and with the electrocautery where indicated, taking care not to injure any adjacent organs or viscus. The infundibulum was grasped and retracted laterally, exposing the peritoneum overlying the triangle of Calot. This was then divided and exposed in a blunt fashion. A critical view of the cystic duct and cystic artery was obtained.  The cystic duct was clearly identified and bluntly dissected circumferentially. The cystic duct was ligated with a clip distally.  An incision was made in the cystic duct and the cholangiogram catheter introduced. The catheter was secured using a clip. A cholangiogram was then obtained which showed good visualization of the distal and proximal biliary tree with no sign of filling defects or obstruction.  Contrast flowed into the duodenum. The catheter was then removed.   The cystic duct was then ligated with clips and divided. The cystic artery was identified, dissected free, ligated with clips and divided as well.   The gallbladder was dissected from the liver bed in retrograde fashion with the electrocautery. The gallbladder was removed and placed in an Endocatch sac. The liver bed was irrigated  and inspected. Hemostasis was achieved with the electrocautery.  Copious irrigation was utilized and was repeatedly aspirated until clear.  The gallbladder and Endocatch sac were then removed through the umbilical port site.  The pursestring suture was used to close the umbilical fascia.    We again inspected the right upper quadrant for hemostasis. A Surgicel snow sheet was placed in the gallbladder fossa. Pneumoperitoneum was released as we removed the trocars.  4-0 Monocryl was used to close the skin.   Benzoin, steri-strips, and clean dressings were applied. The patient was then extubated and brought to the recovery room in stable condition. Instrument, sponge, and needle counts were correct at closure and at the conclusion of the case.   Findings: Cholecystitis with Cholelithiasis  Estimated Blood Loss: less than 50 mL         Drains: none         Specimens: Gallbladder           Complications: None; patient tolerated the procedure well.         Disposition: PACU - hemodynamically stable.         Condition: stable  Ahmed Prima, MD [facility] PGY 5  General Surgery  I was present for the critical and key portions of the surgery and I was immediately available throughout the enitre procedure.  I have reviewed and agree with the operative note as documented by the resident.  Imogene Burn. Georgette Dover, MD, [facility] Surgery  General/ Trauma Surgery   03/16/2020 9:19 AM

## 2020-03-16 NOTE — Discharge Instructions (Signed)
[facility] ______[facility] °LAPAROSCOPIC SURGERY: POST OP INSTRUCTIONS °Always review your discharge instruction sheet given to you by the facility where your surgery was performed. °IF YOU HAVE DISABILITY OR FAMILY LEAVE FORMS, YOU MUST BRING THEM TO THE OFFICE FOR PROCESSING.   °DO NOT GIVE THEM TO YOUR DOCTOR. ° °1. A prescription for pain medication may be given to you upon discharge.  Take your pain medication as prescribed, if needed.  If narcotic pain medicine is not needed, then you may take acetaminophen (Tylenol) or ibuprofen (Advil) as needed. °2. Take your usually prescribed medications unless otherwise directed. °3. If you need a refill on your pain medication, please contact your pharmacy.  They will contact our office to request authorization. Prescriptions will not be filled after 5pm or on week-ends. °4. You should follow a light diet the first few days after arrival home, such as soup and crackers, etc.  Be sure to include lots of fluids daily. °5. Most patients will experience some swelling and bruising in the area of the incisions.  Ice packs will help.  Swelling and bruising can take several days to resolve.  °6. It is common to experience some constipation if taking pain medication after surgery.  Increasing fluid intake and taking a stool softener (such as Colace) will usually help or prevent this problem from occurring.  A mild laxative (Milk of Magnesia or Miralax) should be taken according to package instructions if there are no bowel movements after 48 hours. °7. Unless discharge instructions indicate otherwise, you may remove your bandages 24-48 hours after surgery, and you may shower at that time.  You may have steri-strips (small skin tapes) in place directly over the incision.  These strips should be left on the skin for 7-10 days.  If your surgeon used skin glue on the incision, you may shower in 24 hours.  The glue will flake off over the next 2-3 weeks.  Any sutures or  staples will be removed at the office during your follow-up visit. °8. ACTIVITIES:  You may resume regular (light) daily activities beginning the next day--such as daily self-care, walking, climbing stairs--gradually increasing activities as tolerated.  You may have sexual intercourse when it is comfortable.  Refrain from any heavy lifting or straining until approved by your doctor. °a. You may drive when you are no longer taking prescription pain medication, you can comfortably wear a seatbelt, and you can safely maneuver your car and apply brakes. °b. RETURN TO WORK:  __________________________________________________________ °9. You should see your doctor in the office for a follow-up appointment approximately 2-3 weeks after your surgery.  Make sure that you call for this appointment within a day or two after you arrive home to insure a convenient appointment time. °10. OTHER INSTRUCTIONS: __________________________________________________________________________________________________________________________ __________________________________________________________________________________________________________________________ °WHEN TO CALL YOUR DOCTOR: °1. Fever over 101.0 °2. Inability to urinate °3. Continued bleeding from incision. °4. Increased pain, redness, or drainage from the incision. °5. Increasing abdominal pain ° °The clinic staff is available to answer your questions during regular business hours.  Please don’t hesitate to call and ask to speak to one of the nurses for clinical concerns.  If you have a medical emergency, go to the nearest emergency room or call 911.  A surgeon from [facility] is always on call at the hospital. °[address], [address], [place], [place]  [address] ? [address] [address], [place], [place]   [address] °[phone] ? [phone] ? FAX [phone] °Web site:   [email or web address] °

## 2020-03-16 NOTE — Anesthesia Preprocedure Evaluation (Addendum)
Anesthesia Evaluation  Patient identified by MRN, date of birth, ID band Patient awake    Reviewed: Allergy & Precautions, NPO status , Patient's Chart, lab work & pertinent test results  History of Anesthesia Complications Negative for: history of anesthetic complications  Airway Mallampati: I  TM Distance: >3 FB Neck ROM: Full    Dental  (+) Dental Advisory Given, Edentulous Upper, Edentulous Lower   Pulmonary asthma , sleep apnea , Patient abstained from smoking., former smoker,  Covid-19 Nucleic Acid Test Results Lab Results      Component                Value               Date                      [place]              NEGATIVE            03/13/2020              breath sounds clear to auscultation       Cardiovascular hypertension, Pt. on medications and Pt. on home beta blockers (-) angina(-) CHF (-) dysrhythmias  Rhythm:Regular     Neuro/Psych  Headaches, PSYCHIATRIC DISORDERS Anxiety Depression  Neuromuscular disease    GI/Hepatic Neg liver ROS, GERD  Medicated,  Endo/Other  negative endocrine ROS  Renal/GU negative Renal ROSLab Results      Component                Value               Date                      CREATININE               1.00                03/10/2020           Lab Results      Component                Value               Date                      K                        4.8                 03/10/2020                Musculoskeletal  (+) Arthritis ,   Abdominal   Peds  Hematology negative hematology ROS (+)   Anesthesia Other Findings   Reproductive/Obstetrics                            Anesthesia Physical Anesthesia Plan  ASA: II  Anesthesia Plan: General   Post-op Pain Management:    Induction: Intravenous  PONV Risk Score and Plan: 3 and Ondansetron and Dexamethasone  Airway Management Planned: Oral ETT  Additional Equipment: None  Intra-op  Plan:   Post-operative Plan:   Informed Consent: I have reviewed the patients History and Physical, chart, labs and discussed the procedure including the risks, benefits and alternatives for the proposed anesthesia  with the patient or authorized representative who has indicated his/her understanding and acceptance.     Dental advisory given  Plan Discussed with: CRNA and Surgeon  Anesthesia Plan Comments:         Anesthesia Quick Evaluation

## 2020-03-16 NOTE — Progress Notes (Signed)
Wasted 43mL Fentanyl with Rex Kras, RN as witness.

## 2020-03-16 NOTE — Anesthesia Procedure Notes (Signed)
Procedure Name: Intubation Date/Time: 03/16/2020 7:31 AM Performed by: Cleda Daub, CRNA Pre-anesthesia Checklist: Patient identified, Emergency Drugs available, Suction available and Patient being monitored Patient Re-evaluated:Patient Re-evaluated prior to induction Oxygen Delivery Method: Circle system utilized Preoxygenation: Pre-oxygenation with 100% oxygen Induction Type: IV induction Ventilation: Mask ventilation without difficulty Laryngoscope Size: Miller and 2 Grade View: Grade I Tube type: Oral Tube size: 7.0 mm Number of attempts: 1 Airway Equipment and Method: Stylet and Oral airway Placement Confirmation: ETT inserted through vocal cords under direct vision,  positive ETCO2 and breath sounds checked- equal and bilateral Secured at: 20 cm Tube secured with: Tape Dental Injury: Teeth and Oropharynx as per pre-operative assessment

## 2020-03-17 ENCOUNTER — Encounter (HOSPITAL_COMMUNITY): Payer: Self-pay | Admitting: Surgery

## 2020-03-17 LAB — SURGICAL PATHOLOGY

## 2020-03-27 NOTE — Anesthesia Postprocedure Evaluation (Signed)
Anesthesia Post Note  Patient: Kristen [name]  Procedure(s) Performed: LAPAROSCOPIC CHOLECYSTECTOMY WITH INTRAOPERATIVE CHOLANGIOGRAM (N/A Abdomen)     Patient location during evaluation: PACU Anesthesia Type: General Level of consciousness: awake and alert Pain management: pain level controlled Vital Signs Assessment: post-procedure vital signs reviewed and stable Respiratory status: spontaneous breathing, nonlabored ventilation, respiratory function stable and patient connected to nasal cannula oxygen Cardiovascular status: blood pressure returned to baseline and stable Postop Assessment: no apparent nausea or vomiting Anesthetic complications: no   No complications documented.  Last Vitals:  Vitals:   03/16/20 0948 03/16/20 0953  BP: (!) 169/98 (!) 166/89  Pulse: 65 68  Resp: 18 19  Temp:    SpO2: 100% 99%    Last Pain:  Vitals:   03/16/20 0953  TempSrc:   PainSc: 4                  Haruye Lainez

## 2020-10-12 ENCOUNTER — Other Ambulatory Visit: Payer: Self-pay | Admitting: Student in an Organized Health Care Education/Training Program

## 2020-10-12 DIAGNOSIS — I10 Essential (primary) hypertension: Secondary | ICD-10-CM

## 2020-11-01 ENCOUNTER — Other Ambulatory Visit: Payer: Self-pay | Admitting: Neurological Surgery

## 2020-11-01 DIAGNOSIS — G959 Disease of spinal cord, unspecified: Secondary | ICD-10-CM

## 2020-11-01 DIAGNOSIS — M47816 Spondylosis without myelopathy or radiculopathy, lumbar region: Secondary | ICD-10-CM

## 2020-11-21 ENCOUNTER — Encounter (HOSPITAL_COMMUNITY): Payer: Self-pay | Admitting: *Deleted

## 2020-11-21 ENCOUNTER — Emergency Department (HOSPITAL_COMMUNITY)
Admission: EM | Admit: 2020-11-21 | Discharge: 2020-11-21 | Disposition: A | Discharge status: Home / Self Care | Payer: Medicare Other | Attending: Emergency Medicine | Admitting: Emergency Medicine

## 2020-11-21 DIAGNOSIS — Z87891 Personal history of nicotine dependence: Secondary | ICD-10-CM | POA: Diagnosis not present

## 2020-11-21 DIAGNOSIS — Z79899 Other long term (current) drug therapy: Secondary | ICD-10-CM | POA: Insufficient documentation

## 2020-11-21 DIAGNOSIS — I10 Essential (primary) hypertension: Secondary | ICD-10-CM | POA: Diagnosis not present

## 2020-11-21 DIAGNOSIS — Z20822 Contact with and (suspected) exposure to covid-19: Secondary | ICD-10-CM | POA: Insufficient documentation

## 2020-11-21 DIAGNOSIS — Z7982 Long term (current) use of aspirin: Secondary | ICD-10-CM | POA: Diagnosis not present

## 2020-11-21 DIAGNOSIS — J45901 Unspecified asthma with (acute) exacerbation: Secondary | ICD-10-CM | POA: Diagnosis not present

## 2020-11-21 DIAGNOSIS — R0602 Shortness of breath: Secondary | ICD-10-CM | POA: Diagnosis present

## 2020-11-21 DIAGNOSIS — J4521 Mild intermittent asthma with (acute) exacerbation: Secondary | ICD-10-CM

## 2020-11-21 LAB — POC SARS CORONAVIRUS 2 AG -  ED: SARSCOV2ONAVIRUS 2 AG: NEGATIVE

## 2020-11-21 MED ORDER — IPRATROPIUM-ALBUTEROL 0.5-2.5 (3) MG/3ML IN SOLN
3.0000 mL | Freq: Once | RESPIRATORY_TRACT | Status: AC
  Administered 2020-11-21: 11:00:00 3 mL via RESPIRATORY_TRACT
  Filled 2020-11-21: qty 3

## 2020-11-21 MED ORDER — ALBUTEROL SULFATE HFA 108 (90 BASE) MCG/ACT IN AERS: 1.0000 | INHALATION_SPRAY | Freq: Four times a day (QID) | RESPIRATORY_TRACT | 3 refills | Status: AC | PRN

## 2020-11-21 MED ORDER — PREDNISONE 20 MG PO TABS
60.0000 mg | ORAL_TABLET | Freq: Once | ORAL | Status: AC
  Administered 2020-11-21: 10:00:00 60 mg via ORAL
  Filled 2020-11-21: qty 3

## 2020-11-21 MED ORDER — PREDNISONE 50 MG PO TABS: 50.0000 mg | ORAL_TABLET | Freq: Every day | ORAL | 0 refills | Status: DC

## 2020-11-21 MED ORDER — IPRATROPIUM-ALBUTEROL 0.5-2.5 (3) MG/3ML IN SOLN
3.0000 mL | Freq: Once | RESPIRATORY_TRACT | Status: AC
  Administered 2020-11-21: 10:00:00 3 mL via RESPIRATORY_TRACT
  Filled 2020-11-21: qty 3

## 2020-11-21 NOTE — ED Provider Notes (Signed)
[facility] DEPT Provider Note   CSN: [id number] Arrival date & time: 11/21/20  0856     History Chief Complaint  Patient presents with   Shortness of Breath    Kristen [name] is a 60 y.o. female.   Shortness of Breath Severity:  Moderate Onset quality:  Gradual Duration:  3 days Timing:  Constant Chronicity:  Recurrent Context: pollens and weather changes   Context: not URI   Relieved by:  Nothing Worsened by:  Nothing Ineffective treatments: Her asthma inhalers. Associated symptoms: cough and wheezing   Associated symptoms: no abdominal pain, no chest pain and no fever   Patient has tried using her inhalers without much relief.  She continues to feel short of breath.  Her symptoms were worse this morning so she came to the ED.  She is not having any fevers or chills.  No known COVID exposures.  She has been vaccinated    Past Medical History:  Diagnosis Date   Allergy    Anxiety    Arthritis    Asthma    Back pain    Seen by Neurosurery   Depression    Headache    Hyperlipidemia    Hypertension    Leg pain    Bil   Macromastia    Obesity    Peripheral neuropathy    Post-menopausal    Pre-diabetes    Sleep apnea    no cpap    Patient Active Problem List   Diagnosis Date Noted   Right upper quadrant pain 06/02/2019   Abdominal pain 10/07/2018   Major depressive disorder, recurrent episode, moderate ([place]) 06/18/2018   Unexplained night sweats 06/18/2018   Chronic pain of right knee 11/05/2017   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   Encounter for routine gynecological examination with Papanicolaou smear of cervix 07/20/2014   Obstructive sleep apnea 12/26/2011   Prediabetes 07/18/2011   UNSPEC HEREDIT&IDIOPATHIC PERIPHERAL NEUROPATHY 04/16/2010   Allergic rhinitis 09/15/2008   MENOPAUSAL SYNDROME 09/05/2008   Hyperlipemia 06/12/2007   HYPERTENSION, BENIGN SYSTEMIC 07/17/2006    Past Surgical History:  Procedure Laterality  Date   BACK SURGERY     BREAST REDUCTION SURGERY Bilateral 02/13/2018   Procedure: MAMMARY REDUCTION  (BREAST);  Surgeon: Irene Limbo, MD;  Location: [facility];  Service: Plastics;  Laterality: Bilateral;   BREAST SURGERY     carpel tunnel release Bilateral    CHOLECYSTECTOMY N/A 03/16/2020   Procedure: LAPAROSCOPIC CHOLECYSTECTOMY WITH INTRAOPERATIVE CHOLANGIOGRAM;  Surgeon: Donnie Mesa, MD;  Location: [facility];  Service: General;  Laterality: N/A;   COLONOSCOPY     LAMINECTOMY  01/20/2008   L4-L5 by Dr Latanya Maudlin (Ortho), [facility]   POLYPECTOMY     REDUCTION MAMMAPLASTY Bilateral 01/2018   TUBAL LIGATION       OB History   No obstetric history on file.     Family History  Problem Relation Age of Onset   Lupus Mother    Heart disease Mother    Anemia Father    Hypertension Father    Arthritis Brother    Asthma Son    Lupus Daughter    Colon cancer Neg Hx    Colon polyps Neg Hx    Esophageal cancer Neg Hx    Rectal cancer Neg Hx    Stomach cancer Neg Hx    Breast cancer Neg Hx     Social History   Tobacco Use   Smoking status: Former    Packs/day: 0.25  Years: 0.50    Pack years: 0.13    Types: Cigarettes    Quit date: 12/19/1990    Years since quitting: 29.9   Smokeless tobacco: Never   Tobacco comments:    Quit 25 years ago.  Vaping Use   Vaping Use: Never used  Substance Use Topics   Alcohol use: No    Alcohol/week: 0.0 standard drinks    Comment: 05/29/16 none   Drug use: Yes    Frequency: 21.0 times per week    Types: Marijuana    Home Medications Prior to Admission medications   Medication Sig Start Date End Date Taking? Authorizing Provider  predniSONE (DELTASONE) 50 MG tablet Take 1 tablet (50 mg total) by mouth daily. 11/21/20  Yes Dorie Rank, MD  albuterol (VENTOLIN HFA) 108 (90 Base) MCG/ACT inhaler Inhale 1-2 puffs into the lungs every 6 (six) hours as needed for wheezing or shortness of breath. 11/21/20    Dorie Rank, MD  aspirin EC 81 MG tablet Take 81 mg by mouth daily. Swallow whole.    [provider]  carvedilol (COREG) 25 MG tablet Take 1 tablet (25 mg total) by mouth 2 (two) times daily with a meal. 08/21/19   Anderson, Chelsey L, DO  citalopram (CELEXA) 20 MG tablet Take 20 mg by mouth daily. 11/22/19   [provider]  cycloSPORINE (RESTASIS) 0.05 % ophthalmic emulsion Place 1 drop into both eyes daily as needed (dry eyes).    [provider]  famotidine (PEPCID) 40 MG tablet Take 40 mg by mouth daily. 02/23/20   [provider]  gabapentin (NEURONTIN) 300 MG capsule Take 2-3 capsules (600-900 mg total) by mouth 2 (two) times daily. Take 600mg  every afternoon and 900mg  qhs Patient taking differently: Take 600-900 mg by mouth See admin instructions. Take 600 mg by mouth in the afternoon and 900 mg at bedtime 08/21/19   Doristine Mango L, DO  hydrOXYzine (ATARAX/VISTARIL) 25 MG tablet Take 25 mg by mouth 2 (two) times daily as needed for anxiety. 12/23/19   [provider]  losartan (COZAAR) 50 MG tablet TAKE 1 TABLET TWICE DAILY Patient taking differently: Take 50 mg by mouth daily.  02/23/20   Anderson, Chelsey L, DO  oxyCODONE (OXY IR/ROXICODONE) 5 MG immediate release tablet Take 1 tablet (5 mg total) by mouth every 6 (six) hours as needed for moderate pain, severe pain or breakthrough pain. 03/16/20   Donnie Mesa, MD  traZODone (DESYREL) 50 MG tablet Take 1 tablet (50 mg total) by mouth at bedtime. Patient taking differently: Take 50 mg by mouth at bedtime as needed for sleep.  11/06/18   Doristine Mango L, DO    Allergies    Amoxicillin and Penicillins  Review of Systems   Review of Systems  Constitutional:  Negative for fever.  Respiratory:  Positive for cough, shortness of breath and wheezing.   Cardiovascular:  Negative for chest pain.  Gastrointestinal:  Negative for abdominal pain.  All other systems reviewed and are  negative.  Physical Exam Updated Vital Signs BP (!) 169/113 (BP Location: Left Arm)   Pulse 73   Temp 98.2 F (36.8 C) (Oral)   Resp 16   SpO2 98%   Physical Exam Vitals and nursing note reviewed.  Constitutional:      General: She is not in acute distress.    Appearance: She is well-developed.  HENT:     Head: Normocephalic and atraumatic.     Right Ear: External ear normal.  Left Ear: External ear normal.  Eyes:     General: No scleral icterus.       Right eye: No discharge.        Left eye: No discharge.     Conjunctiva/sclera: Conjunctivae normal.  Neck:     Trachea: No tracheal deviation.  Cardiovascular:     Rate and Rhythm: Normal rate and regular rhythm.  Pulmonary:     Effort: Pulmonary effort is normal. No respiratory distress.     Breath sounds: No stridor. Wheezing present. No rales.     Comments: Able to speak in full sentences Abdominal:     General: Bowel sounds are normal. There is no distension.     Palpations: Abdomen is soft.     Tenderness: There is no abdominal tenderness. There is no guarding or rebound.  Musculoskeletal:        General: No tenderness or deformity.     Cervical back: Neck supple.  Skin:    General: Skin is warm and dry.     Findings: No rash.  Neurological:     General: No focal deficit present.     Mental Status: She is alert.     Cranial Nerves: No cranial nerve deficit (no facial droop, extraocular movements intact, no slurred speech).     Sensory: No sensory deficit.     Motor: No abnormal muscle tone or seizure activity.     Coordination: Coordination normal.  Psychiatric:        Mood and Affect: Mood normal.    ED Results / Procedures / Treatments   Labs (all labs ordered are listed, but only abnormal results are displayed) Labs Reviewed  POC SARS CORONAVIRUS 2 AG -  ED    EKG None  Radiology No results found.  Procedures Procedures   Medications Ordered in ED Medications  ipratropium-albuterol  (DUONEB) 0.5-2.5 (3) MG/3ML nebulizer solution 3 mL (3 mLs Nebulization Given 11/21/20 1024)  predniSONE (DELTASONE) tablet 60 mg (60 mg Oral Given 11/21/20 0952)  ipratropium-albuterol (DUONEB) 0.5-2.5 (3) MG/3ML nebulizer solution 3 mL (3 mLs Nebulization Given 11/21/20 1052)    ED Course  I have reviewed the triage vital signs and the nursing notes.  Pertinent labs & imaging results that were available during my care of the patient were reviewed by me and considered in my medical decision making (see chart for details).  Clinical Course as of 11/21/20 1135  Tue Nov 21, 2020  1108 Covid test is negative [JK]  1132 Patient feeling much better after her breathing treatment [JK]    Clinical Course User Index [JK] Dorie Rank, MD   MDM Rules/Calculators/A&P                          Patient presented to the ED for evaluation of shortness of breath.  Patient has history of asthma.  On exam she was wheezing.  Patient did have a rapid COVID test that was negative.  Lung sounds are much improved after breathing treatments and steroids.  I doubt pneumonia or CHF.  Will discharge home with a prescription for prednisone and a refill of her albuterol inhalers as requested.  Patient also has history of hypertension.  Noted to be hypertensive here in the ED.  Encourage her to continue her medications and follow-up with PCP Final Clinical Impression(s) / ED Diagnoses Final diagnoses:  Exacerbation of intermittent asthma, unspecified asthma severity  Hypertension, unspecified type    Rx / DC  Orders ED Discharge Orders          Ordered    predniSONE (DELTASONE) 50 MG tablet  Daily        11/21/20 1133    albuterol (VENTOLIN HFA) 108 (90 Base) MCG/ACT inhaler  Every 6 hours PRN        11/21/20 1133             Dorie Rank, MD 11/21/20 1137

## 2020-11-21 NOTE — ED Triage Notes (Signed)
Pt complains of shortness of breath x 3 days. She feels it is related to her asthma, reports being outside with dusty and windy conditions. She has tried albuterol inhaler w/o relief.

## 2020-11-21 NOTE — Discharge Instructions (Addendum)
Continue to use your inhaler.  Take the steroids until complete.  Follow-up with your doctor next week to be rechecked.  Your blood pressure was also elevated while you are here.  Make sure to take your blood pressure medications and follow-up with your primary care doctor to have that rechecked

## 2020-11-21 NOTE — ED Notes (Signed)
ED Provider at bedside. 

## 2020-12-06 ENCOUNTER — Other Ambulatory Visit: Payer: Self-pay

## 2020-12-06 ENCOUNTER — Inpatient Hospital Stay (HOSPITAL_COMMUNITY)
Admission: EM | Admit: 2020-12-06 | Discharge: 2020-12-13 | DRG: 471 | Disposition: A | Discharge status: Home / Self Care | Payer: Medicare Other | Attending: Neurological Surgery | Admitting: Neurological Surgery

## 2020-12-06 DIAGNOSIS — Z8261 Family history of arthritis: Secondary | ICD-10-CM

## 2020-12-06 DIAGNOSIS — Z20822 Contact with and (suspected) exposure to covid-19: Secondary | ICD-10-CM | POA: Diagnosis present

## 2020-12-06 DIAGNOSIS — Z87891 Personal history of nicotine dependence: Secondary | ICD-10-CM

## 2020-12-06 DIAGNOSIS — I10 Essential (primary) hypertension: Secondary | ICD-10-CM | POA: Diagnosis present

## 2020-12-06 DIAGNOSIS — F32A Depression, unspecified: Secondary | ICD-10-CM | POA: Diagnosis present

## 2020-12-06 DIAGNOSIS — Z9049 Acquired absence of other specified parts of digestive tract: Secondary | ICD-10-CM

## 2020-12-06 DIAGNOSIS — W1839XA Other fall on same level, initial encounter: Secondary | ICD-10-CM | POA: Diagnosis present

## 2020-12-06 DIAGNOSIS — Z79891 Long term (current) use of opiate analgesic: Secondary | ICD-10-CM

## 2020-12-06 DIAGNOSIS — J309 Allergic rhinitis, unspecified: Secondary | ICD-10-CM | POA: Diagnosis present

## 2020-12-06 DIAGNOSIS — F419 Anxiety disorder, unspecified: Secondary | ICD-10-CM | POA: Diagnosis present

## 2020-12-06 DIAGNOSIS — E785 Hyperlipidemia, unspecified: Secondary | ICD-10-CM | POA: Diagnosis present

## 2020-12-06 DIAGNOSIS — E1142 Type 2 diabetes mellitus with diabetic polyneuropathy: Secondary | ICD-10-CM | POA: Diagnosis present

## 2020-12-06 DIAGNOSIS — Z79899 Other long term (current) drug therapy: Secondary | ICD-10-CM

## 2020-12-06 DIAGNOSIS — Z881 Allergy status to other antibiotic agents status: Secondary | ICD-10-CM

## 2020-12-06 DIAGNOSIS — R200 Anesthesia of skin: Secondary | ICD-10-CM

## 2020-12-06 DIAGNOSIS — M4802 Spinal stenosis, cervical region: Principal | ICD-10-CM | POA: Diagnosis present

## 2020-12-06 DIAGNOSIS — Z7982 Long term (current) use of aspirin: Secondary | ICD-10-CM

## 2020-12-06 DIAGNOSIS — E669 Obesity, unspecified: Secondary | ICD-10-CM | POA: Diagnosis present

## 2020-12-06 DIAGNOSIS — G8929 Other chronic pain: Secondary | ICD-10-CM | POA: Diagnosis present

## 2020-12-06 DIAGNOSIS — R55 Syncope and collapse: Secondary | ICD-10-CM | POA: Diagnosis present

## 2020-12-06 DIAGNOSIS — R531 Weakness: Secondary | ICD-10-CM

## 2020-12-06 DIAGNOSIS — Z8249 Family history of ischemic heart disease and other diseases of the circulatory system: Secondary | ICD-10-CM

## 2020-12-06 DIAGNOSIS — Z419 Encounter for procedure for purposes other than remedying health state, unspecified: Secondary | ICD-10-CM

## 2020-12-06 DIAGNOSIS — G4733 Obstructive sleep apnea (adult) (pediatric): Secondary | ICD-10-CM | POA: Diagnosis present

## 2020-12-06 DIAGNOSIS — Z6829 Body mass index (BMI) 29.0-29.9, adult: Secondary | ICD-10-CM

## 2020-12-06 DIAGNOSIS — S14124A Central cord syndrome at C4 level of cervical spinal cord, initial encounter: Secondary | ICD-10-CM | POA: Diagnosis present

## 2020-12-06 DIAGNOSIS — Z880 Allergy status to penicillin: Secondary | ICD-10-CM

## 2020-12-06 DIAGNOSIS — M4712 Other spondylosis with myelopathy, cervical region: Secondary | ICD-10-CM | POA: Diagnosis present

## 2020-12-06 DIAGNOSIS — M2578 Osteophyte, vertebrae: Secondary | ICD-10-CM | POA: Diagnosis present

## 2020-12-06 DIAGNOSIS — Z7952 Long term (current) use of systemic steroids: Secondary | ICD-10-CM

## 2020-12-06 DIAGNOSIS — I160 Hypertensive urgency: Secondary | ICD-10-CM

## 2020-12-06 NOTE — ED Triage Notes (Signed)
Pt arrived today s/p fall.  Pt states she was standing and suddenly collapsed.  Pt states she fell because she passed out, pt thinks she hit her nose.  Dried blood seen on lips and left hand.  Pt a&ox4, elevated BP in triage.  Pt unsure if she hit her head or not upon falling.  Pt c/o bilateral arm and hand tingling with pain.

## 2020-12-07 ENCOUNTER — Observation Stay (HOSPITAL_COMMUNITY): Payer: Medicare Other

## 2020-12-07 ENCOUNTER — Emergency Department (HOSPITAL_COMMUNITY): Payer: Medicare Other

## 2020-12-07 ENCOUNTER — Encounter (HOSPITAL_COMMUNITY): Payer: Self-pay | Admitting: Internal Medicine

## 2020-12-07 DIAGNOSIS — R55 Syncope and collapse: Secondary | ICD-10-CM | POA: Diagnosis present

## 2020-12-07 LAB — COMPREHENSIVE METABOLIC PANEL
ALT: 31 U/L (ref 0–44)
AST: 25 U/L (ref 15–41)
Albumin: 3.8 g/dL (ref 3.5–5.0)
Alkaline Phosphatase: 126 U/L (ref 38–126)
Anion gap: 10 (ref 5–15)
BUN: 15 mg/dL (ref 6–20)
CO2: 27 mmol/L (ref 22–32)
Calcium: 9.5 mg/dL (ref 8.9–10.3)
Chloride: 105 mmol/L (ref 98–111)
Creatinine, Ser: 1.03 mg/dL — ABNORMAL HIGH (ref 0.44–1.00)
GFR, Estimated: >60 mL/min (ref >60)
Glucose, Bld: 130 mg/dL — ABNORMAL HIGH (ref 70–99)
Potassium: 4.6 mmol/L (ref 3.5–5.1)
Sodium: 142 mmol/L (ref 135–145)
Total Bilirubin: 0.7 mg/dL (ref 0.3–1.2)
Total Protein: 7.5 g/dL (ref 6.5–8.1)

## 2020-12-07 LAB — CBC WITH DIFFERENTIAL/PLATELET
Abs Immature Granulocytes: 0.05 10*3/uL (ref 0.00–0.07)
Basophils Absolute: 0.1 10*3/uL (ref 0.0–0.1)
Basophils Relative: 1 %
Eosinophils Absolute: 0.1 10*3/uL (ref 0.0–0.5)
Eosinophils Relative: 1 %
HCT: 42.1 % (ref 36.0–46.0)
Hemoglobin: 13.5 g/dL (ref 12.0–15.0)
Immature Granulocytes: 1 %
Lymphocytes Relative: 16 %
Lymphs Abs: 1.7 10*3/uL (ref 0.7–4.0)
MCH: 27.8 pg (ref 26.0–34.0)
MCHC: 32.1 g/dL (ref 30.0–36.0)
MCV: 86.6 fL (ref 80.0–100.0)
Monocytes Absolute: 0.7 10*3/uL (ref 0.1–1.0)
Monocytes Relative: 7 %
Neutro Abs: 8.1 10*3/uL — ABNORMAL HIGH (ref 1.7–7.7)
Neutrophils Relative %: 74 %
Platelets: 257 10*3/uL (ref 150–400)
RBC: 4.86 MIL/uL (ref 3.87–5.11)
RDW: 15.9 % — ABNORMAL HIGH (ref 11.5–15.5)
WBC: 10.8 10*3/uL — ABNORMAL HIGH (ref 4.0–10.5)
nRBC: 0 % (ref 0.0–0.2)

## 2020-12-07 LAB — TROPONIN I (HIGH SENSITIVITY)
Troponin I (High Sensitivity): 4 ng/L (ref <18)
Troponin I (High Sensitivity): 4 ng/L (ref <18)

## 2020-12-07 LAB — HEMOGLOBIN A1C
Hgb A1c MFr Bld: 6.4 % — ABNORMAL HIGH (ref 4.8–5.6)
Mean Plasma Glucose: 136.98 mg/dL

## 2020-12-07 LAB — CBG MONITORING, ED: Glucose-Capillary: 93 mg/dL (ref 70–99)

## 2020-12-07 MED ORDER — ONDANSETRON HCL 4 MG/2ML IJ SOLN: 4.0000 mg | Freq: Four times a day (QID) | INTRAMUSCULAR | Status: DC | PRN

## 2020-12-07 MED ORDER — CLONIDINE HCL 0.1 MG PO TABS
0.1000 mg | ORAL_TABLET | Freq: Once | ORAL | Status: AC
  Administered 2020-12-07: 0.1 mg via ORAL
  Filled 2020-12-07: qty 1

## 2020-12-07 MED ORDER — HYDROCODONE-ACETAMINOPHEN 5-325 MG PO TABS
1.0000 | ORAL_TABLET | ORAL | Status: DC | PRN
  Administered 2020-12-08 (×2): 2 via ORAL
  Administered 2020-12-09 (×2): 1 via ORAL
  Administered 2020-12-09 – 2020-12-10 (×2): 2 via ORAL
  Administered 2020-12-10: 1 via ORAL
  Administered 2020-12-11 (×2): 2 via ORAL
  Administered 2020-12-11: 1 via ORAL
  Filled 2020-12-07 (×3): qty 2
  Filled 2020-12-07: qty 1
  Filled 2020-12-07: qty 2
  Filled 2020-12-07 (×2): qty 1
  Filled 2020-12-07: qty 2
  Filled 2020-12-07: qty 1
  Filled 2020-12-07 (×2): qty 2

## 2020-12-07 MED ORDER — VITAMIN D (ERGOCALCIFEROL) 1.25 MG (50000 UNIT) PO CAPS
50000.0000 [IU] | ORAL_CAPSULE | ORAL | Status: DC
  Administered 2020-12-08: 50000 [IU] via ORAL
  Filled 2020-12-07: qty 1

## 2020-12-07 MED ORDER — LOSARTAN POTASSIUM 50 MG PO TABS
50.0000 mg | ORAL_TABLET | Freq: Every day | ORAL | Status: DC
  Administered 2020-12-07 – 2020-12-13 (×6): 50 mg via ORAL
  Filled 2020-12-07 (×7): qty 1

## 2020-12-07 MED ORDER — ACETAMINOPHEN 650 MG RE SUPP: 650.0000 mg | Freq: Four times a day (QID) | RECTAL | Status: DC | PRN

## 2020-12-07 MED ORDER — MORPHINE SULFATE (PF) 4 MG/ML IV SOLN
4.0000 mg | Freq: Once | INTRAVENOUS | Status: AC
  Administered 2020-12-07: 4 mg via INTRAVENOUS
  Filled 2020-12-07: qty 1

## 2020-12-07 MED ORDER — CYCLOSPORINE 0.05 % OP EMUL: 1.0000 [drp] | Freq: Every day | OPHTHALMIC | Status: DC | PRN

## 2020-12-07 MED ORDER — MORPHINE SULFATE (PF) 4 MG/ML IV SOLN
4.0000 mg | INTRAVENOUS | Status: DC | PRN
  Administered 2020-12-07 – 2020-12-12 (×6): 4 mg via INTRAVENOUS
  Filled 2020-12-07 (×6): qty 1

## 2020-12-07 MED ORDER — SODIUM CHLORIDE 0.9% FLUSH
3.0000 mL | Freq: Two times a day (BID) | INTRAVENOUS | Status: DC
  Administered 2020-12-07 – 2020-12-13 (×12): 3 mL via INTRAVENOUS

## 2020-12-07 MED ORDER — ACETAMINOPHEN 325 MG PO TABS
650.0000 mg | ORAL_TABLET | Freq: Four times a day (QID) | ORAL | Status: DC | PRN
  Administered 2020-12-13: 650 mg via ORAL
  Filled 2020-12-07: qty 2

## 2020-12-07 MED ORDER — ONDANSETRON HCL 4 MG PO TABS
4.0000 mg | ORAL_TABLET | Freq: Four times a day (QID) | ORAL | Status: DC | PRN
  Filled 2020-12-07: qty 1

## 2020-12-07 MED ORDER — ALUM & MAG HYDROXIDE-SIMETH 200-200-20 MG/5ML PO SUSP: 30.0000 mL | Freq: Four times a day (QID) | ORAL | Status: DC | PRN

## 2020-12-07 MED ORDER — INSULIN ASPART 100 UNIT/ML IJ SOLN
0.0000 [IU] | Freq: Three times a day (TID) | INTRAMUSCULAR | Status: DC
  Administered 2020-12-08: 3 [IU] via SUBCUTANEOUS
  Administered 2020-12-13: 2 [IU] via SUBCUTANEOUS

## 2020-12-07 MED ORDER — HYDRALAZINE HCL 20 MG/ML IJ SOLN
10.0000 mg | Freq: Three times a day (TID) | INTRAMUSCULAR | Status: DC | PRN
  Filled 2020-12-07: qty 1

## 2020-12-07 MED ORDER — SODIUM CHLORIDE 0.9 % IV BOLUS
1000.0000 mL | Freq: Once | INTRAVENOUS | Status: AC
  Administered 2020-12-07: 1000 mL via INTRAVENOUS

## 2020-12-07 MED ORDER — GABAPENTIN 300 MG PO CAPS
300.0000 mg | ORAL_CAPSULE | Freq: Two times a day (BID) | ORAL | Status: DC
  Administered 2020-12-07: 300 mg via ORAL
  Filled 2020-12-07: qty 1

## 2020-12-07 MED ORDER — MOMETASONE FURO-FORMOTEROL FUM 100-5 MCG/ACT IN AERO
2.0000 | INHALATION_SPRAY | Freq: Two times a day (BID) | RESPIRATORY_TRACT | Status: DC
  Administered 2020-12-07 – 2020-12-12 (×10): 2 via RESPIRATORY_TRACT
  Filled 2020-12-07: qty 8.8

## 2020-12-07 MED ORDER — HYDROCODONE-ACETAMINOPHEN 5-325 MG PO TABS
2.0000 | ORAL_TABLET | Freq: Once | ORAL | Status: AC
  Administered 2020-12-07: 2 via ORAL
  Filled 2020-12-07: qty 2

## 2020-12-07 MED ORDER — ALBUTEROL SULFATE (2.5 MG/3ML) 0.083% IN NEBU: 3.0000 mL | INHALATION_SOLUTION | Freq: Four times a day (QID) | RESPIRATORY_TRACT | Status: DC | PRN

## 2020-12-07 MED ORDER — ATORVASTATIN CALCIUM 10 MG PO TABS
10.0000 mg | ORAL_TABLET | Freq: Every day | ORAL | Status: DC
  Administered 2020-12-07 – 2020-12-13 (×6): 10 mg via ORAL
  Filled 2020-12-07 (×7): qty 1

## 2020-12-07 MED ORDER — GABAPENTIN 300 MG PO CAPS
300.0000 mg | ORAL_CAPSULE | Freq: Three times a day (TID) | ORAL | Status: DC
  Administered 2020-12-07 – 2020-12-11 (×13): 300 mg via ORAL
  Filled 2020-12-07 (×14): qty 1

## 2020-12-07 MED ORDER — CARVEDILOL 25 MG PO TABS
25.0000 mg | ORAL_TABLET | Freq: Two times a day (BID) | ORAL | Status: DC
  Administered 2020-12-08 – 2020-12-13 (×10): 25 mg via ORAL
  Filled 2020-12-07 (×5): qty 1
  Filled 2020-12-07: qty 2
  Filled 2020-12-07 (×6): qty 1

## 2020-12-07 NOTE — ED Notes (Signed)
Patient transported to CT 

## 2020-12-07 NOTE — Consult Note (Signed)
   Providing Compassionate, Quality Care - Together  Neurosurgery Consult  Referring physician: Dr. Marylyn Ishihara Reason for referral: Cervical stenosis  Chief Complaint: Syncope, burning numbness in hands  History of Present Illness: This is a 60 y/o F with a history of numbness and tingling in her hands, difficulty with her balance that had a syncopal episode and hit her head. She now complains of numbness worsening to prior in her hands up to her shoulders, sensitivity to light touch in her hands and arms and slight weakness. She also complains of neck spasms.  ED workup revealed stenosis and cord signal changes at C4/5 and C5/6 due to degenerative changes   Medications: I have reviewed the patient's current medications. Allergies: No Known Allergies  History reviewed. No pertinent family history. Social History:  has no history on file for tobacco use, alcohol use, and drug use.  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  -she will discuss with family and let me know tomorrow, will call and discuss with her  -cont syncope workup -if she decides on surgery, I will have her transferred to [facility] for planned intervention on Tuesday -cont gabapentin 300 tid -pain control   Thank you for allowing me to participate in this patient's care.  Please do not hesitate to call with questions or  concerns.   Elwin Sleight, [facility] Cell: [phone]

## 2020-12-07 NOTE — ED Notes (Signed)
Pt given lunch tray.

## 2020-12-07 NOTE — ED Notes (Signed)
Updated family with regards to plan of care

## 2020-12-07 NOTE — ED Notes (Signed)
Dr. Stark Jock notified about patients BP, systolic over 507.

## 2020-12-07 NOTE — ED Notes (Signed)
Pt to MRI at this time.

## 2020-12-07 NOTE — ED Notes (Signed)
Patient back from CT.

## 2020-12-07 NOTE — ED Provider Notes (Signed)
[facility] DEPT Provider Note   CSN: [id number] Arrival date & time: 12/06/20  2109     History Chief Complaint  Patient presents with   Loss of Consciousness    Kristen [name] is a 60 y.o. female.  Patient is a 60 year old female with past medical history of asthma, hypertension, hyperlipidemia.  She presents today for evaluation of a syncopal episode.  Patient tells me she was cooking dinner when she began to feel flushed.  She then walked from the kitchen and passed out on the floor.  She reports a brief loss of consciousness during which time she struck her mouth on the floor and has a small lip laceration.  She is also describes a "pins-and-needles" sensation to both upper arms.  She denies any chest pain or palpitations.  She denies any recent illness.  The history is provided by the patient.  Loss of Consciousness Episode history:  Single Most recent episode:  Today Timing:  Constant Chronicity:  New Worsened by:  Nothing Ineffective treatments:  None tried     Past Medical History:  Diagnosis Date   Allergy    Anxiety    Arthritis    Asthma    Back pain    Seen by Neurosurery   Depression    Headache    Hyperlipidemia    Hypertension    Leg pain    Bil   Macromastia    Obesity    Peripheral neuropathy    Post-menopausal    Pre-diabetes    Sleep apnea    no cpap    Patient Active Problem List   Diagnosis Date Noted   Right upper quadrant pain 06/02/2019   Abdominal pain 10/07/2018   Major depressive disorder, recurrent episode, moderate ([place]) 06/18/2018   Unexplained night sweats 06/18/2018   Chronic pain of right knee 11/05/2017   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   Encounter for routine gynecological examination with Papanicolaou smear of cervix 07/20/2014   Obstructive sleep apnea 12/26/2011   Prediabetes 07/18/2011   UNSPEC HEREDIT&IDIOPATHIC PERIPHERAL NEUROPATHY 04/16/2010   Allergic rhinitis 09/15/2008    MENOPAUSAL SYNDROME 09/05/2008   Hyperlipemia 06/12/2007   HYPERTENSION, BENIGN SYSTEMIC 07/17/2006    Past Surgical History:  Procedure Laterality Date   BACK SURGERY     BREAST REDUCTION SURGERY Bilateral 02/13/2018   Procedure: MAMMARY REDUCTION  (BREAST);  Surgeon: Irene Limbo, MD;  Location: [facility];  Service: Plastics;  Laterality: Bilateral;   BREAST SURGERY     carpel tunnel release Bilateral    CHOLECYSTECTOMY N/A 03/16/2020   Procedure: LAPAROSCOPIC CHOLECYSTECTOMY WITH INTRAOPERATIVE CHOLANGIOGRAM;  Surgeon: Donnie Mesa, MD;  Location: [facility];  Service: General;  Laterality: N/A;   COLONOSCOPY     LAMINECTOMY  01/20/2008   L4-L5 by Dr Latanya Maudlin (Ortho), [facility]   POLYPECTOMY     REDUCTION MAMMAPLASTY Bilateral 01/2018   TUBAL LIGATION       OB History   No obstetric history on file.     Family History  Problem Relation Age of Onset   Lupus Mother    Heart disease Mother    Anemia Father    Hypertension Father    Arthritis Brother    Asthma Son    Lupus Daughter    Colon cancer Neg Hx    Colon polyps Neg Hx    Esophageal cancer Neg Hx    Rectal cancer Neg Hx    Stomach cancer Neg Hx    Breast cancer  Neg Hx     Social History   Tobacco Use   Smoking status: Former    Packs/day: 0.25    Years: 0.50    Pack years: 0.13    Types: Cigarettes    Quit date: 12/19/1990    Years since quitting: 29.9   Smokeless tobacco: Never   Tobacco comments:    Quit 25 years ago.  Vaping Use   Vaping Use: Never used  Substance Use Topics   Alcohol use: No    Alcohol/week: 0.0 standard drinks    Comment: 05/29/16 none   Drug use: Yes    Frequency: 21.0 times per week    Types: Marijuana    Home Medications Prior to Admission medications   Medication Sig Start Date End Date Taking? Authorizing Provider  albuterol (VENTOLIN HFA) 108 (90 Base) MCG/ACT inhaler Inhale 1-2 puffs into the lungs every 6 (six) hours as needed  for wheezing or shortness of breath. 11/21/20   Dorie Rank, MD  aspirin EC 81 MG tablet Take 81 mg by mouth daily. Swallow whole.    [provider]  carvedilol (COREG) 25 MG tablet Take 1 tablet (25 mg total) by mouth 2 (two) times daily with a meal. 08/21/19   Anderson, Chelsey L, DO  citalopram (CELEXA) 20 MG tablet Take 20 mg by mouth daily. 11/22/19   [provider]  cycloSPORINE (RESTASIS) 0.05 % ophthalmic emulsion Place 1 drop into both eyes daily as needed (dry eyes).    [provider]  famotidine (PEPCID) 40 MG tablet Take 40 mg by mouth daily. 02/23/20   [provider]  gabapentin (NEURONTIN) 300 MG capsule Take 2-3 capsules (600-900 mg total) by mouth 2 (two) times daily. Take 600mg  every afternoon and 900mg  qhs Patient taking differently: Take 600-900 mg by mouth See admin instructions. Take 600 mg by mouth in the afternoon and 900 mg at bedtime 08/21/19   Doristine Mango L, DO  hydrOXYzine (ATARAX/VISTARIL) 25 MG tablet Take 25 mg by mouth 2 (two) times daily as needed for anxiety. 12/23/19   [provider]  losartan (COZAAR) 50 MG tablet TAKE 1 TABLET TWICE DAILY Patient taking differently: Take 50 mg by mouth daily.  02/23/20   Anderson, Chelsey L, DO  oxyCODONE (OXY IR/ROXICODONE) 5 MG immediate release tablet Take 1 tablet (5 mg total) by mouth every 6 (six) hours as needed for moderate pain, severe pain or breakthrough pain. 03/16/20   Donnie Mesa, MD  predniSONE (DELTASONE) 50 MG tablet Take 1 tablet (50 mg total) by mouth daily. 11/21/20   Dorie Rank, MD  traZODone (DESYREL) 50 MG tablet Take 1 tablet (50 mg total) by mouth at bedtime. Patient taking differently: Take 50 mg by mouth at bedtime as needed for sleep.  11/06/18   Doristine Mango L, DO    Allergies    Amoxicillin and Penicillins  Review of Systems   Review of Systems  Cardiovascular:  Positive for syncope.  All other systems reviewed and are negative.  Physical  Exam Updated Vital Signs BP (!) 217/102   Pulse 63   Temp 98.2 F (36.8 C) (Oral)   Resp 17   Ht 5\' 2"  (1.575 m)   Wt 72.6 kg   SpO2 97%   BMI 29.26 kg/m   Physical Exam Vitals and nursing note reviewed.  Constitutional:      General: She is not in acute distress.    Appearance: She is well-developed. She is not diaphoretic.  HENT:  Head: Normocephalic and atraumatic.  Cardiovascular:     Rate and Rhythm: Normal rate and regular rhythm.     Heart sounds: No murmur heard.   No friction rub. No gallop.  Pulmonary:     Effort: Pulmonary effort is normal. No respiratory distress.     Breath sounds: Normal breath sounds. No wheezing.  Abdominal:     General: Bowel sounds are normal. There is no distension.     Palpations: Abdomen is soft.     Tenderness: There is no abdominal tenderness.  Musculoskeletal:        General: Normal range of motion.     Cervical back: Normal range of motion and neck supple.     Comments: Bilateral upper extremities are normal in appearance.  Patient has exquisite tenderness with palpation of the soft tissues between the shoulder and fingertips.  Ulnar and radial pulses are easily palpable.  She is able to move all fingers.  Skin:    General: Skin is warm and dry.  Neurological:     General: No focal deficit present.     Mental Status: She is alert and oriented to person, place, and time.    ED Results / Procedures / Treatments   Labs (all labs ordered are listed, but only abnormal results are displayed) Labs Reviewed  COMPREHENSIVE METABOLIC PANEL  CBC WITH DIFFERENTIAL/PLATELET    EKG None  Radiology No results found.  Procedures Procedures   Medications Ordered in ED Medications  sodium chloride 0.9 % bolus 1,000 mL (has no administration in time range)  HYDROcodone-acetaminophen (NORCO/VICODIN) 5-325 MG per tablet 2 tablet (has no administration in time range)    ED Course  I have reviewed the triage vital signs and the  nursing notes.  Pertinent labs & imaging results that were available during my care of the patient were reviewed by me and considered in my medical decision making (see chart for details).    MDM Rules/Calculators/A&P  Patient is a 60 year old female presenting after a syncopal episode as described in the HPI.  I am uncertain as to what caused her syncope, but she reports severe, residual weakness and a "pins-and-needles" sensation to both arms.  She reports being too weak to sit up or walk.  Patient's work-up is essentially unremarkable and I see nothing that would explain the weakness she is describing.  As she is unable to sit up in bed, I feel as though she will require admission for observation.  I have spoken with Dr. Alcario Drought who agrees to admit.  Patient did have elevated blood pressure and was given a dose of clonidine with good results.  Final Clinical Impression(s) / ED Diagnoses Final diagnoses:  None    Rx / DC Orders ED Discharge Orders     None        Veryl Speak, MD 12/07/20 [id number]

## 2020-12-07 NOTE — H&P (Addendum)
History and Physical    [name] YNW:[id number] DOB: 09/04/1960 DOA: 12/06/2020  PCP: Dulce Sellar, MD  Patient coming from: Home  Chief Complaint: "I passed out"  HPI: Kristen [name] is a 60 y.o. female with medical history significant of DM, polyneuropathy, HTN, HLD, chronic neck/back pain. Presenting after syncope w/ fall. History is from patient and significant other. Patient was cooking dinner last night. She felt "flushed", so she moved out of the kitchened. Her vision got blurry as she walked. She then passed out. She doesn't remember the fall. She did hit her head w/ resulting lip wound and nosebleed. Her family came to her side shortly and she recovered w/ eyes open w/i a few seconds. She became coherent and aware w/i a minute or two. Family denies any incontinence. They were concerned and brought her to the ED. She notes that she has a "pins and needles" sensation in her b/l hands. It is worse in intensity after the fall.     ED Course: CTH was negative. She was given clonidine for elevated blood pressures. TRH was called for admission.   Review of Systems:  Denies CP, palpitations, N/V/D, fever, sick contacts, auditory changes, bowel/bladder incontinence. Review of systems is otherwise negative for all not mentioned in HPI.   PMHx Past Medical History:  Diagnosis Date   Allergy    Anxiety    Arthritis    Asthma    Back pain    Seen by Neurosurery   Depression    Headache    Hyperlipidemia    Hypertension    Leg pain    Bil   Macromastia    Obesity    Peripheral neuropathy    Post-menopausal    Pre-diabetes    Sleep apnea    no cpap    PSHx Past Surgical History:  Procedure Laterality Date   BACK SURGERY     BREAST REDUCTION SURGERY Bilateral 02/13/2018   Procedure: MAMMARY REDUCTION  (BREAST);  Surgeon: Irene Limbo, MD;  Location: [facility];  Service: Plastics;  Laterality: Bilateral;   BREAST SURGERY     carpel tunnel  release Bilateral    CHOLECYSTECTOMY N/A 03/16/2020   Procedure: LAPAROSCOPIC CHOLECYSTECTOMY WITH INTRAOPERATIVE CHOLANGIOGRAM;  Surgeon: Donnie Mesa, MD;  Location: [facility];  Service: General;  Laterality: N/A;   COLONOSCOPY     LAMINECTOMY  01/20/2008   L4-L5 by Dr Latanya Maudlin (Ortho), [facility]   POLYPECTOMY     REDUCTION MAMMAPLASTY Bilateral 01/2018   TUBAL LIGATION      SocHx  reports that she quit smoking about 29 years ago. Her smoking use included cigarettes. She has a 0.13 pack-year smoking history. She has never used smokeless tobacco. She reports current drug use. Frequency: 21.00 times per week. Drug: Marijuana. She reports that she does not drink alcohol.  Allergies  Allergen Reactions   Amoxicillin Hives   Penicillins Hives, Itching and Rash    Has patient had a PCN reaction causing immediate rash, facial/tongue/throat swelling, SOB or lightheadedness with hypotension: Yes Has patient had a PCN reaction causing severe rash involving mucus membranes or skin necrosis: No Has patient had a PCN reaction that required hospitalization: No Has patient had a PCN reaction occurring within the last 10 years: No If all of the above answers are "NO", then may proceed with Cephalosporin use.     FamHx Family History  Problem Relation Age of Onset   Lupus Mother    Heart disease  Mother    Anemia Father    Hypertension Father    Arthritis Brother    Asthma Son    Lupus Daughter    Colon cancer Neg Hx    Colon polyps Neg Hx    Esophageal cancer Neg Hx    Rectal cancer Neg Hx    Stomach cancer Neg Hx    Breast cancer Neg Hx     Prior to Admission medications   Medication Sig Start Date End Date Taking? Authorizing Provider  albuterol (VENTOLIN HFA) 108 (90 Base) MCG/ACT inhaler Inhale 1-2 puffs into the lungs every 6 (six) hours as needed for wheezing or shortness of breath. 11/21/20   Dorie Rank, MD  aspirin EC 81 MG tablet Take 81 mg by mouth daily.  Swallow whole.    [provider]  carvedilol (COREG) 25 MG tablet Take 1 tablet (25 mg total) by mouth 2 (two) times daily with a meal. 08/21/19   Anderson, Chelsey L, DO  citalopram (CELEXA) 20 MG tablet Take 20 mg by mouth daily. 11/22/19   [provider]  cycloSPORINE (RESTASIS) 0.05 % ophthalmic emulsion Place 1 drop into both eyes daily as needed (dry eyes).    [provider]  famotidine (PEPCID) 40 MG tablet Take 40 mg by mouth daily. 02/23/20   [provider]  gabapentin (NEURONTIN) 300 MG capsule Take 2-3 capsules (600-900 mg total) by mouth 2 (two) times daily. Take 600mg  every afternoon and 900mg  qhs Patient taking differently: Take 600-900 mg by mouth See admin instructions. Take 600 mg by mouth in the afternoon and 900 mg at bedtime 08/21/19   Doristine Mango L, DO  hydrOXYzine (ATARAX/VISTARIL) 25 MG tablet Take 25 mg by mouth 2 (two) times daily as needed for anxiety. 12/23/19   [provider]  losartan (COZAAR) 50 MG tablet TAKE 1 TABLET TWICE DAILY Patient taking differently: Take 50 mg by mouth daily.  02/23/20   Anderson, Chelsey L, DO  oxyCODONE (OXY IR/ROXICODONE) 5 MG immediate release tablet Take 1 tablet (5 mg total) by mouth every 6 (six) hours as needed for moderate pain, severe pain or breakthrough pain. 03/16/20   Donnie Mesa, MD  predniSONE (DELTASONE) 50 MG tablet Take 1 tablet (50 mg total) by mouth daily. 11/21/20   Dorie Rank, MD  traZODone (DESYREL) 50 MG tablet Take 1 tablet (50 mg total) by mouth at bedtime. Patient taking differently: Take 50 mg by mouth at bedtime as needed for sleep.  11/06/18   Richarda Osmond, DO    Physical Exam: Vitals:   12/07/20 0530 12/07/20 0600 12/07/20 0630 12/07/20 0700  BP: (!) 174/101 (!) 170/97 (!) 175/92 (!) 156/95  Pulse: 63 65 70 65  Resp: 16 14 16 13   Temp:      TempSrc:      SpO2: 95% 94% 93% 94%  Weight:      Height:        General: 60 y.o. female resting in bed in  NAD Eyes: PERRL, normal sclera ENMT: Nares patent w/o discharge, orophaynx clear, dentition normal, ears w/o discharge/lesions/ulcers Neck: Supple, trachea midline Cardiovascular: RRR, +S1, S2, no m/g/r, equal pulses throughout Respiratory: CTABL, no w/r/r, normal WOB, she does have upper airway transmission as she breaths through nose GI: BS+, NDNT, no masses noted, no organomegaly noted MSK: No e/c/c Skin: No rashes, bruises, ulcerations noted Neuro: A&O x 4, limited stregth eval in b/l upper extremities d/t pain, but she is uniform there, there is no  focality on exam Psyc: Appropriate interaction and affect, calm/cooperative  Labs on Admission: I have personally reviewed following labs and imaging studies  CBC: Recent Labs  Lab 12/07/20 0205  WBC 10.8*  NEUTROABS 8.1*  HGB 13.5  HCT 42.1  MCV 86.6  PLT 616   Basic Metabolic Panel: Recent Labs  Lab 12/07/20 0205  NA 142  K 4.6  CL 105  CO2 27  GLUCOSE 130*  BUN 15  CREATININE 1.03*  CALCIUM 9.5   GFR: Estimated Creatinine Clearance: 54.9 mL/min (A) (by C-G formula based on SCr of 1.03 mg/dL (H)). Liver Function Tests: Recent Labs  Lab 12/07/20 0205  AST 25  ALT 31  ALKPHOS 126  BILITOT 0.7  PROT 7.5  ALBUMIN 3.8   No results for input(s): LIPASE, AMYLASE in the last 168 hours. No results for input(s): AMMONIA in the last 168 hours. Coagulation Profile: No results for input(s): INR, PROTIME in the last 168 hours. Cardiac Enzymes: No results for input(s): CKTOTAL, CKMB, CKMBINDEX, TROPONINI in the last 168 hours. BNP (last 3 results) No results for input(s): PROBNP in the last 8760 hours. HbA1C: No results for input(s): HGBA1C in the last 72 hours. CBG: No results for input(s): GLUCAP in the last 168 hours. Lipid Profile: No results for input(s): CHOL, HDL, LDLCALC, TRIG, CHOLHDL, LDLDIRECT in the last 72 hours. Thyroid Function Tests: No results for input(s): TSH, T4TOTAL, FREET4, T3FREE, THYROIDAB  in the last 72 hours. Anemia Panel: No results for input(s): VITAMINB12, FOLATE, FERRITIN, TIBC, IRON, RETICCTPCT in the last 72 hours. Urine analysis:    Component Value Date/Time   COLORURINE YELLOW 08/19/2016 [place] 08/19/2016 1241   LABSPEC >=1.030 09/09/2018 1505   PHURINE 5.5 09/09/2018 1505   GLUCOSEU NEGATIVE 09/09/2018 1505   HGBUR NEGATIVE 09/09/2018 1505   BILIRUBINUR SMALL (A) 09/09/2018 1505   BILIRUBINUR NEG 01/03/2016 0935   KETONESUR TRACE (A) 09/09/2018 1505   PROTEINUR NEGATIVE 09/09/2018 1505   UROBILINOGEN 1.0 09/09/2018 1505   NITRITE NEGATIVE 09/09/2018 1505   LEUKOCYTESUR NEGATIVE 09/09/2018 1505    Radiological Exams on Admission: CT Head Wo Contrast  Result Date: 12/07/2020 CLINICAL DATA:  Syncope EXAM: CT HEAD WITHOUT CONTRAST TECHNIQUE: Contiguous axial images were obtained from the base of the skull through the vertex without intravenous contrast. COMPARISON:  04/06/2014 FINDINGS: Brain: No evidence of acute infarction, hemorrhage, hydrocephalus, extra-axial collection or mass lesion/mass effect. Vascular: No hyperdense vessel or unexpected calcification. Skull: Normal. Negative for fracture or focal lesion. Sinuses/Orbits: The visualized paranasal sinuses are essentially clear. The mastoid air cells are unopacified. Other: None. IMPRESSION: Normal head CT. Electronically Signed   By: Julian Hy M.D.   On: 12/07/2020 02:25    EKG: Independently reviewed. Normal sinus, no st elevation  Assessment/Plan Syncope w/ fall     - place in obs, tele     - CTH is clear, trp neg x 2     - no dyspnea, CP, tachycardia      - check echo, EEG     - PT/OT eval     - TOC consult  Polyneuropathy Hx of diabetic neuropathy Spinal cord edema     - has had "pins and needles" in palms of hands and fingers chronically, but seems to be worse after her fall; she now has pain in the hands as well; w/ normal appearing hands     - no point tenderness  on c-spine exam and she has her normal ROM for  her (she says she had chronic neck problems)     - she is not compliant on her gabapentin     - will check CT c-spine, speak with neurosurgery as she has followed recently with Dr. Reatha Armour     - UPDATE: CT result prompted MRI. Notified by radiology that there is cord edema at C4/5 and C5/6; spoke with Dr. Reatha Armour, will give neurontin; neurosurgery to see; appreciate assistance  HTN     - resume home regimen  DM2     - A1c, SSI, DM diet, follow  DVT prophylaxis: SCDs  Code Status: FULL  Family Communication: Spoke with significant other (Mr. Alroy Dust) by phone  Consults called: neurosurgery   Status is: Observation  The patient remains OBS appropriate and will d/c before 2 midnights.  Dispo: The patient is from: Home              Anticipated d/c is to: Home              Patient currently is not medically stable to d/c.   Difficult to place patient No  Time spent coordinating admission: 60 minutes  [facility]  If 7PM-7AM, please contact night-coverage [email or web address]  12/07/2020, 8:01 AM

## 2020-12-07 NOTE — ED Notes (Signed)
Patient denies shortness of breath, chest pain. Patient presents with bilateral weakness in both arms, she says it feels like "pins and needles."

## 2020-12-07 NOTE — ED Notes (Signed)
Pt back from MRI and reattached to cardiac monitor x3.

## 2020-12-07 NOTE — ED Notes (Signed)
Hospitalist at the bedside 

## 2020-12-07 NOTE — ED Notes (Signed)
Messaged hospitalist regarding pt's diet order.

## 2020-12-08 ENCOUNTER — Other Ambulatory Visit: Payer: Self-pay | Admitting: Neurological Surgery

## 2020-12-08 ENCOUNTER — Observation Stay (HOSPITAL_COMMUNITY): Payer: Medicare Other

## 2020-12-08 DIAGNOSIS — J309 Allergic rhinitis, unspecified: Secondary | ICD-10-CM | POA: Diagnosis present

## 2020-12-08 DIAGNOSIS — Z6829 Body mass index (BMI) 29.0-29.9, adult: Secondary | ICD-10-CM | POA: Diagnosis not present

## 2020-12-08 DIAGNOSIS — Z79891 Long term (current) use of opiate analgesic: Secondary | ICD-10-CM | POA: Diagnosis not present

## 2020-12-08 DIAGNOSIS — W1839XA Other fall on same level, initial encounter: Secondary | ICD-10-CM | POA: Diagnosis present

## 2020-12-08 DIAGNOSIS — R55 Syncope and collapse: Secondary | ICD-10-CM | POA: Diagnosis present

## 2020-12-08 DIAGNOSIS — Z20822 Contact with and (suspected) exposure to covid-19: Secondary | ICD-10-CM | POA: Diagnosis present

## 2020-12-08 DIAGNOSIS — F419 Anxiety disorder, unspecified: Secondary | ICD-10-CM | POA: Diagnosis present

## 2020-12-08 DIAGNOSIS — Z7952 Long term (current) use of systemic steroids: Secondary | ICD-10-CM | POA: Diagnosis not present

## 2020-12-08 DIAGNOSIS — E785 Hyperlipidemia, unspecified: Secondary | ICD-10-CM | POA: Diagnosis present

## 2020-12-08 DIAGNOSIS — Z9049 Acquired absence of other specified parts of digestive tract: Secondary | ICD-10-CM | POA: Diagnosis not present

## 2020-12-08 DIAGNOSIS — Z87891 Personal history of nicotine dependence: Secondary | ICD-10-CM | POA: Diagnosis not present

## 2020-12-08 DIAGNOSIS — G8929 Other chronic pain: Secondary | ICD-10-CM | POA: Diagnosis present

## 2020-12-08 DIAGNOSIS — E669 Obesity, unspecified: Secondary | ICD-10-CM | POA: Diagnosis present

## 2020-12-08 DIAGNOSIS — Z8249 Family history of ischemic heart disease and other diseases of the circulatory system: Secondary | ICD-10-CM | POA: Diagnosis not present

## 2020-12-08 DIAGNOSIS — Z7982 Long term (current) use of aspirin: Secondary | ICD-10-CM | POA: Diagnosis not present

## 2020-12-08 DIAGNOSIS — S14124A Central cord syndrome at C4 level of cervical spinal cord, initial encounter: Secondary | ICD-10-CM | POA: Diagnosis present

## 2020-12-08 DIAGNOSIS — M2578 Osteophyte, vertebrae: Secondary | ICD-10-CM | POA: Diagnosis present

## 2020-12-08 DIAGNOSIS — E1142 Type 2 diabetes mellitus with diabetic polyneuropathy: Secondary | ICD-10-CM | POA: Diagnosis present

## 2020-12-08 DIAGNOSIS — Z79899 Other long term (current) drug therapy: Secondary | ICD-10-CM | POA: Diagnosis not present

## 2020-12-08 DIAGNOSIS — F32A Depression, unspecified: Secondary | ICD-10-CM | POA: Diagnosis present

## 2020-12-08 DIAGNOSIS — I10 Essential (primary) hypertension: Secondary | ICD-10-CM | POA: Diagnosis present

## 2020-12-08 DIAGNOSIS — G4733 Obstructive sleep apnea (adult) (pediatric): Secondary | ICD-10-CM | POA: Diagnosis present

## 2020-12-08 DIAGNOSIS — Z8261 Family history of arthritis: Secondary | ICD-10-CM | POA: Diagnosis not present

## 2020-12-08 DIAGNOSIS — M4712 Other spondylosis with myelopathy, cervical region: Secondary | ICD-10-CM | POA: Diagnosis present

## 2020-12-08 DIAGNOSIS — M4802 Spinal stenosis, cervical region: Secondary | ICD-10-CM | POA: Diagnosis present

## 2020-12-08 LAB — HIV ANTIBODY (ROUTINE TESTING W REFLEX): HIV Screen 4th Generation wRfx: NONREACTIVE

## 2020-12-08 LAB — COMPREHENSIVE METABOLIC PANEL
ALT: 49 U/L — ABNORMAL HIGH (ref 0–44)
AST: 53 U/L — ABNORMAL HIGH (ref 15–41)
Albumin: 3.3 g/dL — ABNORMAL LOW (ref 3.5–5.0)
Alkaline Phosphatase: 133 U/L — ABNORMAL HIGH (ref 38–126)
Anion gap: 4 — ABNORMAL LOW (ref 5–15)
BUN: 15 mg/dL (ref 6–20)
CO2: 27 mmol/L (ref 22–32)
Calcium: 8.6 mg/dL — ABNORMAL LOW (ref 8.9–10.3)
Chloride: 106 mmol/L (ref 98–111)
Creatinine, Ser: 0.91 mg/dL (ref 0.44–1.00)
GFR, Estimated: >60 mL/min (ref >60)
Glucose, Bld: 95 mg/dL (ref 70–99)
Potassium: 3.8 mmol/L (ref 3.5–5.1)
Sodium: 137 mmol/L (ref 135–145)
Total Bilirubin: 0.8 mg/dL (ref 0.3–1.2)
Total Protein: 6.7 g/dL (ref 6.5–8.1)

## 2020-12-08 LAB — CBC
HCT: 42.9 % (ref 36.0–46.0)
Hemoglobin: 13.7 g/dL (ref 12.0–15.0)
MCH: 27.5 pg (ref 26.0–34.0)
MCHC: 31.9 g/dL (ref 30.0–36.0)
MCV: 86.1 fL (ref 80.0–100.0)
Platelets: 253 10*3/uL (ref 150–400)
RBC: 4.98 MIL/uL (ref 3.87–5.11)
RDW: 15.9 % — ABNORMAL HIGH (ref 11.5–15.5)
WBC: 7.4 10*3/uL (ref 4.0–10.5)
nRBC: 0 % (ref 0.0–0.2)

## 2020-12-08 LAB — GLUCOSE, CAPILLARY
Glucose-Capillary: 110 mg/dL — ABNORMAL HIGH (ref 70–99)
Glucose-Capillary: 104 mg/dL — ABNORMAL HIGH (ref 70–99)
Glucose-Capillary: 175 mg/dL — ABNORMAL HIGH (ref 70–99)
Glucose-Capillary: 136 mg/dL — ABNORMAL HIGH (ref 70–99)

## 2020-12-08 LAB — ECHOCARDIOGRAM COMPLETE
Area-P 1/2: 3.61 cm2
Height: 62 in
S' Lateral: 2.2 cm
Weight: 2560 oz

## 2020-12-08 NOTE — Evaluation (Addendum)
Occupational Therapy Evaluation Patient Details Name: Kristen [name] MRN: [id number] DOB: Feb 27, 1961 Today's Date: 12/08/2020    History of Present Illness Kristen [name] is a 60 y.o. female presenting after syncopal episode and fall. Pt also with recent hx of numbness/tinglinig in B hands. Imaging shows progressive degenerative changes, stenosis at C4-5 and C5-6 levels, and central cord syndrome. Neurosurgeon recommends operative mgmt of ACDF, pending pt decision. PMH: DM, polyneuropathy, HTN,, HLD, chronic neck/back pain   Clinical Impression   PTA, pt lives with fiance and reports typically Modified Independent with ADLs, IADLs and mobility using cane. Pt endorses progressive balance deficits with intermittent LOB during mobility without AD, requiring consistent min guard. Pt stability improved with use of RW in room. Pt overall min guard for UB/LB ADLs completed in standing with noted impaired strength/sensation of B hands and difficulty holding ADL items. Educated on fall prevention strategies with ADLs/mobility at home with plans to further address at acute level. If pt opting to have ACDF as recommended, will finalize OT recs s/p procedure. At this time, would recommend direct supervision for daily tasks at discharge until surgery.   Orthostatic BP readings: Lying: 135/83, 72 HR Sitting: 136/88, 69 HR Standing: 135/91, 82 HR    Follow Up Recommendations  Other (comment) (direct supervision for ADLs/mobility; OT follow-up TBD s/p surgery)    Equipment Recommendations  Other (comment) (Rolling walker)    Recommendations for Other Services       Precautions / Restrictions Precautions Precautions: Fall Restrictions Weight Bearing Restrictions: No      Mobility Bed Mobility Overal bed mobility: Independent                  Transfers Overall transfer level: Needs assistance Equipment used: None;Rolling walker (2 wheeled) Transfers: Sit to/from Stand Sit to  Stand: Min guard         General transfer comment: min guard with increased time/effort to rise. improved with BUE support    Balance Overall balance assessment: Needs assistance Sitting-balance support: Feet supported;No upper extremity supported Sitting balance-Leahy Scale: Fair     Standing balance support: No upper extremity supported;During functional activity Standing balance-Leahy Scale: Fair Standing balance comment: fair static standing, unable to accept challenges without LOB                           ADL either performed or assessed with clinical judgement   ADL Overall ADL's : Needs assistance/impaired Eating/Feeding: Sitting;Set up   Grooming: Min guard;Standing;Oral care Grooming Details (indicate cue type and reason): min guard with unsteadiness noted, able to self correct some LOB Upper Body Bathing: Min guard;Standing Upper Body Bathing Details (indicate cue type and reason): min guard for balance, bathed under arms standing at sink Lower Body Bathing: Min guard;Sit to/from stand Lower Body Bathing Details (indicate cue type and reason): min guard to bathe LB standing at sink, LOB with min guard to correct     Lower Body Dressing: Min guard;Sit to/from stand Lower Body Dressing Details (indicate cue type and reason): min guard to don mesh underwear at bedside. doffed personal underwear in standing with increased fall risk noted. Educated and encouraged pt to complete LB ADLs seated to decrease fall risk Toilet Transfer: Min guard;Ambulation   Toileting- Clothing Manipulation and Hygiene: Min guard       Functional mobility during ADLs: Min guard;Rolling walker General ADL Comments: Pt with B hand weakness and decreased sensation impacting ability to hold  ADL items. Pt with significant unsteadiness without use of AD, improved with RW and pt reports feeling more steady     Vision Baseline Vision/History: Wears glasses Wears Glasses: Reading  only Patient Visual Report: No change from baseline Vision Assessment?: No apparent visual deficits     Perception     Praxis      Pertinent Vitals/Pain Pain Assessment: No/denies pain     Hand Dominance Right   Extremity/Trunk Assessment Upper Extremity Assessment Upper Extremity Assessment: RUE deficits/detail;LUE deficits/detail RUE Deficits / Details: numbness/tingling in B hands. coordination slow but intact, 3-/5 grip strength. difficulty holding to objects, difficulty with composite flexion of digits during tasks. RUE Sensation:  (pins and needles) RUE Coordination: decreased fine motor LUE Deficits / Details: numbness/tingling in B hands. coordination slow but intact, 3-/5 grip strength. difficulty holding to objects, difficulty with composite flexion of digits during tasks. LUE Sensation:  (pins and needles) LUE Coordination: decreased fine motor   Lower Extremity Assessment Lower Extremity Assessment: Defer to PT evaluation   Cervical / Trunk Assessment Cervical / Trunk Assessment: Normal   Communication Communication Communication: No difficulties   Cognition Arousal/Alertness: Awake/alert Behavior During Therapy: Flat affect Overall Cognitive Status: Within Functional Limits for tasks assessed                                     General Comments  orthostatic BPs negative, denies dizziness or lightheadedness with mobility    Exercises     Shoulder Instructions      Home Living Family/patient expects to be discharged to:: Private residence Living Arrangements: Spouse/significant other Available Help at Discharge: Family Type of Home: House Home Access: Level entry     Home Layout: One level     Bathroom Shower/Tub: Teacher, early years/pre: Standard     Home Equipment: Cane - quad;Cane - single point   Additional Comments: reports access to a shower chair if needed      Prior Functioning/Environment Level of  Independence: Independent with assistive device(s)        Comments: has been using cane recently for mobility due to progressive balance deficits. Pt reports Modified Independence with ADLs/IADLs in the home, walks her beagle often. Unclear if additional falls at home other than the one leading up to this admission        OT Problem List: Decreased strength;Impaired balance (sitting and/or standing);Decreased coordination;Impaired sensation;Decreased knowledge of use of DME or AE      OT Treatment/Interventions: Self-care/ADL training;Therapeutic exercise;DME and/or AE instruction;Therapeutic activities;Patient/family education;Balance training    OT Goals(Current goals can be found in the care plan section) Acute Rehab OT Goals Patient Stated Goal: resolve hand issues, leaning towards having surgery OT Goal Formulation: With patient Time For Goal Achievement: 12/22/20 Potential to Achieve Goals: Good ADL Goals Pt Will Perform Lower Body Bathing: with modified independence;sit to/from stand Pt Will Transfer to Toilet: with modified independence;ambulating Pt/caregiver will Perform Home Exercise Program: Increased strength;Both right and left upper extremity;Independently;With written HEP provided Additional ADL Goal #1: Pt to verbalize at least 3 fall prevention strategies to implement in the home.  OT Frequency: Min 2X/week   Barriers to D/C:            Co-evaluation              AM-PAC OT "6 Clicks" Daily Activity     Outcome Measure Help from another person  eating meals?: A Little Help from another person taking care of personal grooming?: A Little Help from another person toileting, which includes using toliet, bedpan, or urinal?: A Little Help from another person bathing (including washing, rinsing, drying)?: A Little Help from another person to put on and taking off regular upper body clothing?: A Little Help from another person to put on and taking off regular  lower body clothing?: A Little 6 Click Score: 18   End of Session Equipment Utilized During Treatment: Rolling walker Nurse Communication: Mobility status  Activity Tolerance: Patient tolerated treatment well Patient left: in bed;with call bell/phone within reach;with bed alarm set (sitting EOB brushing hair)  OT Visit Diagnosis: Unsteadiness on feet (R26.81);Other abnormalities of gait and mobility (R26.89);Muscle weakness (generalized) (M62.81)                Time: [id number] OT Time Calculation (min): 25 min Charges:  OT General Charges $OT Visit: 1 Visit OT Evaluation $OT Eval Low Complexity: 1 Low OT Treatments $Self Care/Home Management : 8-22 mins  Malachy Chamber, OTR/L Acute Rehab Services Office: [phone]   Layla Maw 12/08/2020, 7:53 AM

## 2020-12-08 NOTE — Progress Notes (Signed)
Echocardiogram 2D Echocardiogram has been performed.  Oneal Deputy Herberto Ledwell RDCS 12/08/2020, 8:42 AM

## 2020-12-08 NOTE — Progress Notes (Signed)
PROGRESS NOTE    Kristen [name]  [id number] DOB: August 31, 1960 DOA: 12/06/2020 PCP: Dulce Sellar, MD    Brief Narrative:  60 year old female with history of type 2 diabetes, polyneuropathy, hypertension, hyperlipidemia and chronic back and neck pain presented to the emergency room with an episode of syncope with fall.  Patient does have chronic neck pain issues and being followed by neurosurgery.  The night before admission, she was cooking dinner for her family, felt flushed, vision got blurry while she was walking and then passed out for 2 seconds.  She did smack her lip and had some bleeding.  No postictal confusion or drowsiness.  Events did not recur again. In the emergency room hemodynamically stable.  Head CT was negative.  Trauma series with CT scan of the neck with extensive osteoarthritis.  MRI showed significant foraminal narrowing C4-5 and some cord edema related to trauma.  Seen by neurosurgery.  Recommended admission and surgical intervention.   Assessment & Plan:   Active Problems:   Syncope  Syncopal episode: An isolated syncopal event in a patient with diabetic neuropathy and neck pain.  Probably vasovagal syncope.  No other demonstrable cause found. EKG and telemetry with no events.  Echocardiogram is essentially normal. Will check orthostatic blood pressures.  Mobilize with precautions.  Cervical spine disease: Significant cervical spine disease with multilevel spinal compression.  No acute neurological deficits. Seen by neurosurgery, recommended starting gabapentin and pain medication.  Continue mobility. Neurosurgery planning for spinal fixation on 7/26 at [facility].  Essential hypertension: All home medications were resumed.  Will check orthostatic as above.  Type 2 diabetes: Diet controlled at home.  No indication for treatment.  Monitor blood sugars while in the hospital.  Care discussed with neurosurgery Dr. Reatha Armour.  Planning for cervical  spine fixation on 7/26.  Recommended transfer to [facility] under neurosurgery service. Hospitalist will sign off once patient arrives to [facility].  If any medical needs, please call for consult.  DVT prophylaxis: SCDs Start: 12/07/20 1948   Code Status: Full code Family Communication: None Disposition Plan: Status is: Observation  The patient will require care spanning > 2 midnights and should be moved to inpatient because: Ongoing diagnostic testing needed not appropriate for outpatient work up  Dispo: The patient is from: Home              Anticipated d/c is to: Home              Patient currently is not medically stable to d/c.   Difficult to place patient No         Consultants:  Neurosurgery  Procedures:  None  Antimicrobials:  None   Subjective: Patient seen and examined.  Complains of pain bilateral arms and some tingling.  No neck pain.  Working with occupational therapist.  No other overnight events.  No recurrent symptoms.  Telemetry with no arrhythmias.  Objective: Vitals:   12/08/20 0443 12/08/20 0821 12/08/20 0929 12/08/20 1107  BP: (!) 157/117  134/89 117/89  Pulse: 79  68 71  Resp: '17  16 20  '$ Temp: 98.4 F (36.9 C)  97.9 F (36.6 C) 98.8 F (37.1 C)  TempSrc: Oral  Oral Oral  SpO2: 100% 99% 100% 98%  Weight:      Height:        Intake/Output Summary (Last 24 hours) at 12/08/2020 1303 Last data filed at 12/08/2020 1302 Gross per 24 hour  Intake 723 ml  Output 1500  ml  Net -777 ml   Filed Weights   12/06/20 2113  Weight: 72.6 kg    Examination:  General exam: Appears calm and comfortable  Respiratory system: Clear to auscultation. Respiratory effort normal. Cardiovascular system: S1 & S2 heard, RRR. No JVD, murmurs, rubs, gallops or clicks. No pedal edema. Gastrointestinal system: Abdomen is nondistended, soft and nontender. No organomegaly or masses felt. Normal bowel sounds heard. Central nervous system: Alert and  oriented.  Patient does have bilateral upper extremity weakness 4/5, some limitation with pain on the left arm.   Data Reviewed: I have personally reviewed following labs and imaging studies  CBC: Recent Labs  Lab 12/07/20 0205 12/08/20 0343  WBC 10.8* 7.4  NEUTROABS 8.1*  --   HGB 13.5 13.7  HCT 42.1 42.9  MCV 86.6 86.1  PLT 257 [id number]   Basic Metabolic Panel: Recent Labs  Lab 12/07/20 0205 12/08/20 0343  NA 142 137  K 4.6 3.8  CL 105 106  CO2 27 27  GLUCOSE 130* 95  BUN 15 15  CREATININE 1.03* 0.91  CALCIUM 9.5 8.6*   GFR: Estimated Creatinine Clearance: 62.1 mL/min (by C-G formula based on SCr of 0.91 mg/dL). Liver Function Tests: Recent Labs  Lab 12/07/20 0205 12/08/20 0343  AST 25 53*  ALT 31 49*  ALKPHOS 126 133*  BILITOT 0.7 0.8  PROT 7.5 6.7  ALBUMIN 3.8 3.3*   No results for input(s): LIPASE, AMYLASE in the last 168 hours. No results for input(s): AMMONIA in the last 168 hours. Coagulation Profile: No results for input(s): INR, PROTIME in the last 168 hours. Cardiac Enzymes: No results for input(s): CKTOTAL, CKMB, CKMBINDEX, TROPONINI in the last 168 hours. BNP (last 3 results) No results for input(s): PROBNP in the last 8760 hours. HbA1C: Recent Labs    12/07/20 2031  HGBA1C 6.4*   CBG: Recent Labs  Lab 12/07/20 1457 12/08/20 0905 12/08/20 1241  GLUCAP 93 110* 104*   Lipid Profile: No results for input(s): CHOL, HDL, LDLCALC, TRIG, CHOLHDL, LDLDIRECT in the last 72 hours. Thyroid Function Tests: No results for input(s): TSH, T4TOTAL, FREET4, T3FREE, THYROIDAB in the last 72 hours. Anemia Panel: No results for input(s): VITAMINB12, FOLATE, FERRITIN, TIBC, IRON, RETICCTPCT in the last 72 hours. Sepsis Labs: No results for input(s): PROCALCITON, LATICACIDVEN in the last 168 hours.  No results found for this or any previous visit (from the past 240 hour(s)).       Radiology Studies: CT Head Wo Contrast  Result Date:  12/07/2020 CLINICAL DATA:  Syncope EXAM: CT HEAD WITHOUT CONTRAST TECHNIQUE: Contiguous axial images were obtained from the base of the skull through the vertex without intravenous contrast. COMPARISON:  04/06/2014 FINDINGS: Brain: No evidence of acute infarction, hemorrhage, hydrocephalus, extra-axial collection or mass lesion/mass effect. Vascular: No hyperdense vessel or unexpected calcification. Skull: Normal. Negative for fracture or focal lesion. Sinuses/Orbits: The visualized paranasal sinuses are essentially clear. The mastoid air cells are unopacified. Other: None. IMPRESSION: Normal head CT. Electronically Signed   By: Julian Hy M.D.   On: 12/07/2020 02:25   CT CERVICAL SPINE WO CONTRAST  Result Date: 12/07/2020 CLINICAL DATA:  Fall. Bilateral arm weakness with pins and needle sensation. EXAM: CT CERVICAL SPINE WITHOUT CONTRAST TECHNIQUE: Multidetector CT imaging of the cervical spine was performed without intravenous contrast. Multiplanar CT image reconstructions were also generated. COMPARISON:  02/22/2012 cervical MRI FINDINGS: Alignment: Generalized straightening of the cervical spine with slight degenerative anterolisthesis at C4-5 Skull base and vertebrae: Negative  for fracture or bone lesion. Soft tissues and spinal canal: No prevertebral fluid or swelling. No visible canal hematoma. Partial retropharyngeal course of the carotids. Disc levels: C2-3: Facet spurring on the left C3-4: Disc narrowing with bilateral uncovertebral spurring causing moderate foraminal stenosis. C4-5: Advanced left facet osteoarthritis with spurring and joint distortion. Mild anterolisthesis. Disc space narrowing with left uncovertebral ridging. Moderate left foraminal narrowing C5-6: Disc narrowing with endplate and uncovertebral ridging eccentric to the right where there is chronic cord impingement by MRI. Right foraminal impingement. Mild left foraminal stenosis C6-7: Unremarkable. C7-T1:Unremarkable. Upper  chest: Negative IMPRESSION: 1. No acute fracture. 2. Cervical spine degeneration which is long-standing based on a 2013 cervical MRI. There is spinal stenosis with ridge contacting the ventral cord at C5-6, consider cord MRI given the provided history. 3. Foraminal stenosis at C3-4 to C5-6, as above. Electronically Signed   By: Monte Fantasia M.D.   On: 12/07/2020 10:00   MR CERVICAL SPINE WO CONTRAST  Result Date: 12/07/2020 CLINICAL DATA:  Bilateral arm weakness with pins and needle sensation after a fall today. EXAM: MRI CERVICAL SPINE WITHOUT CONTRAST TECHNIQUE: Multiplanar, multisequence MR imaging of the cervical spine was performed. No intravenous contrast was administered. COMPARISON:  CT cervical spine today. MRI cervical spine 02/22/2012. FINDINGS: Alignment: There is trace anterolisthesis C4 on C5 and 0.2 cm retrolisthesis C5 on C6. Vertebrae: No fracture is identified. Marrow edema about the C5-6 level is likely degenerative. Cord: Mild, subtle hazy edema is seen within the cervical cord at C4-5 and C5-6. Posterior Fossa, vertebral arteries, paraspinal tissues: Mild edema is seen between the C5 and C6 spinous processes. Disc levels: C2-3: Left worse than right facet degenerative change. Mild uncovertebral spurring. Moderate left foraminal narrowing. The central canal and right foramen are open. C3-4: Disc osteophyte complex, uncovertebral disease and mild-to-moderate facet arthropathy result in mild flattening of the ventral cord. Moderate to moderately severe bilateral foraminal narrowing. Left foraminal narrowing appears slightly worse than on the prior study. C4-5: Left much worse than right facet degenerative disease has progressed. The disc is uncovered with a disc osteophyte complex and uncovertebral spurring, worse on the left. There is some ligamentum flavum thickening. The cord is deformed and has a triangular configuration. Severe left and moderate right foraminal narrowing. C5-6: Disc  osteophyte complex and uncovertebral spurring, worse on the right. Deformity of the cord is worse on the right where the cord is flattened. Moderately severe to severe bilateral foraminal narrowing. Spondylosis has progressed since the prior exam. C6-7: Shallow disc bulge without stenosis. C7-T1: Negative. IMPRESSION: Mild, hazy edema within the cord at C4-5 and C5-6 is likely secondary to trauma superimposed on advanced degenerative disease at these levels given the patient's history. Spondylosis has progressed at both levels since the prior MRI and the cord is deformed at both levels. There is also severe left foraminal narrowing at C4-5 and moderately severe to severe bilateral foraminal narrowing at C5-6. Mild edema between the C5-6 spinous processes is worrisome for ligamentous injury. Negative for fracture. Edema about the C4-5 level is likely degenerative. Degenerative disease causes mild deformity of the ventral cord at C3-4 where there is moderate to moderately severe bilateral foraminal narrowing. Spondylosis shows some progression since the prior MRI. Critical Value/emergent results were called by telephone at the time of interpretation on 12/07/2020 at 2:34 pm to provider Cherylann Ratel , who verbally acknowledged these results. Electronically Signed   By: Inge Rise M.D.   On: 12/07/2020 14:34   ECHOCARDIOGRAM  COMPLETE  Result Date: 12/08/2020    ECHOCARDIOGRAM REPORT   Patient Name:   AZIRA MCCLELAND [facility] Date of Exam: 12/08/2020 Medical Rec #:  [id number]           Height:       62.0 in Accession #:    [id number]          Weight:       160.0 lb Date of Birth:  12/02/1960            BSA:          1.739 m Patient Age:    40 years            BP:           157/117 mmHg Patient Gender: F                   HR:           69 bpm. Exam Location:  Inpatient Procedure: 2D Echo, Color Doppler and Cardiac Doppler Indications:    R55 Syncope  History:        Patient has no prior history of Echocardiogram  examinations.                 Risk Factors:Hypertension, Dyslipidemia and Sleep Apnea.  Sonographer:    Raquel Sarna Senior RDCS Referring Phys: [id number] Oliver  1. Left ventricular ejection fraction, by estimation, is 60 to 65%. The left ventricle has normal function. The left ventricle has no regional wall motion abnormalities. Left ventricular diastolic parameters are consistent with Grade I diastolic dysfunction (impaired relaxation).  2. Right ventricular systolic function is normal. The right ventricular size is normal.  3. The mitral valve is normal in structure. No evidence of mitral valve regurgitation. No evidence of mitral stenosis.  4. The aortic valve is tricuspid. Aortic valve regurgitation is not visualized. No aortic stenosis is present.  5. The inferior vena cava is normal in size with greater than 50% respiratory variability, suggesting right atrial pressure of 3 mmHg. FINDINGS  Left Ventricle: Left ventricular ejection fraction, by estimation, is 60 to 65%. The left ventricle has normal function. The left ventricle has no regional wall motion abnormalities. The left ventricular internal cavity size was normal in size. There is  no left ventricular hypertrophy. Left ventricular diastolic parameters are consistent with Grade I diastolic dysfunction (impaired relaxation). Right Ventricle: The right ventricular size is normal. Right ventricular systolic function is normal. Left Atrium: Left atrial size was normal in size. Right Atrium: Right atrial size was normal in size. Pericardium: Trivial pericardial effusion is present. Mitral Valve: The mitral valve is normal in structure. No evidence of mitral valve regurgitation. No evidence of mitral valve stenosis. Tricuspid Valve: The tricuspid valve is normal in structure. Tricuspid valve regurgitation is trivial. No evidence of tricuspid stenosis. Aortic Valve: The aortic valve is tricuspid. Aortic valve regurgitation is not visualized. No  aortic stenosis is present. Pulmonic Valve: The pulmonic valve was normal in structure. Pulmonic valve regurgitation is not visualized. No evidence of pulmonic stenosis. Aorta: The aortic root is normal in size and structure. Venous: The inferior vena cava is normal in size with greater than 50% respiratory variability, suggesting right atrial pressure of 3 mmHg. IAS/Shunts: The interatrial septum was not well visualized.  LEFT VENTRICLE PLAX 2D LVIDd:         4.00 cm  Diastology LVIDs:         2.20 cm  LV e'  medial:    6.85 cm/s LV PW:         1.10 cm  LV E/e' medial:  9.9 LV IVS:        1.00 cm  LV e' lateral:   5.66 cm/s LVOT diam:     1.80 cm  LV E/e' lateral: 12.0 LV SV:         62 LV SV Index:   35 LVOT Area:     2.54 cm                          3D Volume EF:                         3D EF:        62 %                         LV EDV:       96 ml                         LV ESV:       36 ml                         LV SV:        60 ml RIGHT VENTRICLE RV S prime:     10.30 cm/s TAPSE (M-mode): 2.0 cm LEFT ATRIUM             Index       RIGHT ATRIUM           Index LA diam:        3.40 cm 1.96 cm/m  RA Area:     12.40 cm LA Vol (A2C):   37.9 ml 21.80 ml/m RA Volume:   27.40 ml  15.76 ml/m LA Vol (A4C):   41.3 ml 23.75 ml/m LA Biplane Vol: 40.0 ml 23.01 ml/m  AORTIC VALVE LVOT Vmax:   112.00 cm/s LVOT Vmean:  84.000 cm/s LVOT VTI:    0.242 m  AORTA Ao Root diam: 2.80 cm Ao Asc diam:  3.20 cm MITRAL VALVE MV Area (PHT): 3.61 cm    SHUNTS MV Decel Time: 210 msec    Systemic VTI:  0.24 m MV E velocity: 68.10 cm/s  Systemic Diam: 1.80 cm MV A velocity: 76.30 cm/s MV E/A ratio:  0.89 Kirk Ruths MD Electronically signed by Kirk Ruths MD Signature Date/Time: 12/08/2020/10:09:22 AM    Final         Scheduled Meds:  atorvastatin  10 mg Oral Daily   carvedilol  25 mg Oral BID WC   gabapentin  300 mg Oral TID   insulin aspart  0-15 Units Subcutaneous TID WC   losartan  50 mg Oral Daily    mometasone-formoterol  2 puff Inhalation BID   sodium chloride flush  3 mL Intravenous Q12H   Vitamin D (Ergocalciferol)  50,000 Units Oral Weekly   Continuous Infusions:   LOS: 0 days    Time spent: 30 minutes    Barb Merino, MD [facility] Pager [phone]

## 2020-12-08 NOTE — Progress Notes (Signed)
I called the patient via telephone.  We discussed surgical intervention, she agrees to proceed.  Therefore she will be transferred to [facility] for planned surgical intervention on Tuesday.  I answered all of her questions about surgical intervention and recovery.   Elwin Sleight, DO Neurosurgeon

## 2020-12-08 NOTE — Evaluation (Signed)
Physical Therapy Evaluation Patient Details Name: Kristen [name] MRN: [id number] DOB: [birth year] Today's Date: 12/08/2020   History of Present Illness  Kristen [name] is a 60 y.o. female presenting after syncopal episode and fall. Pt also with recent hx of numbness/tinglinig in B hands. Imaging shows progressive degenerative changes, stenosis at C4-5 and C5-6 levels, and central cord syndrome. Neurosurgeon recommends operative mgmt of ACDF, pending pt decision. PMH: DM, polyneuropathy, HTN,, HLD, chronic neck/back pain  Clinical Impression  Pt is a 60 y.o. female with above HPI resulting in the deficits listed below (see PT Problem List). Pt demonstrated decreased activity tolerance, increased pain limiting function, and balance deficits during mobility. Pt reports she may decide on having surgery (ACDF has been recommended) as she is having increasing discomfort that she hopes the surgery will alleviate. Pt is currently at safe mobility level for d/c home with supervision for mobility/OOB. PT d/c recs may change pending mobility level following surgery if received. Pt is currently below baseline mobility level and will benefit from skilled PT to increase their independence and safety with mobility.     Follow Up Recommendations Supervision for mobility/OOB (Pt may be receiving surgery next week, d/c recs may change pending mobility following)    Equipment Recommendations  Rolling walker with 5" wheels    Recommendations for Other Services       Precautions / Restrictions Precautions Precautions: Fall Restrictions Weight Bearing Restrictions: No      Mobility  Bed Mobility Overal bed mobility: Independent             General bed mobility comments: HOB elevated. Pt able to perform supine to sit safely without cuing    Transfers Overall transfer level: Needs assistance Equipment used: Rolling walker (2 wheeled);None Transfers: Sit to/from Stand Sit to Stand: Min  guard         General transfer comment: x3 from EOB, toilet, and recliner. MIN guard for safety with use of RW to rise from EOB and recliner. Pt able to rise from toilet with single UE support on surface and no LOB.  Ambulation/Gait Ambulation/Gait assistance: Min guard Gait Distance (Feet): 130 Feet Assistive device: Rolling walker (2 wheeled) Gait Pattern/deviations: Step-through pattern;Staggering left Gait velocity: decr   General Gait Details: MIN guard for safety with ambulation. Pt with x1 stagger step due to poor balance, but able to maintain standing with MIN guard and assist of B UE support on RW. Pt with decreased WB through RW due to pain in B hands.  Stairs            Wheelchair Mobility    Modified Rankin (Stroke Patients Only)       Balance Overall balance assessment: Needs assistance Sitting-balance support: Feet supported;No upper extremity supported Sitting balance-Leahy Scale: Fair     Standing balance support: No upper extremity supported;Bilateral upper extremity supported;During functional activity Standing balance-Leahy Scale: Fair Standing balance comment: pt with fair static standing balance with rise from toilet seat; require use of RW for stability with dynamic standing tasks                             Pertinent Vitals/Pain Pain Assessment: 0-10 Pain Score: 10-Worst pain ever Pain Location: B hands Pain Descriptors / Indicators: Pins and needles Pain Intervention(s): Monitored during session;Limited activity within patient's tolerance    Home Living Family/patient expects to be discharged to:: Private residence Living Arrangements: Spouse/significant other Available Help at  Discharge: Family Type of Home: House Home Access: Level entry     Home Layout: One level Home Equipment: Cane - quad;Cane - single point Additional Comments: lives with fiance who can provide 24/7 supervision. children live close. reports access to  a shower chair if needed    Prior Function Level of Independence: Independent with assistive device(s)         Comments: cane since last back surgery, pt reports she has had x3 falls in the past year due to poor balance.     Hand Dominance   Dominant Hand: Right    Extremity/Trunk Assessment   Upper Extremity Assessment Upper Extremity Assessment: RUE deficits/detail RUE Deficits / Details: pt with pins/needles feeling in B hands. Light touch sensation UE/cervical region sensation intact. Intensity of pins/needles pain increased with light touch LUE Deficits / Details: pt with pins/needles feeling in B hands. Light touch sensation UE/cervical region sensation intact. Intensity of pins/needles pain increased with light touch    Lower Extremity Assessment Lower Extremity Assessment: Generalized weakness    Cervical / Trunk Assessment Cervical / Trunk Assessment: Normal  Communication   Communication: No difficulties  Cognition Arousal/Alertness: Awake/alert Behavior During Therapy: Flat affect Overall Cognitive Status: Within Functional Limits for tasks assessed                                        General Comments      Exercises     Assessment/Plan    PT Assessment Patient needs continued PT services  PT Problem List Decreased activity tolerance;Decreased balance;Decreased mobility;Pain;Decreased strength       PT Treatment Interventions DME instruction;Gait training;Therapeutic activities;Therapeutic exercise;Patient/family education;Functional mobility training;Balance training    PT Goals (Current goals can be found in the Care Plan section)  Acute Rehab PT Goals Patient Stated Goal: resolve hand issues, leaning towards having surgery PT Goal Formulation: With patient Time For Goal Achievement: 12/22/20    Frequency Min 3X/week   Barriers to discharge        Co-evaluation               AM-PAC PT "6 Clicks" Mobility  Outcome  Measure Help needed turning from your back to your side while in a flat bed without using bedrails?: None Help needed moving from lying on your back to sitting on the side of a flat bed without using bedrails?: None Help needed moving to and from a bed to a chair (including a wheelchair)?: A Little Help needed standing up from a chair using your arms (e.g., wheelchair or bedside chair)?: A Little Help needed to walk in hospital room?: A Little Help needed climbing 3-5 steps with a railing? : A Lot 6 Click Score: 19    End of Session Equipment Utilized During Treatment: Gait belt Activity Tolerance: Patient tolerated treatment well Patient left: in chair;with call bell/phone within reach;with chair alarm set Nurse Communication: Mobility status PT Visit Diagnosis: Unsteadiness on feet (R26.81);Pain;History of falling (Z91.81) Pain - Right/Left:  (Bilateral) Pain - part of body: Hand    Time: [id number] PT Time Calculation (min) (ACUTE ONLY): 25 min   Charges:   PT Evaluation $PT Eval Low Complexity: 1 Low PT Treatments $Therapeutic Activity: 8-22 mins        Festus Barren PT, DPT  Acute Rehabilitation Services  Office [phone]   12/08/2020, 3:03 PM

## 2020-12-09 LAB — GLUCOSE, CAPILLARY
Glucose-Capillary: 118 mg/dL — ABNORMAL HIGH (ref 70–99)
Glucose-Capillary: 113 mg/dL — ABNORMAL HIGH (ref 70–99)
Glucose-Capillary: 111 mg/dL — ABNORMAL HIGH (ref 70–99)

## 2020-12-09 LAB — SARS CORONAVIRUS 2 (TAT 6-24 HRS): SARS Coronavirus 2: NEGATIVE

## 2020-12-09 NOTE — Plan of Care (Signed)
  Problem: Safety: Goal: Ability to remain free from injury will improve Outcome: Progressing   

## 2020-12-09 NOTE — Progress Notes (Signed)
Pt transferred to [facility] via carelink, pt alert, VSS & no s/s of respiratory distress O2 sat 95% on RA.

## 2020-12-09 NOTE — Progress Notes (Signed)
Pt arrived to floor via carelink. No signs of respiratory distress. C/O pain to arms , hands, and fingers. Pt stated it was related to her fall PTA.

## 2020-12-09 NOTE — Progress Notes (Signed)
Patient ID: Kristen [name], female   DOB: April 06, 1961, 60 y.o.   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

## 2020-12-09 NOTE — Progress Notes (Signed)
Physical Therapy Treatment Patient Details Name: Kristen [name] MRN: [id number] DOB: 29-Mar-1961 Today's Date: 12/09/2020    History of Present Illness 60 y.o. female presenting after syncopal episode and fall on 7/20. Pt also with recent hx of numbness/tinglinig in B hands. Imaging shows progressive degenerative changes, stenosis at C4-5 and C5-6 levels, and central cord syndrome. Neurosurgeon recommends operative mgmt of ACDF, pending pt decision. PMH: DM, polyneuropathy, HTN,, HLD, chronic neck/back pain    PT Comments    Patient progressing towards physical therapy goals. Patient continues to demo mild balance deficits especially during turns and obstacle negotiation. Patient requires min guard for safety with RW. Patient with complaints of bilateral hand pain throughout session. Recommend OPPT following discharge at this time pending progress and upcoming surgery on 7/26.     Follow Up Recommendations  Outpatient PT;Supervision for mobility/OOB (recommendation may change following surgery on 7/26)     Equipment Recommendations  Rolling Azadeh Hyder with 5" wheels    Recommendations for Other Services       Precautions / Restrictions Precautions Precautions: Fall Restrictions Weight Bearing Restrictions: No    Mobility  Bed Mobility Overal bed mobility: Independent                  Transfers Overall transfer level: Needs assistance Equipment used: Rolling Falana Clagg (2 wheeled) Transfers: Sit to/from Stand Sit to Stand: Supervision         General transfer comment: supervision for safety  Ambulation/Gait Ambulation/Gait assistance: Min guard Gait Distance (Feet): 250 Feet Assistive device: Rolling Angelys Yetman (2 wheeled) Gait Pattern/deviations: Step-through pattern;Staggering left Gait velocity: decreased   General Gait Details: min guard for safety. LOB x 1 initially with ambulation to the L but able to self correct. Limited WB through RW on L hand due to  pain   Stairs             Wheelchair Mobility    Modified Rankin (Stroke Patients Only)       Balance Overall balance assessment: Needs assistance Sitting-balance support: Feet supported;No upper extremity supported Sitting balance-Leahy Scale: Fair     Standing balance support: Bilateral upper extremity supported;During functional activity Standing balance-Leahy Scale: Poor Standing balance comment: reliant on UE support and external assist                            Cognition Arousal/Alertness: Awake/alert Behavior During Therapy: Flat affect Overall Cognitive Status: Within Functional Limits for tasks assessed                                        Exercises      General Comments        Pertinent Vitals/Pain Pain Assessment: Faces Faces Pain Scale: Hurts little more Pain Location: B hands Pain Descriptors / Indicators: Pins and needles Pain Intervention(s): Monitored during session;Repositioned    Home Living                      Prior Function            PT Goals (current goals can now be found in the care plan section) Acute Rehab PT Goals Patient Stated Goal: make my hands work better PT Goal Formulation: With patient Time For Goal Achievement: 12/22/20 Progress towards PT goals: Progressing toward goals    Frequency    Min 3X/week  PT Plan Current plan remains appropriate    Co-evaluation              AM-PAC PT "6 Clicks" Mobility   Outcome Measure  Help needed turning from your back to your side while in a flat bed without using bedrails?: None Help needed moving from lying on your back to sitting on the side of a flat bed without using bedrails?: None Help needed moving to and from a bed to a chair (including a wheelchair)?: A Little Help needed standing up from a chair using your arms (e.g., wheelchair or bedside chair)?: A Little Help needed to walk in hospital room?: A  Little Help needed climbing 3-5 steps with a railing? : A Lot 6 Click Score: 19    End of Session Equipment Utilized During Treatment: Gait belt Activity Tolerance: Patient tolerated treatment well Patient left: in bed;with call bell/phone within reach Nurse Communication: Mobility status PT Visit Diagnosis: Unsteadiness on feet (R26.81);Pain;History of falling (Z91.81)     Time: [id number] PT Time Calculation (min) (ACUTE ONLY): 14 min  Charges:  $Gait Training: 8-22 mins                     Zaylon Bossier A. Gilford Rile PT, DPT Acute Rehabilitation Services Pager [phone] Office [phone]    Linna Hoff 12/09/2020, 5:02 PM

## 2020-12-09 NOTE — Progress Notes (Signed)
Report given to the receiving RN at 5N.

## 2020-12-10 LAB — GLUCOSE, CAPILLARY
Glucose-Capillary: 83 mg/dL (ref 70–99)
Glucose-Capillary: 94 mg/dL (ref 70–99)
Glucose-Capillary: 91 mg/dL (ref 70–99)
Glucose-Capillary: 149 mg/dL — ABNORMAL HIGH (ref 70–99)

## 2020-12-10 MED ORDER — BISACODYL 5 MG PO TBEC
10.0000 mg | DELAYED_RELEASE_TABLET | Freq: Once | ORAL | Status: AC
  Administered 2020-12-10: 10 mg via ORAL
  Filled 2020-12-10: qty 2

## 2020-12-10 NOTE — Plan of Care (Signed)

## 2020-12-10 NOTE — Plan of Care (Signed)
  Problem: Activity: Goal: Risk for activity intolerance will decrease Outcome: Progressing   Problem: Coping: Goal: Level of anxiety will decrease Outcome: Progressing   Problem: Pain Managment: Goal: General experience of comfort will improve Outcome: Progressing   Problem: Safety: Goal: Ability to remain free from injury will improve Outcome: Progressing   Problem: Skin Integrity: Goal: Risk for impaired skin integrity will decrease Outcome: Progressing   

## 2020-12-10 NOTE — Plan of Care (Signed)
  Problem: Activity: Goal: Risk for activity intolerance will decrease Outcome: Progressing   Problem: Pain Managment: Goal: General experience of comfort will improve Outcome: Progressing   Problem: Safety: Goal: Ability to remain free from injury will improve Outcome: Progressing   

## 2020-12-11 LAB — SURGICAL PCR SCREEN
MRSA, PCR: NEGATIVE
Staphylococcus aureus: NEGATIVE

## 2020-12-11 LAB — GLUCOSE, CAPILLARY
Glucose-Capillary: 91 mg/dL (ref 70–99)
Glucose-Capillary: 82 mg/dL (ref 70–99)
Glucose-Capillary: 95 mg/dL (ref 70–99)
Glucose-Capillary: 116 mg/dL — ABNORMAL HIGH (ref 70–99)

## 2020-12-11 MED ORDER — VANCOMYCIN HCL IN DEXTROSE 1-5 GM/200ML-% IV SOLN
1000.0000 mg | INTRAVENOUS | Status: AC
  Administered 2020-12-12: 1000 mg via INTRAVENOUS
  Filled 2020-12-11: qty 200

## 2020-12-11 MED ORDER — WHITE PETROLATUM EX OINT
TOPICAL_OINTMENT | CUTANEOUS | Status: AC
  Filled 2020-12-11: qty 28.35

## 2020-12-11 NOTE — Progress Notes (Signed)
   Providing Compassionate, Quality Care - Together  NEUROSURGERY PROGRESS NOTE   S: No issues overnight.   O: EXAM:  BP 129/84 (BP Location: Right Arm)   Pulse 74   Temp 98.5 F (36.9 C) (Oral)   Resp 17   Ht '5\' 2"'$  (1.575 m)   Wt 72.6 kg   SpO2 100%   BMI 29.26 kg/m   Awake, alert, oriented x3 Speech fluent, appropriate  CNs grossly intact  PERRL BL dysesthesia in BUE 4+/5 BUE 5/5 BLE   ASSESSMENT:  60 y.o. female with   CSM, C4-6 Central cord syndrome  PLAN: - planned OR tomorrow C4-6 ACDF -all risks, benefits d/w patient, all questions answered    Thank you for allowing me to participate in this patient's care.  Please do not hesitate to call with questions or concerns.   Elwin Sleight, [facility] Cell: [phone]

## 2020-12-11 NOTE — Care Management Important Message (Signed)
Important Message  Patient Details  Name: Kristen [name] MRN: [id number] Date of Birth: 30-Oct-1960   Medicare Important Message Given:  Yes     Orbie Pyo 12/11/2020, 4:38 PM

## 2020-12-11 NOTE — Progress Notes (Signed)
PT Cancellation Note  Patient Details Name: Kristen [name] MRN: [id number] DOB: 1960/08/27   Cancelled Treatment:    Reason Eval/Treat Not Completed: (P) Patient declined, no reason specified (Pt reports she has been ambulatory in room unassisted today, will f/u with re-eval post cervical surgery.)   Cristela Blue 12/11/2020, 4:30 PM  Erasmo Leventhal , PTA [place] Pager [phone] Office [phone]

## 2020-12-11 NOTE — Progress Notes (Signed)
Occupational Therapy Treatment Patient Details Name: Kristen [name] MRN: [id number] DOB: Apr 18, 1961 Today's Date: 12/11/2020    History of present illness 60 y.o. female presenting after syncopal episode and fall on 7/20. Pt also with recent hx of numbness/tinglinig in B hands. Imaging shows progressive degenerative changes, stenosis at C4-5 and C5-6 levels, and central cord syndrome. Neurosurgeon recommends operative mgmt of ACDF, pending pt decision. PMH: DM, polyneuropathy, HTN,, HLD, chronic neck/back pain   OT comments  Pt making progress with functional goals. Pt in bathroom after showering upon arrival. Pt not using RW and required verbal cue to use for safety. Session focused on grooming/hygiene at sink UB and LB dressing, recall of fall prevention strategies. OT will continue to follow acutely to maximize level of function and safety  Follow Up Recommendations  Other (comment) (TBD after surgery 12/12/2020)    Equipment Recommendations  Other (comment) (RW?, TBD after surgery)    Recommendations for Other Services      Precautions / Restrictions Precautions Precautions: Fall Restrictions Weight Bearing Restrictions: No       Mobility Bed Mobility               General bed mobility comments: pt coming out of bathroom upon arrival, not using RW    Transfers Overall transfer level: Needs assistance Equipment used: Rolling walker (2 wheeled);1 person hand held assist Transfers: Sit to/from Stand Sit to Stand: Supervision         General transfer comment: supervision for safety    Balance Overall balance assessment: Needs assistance Sitting-balance support: Feet supported;No upper extremity supported Sitting balance-Leahy Scale: Good     Standing balance support: Bilateral upper extremity supported;During functional activity Standing balance-Leahy Scale: Fair                             ADL either performed or assessed with clinical  judgement   ADL Overall ADL's : Needs assistance/impaired     Grooming: Wash/dry hands;Wash/dry face;Oral care;Supervision/safety;Standing   Upper Body Bathing: Set up       Upper Body Dressing : Set up;Supervision/safety;Standing   Lower Body Dressing: Min guard;Sit to/from stand   Toilet Transfer: Min guard;Supervision/safety;Ambulation   Toileting- Clothing Manipulation and Hygiene: Supervision/safety;Sit to/from stand       Functional mobility during ADLs: Min guard;Supervision/safety;Rolling walker;Cueing for safety General ADL Comments: Pt coming out of bathroom after showering, Pt not using RW although RW in bathroom     Vision Baseline Vision/History: Wears glasses Wears Glasses: Reading only Patient Visual Report: No change from baseline     Perception     Praxis      Cognition Arousal/Alertness: Awake/alert Behavior During Therapy: WFL for tasks assessed/performed Overall Cognitive Status: Within Functional Limits for tasks assessed                                          Exercises     Shoulder Instructions       General Comments      Pertinent Vitals/ Pain       Pain Assessment: Faces Faces Pain Scale: Hurts a little bit Pain Location: B hands Pain Descriptors / Indicators: Pins and needles;Tingling;Numbness Pain Intervention(s): Monitored during session;Repositioned  Home Living  Prior Functioning/Environment              Frequency  Min 2X/week        Progress Toward Goals  OT Goals(current goals can now be found in the care plan section)  Progress towards OT goals: Progressing toward goals  Acute Rehab OT Goals Patient Stated Goal: make my hands work better  Plan Discharge plan remains appropriate;Frequency remains appropriate    Co-evaluation                 AM-PAC OT "6 Clicks" Daily Activity     Outcome Measure   Help from another  person eating meals?: None Help from another person taking care of personal grooming?: A Little Help from another person toileting, which includes using toliet, bedpan, or urinal?: A Little Help from another person bathing (including washing, rinsing, drying)?: A Little Help from another person to put on and taking off regular upper body clothing?: A Little Help from another person to put on and taking off regular lower body clothing?: A Little 6 Click Score: 19    End of Session Equipment Utilized During Treatment: Rolling walker  OT Visit Diagnosis: Unsteadiness on feet (R26.81);Other abnormalities of gait and mobility (R26.89);Muscle weakness (generalized) (M62.81)   Activity Tolerance Patient tolerated treatment well   Patient Left in bed;with call bell/phone within reach (sitting EOB)   Nurse Communication          [id number] OT Time Calculation (min): 18 min  Charges: OT General Charges $OT Visit: 1 Visit OT Treatments $Self Care/Home Management : 8-22 mins     Britt Bottom 12/11/2020, 1:55 PM

## 2020-12-11 NOTE — Care Management Important Message (Signed)
Important Message  Patient Details  Name: Kristen [name] MRN: [id number] Date of Birth: 1961/02/22   Medicare Important Message Given:  Yes     Orbie Pyo 12/11/2020, 4:31 PM

## 2020-12-12 ENCOUNTER — Encounter (HOSPITAL_COMMUNITY)
Admission: EM | Disposition: A | Discharge status: Home / Self Care | Payer: Self-pay | Source: Home / Self Care | Attending: Neurological Surgery

## 2020-12-12 ENCOUNTER — Inpatient Hospital Stay (HOSPITAL_COMMUNITY): Payer: Medicare Other | Admitting: Certified Registered Nurse Anesthetist

## 2020-12-12 ENCOUNTER — Inpatient Hospital Stay (HOSPITAL_COMMUNITY): Payer: Medicare Other

## 2020-12-12 ENCOUNTER — Encounter (HOSPITAL_COMMUNITY): Payer: Self-pay | Admitting: Internal Medicine

## 2020-12-12 HISTORY — PX: ANTERIOR CERVICAL DECOMP/DISCECTOMY FUSION: SHX1161

## 2020-12-12 LAB — TYPE AND SCREEN
ABO/RH(D): A POS
Antibody Screen: NEGATIVE

## 2020-12-12 LAB — ABO/RH: ABO/RH(D): A POS

## 2020-12-12 LAB — GLUCOSE, CAPILLARY
Glucose-Capillary: 88 mg/dL (ref 70–99)
Glucose-Capillary: 85 mg/dL (ref 70–99)
Glucose-Capillary: 116 mg/dL — ABNORMAL HIGH (ref 70–99)
Glucose-Capillary: 157 mg/dL — ABNORMAL HIGH (ref 70–99)

## 2020-12-12 SURGERY — ANTERIOR CERVICAL DECOMPRESSION/DISCECTOMY FUSION 2 LEVELS
Anesthesia: General | Site: Neck

## 2020-12-12 MED ORDER — VASOPRESSIN 20 UNIT/ML IV SOLN
INTRAVENOUS | Status: DC | PRN
  Administered 2020-12-12: 1 [IU] via INTRAVENOUS

## 2020-12-12 MED ORDER — LIDOCAINE 2% (20 MG/ML) 5 ML SYRINGE
INTRAMUSCULAR | Status: DC | PRN
  Administered 2020-12-12: 80 mg via INTRAVENOUS

## 2020-12-12 MED ORDER — FENTANYL CITRATE (PF) 250 MCG/5ML IJ SOLN
INTRAMUSCULAR | Status: DC | PRN
  Administered 2020-12-12: 25 ug via INTRAVENOUS
  Administered 2020-12-12: 50 ug via INTRAVENOUS
  Administered 2020-12-12: 100 ug via INTRAVENOUS
  Administered 2020-12-12: 50 ug via INTRAVENOUS

## 2020-12-12 MED ORDER — LIDOCAINE-EPINEPHRINE 1 %-1:100000 IJ SOLN
INTRAMUSCULAR | Status: AC
  Filled 2020-12-12: qty 1

## 2020-12-12 MED ORDER — EPHEDRINE SULFATE-NACL 50-0.9 MG/10ML-% IV SOSY
PREFILLED_SYRINGE | INTRAVENOUS | Status: DC | PRN
  Administered 2020-12-12: 10 mg via INTRAVENOUS

## 2020-12-12 MED ORDER — MIDAZOLAM HCL 2 MG/2ML IJ SOLN
INTRAMUSCULAR | Status: AC
  Filled 2020-12-12: qty 2

## 2020-12-12 MED ORDER — ALBUMIN HUMAN 5 % IV SOLN: INTRAVENOUS | Status: DC | PRN

## 2020-12-12 MED ORDER — PROPOFOL 10 MG/ML IV BOLUS
INTRAVENOUS | Status: DC | PRN
  Administered 2020-12-12: 150 mg via INTRAVENOUS

## 2020-12-12 MED ORDER — AMISULPRIDE (ANTIEMETIC) 5 MG/2ML IV SOLN: 10.0000 mg | Freq: Once | INTRAVENOUS | Status: DC | PRN

## 2020-12-12 MED ORDER — HYDROCODONE-ACETAMINOPHEN 10-325 MG PO TABS
1.0000 | ORAL_TABLET | ORAL | Status: DC | PRN
  Administered 2020-12-12: 1 via ORAL
  Filled 2020-12-12: qty 1

## 2020-12-12 MED ORDER — OXYCODONE HCL 5 MG PO TABS
ORAL_TABLET | ORAL | Status: AC
  Filled 2020-12-12: qty 1

## 2020-12-12 MED ORDER — GABAPENTIN 300 MG PO CAPS
600.0000 mg | ORAL_CAPSULE | Freq: Three times a day (TID) | ORAL | Status: DC
  Administered 2020-12-12 – 2020-12-13 (×2): 600 mg via ORAL
  Filled 2020-12-12 (×2): qty 2

## 2020-12-12 MED ORDER — METHOCARBAMOL 1000 MG/10ML IJ SOLN
500.0000 mg | Freq: Four times a day (QID) | INTRAVENOUS | Status: DC | PRN
  Filled 2020-12-12: qty 5

## 2020-12-12 MED ORDER — METHOCARBAMOL 500 MG PO TABS
ORAL_TABLET | ORAL | Status: AC
  Filled 2020-12-12: qty 1

## 2020-12-12 MED ORDER — BUPIVACAINE-EPINEPHRINE 0.5% -1:200000 IJ SOLN
INTRAMUSCULAR | Status: AC
  Filled 2020-12-12: qty 1

## 2020-12-12 MED ORDER — THROMBIN 5000 UNITS EX SOLR: CUTANEOUS | Status: DC | PRN

## 2020-12-12 MED ORDER — FENTANYL CITRATE (PF) 250 MCG/5ML IJ SOLN
INTRAMUSCULAR | Status: AC
  Filled 2020-12-12: qty 5

## 2020-12-12 MED ORDER — PROPOFOL 500 MG/50ML IV EMUL
INTRAVENOUS | Status: DC | PRN
Start: 2020-12-12 — End: 2020-12-12
  Administered 2020-12-12: 30 ug/kg/min via INTRAVENOUS

## 2020-12-12 MED ORDER — HEMOSTATIC AGENTS (NO CHARGE) OPTIME
TOPICAL | Status: DC | PRN
Start: 2020-12-12 — End: 2020-12-12
  Administered 2020-12-12: 1 via TOPICAL

## 2020-12-12 MED ORDER — TRIAMCINOLONE ACETONIDE 40 MG/ML IJ SUSP
INTRAMUSCULAR | Status: DC | PRN
  Administered 2020-12-12: 40 mg

## 2020-12-12 MED ORDER — SENNOSIDES-DOCUSATE SODIUM 8.6-50 MG PO TABS: 1.0000 | ORAL_TABLET | Freq: Every evening | ORAL | Status: DC | PRN

## 2020-12-12 MED ORDER — KETOROLAC TROMETHAMINE 15 MG/ML IJ SOLN
7.5000 mg | Freq: Four times a day (QID) | INTRAMUSCULAR | Status: DC
  Administered 2020-12-12 – 2020-12-13 (×3): 7.5 mg via INTRAVENOUS
  Filled 2020-12-12 (×2): qty 1

## 2020-12-12 MED ORDER — SODIUM CHLORIDE 0.9 % IV SOLN: 250.0000 mL | INTRAVENOUS | Status: DC

## 2020-12-12 MED ORDER — LACTATED RINGERS IV SOLN: INTRAVENOUS | Status: DC

## 2020-12-12 MED ORDER — PROMETHAZINE HCL 25 MG/ML IJ SOLN: 6.2500 mg | INTRAMUSCULAR | Status: DC | PRN

## 2020-12-12 MED ORDER — THROMBIN 5000 UNITS EX SOLR: OROMUCOSAL | Status: DC | PRN

## 2020-12-12 MED ORDER — CHLORHEXIDINE GLUCONATE CLOTH 2 % EX PADS
6.0000 | MEDICATED_PAD | Freq: Once | CUTANEOUS | Status: AC
  Administered 2020-12-12: 6 via TOPICAL

## 2020-12-12 MED ORDER — OXYCODONE HCL 5 MG PO TABS: 10.0000 mg | ORAL_TABLET | ORAL | Status: DC | PRN

## 2020-12-12 MED ORDER — DEXAMETHASONE SODIUM PHOSPHATE 10 MG/ML IJ SOLN
INTRAMUSCULAR | Status: DC | PRN
  Administered 2020-12-12: 10 mg via INTRAVENOUS

## 2020-12-12 MED ORDER — PHENYLEPHRINE 40 MCG/ML (10ML) SYRINGE FOR IV PUSH (FOR BLOOD PRESSURE SUPPORT)
PREFILLED_SYRINGE | INTRAVENOUS | Status: DC | PRN
  Administered 2020-12-12: 160 ug via INTRAVENOUS
  Administered 2020-12-12: 400 ug via INTRAVENOUS
  Administered 2020-12-12: 120 ug via INTRAVENOUS

## 2020-12-12 MED ORDER — MORPHINE SULFATE (PF) 2 MG/ML IV SOLN: 2.0000 mg | INTRAVENOUS | Status: DC | PRN

## 2020-12-12 MED ORDER — ONDANSETRON HCL 4 MG/2ML IJ SOLN
INTRAMUSCULAR | Status: DC | PRN
  Administered 2020-12-12: 4 mg via INTRAVENOUS

## 2020-12-12 MED ORDER — MIDAZOLAM HCL 2 MG/2ML IJ SOLN
INTRAMUSCULAR | Status: DC | PRN
  Administered 2020-12-12: 2 mg via INTRAVENOUS

## 2020-12-12 MED ORDER — KETOROLAC TROMETHAMINE 15 MG/ML IJ SOLN
INTRAMUSCULAR | Status: AC
  Filled 2020-12-12: qty 1

## 2020-12-12 MED ORDER — SODIUM CHLORIDE 0.9 % IV SOLN: INTRAVENOUS | Status: DC

## 2020-12-12 MED ORDER — VANCOMYCIN HCL IN DEXTROSE 1-5 GM/200ML-% IV SOLN: 1000.0000 mg | INTRAVENOUS | Status: AC

## 2020-12-12 MED ORDER — PROPOFOL 10 MG/ML IV BOLUS
INTRAVENOUS | Status: AC
  Filled 2020-12-12: qty 20

## 2020-12-12 MED ORDER — ROCURONIUM BROMIDE 10 MG/ML (PF) SYRINGE
PREFILLED_SYRINGE | INTRAVENOUS | Status: DC | PRN
  Administered 2020-12-12: 20 mg via INTRAVENOUS
  Administered 2020-12-12: 80 mg via INTRAVENOUS

## 2020-12-12 MED ORDER — CHLORHEXIDINE GLUCONATE 0.12 % MT SOLN
OROMUCOSAL | Status: AC
  Administered 2020-12-12: 15 mL via OROMUCOSAL
  Filled 2020-12-12: qty 15

## 2020-12-12 MED ORDER — HYDROMORPHONE HCL 1 MG/ML IJ SOLN
0.2500 mg | INTRAMUSCULAR | Status: DC | PRN
  Administered 2020-12-12 (×2): 0.5 mg via INTRAVENOUS

## 2020-12-12 MED ORDER — PROPOFOL 10 MG/ML IV BOLUS: INTRAVENOUS | Status: DC | PRN

## 2020-12-12 MED ORDER — VANCOMYCIN HCL IN DEXTROSE 1-5 GM/200ML-% IV SOLN
1000.0000 mg | Freq: Once | INTRAVENOUS | Status: AC
  Administered 2020-12-13: 1000 mg via INTRAVENOUS
  Filled 2020-12-12: qty 200

## 2020-12-12 MED ORDER — OXYCODONE HCL 5 MG PO TABS
5.0000 mg | ORAL_TABLET | Freq: Once | ORAL | Status: AC | PRN
Start: 2020-12-12 — End: 2020-12-12
  Administered 2020-12-12: 5 mg via ORAL

## 2020-12-12 MED ORDER — MENTHOL 3 MG MT LOZG: 1.0000 | LOZENGE | OROMUCOSAL | Status: DC | PRN

## 2020-12-12 MED ORDER — ORAL CARE MOUTH RINSE: 15.0000 mL | Freq: Once | OROMUCOSAL | Status: AC

## 2020-12-12 MED ORDER — PHENOL 1.4 % MT LIQD: 1.0000 | OROMUCOSAL | Status: DC | PRN

## 2020-12-12 MED ORDER — THROMBIN 5000 UNITS EX SOLR
CUTANEOUS | Status: AC
  Filled 2020-12-12: qty 5000

## 2020-12-12 MED ORDER — CHLORHEXIDINE GLUCONATE 0.12 % MT SOLN: 15.0000 mL | Freq: Once | OROMUCOSAL | Status: AC

## 2020-12-12 MED ORDER — SUGAMMADEX SODIUM 200 MG/2ML IV SOLN
INTRAVENOUS | Status: DC | PRN
  Administered 2020-12-12: 200 mg via INTRAVENOUS

## 2020-12-12 MED ORDER — HYDROMORPHONE HCL 1 MG/ML IJ SOLN
INTRAMUSCULAR | Status: AC
  Filled 2020-12-12: qty 1

## 2020-12-12 MED ORDER — TRIAMCINOLONE ACETONIDE 40 MG/ML IJ SUSP
40.0000 mg | Freq: Once | INTRAMUSCULAR | Status: DC
  Filled 2020-12-12 (×2): qty 1

## 2020-12-12 MED ORDER — SODIUM CHLORIDE 0.9% FLUSH: 3.0000 mL | INTRAVENOUS | Status: DC | PRN

## 2020-12-12 MED ORDER — PHENYLEPHRINE HCL-NACL 10-0.9 MG/250ML-% IV SOLN
INTRAVENOUS | Status: DC | PRN
  Administered 2020-12-12: 75 ug/min via INTRAVENOUS

## 2020-12-12 MED ORDER — 0.9 % SODIUM CHLORIDE (POUR BTL) OPTIME
TOPICAL | Status: DC | PRN
  Administered 2020-12-12: 1000 mL

## 2020-12-12 MED ORDER — METHOCARBAMOL 500 MG PO TABS
500.0000 mg | ORAL_TABLET | Freq: Four times a day (QID) | ORAL | Status: DC | PRN
  Administered 2020-12-12: 500 mg via ORAL

## 2020-12-12 MED ORDER — OXYCODONE HCL 5 MG/5ML PO SOLN: 5.0000 mg | Freq: Once | ORAL | Status: AC | PRN

## 2020-12-12 MED ORDER — SODIUM CHLORIDE 0.9% FLUSH
3.0000 mL | Freq: Two times a day (BID) | INTRAVENOUS | Status: DC
  Administered 2020-12-12 – 2020-12-13 (×2): 3 mL via INTRAVENOUS

## 2020-12-12 SURGICAL SUPPLY — 65 items
BAG COUNTER SPONGE SURGICOUNT (BAG) ×4 IMPLANT
BAND RUBBER #18 3X1/16 STRL (MISCELLANEOUS) ×4 IMPLANT
BENZOIN TINCTURE PRP APPL 2/3 (GAUZE/BANDAGES/DRESSINGS) ×2 IMPLANT
BIT DRILL NEURO 2X3.1 SFT TUCH (MISCELLANEOUS) ×1 IMPLANT
BLADE CLIPPER SURG (BLADE) IMPLANT
BUR CARBIDE MATCH 3.0 (BURR) ×2 IMPLANT
CAGE LORDOTIC 6 SM (Cage) ×4 IMPLANT
CANISTER SUCT 3000ML PPV (MISCELLANEOUS) ×2 IMPLANT
CARTRIDGE OIL MAESTRO DRILL (MISCELLANEOUS) ×1 IMPLANT
COVER MAYO STAND STRL (DRAPES) ×2 IMPLANT
DIFFUSER DRILL AIR PNEUMATIC (MISCELLANEOUS) IMPLANT
DRAPE C-ARM 42X72 X-RAY (DRAPES) ×2 IMPLANT
DRAPE HALF SHEET 40X57 (DRAPES) IMPLANT
DRAPE LAPAROTOMY 100X72X124 (DRAPES) ×2 IMPLANT
DRAPE MICROSCOPE LEICA (MISCELLANEOUS) ×2 IMPLANT
DRILL NEURO 2X3.1 SOFT TOUCH (MISCELLANEOUS) ×2
DRSG OPSITE POSTOP 4X6 (GAUZE/BANDAGES/DRESSINGS) ×2 IMPLANT
DURAPREP 6ML APPLICATOR 50/CS (WOUND CARE) ×2 IMPLANT
ELECT COATED BLADE 2.86 ST (ELECTRODE) ×2 IMPLANT
ELECT REM PT RETURN 9FT ADLT (ELECTROSURGICAL) ×2
ELECTRODE REM PT RTRN 9FT ADLT (ELECTROSURGICAL) ×1 IMPLANT
EVACUATOR 1/8 PVC DRAIN (DRAIN) IMPLANT
FEE INTRAOP CADWELL SUPPLY NCS (MISCELLANEOUS) ×1 IMPLANT
FEE INTRAOP MONITOR IMPULS NCS (MISCELLANEOUS) ×1 IMPLANT
GAUZE 4X4 16PLY ~~LOC~~+RFID DBL (SPONGE) ×2 IMPLANT
GLOVE EXAM NITRILE LRG STRL (GLOVE) IMPLANT
GLOVE EXAM NITRILE XL STR (GLOVE) IMPLANT
GLOVE EXAM NITRILE XS STR PU (GLOVE) IMPLANT
GLOVE SRG 8 PF TXTR STRL LF DI (GLOVE) ×1 IMPLANT
GLOVE SURG LTX SZ8 (GLOVE) ×4 IMPLANT
GLOVE SURG POLYISO LF SZ7.5 (GLOVE) ×2 IMPLANT
GLOVE SURG UNDER POLY LF SZ7.5 (GLOVE) ×2 IMPLANT
GLOVE SURG UNDER POLY LF SZ8 (GLOVE) ×2
GOWN STRL REUS W/ TWL LRG LVL3 (GOWN DISPOSABLE) ×1 IMPLANT
GOWN STRL REUS W/ TWL XL LVL3 (GOWN DISPOSABLE) ×2 IMPLANT
GOWN STRL REUS W/TWL 2XL LVL3 (GOWN DISPOSABLE) IMPLANT
GOWN STRL REUS W/TWL LRG LVL3 (GOWN DISPOSABLE) ×2
GOWN STRL REUS W/TWL XL LVL3 (GOWN DISPOSABLE) ×4
HEMOSTAT POWDER KIT SURGIFOAM (HEMOSTASIS) ×2 IMPLANT
INTRAOP CADWELL SUPPLY FEE NCS (MISCELLANEOUS) ×1
INTRAOP DISP SUPPLY FEE NCS (MISCELLANEOUS) ×2
INTRAOP MONITOR FEE IMPULS NCS (MISCELLANEOUS) ×1
INTRAOP MONITOR FEE IMPULSE (MISCELLANEOUS) ×2
KIT BASIN OR (CUSTOM PROCEDURE TRAY) ×2 IMPLANT
KIT TURNOVER KIT B (KITS) ×2 IMPLANT
NEEDLE HYPO 22GX1.5 SAFETY (NEEDLE) ×2 IMPLANT
NEEDLE SPNL 18GX3.5 QUINCKE PK (NEEDLE) ×2 IMPLANT
NS IRRIG 1000ML POUR BTL (IV SOLUTION) ×2 IMPLANT
OIL CARTRIDGE MAESTRO DRILL (MISCELLANEOUS) ×2
PACK LAMINECTOMY NEURO (CUSTOM PROCEDURE TRAY) ×2 IMPLANT
PAD ARMBOARD 7.5X6 YLW CONV (MISCELLANEOUS) ×6 IMPLANT
PIN DISTRACTION 14MM (PIN) ×4 IMPLANT
PLATE ZEVO 2LVL 31MM (Plate) ×2 IMPLANT
PUTTY DBF 1CC CORTICAL FIBERS (Putty) ×4 IMPLANT
SCREW 13MM (Screw) ×4 IMPLANT
SCREW 3.5 SELFDRILL 15MM VARI (Screw) ×8 IMPLANT
SPONGE INTESTINAL PEANUT (DISPOSABLE) ×2 IMPLANT
SPONGE SURGIFOAM ABS GEL SZ50 (HEMOSTASIS) ×2 IMPLANT
STAPLER VISISTAT 35W (STAPLE) ×2 IMPLANT
STRIP CLOSURE SKIN 1/2X4 (GAUZE/BANDAGES/DRESSINGS) ×2 IMPLANT
TAPE SURG TRANSPORE 1 IN (GAUZE/BANDAGES/DRESSINGS) ×1 IMPLANT
TAPE SURGICAL TRANSPORE 1 IN (GAUZE/BANDAGES/DRESSINGS) ×2
TOWEL GREEN STERILE (TOWEL DISPOSABLE) ×2 IMPLANT
TOWEL GREEN STERILE FF (TOWEL DISPOSABLE) ×2 IMPLANT
WATER STERILE IRR 1000ML POUR (IV SOLUTION) ×2 IMPLANT

## 2020-12-12 NOTE — Anesthesia Preprocedure Evaluation (Signed)
Anesthesia Evaluation  Patient identified by MRN, date of birth, ID band Patient awake    Reviewed: Allergy & Precautions, NPO status , Patient's Chart, lab work & pertinent test results  Airway Mallampati: II  TM Distance: >3 FB Neck ROM: Full    Dental  (+) Edentulous Upper, Poor Dentition Bottom teeth with decay but none loose per patient:   Pulmonary asthma , sleep apnea , Patient abstained from smoking., former smoker,    Pulmonary exam normal breath sounds clear to auscultation       Cardiovascular hypertension, negative cardio ROS Normal cardiovascular exam Rhythm:Regular Rate:Normal     Neuro/Psych  Headaches, PSYCHIATRIC DISORDERS Anxiety Depression  Neuromuscular disease (neuropathy of bilateral UE)    GI/Hepatic negative GI ROS, (+)     substance abuse  marijuana use,   Endo/Other  prediabetes  Renal/GU negative Renal ROS  negative genitourinary   Musculoskeletal  (+) Arthritis , Osteoarthritis,    Abdominal   Peds negative pediatric ROS (+)  Hematology negative hematology ROS (+)   Anesthesia Other Findings   Reproductive/Obstetrics negative OB ROS                             Anesthesia Physical Anesthesia Plan  ASA: 2  Anesthesia Plan: General   Post-op Pain Management:    Induction: Intravenous  PONV Risk Score and Plan: 3 and Propofol infusion, Treatment may vary due to age or medical condition, TIVA, Midazolam, Ondansetron and Dexamethasone  Airway Management Planned: Oral ETT and Video Laryngoscope Planned  Additional Equipment: None  Intra-op Plan:   Post-operative Plan: Extubation in OR  Informed Consent: I have reviewed the patients History and Physical, chart, labs and discussed the procedure including the risks, benefits and alternatives for the proposed anesthesia with the patient or authorized representative who has indicated his/her understanding and  acceptance.     Dental advisory given  Plan Discussed with: CRNA  Anesthesia Plan Comments: (GETA. Glidescope. Good neck extension. Numbness/tingling of BLE is constant and not exacerabated by movement. Norton Blizzard, MD  )        Anesthesia Quick Evaluation

## 2020-12-12 NOTE — Progress Notes (Signed)
Pharmacy Antibiotic Note  Kristen [name] is a 60 y.o. female admitted on 12/06/2020 with cervical spondylosis, stenosis with myelopathy and cord signal change and central cord syndrome, C4-5, C5-6; pt is S/P surgery this afternoon. Pharmacy has been consulted for vancomycin dosing for post-op surgical prophylaxis.   WBC 7.4, afebrile; Scr 0.91, CrCl 62.1 ml/min  Pt rec'd vancomycin 1 gm IV X 1 pre op at 1227 this afternoon. Per surgeon note, pt has no drains in place post op.  Plan: Vancomycin 1 gm IV X 1 ~12 hrs after pre-op dose (dose is scheduled for 0030 on 7/27)  Height: '5\' 2"'$  (157.5 cm) Weight: 72.6 kg (160 lb) IBW/kg (Calculated) : 50.1  Temp (24hrs), Avg:98 F (36.7 C), Min:97.5 F (36.4 C), Max:98.2 F (36.8 C)  Recent Labs  Lab 12/07/20 0205 12/08/20 0343  WBC 10.8* 7.4  CREATININE 1.03* 0.91    Estimated Creatinine Clearance: 62.1 mL/min (by C-G formula based on SCr of 0.91 mg/dL).    Allergies  Allergen Reactions   Amoxicillin Hives   Penicillins Hives, Itching and Rash    Has patient had a PCN reaction causing immediate rash, facial/tongue/throat swelling, SOB or lightheadedness with hypotension: Yes Has patient had a PCN reaction causing severe rash involving mucus membranes or skin necrosis: No Has patient had a PCN reaction that required hospitalization: No Has patient had a PCN reaction occurring within the last 10 years: No If all of the above answers are "NO", then may proceed with Cephalosporin use.     Microbiology results: 7/22 COVID: negative 7/21 HIV screen: negative 7/25 MRSA PCR: negative  Thank you for allowing pharmacy to be a part of this patient's care.  Gillermina Hu, PharmD, BCPS, [facility] Clinical Pharmacist 12/12/2020 5:55 PM

## 2020-12-12 NOTE — Op Note (Signed)
Date of service: 12/12/2020  PREOP DIAGNOSIS: Cervical spondylosis, stenosis with myelopathy and cord signal change and central cord syndrome, C4-5, C5-6  POSTOP DIAGNOSIS: Same  PROCEDURE: 1. Arthrodesis C4-5, C5-6, anterior interbody technique, Medtronic Titan 6 x 14 x 12 mm at C4-5, 6.14 x 12 mm at C5-6 interbody devices 2. Placement of intervertebral biomechanical device C4-5, C5-6 3. Placement of anterior instrumentation consisting of interbody plate and screws -[id number], C5-6, Medtronic zevo plate with 15 mm screws in C4, 13 mm screws in C5, 15 mm screws in C6 4. Discectomy at C4-5, C5-6 for decompression of spinal cord and exiting nerve roots  5. Use of morselized bone allograft  6. Use of intraoperative microscope 7.  Intraoperative use of neuro monitoring, greater than 1 hour, SSEPs  SURGEON: Dr. Elwin Sleight, DO  ASSISTANT: Dr. Consuella Lose, MD  ANESTHESIA: General Endotracheal  EBL: 50cc  SPECIMENS: None  DRAINS: None  COMPLICATIONS: None immediate  CONDITION: Hemodynamically stable to PACU  HISTORY: Kristen [name] is a 60 y.o. y.o. female who initially presented to the outpatient clinic with signs and symptoms consistent with myelopathy.  I had ordered an MRI but unfortunately she had not received it yet.  At home she had a syncopal event and hit her head and woke up with dysesthetic pain in her bilateral upper extremities and slight weakness consistent with central cord syndrome.  She was brought to the emergency department and had a syncopal work-up which was negative.  MRI and CT of the cervical spine were obtained which showed stenosis at C4-5 and C5-6 with cord signal change and spondylosis, severe at C5-6 with anterior listhesis of C4 on C5 and retrolisthesis of C5 on C6.  I discussed with the patient about urgent decompression and fusion versus conservative measures and she agreed with surgical intervention.  We discussed all risks, benefits and expected  outcomes.  Answered all of her questions.  PROCEDURE IN DETAIL: The patient was brought to the operating room and transferred to the operative table. After induction of general anesthesia, prepositional SSEPs were obtained and noted to be monitorable.  The patient was positioned on the operative table in the supine position with all pressure points meticulously padded.  The neck was gently placed in extension, post positional SSEPs were stable.  The skin of the neck was then prepped and draped in the usual sterile fashion.  After timeout was conducted skin incision was then made sharply with a 10 blade and Bovie electrocautery was used to dissect the subcutaneous tissue until the platysma was identified. The platysma was then divided and undermined. The sternocleidomastoid muscle was then identified as well as the omohyoid and, utilizing natural fascial planes in the neck, the prevertebral fascia was identified and the carotid sheath was retracted laterally and the trachea and esophagus retracted medially. Again using fluoroscopy, the correct disc space was identified, C5-6. Bovie electrocautery was used to dissect in the subperiosteal plane and elevate the bilateral longus coli muscles at C4, C5, C6 bilaterally. Self-retaining retractors were then placed under the longus coli muscles at C5-6. At this point, the microscope was draped and brought into the field, and the remainder of the case was done under the microscope using microdissecting technique.  The disc space was incised sharply and rongeurs were use to initially complete a discectomy.  Anterior osteophytes were removed with osteophyte tool.  Distraction pins were placed in midline at the C5 and C6 vertebral bodies.  The disc space was placed on distraction.  The high-speed drill was then used to complete discectomy until the posterior annulus was identified and removed and the posterior longitudinal ligament was identified. Using a microcurette's,  the PLL was elevated, and Kerrison rongeurs were used to remove the posterior longitudinal ligament and the ventral thecal sac was identified. Using a combination of curettes and ronguers, complete decompression of the thecal sac and exiting nerve roots at this level was completed, and verified using micro-nerve hook.  Epidural hemostasis was achieved with Surgi-Flo.  The disc space was taken out of distraction.  Having completed our decompression, attention was turned to placement of the intervertebral device. Trial spacers were used to select a 6 x 14 x 12 mm graft. This graft was then filled with morcellized allograft, and inserted under live fluoroscopy.  The distraction pin was taken out of the C6 vertebral body, achieved hemostasis with Surgiflo.  Distraction pin was then placed in the C4 vertebral body at midline.  Self-retaining retractors were then placed under the longus coli bilaterally at C4-5.  The disc space was incised sharply and rongeurs were use to initially complete a discectomy.  Anterior osteophytes were removed with osteophyte tool. The disc space was placed in distraction.  The high-speed drill was then used to complete discectomy until the posterior annulus was identified and removed and the posterior longitudinal ligament was identified. Using a microcurette's, the PLL was elevated, and Kerrison rongeurs were used to remove the posterior longitudinal ligament and the ventral thecal sac was identified. Using a combination of curettes and ronguers, complete decompression of the thecal sac and exiting nerve roots at this level was completed, and verified using micro-nerve hook.  Epidural hemostasis was achieved with Surgi-Flo.  The disc space was taken out of distraction.  Having completed our decompression, attention was turned to placement of the intervertebral device. Trial spacers were used to select a 6 x 14 x 12 mm graft. This graft was then filled with morcellized allograft, and  inserted under live fluoroscopy.  The distraction pin was taken out of the C5 and 4 vertebral body, achieved hemostasis with Surgiflo.   After placement of the intervertebral device, the above anterior cervical plate was selected, and placed across the interspaces. Using a high-speed drill, the cortex of the cervical vertebral bodies was punctured, and screws inserted in the C4, C5 and C6 vertebral bodies bilaterally. Final fluoroscopic images in AP and lateral projections were taken to confirm good hardware placement.  The plate was final tightened to the manufacturer's recommendation.  At this point, after all counts were verified to be correct, meticulous hemostasis was secured using a combination of bipolar electrocautery and passive hemostatics.  Skin was closed with staples.  Sterile dressing was applied.  Throughout the entirety of the procedure, the neuro monitoring SSEPs were noted to be stable.  The patient tolerated the procedure well and was extubated in the room and taken to the postanesthesia care unit in stable condition.

## 2020-12-12 NOTE — Transfer of Care (Signed)
Immediate Anesthesia Transfer of Care Note  Patient: Kristen [name]  Procedure(s) Performed: Anterior Cervical Decompression Fusion Cervical four-five, Cervical five-six (Neck)  Patient Location: PACU  Anesthesia Type:General  Level of Consciousness: awake and drowsy  Airway & Oxygen Therapy: Patient Spontanous Breathing  Post-op Assessment: Report given to RN and Post -op Vital signs reviewed and stable  Post vital signs: Reviewed and stable  Last Vitals:  Vitals Value Taken Time  BP 122/78 12/12/20 1610  Temp    Pulse 76 12/12/20 1612  Resp 20 12/12/20 1612  SpO2 100 % 12/12/20 1612  Vitals shown include unvalidated device data.  Last Pain:  Vitals:   12/12/20 1225  TempSrc: Oral  PainSc: 7       Patients Stated Pain Goal: 2 ([id number])  Complications: No notable events documented.

## 2020-12-12 NOTE — Anesthesia Procedure Notes (Addendum)
Procedure Name: Intubation Date/Time: 12/12/2020 1:31 PM Performed by: Dorthea Cove, CRNA Pre-anesthesia Checklist: Patient identified, Emergency Drugs available, Suction available and Patient being monitored Patient Re-evaluated:Patient Re-evaluated prior to induction Oxygen Delivery Method: Circle system utilized Preoxygenation: Pre-oxygenation with 100% oxygen Induction Type: IV induction Ventilation: Mask ventilation without difficulty Laryngoscope Size: Glidescope and 3 Grade View: Grade I Tube type: Oral Tube size: 7.0 mm Number of attempts: 1 Airway Equipment and Method: Stylet, Oral airway and Video-laryngoscopy Placement Confirmation: ETT inserted through vocal cords under direct vision, positive ETCO2 and breath sounds checked- equal and bilateral Secured at: 22 cm Tube secured with: Tape Dental Injury: Teeth and Oropharynx as per pre-operative assessment  Comments: Neck maintained in neutral alignment.

## 2020-12-12 NOTE — Progress Notes (Signed)
   Providing Compassionate, Quality Care - Together  NEUROSURGERY PROGRESS NOTE   S: pt s/e in pacu, no new complaints  O: EXAM:  BP 122/78   Pulse 72   Temp 97.8 F (36.6 C)   Resp 20   Ht '5\' 2"'$  (1.575 m)   Wt 72.6 kg   SpO2 100%   BMI 29.26 kg/m   Awake, alert, oriented  Speech fluent, appropriate  Neck soft Trachea midline Nml phonation CNs grossly intact  4+/5 BUE 5/5 BLE  Dressing c/d/i  ASSESSMENT:  59 y.o. 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  Please do not hesitate to call with questions or concerns.   Elwin Sleight, [facility] Cell: [phone]

## 2020-12-12 NOTE — Progress Notes (Signed)
Orthopedic Tech Progress Note Patient Details:  SUTTER [employer]LL[employer] [id number]  Dropped off ASPEN COLLAR to patient room  Patient ID: Kristen [name], female   DOB: 02-04-1961, 60 y.o.   MRN: [id number]  [name] 12/12/2020, 7:10 PM

## 2020-12-12 NOTE — Progress Notes (Signed)
   Providing Compassionate, Quality Care - Together  NEUROSURGERY PROGRESS NOTE   S: No issues overnight.   O: EXAM:  BP (!) 158/83   Pulse 72   Temp 98.2 F (36.8 C) (Oral)   Resp 18   Ht '5\' 2"'$  (1.575 m)   Wt 72.6 kg   SpO2 100%   BMI 29.26 kg/m     Awake, alert, oriented x3 Speech fluent, appropriate CNs grossly intact  PERRL BL dysesthesia in BUE 4+/5 BUE 5/5 BLE   ASSESSMENT:  60 y.o. female with   CSM, C4-6 Central cord syndrome   PLAN: - planned OR today C4-6 ACDF -all risks, benefits d/w patient, all questions answered.  Patient agrees to proceed     Thank you for allowing me to participate in this patient's care.  Please do not hesitate to call with questions or concerns.   Elwin Sleight, [facility] Cell: [phone]

## 2020-12-13 LAB — GLUCOSE, CAPILLARY: Glucose-Capillary: 130 mg/dL — ABNORMAL HIGH (ref 70–99)

## 2020-12-13 MED ORDER — GABAPENTIN 300 MG PO CAPS: 600.0000 mg | ORAL_CAPSULE | Freq: Three times a day (TID) | ORAL | 2 refills | Status: AC

## 2020-12-13 MED ORDER — METHOCARBAMOL 500 MG PO TABS: 500.0000 mg | ORAL_TABLET | Freq: Four times a day (QID) | ORAL | 1 refills | Status: AC | PRN

## 2020-12-13 MED ORDER — HYDROCODONE-ACETAMINOPHEN 10-325 MG PO TABS: 1.0000 | ORAL_TABLET | ORAL | 0 refills | Status: DC | PRN

## 2020-12-13 NOTE — Evaluation (Signed)
Occupational Therapy Evaluation Patient Details Name: Kristen [name] MRN: [id number] DOB: [birth year] Today's Date: 12/13/2020    History of Present Illness 60 y.o. female presenting after syncopal episode and fall on 7/20. Pt also with recent hx of numbness/tinglinig in B hands. Imaging shows progressive degenerative changes, stenosis at C4-5 and C5-6 levels, and central cord syndrome. S/p C4-6 ACDF. PMH: DM, polyneuropathy, HTN,, HLD, chronic neck/back pain   Clinical Impression   Pt PTA: Pt independent with ADL prior to fall. Pt currently, Pt ambulatory in room without an AD. Pt performing OOB ADL tasks with no physical assist other than for repositioning C-collar. Pt education for use of log roll for bed mobility and figure 4 technique for lower body ADL. Pt supervisionA for OOB ADL and mobility.  Back handout provided and reviewed ADL in detail. Pt educated on: set an alarm at night for medication, avoid sitting for long periods of time, correct bed positioning for sleeping, correct sequence for bed mobility, avoiding lifting more than 5 pounds and never wash directly over incision. All education is complete and patient indicates understanding. Pt does not require continued OT skilled services. OT signing off.    Follow Up Recommendations  No OT follow up    Equipment Recommendations  3 in 1 bedside commode    Recommendations for Other Services       Precautions / Restrictions Precautions Precautions: Fall;Cervical Precaution Booklet Issued: Yes (comment) Precaution Comments: education provided; handout provided. Pt able to recall 4/4 precautions Required Braces or Orthoses: Cervical Brace Cervical Brace: Hard collar (apply in sitting) Restrictions Weight Bearing Restrictions: No      Mobility Bed Mobility Overal bed mobility: Modified Independent             General bed mobility comments: Pt up in room pre and post session    Transfers Overall transfer level:  Modified independent Equipment used: None Transfers: Sit to/from Stand Sit to Stand: Supervision         General transfer comment: supervision for safety    Balance Overall balance assessment: No apparent balance deficits (not formally assessed) Sitting-balance support: Feet supported;No upper extremity supported Sitting balance-Leahy Scale: Good     Standing balance support: Bilateral upper extremity supported;During functional activity Standing balance-Leahy Scale: Fair Standing balance comment: reliant on UE support and external assist                           ADL either performed or assessed with clinical judgement   ADL Overall ADL's : Needs assistance/impaired                 Upper Body Dressing : Min guard;Standing Upper Body Dressing Details (indicate cue type and reason): light assist for neck brace; pt had already fully dressed prior to OTR arrival. Lower Body Dressing: Supervision/safety;Sitting/lateral leans;Sit to/from stand Lower Body Dressing Details (indicate cue type and reason): figure 4 technique Toilet Transfer: Supervision/safety;Ambulation       Tub/ Shower Transfer: Supervision/safety;Ambulation   Functional mobility during ADLs: Supervision/safety;Cueing for safety General ADL Comments: Pt ambulatory in room without an AD. Pt performing OOB ADL tasks with no physical assist other than for repositioning C-collar. Pt education for use of log roll for bed mobility and figure 4 technique for lower body ADL.     Vision Baseline Vision/History: Wears glasses Wears Glasses: Reading only Patient Visual Report: No change from baseline Vision Assessment?: No apparent visual deficits  Perception     Praxis      Pertinent Vitals/Pain Pain Assessment: 0-10 Pain Score: 3  Pain Location: surgical site Pain Descriptors / Indicators: Operative site guarding Pain Intervention(s): Monitored during session;Repositioned     Hand  Dominance Right   Extremity/Trunk Assessment Upper Extremity Assessment Upper Extremity Assessment: Overall WFL for tasks assessed RUE Deficits / Details: numbness in finger tips LUE Deficits / Details: numbness in finger tips   Lower Extremity Assessment Lower Extremity Assessment: Overall WFL for tasks assessed   Cervical / Trunk Assessment Cervical / Trunk Assessment: Other exceptions Cervical / Trunk Exceptions: s/p ACDF   Communication Communication Communication: No difficulties   Cognition Arousal/Alertness: Awake/alert Behavior During Therapy: WFL for tasks assessed/performed Overall Cognitive Status: Within Functional Limits for tasks assessed                                     General Comments  No reports of dizziness; pt with dry bandage at incision site.    Exercises     Shoulder Instructions      Home Living Family/patient expects to be discharged to:: Private residence Living Arrangements: Spouse/significant other Available Help at Discharge: Family Type of Home: House Home Access: Level entry     Home Layout: One level     Bathroom Shower/Tub: Teacher, early years/pre: Standard     Home Equipment: [name] - quad;Cane - single point   Additional Comments: lives with fiance who can provide 24/7 supervision. children live close. reports access to a shower chair if needed      Prior Functioning/Environment Level of Independence: Independent with assistive device(s)        Comments: cane since last back surgery, pt reports she has had x3 falls in the past year due to poor balance.        OT Problem List: Decreased strength;Impaired balance (sitting and/or standing);Decreased coordination;Impaired sensation;Decreased knowledge of use of DME or AE      OT Treatment/Interventions: Self-care/ADL training;Therapeutic exercise;DME and/or AE instruction;Therapeutic activities;Patient/family education;Balance training    OT  Goals(Current goals can be found in the care plan section) Acute Rehab OT Goals Patient Stated Goal: go home OT Goal Formulation: With patient Time For Goal Achievement: 12/22/20 Potential to Achieve Goals: Good  OT Frequency: Min 2X/week   Barriers to D/C:            Co-evaluation              AM-PAC OT "6 Clicks" Daily Activity     Outcome Measure Help from another person eating meals?: None Help from another person taking care of personal grooming?: A Little Help from another person toileting, which includes using toliet, bedpan, or urinal?: A Little Help from another person bathing (including washing, rinsing, drying)?: A Little Help from another person to put on and taking off regular upper body clothing?: A Little Help from another person to put on and taking off regular lower body clothing?: A Little 6 Click Score: 19   End of Session Equipment Utilized During Treatment: Cervical collar Nurse Communication: Mobility status;Precautions  Activity Tolerance: Patient tolerated treatment well Patient left: Other (comment) (up in room getting ready to discharge)  OT Visit Diagnosis: Unsteadiness on feet (R26.81);Other abnormalities of gait and mobility (R26.89);Muscle weakness (generalized) (M62.81)                Time: [id number] OT Time Calculation (  min): 25 min Charges:  OT General Charges $OT Visit: 1 Visit OT Evaluation $OT Re-eval: 1 Re-eval OT Treatments $Self Care/Home Management : 8-22 mins  Jefferey Pica, OTR/L Acute Rehabilitation Services Pager: [phone] Office: [phone]  Zaelyn Barbary C 12/13/2020, 11:50 AM

## 2020-12-13 NOTE — Anesthesia Postprocedure Evaluation (Signed)
Anesthesia Post Note  Patient: Kristen [name]  Procedure(s) Performed: Anterior Cervical Decompression Fusion Cervical four-five, Cervical five-six (Neck)     Patient location during evaluation: PACU Anesthesia Type: General Level of consciousness: awake Pain management: pain level controlled Vital Signs Assessment: post-procedure vital signs reviewed and stable Respiratory status: spontaneous breathing and respiratory function stable Cardiovascular status: stable Postop Assessment: no apparent nausea or vomiting Anesthetic complications: no   No notable events documented.  Last Vitals:  Vitals:   12/12/20 2301 12/13/20 0324  BP: 133/77 115/67  Pulse: 89 81  Resp: 12 12  Temp: 36.4 C 36.8 C  SpO2: 97% 95%    Last Pain:  Vitals:   12/13/20 0551  TempSrc:   PainSc: 3                  Candra R Burke Terry

## 2020-12-13 NOTE — Discharge Summary (Signed)
Physician Discharge Summary  Patient ID: Kristen [name] MRN: [id number] DOB/AGE: 20-Feb-1961 60 y.o.  Admit date: 12/06/2020 Discharge date: 12/13/2020  Admission Diagnoses:  Cervical stenosis with myelopathy, C4-6  Central cord syndrome  syncope  Discharge Diagnoses:  Same Active Problems:   Syncope   Discharged Condition: Stable  Hospital Course:  Kristen [name] is a 60 y.o. female  that presented to the hospital after syncopal event and hitting her head with central cord syndrome.  She was admitted to [facility] and syncopal workup was negative.  MRI and CT of the cervical spine revealed cervical stenosis with cord signal change C4-6 and she had dysesthetic pain and weakness in her bilateral upper extremities.  I had been seeing her in the office as an outpatient working her up for myelopathy, and she had not gotten her MRI yet.  I had a lengthy discussion with the patient offering surgical decompression versus medical management for central cord syndrome which we agreed with surgical intervention.  She underwent an ACDF C4-6 on [id number] without complication.  She tolerated the surgery well.  Postoperatively her dysesthetic pain improved in her strength improved.  Her pain was controlled on oral medications, she was having normal bowel bladder function upon discharge.  Treatments: Surgery -   ACDF C4-6 on 12/12/2020  Discharge Exam: Blood pressure 136/78, pulse 67, temperature 98.1 F (36.7 C), temperature source Oral, resp. rate 17, height '5\' 2"'$  (1.575 m), weight 72 kg, SpO2 98 %. Awake, alert, oriented x3  PERRLA Speech fluent, appropriate CN grossly intact 4+/5 BUE  5/5BLE Wound c/d/i  Disposition: Discharge disposition: 01-Home or Self Care       Discharge Instructions     Incentive spirometry RT   Complete by: As directed       Allergies as of 12/13/2020       Reactions   Amoxicillin Hives   Penicillins Hives, Itching, Rash   Has patient had  a PCN reaction causing immediate rash, facial/tongue/throat swelling, SOB or lightheadedness with hypotension: Yes Has patient had a PCN reaction causing severe rash involving mucus membranes or skin necrosis: No Has patient had a PCN reaction that required hospitalization: No Has patient had a PCN reaction occurring within the last 10 years: No If all of the above answers are "NO", then may proceed with Cephalosporin use.        Medication List     STOP taking these medications    HYDROcodone-acetaminophen 5-325 MG tablet Commonly known as: NORCO/VICODIN Replaced by: HYDROcodone-acetaminophen 10-325 MG tablet   predniSONE 50 MG tablet Commonly known as: DELTASONE       TAKE these medications    albuterol 108 (90 Base) MCG/ACT inhaler Commonly known as: VENTOLIN HFA Inhale 1-2 puffs into the lungs every 6 (six) hours as needed for wheezing or shortness of breath.   atorvastatin 10 MG tablet Commonly known as: LIPITOR Take 10 mg by mouth daily.   carvedilol 25 MG tablet Commonly known as: COREG Take 1 tablet (25 mg total) by mouth 2 (two) times daily with a meal.   cycloSPORINE 0.05 % ophthalmic emulsion Commonly known as: RESTASIS Place 1 drop into both eyes daily as needed (dry eyes).   gabapentin 300 MG capsule Commonly known as: NEURONTIN Take 2 capsules (600 mg total) by mouth 3 (three) times daily.   HYDROcodone-acetaminophen 10-325 MG tablet Commonly known as: NORCO Take 1 tablet by mouth every 4 (four) hours as needed for moderate pain ((score 4 to 6)).  Replaces: HYDROcodone-acetaminophen 5-325 MG tablet   ibandronate 150 MG tablet Commonly known as: BONIVA Take 150 mg by mouth every 30 (thirty) days.   methocarbamol 500 MG tablet Commonly known as: ROBAXIN Take 1 tablet (500 mg total) by mouth every 6 (six) hours as needed for muscle spasms.   Vitamin D (Ergocalciferol) 1.25 MG (50000 UNIT) Caps capsule Commonly known as: DRISDOL Take 50,000 Units  by mouth once a week.   Wixela Inhub 100-50 MCG/ACT Aepb Generic drug: fluticasone-salmeterol Inhale 1 puff into the lungs 2 (two) times daily.       ASK your doctor about these medications    losartan 50 MG tablet Commonly known as: COZAAR TAKE 1 TABLET TWICE DAILY        Follow-up Information     Leiland Mihelich C, DO Follow up in 1 week(s).   Contact information: [address] [place] [address] [phone]                 Signed: Theodoro Doing Kristen [name] 12/13/2020, 9:25 AM

## 2020-12-13 NOTE — Evaluation (Signed)
Physical Therapy Re-Evaluation and Discharge Patient Details Name: Kristen [name] MRN: [id number] DOB: 1960/08/19 Today's Date: 12/13/2020   History of Present Illness  60 y.o. female presenting after syncopal episode and fall on 7/20. Pt also with recent hx of numbness/tinglinig in B hands. Imaging shows progressive degenerative changes, stenosis at C4-5 and C5-6 levels, and central cord syndrome. S/p C4-6 ACDF. PMH: DM, polyneuropathy, HTN,, HLD, chronic neck/back pain  Clinical Impression  Pt re-evaluated s/p C4-6 ACDF. Pt reports improvement in radicular symptoms and sensation. Overall, verbalizing good pain control. Education provided regarding brace use, cervical precautions, activity recommendations. Pt ambulating initial 500 ft with a RW and additional 200 ft with no assistive device without physical difficulty or gross imbalance. No further acute PT needs. Thank you for this consult.     Follow Up Recommendations No PT follow up    Equipment Recommendations  None recommended by PT    Recommendations for Other Services       Precautions / Restrictions Precautions Precautions: Fall;Cervical Precaution Booklet Issued: Yes (comment) Required Braces or Orthoses: Cervical Brace Cervical Brace: Hard collar (apply in sitting) Restrictions Weight Bearing Restrictions: No      Mobility  Bed Mobility Overal bed mobility: Modified Independent                  Transfers Overall transfer level: Modified independent Equipment used: Rolling walker (2 wheeled)                Ambulation/Gait Ambulation/Gait assistance: Modified independent (Device/Increase time) Gait Distance (Feet): 700 Feet Assistive device: Rolling walker (2 wheeled);None Gait Pattern/deviations: WFL(Within Functional Limits)     General Gait Details: Cues for upright posture, scapular depression  Stairs            Wheelchair Mobility    Modified Rankin (Stroke Patients Only)        Balance Overall balance assessment: No apparent balance deficits (not formally assessed)                                           Pertinent Vitals/Pain Pain Assessment: 0-10 Pain Score: 2  Pain Location: surgical site Pain Descriptors / Indicators: Operative site guarding Pain Intervention(s): Monitored during session    Home Living Family/patient expects to be discharged to:: Private residence Living Arrangements: Spouse/significant other Available Help at Discharge: Family Type of Home: House Home Access: Level entry     Home Layout: One level Home Equipment: [name] - quad;Cane - single point Additional Comments: lives with fiance who can provide 24/7 supervision. children live close. reports access to a shower chair if needed    Prior Function Level of Independence: Independent with assistive device(s)         Comments: cane since last back surgery, pt reports she has had x3 falls in the past year due to poor balance.     Hand Dominance   Dominant Hand: Right    Extremity/Trunk Assessment   Upper Extremity Assessment Upper Extremity Assessment: Defer to OT evaluation    Lower Extremity Assessment Lower Extremity Assessment: Overall WFL for tasks assessed    Cervical / Trunk Assessment Cervical / Trunk Assessment: Other exceptions Cervical / Trunk Exceptions: s/p ACDF  Communication   Communication: No difficulties  Cognition Arousal/Alertness: Awake/alert Behavior During Therapy: WFL for tasks assessed/performed Overall Cognitive Status: Within Functional Limits for tasks assessed  General Comments      Exercises     Assessment/Plan    PT Assessment Patent does not need any further PT services  PT Problem List Decreased activity tolerance;Decreased balance;Decreased mobility;Pain;Decreased strength       PT Treatment Interventions      PT Goals (Current goals can  be found in the Care Plan section)  Acute Rehab PT Goals Patient Stated Goal: go home PT Goal Formulation: All assessment and education complete, DC therapy    Frequency     Barriers to discharge        Co-evaluation               AM-PAC PT "6 Clicks" Mobility  Outcome Measure Help needed turning from your back to your side while in a flat bed without using bedrails?: None Help needed moving from lying on your back to sitting on the side of a flat bed without using bedrails?: None Help needed moving to and from a bed to a chair (including a wheelchair)?: None Help needed standing up from a chair using your arms (e.g., wheelchair or bedside chair)?: None Help needed to walk in hospital room?: None Help needed climbing 3-5 steps with a railing? : None 6 Click Score: 24    End of Session Equipment Utilized During Treatment: Gait belt;Cervical collar Activity Tolerance: Patient tolerated treatment well Patient left: in chair;with call bell/phone within reach Nurse Communication: Mobility status PT Visit Diagnosis: Unsteadiness on feet (R26.81);Pain;History of falling (Z91.81) Pain - part of body:  (neck)    Time: [id number] PT Time Calculation (min) (ACUTE ONLY): 29 min   Charges:   PT Evaluation $PT Eval Low Complexity: 1 Low PT Treatments $Gait Training: 8-22 mins        Wyona Almas, PT, DPT Acute Rehabilitation Services Pager [phone] Office [phone]   Deno Etienne 12/13/2020, 9:00 AM

## 2020-12-14 ENCOUNTER — Encounter (HOSPITAL_COMMUNITY): Payer: Self-pay | Admitting: Neurological Surgery

## 2021-02-14 ENCOUNTER — Encounter: Payer: Self-pay | Admitting: Physical Therapy

## 2021-02-14 ENCOUNTER — Ambulatory Visit: Payer: Medicare Other | Attending: Neurological Surgery | Admitting: Physical Therapy

## 2021-02-14 ENCOUNTER — Other Ambulatory Visit: Payer: Self-pay

## 2021-02-14 DIAGNOSIS — M6281 Muscle weakness (generalized): Secondary | ICD-10-CM | POA: Insufficient documentation

## 2021-02-14 DIAGNOSIS — M542 Cervicalgia: Secondary | ICD-10-CM | POA: Insufficient documentation

## 2021-02-14 DIAGNOSIS — Z9181 History of falling: Secondary | ICD-10-CM | POA: Insufficient documentation

## 2021-02-14 NOTE — Therapy (Signed)
[place], [place], [address] Phone: [phone]   Fax:  [phone]  Physical Therapy Evaluation  Patient Details  Name: Kristen [name] MRN: [id number] Date of Birth: [birth year] Referring Provider (PT): Dawley, Theodoro Doing, DO   Encounter Date: 02/14/2021   PT End of Session - 02/14/21 1036     Visit Number 1    Number of Visits 8    Date for PT Re-Evaluation 04/11/21    Authorization Type UHC MCR    Authorization Time Period FOTO by 6th, KX by 15th    PT Start Time 1045    PT Stop Time 1130    PT Time Calculation (min) 45 min    Activity Tolerance Patient tolerated treatment well    Behavior During Therapy WFL for tasks assessed/performed             Past Medical History:  Diagnosis Date   Allergy    Anxiety    Arthritis    Asthma    Back pain    Seen by Neurosurery   Depression    Headache    Hyperlipidemia    Hypertension    Leg pain    Bil   Macromastia    Obesity    Peripheral neuropathy    Post-menopausal    Pre-diabetes    Sleep apnea    no cpap    Past Surgical History:  Procedure Laterality Date   ANTERIOR CERVICAL DECOMP/DISCECTOMY FUSION N/A 12/12/2020   Procedure: Anterior Cervical Decompression Fusion Cervical four-five, Cervical five-six;  Surgeon: Karsten Ro, DO;  Location: [facility];  Service: Neurosurgery;  Laterality: N/A;   BACK SURGERY     BREAST REDUCTION SURGERY Bilateral 02/13/2018   Procedure: MAMMARY REDUCTION  (BREAST);  Surgeon: Irene Limbo, MD;  Location: [facility];  Service: Plastics;  Laterality: Bilateral;   BREAST SURGERY     carpel tunnel release Bilateral    CHOLECYSTECTOMY N/A 03/16/2020   Procedure: LAPAROSCOPIC CHOLECYSTECTOMY WITH INTRAOPERATIVE CHOLANGIOGRAM;  Surgeon: Donnie Mesa, MD;  Location: [facility];  Service: General;  Laterality: N/A;   COLONOSCOPY     LAMINECTOMY  01/20/2008   L4-L5 by Dr Latanya Maudlin  (Ortho), [facility]   POLYPECTOMY     REDUCTION MAMMAPLASTY Bilateral 01/2018   TUBAL LIGATION      There were no vitals filed for this visit.    Subjective Assessment - 02/14/21 1044     Subjective Patient reports she had surgery for cervical fusion that has resulted in hand and grip weakness. She denies any pain in her neck, arms, or hands. Patient reports that gripping, opening jars or other items has gotten much more difficult, and she feels like she is dropping items. She does note occasional twitching of her neck and still some difficulty performing certain household tasks because of her neck. Patient also reports that her balance is off and she feels like she can't walk straight resulting in falls.    Pertinent History ACDF 4-6 on 12/12/2020    Limitations House hold activities;Lifting    How long can you sit comfortably? No limitation    How long can you stand comfortably? No limitation    How long can you walk comfortably? No limitation    Patient Stated Goals Get hands stronger to improve grip strength    Currently in Pain? No/denies                [facility] PT Assessment -  02/14/21 0001       Assessment   Medical Diagnosis Spondylosis without myelopathy or radiculopathy, cervical region    Referring Provider (PT) Dawley, Theodoro Doing, DO    Onset Date/Surgical Date 12/12/20    Hand Dominance Right    Prior Therapy Yes      Precautions   Precautions Fall      Restrictions   Weight Bearing Restrictions No      Balance Screen   Has the patient fallen in the past 6 months Yes    How many times? 2, patient reports falls occurred due to legs giving out and fainting    Has the patient had a decrease in activity level because of a fear of falling?  Yes    Is the patient reluctant to leave their home because of a fear of falling?  No      Home Environment   Living Environment Private residence    Type of [place] Access Level entry    Home Layout One  level      Prior Function   Level of [place] On disability    Leisure Used to do a lot of walking but has to reduce this      Cognition   Overall Cognitive Status Within Functional Limits for tasks assessed      Observation/Other Assessments   Observations Patient appears in no apparent distress    Focus on Therapeutic Outcomes (FOTO)  40% functional status      Sensation   Light Touch Appears Intact      Coordination   Gross Motor Movements are Fluid and Coordinated Yes      Functional Tests   Functional tests Single leg stance      Single Leg Stance   Comments Unable to maintain without UE support      Posture/Postural Control   Posture Comments Rounded shoulder and forward head posture      ROM / Strength   AROM / PROM / Strength AROM;Strength      AROM   Overall AROM Comments Patient demonstrates shoulder, elbow, wrist AROM grossly WFL with report of neck tightness    AROM Assessment Site Cervical    Cervical Flexion 50    Cervical Extension 40    Cervical - Right Side Bend 30    Cervical - Left Side Bend 40    Cervical - Right Rotation 50    Cervical - Left Rotation 55      Strength   Overall Strength Comments Periscapular strength grossly 4-/5 MMT bilaterally    Strength Assessment Site Shoulder;Elbow;Forearm;Wrist;Hand    Right/Left Shoulder Right;Left    Right Shoulder Flexion 4/5    Right Shoulder Extension 4/5    Right Shoulder ABduction 4/5    Right Shoulder Internal Rotation 4/5    Right Shoulder External Rotation 4/5    Left Shoulder Flexion 4/5    Left Shoulder Extension 4/5    Left Shoulder ABduction 4/5    Left Shoulder Internal Rotation 4/5    Left Shoulder External Rotation 4/5    Right/Left Elbow Right;Left    Right Elbow Flexion 4/5    Right Elbow Extension 4/5    Left Elbow Flexion 4/5    Left Elbow Extension 4/5    Right/Left Forearm Right;Left    Right Forearm Pronation 4/5    Right Forearm Supination  4/5    Left Forearm Pronation 4/5  Left Forearm Supination 4/5    Right/Left Wrist Right;Left    Right Wrist Flexion 4/5    Right Wrist Extension 4/5    Right Wrist Radial Deviation 4/5    Right Wrist Ulnar Deviation 4/5    Left Wrist Flexion 4-/5    Left Wrist Extension 4-/5    Left Wrist Radial Deviation 4-/5    Left Wrist Ulnar Deviation 4-/5    Right/Left hand Right;Left    Right Hand Grip (lbs) 30, 29, 38    Left Hand Grip (lbs) 10, 17, 23      Palpation   Palpation comment Non-TTP      Transfers   Transfers Independent with all Transfers      Balance   Balance Assessed Yes      Static Standing Balance   Static Standing Balance -  Activities  Romberg - Eyes Opened   patient demonstrates increased sway                       Objective measurements completed on examination: See above findings.       [facility] Adult PT Treatment/Exercise - 02/14/21 0001       Neuro Re-ed    Neuro Re-ed Details  1/2 tandem x 20 sec each      Exercises   Exercises Neck      Neck Exercises: Theraband   Rows 10 reps    Rows Limitations yellow      Neck Exercises: Seated   Other Seated Exercise Wrist flexion / extenson with forearm support x 10 each - yellow band    Other Seated Exercise Putty grip and tip pinch x 10 - yellow putty                     PT Education - 02/14/21 1036     Education Details Exam findings, POC, HEP    Person(s) Educated Patient    Methods Explanation;Demonstration;Tactile cues;Verbal cues;Handout    Comprehension Verbalized understanding;Returned demonstration;Verbal cues required;Tactile cues required;Need further instruction              PT Short Term Goals - 02/14/21 1037       PT SHORT TERM GOAL #1   Title Patient will be I with initial HEP to progress with PT    Time 4    Period Weeks    Status New    Target Date 03/14/21      PT SHORT TERM GOAL #2   Title PT will review FOTO with patient by 3rd visit to  understand expected progress with therapy    Time 3    Period Weeks    Status New    Target Date 03/07/21               PT Long Term Goals - 02/14/21 1038       PT LONG TERM GOAL #1   Title Patient will be I with final HEP to maintain progress from PT    Time 8    Period Weeks    Status New    Target Date 04/11/21      PT LONG TERM GOAL #2   Title Patient will report >/= 51% status on FOTO to indicate improved functional ability    Time 8    Period Weeks    Status New    Target Date 04/11/21      PT LONG TERM GOAL #3   Title Patient will  demonstrate >/= 4+/5 grossly UE strength to improve lifting and household tasks    Time 8    Period Weeks    Status New    Target Date 04/11/21      PT LONG TERM GOAL #4   Title Patient will demonstrate >/= 40 lbs of average grip strength in order to improve open jars    Time 8    Period Weeks    Status New    Target Date 04/11/21      PT LONG TERM GOAL #5   Title Patient will be able to perform SLS >/= 10 seconds in order to improve balance and reduce fall risk    Time 8    Period Weeks    Status New    Target Date 04/11/21                    Plan - 02/14/21 1231     Clinical Impression Statement Patient presents to PT following ACDF levels 4-6 on 12/12/2020 and resulting hand and UE weakness. She does exhibit gross limitation in shoulder, elbow, wrist, and grip strength leading to difficulty performing household task requiring any type of lifting or hold objects and opening lids on jars. She has good overall range of motion of the BUEs but did report some tightness of the neck and she demonstrates rounded shoulder posture. Patient also demonstrates increased sway with romberg stance and inability to balance on single leg. Patient was provided exercises to initiate strengthening for the wrist/grip and postural musculature, as well as incorporating balance exercises due to balance limitation and history of falls. She  would benefit from continued skilled PT to progress her strength and UE functional use in order to reduce limitations with household tasks.    Personal Factors and Comorbidities Fitness;Past/Current Experience;Time since onset of injury/illness/exacerbation    Examination-Activity Limitations Lift;Carry;Locomotion Level;Stand    Examination-Participation Restrictions Meal Prep;Cleaning;Shop;Laundry;Yard Work    Stability/Clinical Decision Making Stable/Uncomplicated    Designer, jewellery Low    Rehab Potential Good    PT Frequency 1x / week    PT Duration 8 weeks    PT Treatment/Interventions ADLs/Self Care Home Management;Aquatic Therapy;Cryotherapy;Electrical Stimulation;Iontophoresis 4mg /ml Dexamethasone;Moist Heat;Traction;Ultrasound;Neuromuscular re-education;Balance training;Therapeutic exercise;Therapeutic activities;Functional mobility training;Stair training;Gait training;Patient/family education;Manual techniques;Dry needling;Passive range of motion;Taping;Vasopneumatic Device;Spinal Manipulations;Joint Manipulations    PT Next Visit Plan Review HEP and progress PRN, continue grip and general UE/postural strengthening, balance training, neurodynamic testing PRN    PT Home Exercise Plan [email or web address]    Consulted and Agree with Plan of Care Patient             Patient will benefit from skilled therapeutic intervention in order to improve the following deficits and impairments:  Decreased balance, Decreased activity tolerance, Impaired UE functional use, Postural dysfunction, Decreased strength  Visit Diagnosis: Cervicalgia  Muscle weakness (generalized)  History of falling     Problem List Patient Active Problem List   Diagnosis Date Noted   Syncope 12/07/2020   Right upper quadrant pain 06/02/2019   Abdominal pain 10/07/2018   Major depressive disorder, recurrent episode, moderate ([place]) 06/18/2018   Unexplained night sweats 06/18/2018   Chronic pain of right knee  11/05/2017   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   Encounter for routine gynecological examination with Papanicolaou smear of cervix 07/20/2014   Obstructive sleep apnea 12/26/2011   Prediabetes 07/18/2011   UNSPEC HEREDIT&IDIOPATHIC PERIPHERAL NEUROPATHY 04/16/2010   Allergic rhinitis 09/15/2008   MENOPAUSAL SYNDROME 09/05/2008   Hyperlipemia 06/12/2007   HYPERTENSION,  BENIGN SYSTEMIC 07/17/2006    Hilda Blades, PT, DPT, LAT, ATC 02/14/21  12:47 PM Phone: [phone] Fax: [facility] [address] [place], [place], [address] Phone: [phone]   Fax:  [phone]  Name: Kristen [name] MRN: [id number] Date of Birth: Sep 24, 1960

## 2021-02-14 NOTE — Patient Instructions (Signed)
Access Code: [email or web address] URL: [email or web address] Date: 02/14/2021 Prepared by: Hilda Blades  Exercises Putty Squeezes - 2-3 x daily - 7 x weekly - 2 sets - 10 reps Thumb Opposition with Putty - 2-3 x daily - 7 x weekly - 2 sets - 10 reps Seated Wrist Extension with Anchored Resistance - 2-3 x daily - 7 x weekly - 2 sets - 10 reps Seated Wrist Flexion with Anchored Resistance - 2-3 x daily - 7 x weekly - 2 sets - 10 reps Standing Row with Anchored Resistance - 2-3 x daily - 7 x weekly - 2 sets - 20 reps Standing Romberg to 1/2 Tandem Stance - 1 x daily - 7 x weekly - 3 sets - 10 reps

## 2021-02-20 ENCOUNTER — Ambulatory Visit: Payer: Medicare Other | Admitting: Physical Therapy

## 2021-02-27 ENCOUNTER — Ambulatory Visit: Payer: Medicare Other | Attending: Neurological Surgery | Admitting: Physical Therapy

## 2021-02-27 ENCOUNTER — Other Ambulatory Visit: Payer: Self-pay

## 2021-02-27 ENCOUNTER — Encounter: Payer: Self-pay | Admitting: Physical Therapy

## 2021-02-27 DIAGNOSIS — M542 Cervicalgia: Secondary | ICD-10-CM | POA: Diagnosis not present

## 2021-02-27 DIAGNOSIS — M6281 Muscle weakness (generalized): Secondary | ICD-10-CM | POA: Insufficient documentation

## 2021-02-27 DIAGNOSIS — Z9181 History of falling: Secondary | ICD-10-CM | POA: Diagnosis present

## 2021-02-27 NOTE — Therapy (Signed)
[place], [place], [address] Phone: [phone]   Fax:  [phone]  Physical Therapy Treatment  Patient Details  Name: Kristen [name] MRN: [id number] Date of Birth: [birth year] Referring Provider (PT): Dawley, Theodoro Doing, DO   Encounter Date: 02/27/2021   PT End of Session - 02/27/21 1045     Visit Number 2    Number of Visits 8    Date for PT Re-Evaluation 04/11/21    Authorization Type UHC MCR    Authorization Time Period FOTO by 6th, KX by 15th    PT Start Time 1045    PT Stop Time 1125    PT Time Calculation (min) 40 min    Activity Tolerance Patient tolerated treatment well    Behavior During Therapy WFL for tasks assessed/performed             Past Medical History:  Diagnosis Date   Allergy    Anxiety    Arthritis    Asthma    Back pain    Seen by Neurosurery   Depression    Headache    Hyperlipidemia    Hypertension    Leg pain    Bil   Macromastia    Obesity    Peripheral neuropathy    Post-menopausal    Pre-diabetes    Sleep apnea    no cpap    Past Surgical History:  Procedure Laterality Date   ANTERIOR CERVICAL DECOMP/DISCECTOMY FUSION N/A 12/12/2020   Procedure: Anterior Cervical Decompression Fusion Cervical four-five, Cervical five-six;  Surgeon: Karsten Ro, DO;  Location: [facility];  Service: Neurosurgery;  Laterality: N/A;   BACK SURGERY     BREAST REDUCTION SURGERY Bilateral 02/13/2018   Procedure: MAMMARY REDUCTION  (BREAST);  Surgeon: Irene Limbo, MD;  Location: [facility];  Service: Plastics;  Laterality: Bilateral;   BREAST SURGERY     carpel tunnel release Bilateral    CHOLECYSTECTOMY N/A 03/16/2020   Procedure: LAPAROSCOPIC CHOLECYSTECTOMY WITH INTRAOPERATIVE CHOLANGIOGRAM;  Surgeon: Donnie Mesa, MD;  Location: [facility];  Service: General;  Laterality: N/A;   COLONOSCOPY     LAMINECTOMY  01/20/2008   L4-L5 by Dr Latanya Maudlin  ([facility]th[facility]   POLYPECTOMY     REDUCTION MAMMAPLASTY Bilateral 01/2018   TUBAL LIGATION      There were no vitals filed for this visit.   Subjective Assessment - 02/27/21 1046     Subjective Patient reports everything is going fine, exercises are good. States her balance is still off. She does feel like her grip strength is getting better, but it's not great.    Patient Stated Goals Get hands stronger to improve grip strength    Currently in Pain? No/denies                [facility] PT Assessment - 02/27/21 0001       Strength   Right Hand Grip (lbs) 35, 40, 32    Left Hand Grip (lbs) 24, 20, 20                     [facility] Adult PT Treatment/Exercise:  Therapeutic Exercise: - NuStep L4 x 5 min with UE/LE while taking subjective  - Wrist flexion / extension with 2# 2 x 10 - Gripping 3 x 10 using digiflex 5# - Pronation / supination with 1# "hammer" 2 x 10 - Farmer's carry holds 2 x 30 sec - Row with green 2  x 20  -Standing hip abduction and extension at counter x 10 each  Manual Therapy: - n/a  Neuromuscular re-ed: - 1/2 tandem 2 x 30 sec - Romberg with head turns and nods 2 x 30 sec each  Therapeutic Activity: - n/a  Modalities: - n/a                PT Education - 02/27/21 1045     Education Details HEP    Person(s) Educated Patient    Methods Explanation;Demonstration;Tactile cues;Handout    Comprehension Verbalized understanding;Returned demonstration;Verbal cues required;Need further instruction              PT Short Term Goals - 02/14/21 1037       PT SHORT TERM GOAL #1   Title Patient will be I with initial HEP to progress with PT    Time 4    Period Weeks    Status New    Target Date 03/14/21      PT SHORT TERM GOAL #2   Title PT will review FOTO with patient by 3rd visit to understand expected progress with therapy    Time 3    Period Weeks    Status New    Target Date 03/07/21                PT Long Term Goals - 02/14/21 1038       PT LONG TERM GOAL #1   Title Patient will be I with final HEP to maintain progress from PT    Time 8    Period Weeks    Status New    Target Date 04/11/21      PT LONG TERM GOAL #2   Title Patient will report >/= 51% status on FOTO to indicate improved functional ability    Time 8    Period Weeks    Status New    Target Date 04/11/21      PT LONG TERM GOAL #3   Title Patient will demonstrate >/= 4+/5 grossly UE strength to improve lifting and household tasks    Time 8    Period Weeks    Status New    Target Date 04/11/21      PT LONG TERM GOAL #4   Title Patient will demonstrate >/= 40 lbs of average grip strength in order to improve open jars    Time 8    Period Weeks    Status New    Target Date 04/11/21      PT LONG TERM GOAL #5   Title Patient will be able to perform SLS >/= 10 seconds in order to improve balance and reduce fall risk    Time 8    Period Weeks    Status New    Target Date 04/11/21                   Plan - 02/27/21 1051     Clinical Impression Statement Patient tolerated therapy well with no adverse effects. Therapy focused on progress hand/wrist/forearm strength and balance training this visit. Patient does exhibit slight improvement with grip strength but over continues to have strength deficit greater on left. She did well with balance training this visit but does continue to demonstrate increased sway and occasional use of UEs for support. She would benefit from continued skilled PT to progress her strength and UE functional use in order to reduce limitations with household tasks.    PT Treatment/Interventions ADLs/Self Care Home  Management;Aquatic Therapy;Cryotherapy;Electrical Stimulation;Iontophoresis 4mg /ml Dexamethasone;Moist Heat;Traction;Ultrasound;Neuromuscular re-education;Balance training;Therapeutic exercise;Therapeutic activities;Functional mobility training;Stair training;Gait  training;Patient/family education;Manual techniques;Dry needling;Passive range of motion;Taping;Vasopneumatic Device;Spinal Manipulations;Joint Manipulations    PT Next Visit Plan Review HEP and progress PRN, continue grip and general UE/postural strengthening, balance training, neurodynamic testing PRN    PT Home Exercise Plan [email or web address]    Consulted and Agree with Plan of Care Patient             Patient will benefit from skilled therapeutic intervention in order to improve the following deficits and impairments:  Decreased balance, Decreased activity tolerance, Impaired UE functional use, Postural dysfunction, Decreased strength  Visit Diagnosis: Cervicalgia  Muscle weakness (generalized)  History of falling     Problem List Patient Active Problem List   Diagnosis Date Noted   Syncope 12/07/2020   Right upper quadrant pain 06/02/2019   Abdominal pain 10/07/2018   Major depressive disorder, recurrent episode, moderate ([place]) 06/18/2018   Unexplained night sweats 06/18/2018   Chronic pain of right knee 11/05/2017   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   Encounter for routine gynecological examination with Papanicolaou smear of cervix 07/20/2014   Obstructive sleep apnea 12/26/2011   Prediabetes 07/18/2011   UNSPEC HEREDIT&IDIOPATHIC PERIPHERAL NEUROPATHY 04/16/2010   Allergic rhinitis 09/15/2008   MENOPAUSAL SYNDROME 09/05/2008   Hyperlipemia 06/12/2007   HYPERTENSION, BENIGN SYSTEMIC 07/17/2006    Hilda Blades, PT, DPT, LAT, ATC 02/27/21  11:28 AM Phone: [phone] Fax: [facility] [address] [place], [place], [address] Phone: [phone]   Fax:  [phone]  Name: Kristen [name] MRN: [id number] Date of Birth: 01/09/1961

## 2021-03-06 ENCOUNTER — Encounter: Payer: Self-pay | Admitting: Physical Therapy

## 2021-03-06 ENCOUNTER — Other Ambulatory Visit: Payer: Self-pay

## 2021-03-06 ENCOUNTER — Ambulatory Visit: Payer: Medicare Other | Admitting: Physical Therapy

## 2021-03-06 DIAGNOSIS — Z9181 History of falling: Secondary | ICD-10-CM

## 2021-03-06 DIAGNOSIS — M542 Cervicalgia: Secondary | ICD-10-CM | POA: Diagnosis not present

## 2021-03-06 DIAGNOSIS — M6281 Muscle weakness (generalized): Secondary | ICD-10-CM

## 2021-03-06 NOTE — Therapy (Addendum)
[place], [place], [address] Phone: [phone]   Fax:  [phone]  Physical Therapy Treatment / Discharge  Patient Details  Name: Kristen [name] MRN: [id number] Date of Birth: March 21, 1961 Referring Provider (PT): Dawley, Theodoro Doing, DO   Encounter Date: 03/06/2021   PT End of Session - 03/06/21 1054     Visit Number 3    Number of Visits 8    Date for PT Re-Evaluation 04/11/21    Authorization Type UHC MCR    Authorization Time Period FOTO by 6th, KX by 15th    PT Start Time 1049    PT Stop Time 1128    PT Time Calculation (min) 39 min    Equipment Utilized During Treatment Gait belt    Activity Tolerance Patient tolerated treatment well    Behavior During Therapy WFL for tasks assessed/performed             Past Medical History:  Diagnosis Date   Allergy    Anxiety    Arthritis    Asthma    Back pain    Seen by Neurosurery   Depression    Headache    Hyperlipidemia    Hypertension    Leg pain    Bil   Macromastia    Obesity    Peripheral neuropathy    Post-menopausal    Pre-diabetes    Sleep apnea    no cpap    Past Surgical History:  Procedure Laterality Date   ANTERIOR CERVICAL DECOMP/DISCECTOMY FUSION N/A 12/12/2020   Procedure: Anterior Cervical Decompression Fusion Cervical four-five, Cervical five-six;  Surgeon: Karsten Ro, DO;  Location: [facility];  Service: Neurosurgery;  Laterality: N/A;   BACK SURGERY     BREAST REDUCTION SURGERY Bilateral 02/13/2018   Procedure: MAMMARY REDUCTION  (BREAST);  Surgeon: Irene Limbo, MD;  Location: [facility];  Service: Plastics;  Laterality: Bilateral;   BREAST SURGERY     carpel tunnel release Bilateral    CHOLECYSTECTOMY N/A 03/16/2020   Procedure: LAPAROSCOPIC CHOLECYSTECTOMY WITH INTRAOPERATIVE CHOLANGIOGRAM;  Surgeon: Donnie Mesa, MD;  Location: [facility];  Service: General;  Laterality: N/A;   COLONOSCOPY      LAMINECTOMY  01/20/2008   L4-L5 by Dr Latanya Maudlin (Ortho), [facility]   POLYPECTOMY     REDUCTION MAMMAPLASTY Bilateral 01/2018   TUBAL LIGATION      There were no vitals filed for this visit.   Subjective Assessment - 03/06/21 1053     Subjective Patient reports she is doing well and feels like she is improving.    Patient Stated Goals Get hands stronger to improve grip strength    Currently in Pain? No/denies                [facility] PT Assessment - 03/06/21 0001       Strength   Left Hand Grip (lbs) 23, 18, 22                      [facility] Adult PT Treatment/Exercise:   Therapeutic Exercise: - UBE L1 x 4 min (2 fwd/bwd) while taking subjective  - Row with green 2 x 15 - Bicep curl in supination with green 2 x 15 - Tricep push down with green 2 x 15  - Retro walking with FM 7# x 5 - Standing hip abduction and extension at counter x 10 each   - Wrist flexion / extension with 3#  2 x 10 - Pronation / supination with 1# "hammer" 2 x 10 - Farmer's carry with 5# bilat 2 x 29f and unlateral 2 x 331feach   Manual Therapy: - n/a   Neuromuscular re-ed: - Romberg on Airex 2 x 30 sec bilat  - 1/2 tandem on Airex 2 x 30 sec bilat  - Tandem stance 2 x 20 sec   Therapeutic Activity: - n/a   Modalities: - n/a              PT Education - 03/06/21 1054     Education Details HEP    Person(s) Educated Patient    Methods Explanation;Demonstration;Verbal cues    Comprehension Verbalized understanding;Returned demonstration;Verbal cues required;Need further instruction              PT Short Term Goals - 03/06/21 1055       PT SHORT TERM GOAL #1   Title Patient will be I with initial HEP to progress with PT    Time 4    Period Weeks    Status On-going    Target Date 03/14/21      PT SHORT TERM GOAL #2   Title PT will review FOTO with patient by 3rd visit to understand expected progress with therapy    Baseline reviewed  on 3rd visit    Time 3    Period Weeks    Status Achieved    Target Date 03/07/21               PT Long Term Goals - 02/14/21 1038       PT LONG TERM GOAL #1   Title Patient will be I with final HEP to maintain progress from PT    Time 8    Period Weeks    Status New    Target Date 04/11/21      PT LONG TERM GOAL #2   Title Patient will report >/= 51% status on FOTO to indicate improved functional ability    Time 8    Period Weeks    Status New    Target Date 04/11/21      PT LONG TERM GOAL #3   Title Patient will demonstrate >/= 4+/5 grossly UE strength to improve lifting and household tasks    Time 8    Period Weeks    Status New    Target Date 04/11/21      PT LONG TERM GOAL #4   Title Patient will demonstrate >/= 40 lbs of average grip strength in order to improve open jars    Time 8    Period Weeks    Status New    Target Date 04/11/21      PT LONG TERM GOAL #5   Title Patient will be able to perform SLS >/= 10 seconds in order to improve balance and reduce fall risk    Time 8    Period Weeks    Status New    Target Date 04/11/21                   Plan - 03/06/21 1054     Clinical Impression Statement Patient tolerated therapy well with no adverse effects. Incorporated greater levels of balance training and postural/UE strengthening this visit with good tolerance. Patient continues to demonstrate strength deficit L > R but is progressing well with exercises. She seems to be improving with her balance and tolerated addition of unstable foam surface well. She does continue  to require occasional UE support and with increased sway with narrower BOS. She would benefit from continued skilled PT to progress her strength and UE functional use in order to reduce limitations with household tasks.    PT Treatment/Interventions ADLs/Self Care Home Management;Aquatic Therapy;Cryotherapy;Electrical Stimulation;Iontophoresis 62m/ml Dexamethasone;Moist  Heat;Traction;Ultrasound;Neuromuscular re-education;Balance training;Therapeutic exercise;Therapeutic activities;Functional mobility training;Stair training;Gait training;Patient/family education;Manual techniques;Dry needling;Passive range of motion;Taping;Vasopneumatic Device;Spinal Manipulations;Joint Manipulations    PT Next Visit Plan Review HEP and progress PRN, continue grip and general UE/postural strengthening, balance training, neurodynamic testing PRN    PT Home Exercise Plan [id number]    Consulted and Agree with Plan of Care Patient             Patient will benefit from skilled therapeutic intervention in order to improve the following deficits and impairments:  Decreased balance, Decreased activity tolerance, Impaired UE functional use, Postural dysfunction, Decreased strength  Visit Diagnosis: Cervicalgia  Muscle weakness (generalized)  History of falling     Problem List Patient Active Problem List   Diagnosis Date Noted   Syncope 12/07/2020   Right upper quadrant pain 06/02/2019   Abdominal pain 10/07/2018   Major depressive disorder, recurrent episode, moderate (HPablo Pena 06/18/2018   Unexplained night sweats 06/18/2018   Chronic pain of right knee 11/05/2017   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   Encounter for routine gynecological examination with Papanicolaou smear of cervix 07/20/2014   Obstructive sleep apnea 12/26/2011   Prediabetes 07/18/2011   UNSPEC HEREDIT&IDIOPATHIC PERIPHERAL NEUROPATHY 04/16/2010   Allergic rhinitis 09/15/2008   MENOPAUSAL SYNDROME 09/05/2008   Hyperlipemia 06/12/2007   HYPERTENSION, BENIGN SYSTEMIC 07/17/2006    CHilda Blades PT, DPT, LAT, ATC 03/06/21  12:26 PM Phone: [phone]: [address] [address]Phone: [phone]  Fax:  [phone] Name: FMCKENZY SALAZARMRN: [id number] of Birth: 506/05/1960  PHYSICAL  THERAPY DISCHARGE SUMMARY  Visits from Start of Care: 3  Current functional level related to goals / functional outcomes: See above   Remaining deficits: See above   Education / Equipment: HEP   Patient agrees to discharge. Patient goals were partially met. Patient is being discharged due to not returning since the last visit.  CHilda Blades PT, DPT, LAT, ATC 04/03/21  10:45 AM Phone: [phone]: [phone]

## 2021-03-13 ENCOUNTER — Telehealth: Payer: Self-pay | Admitting: Physical Therapy

## 2021-03-13 ENCOUNTER — Ambulatory Visit: Payer: Medicare Other | Admitting: Physical Therapy

## 2021-03-13 NOTE — Telephone Encounter (Signed)
Attempted to contact patient due to missed PT appointment. No answer and unable to leave VM due to voice mailbox not being set up.  Hilda Blades, PT, DPT, LAT, ATC 03/13/21  1:03 PM Phone: [phone] Fax: [phone]

## 2021-03-28 ENCOUNTER — Telehealth: Payer: Self-pay | Admitting: Physical Therapy

## 2021-03-28 ENCOUNTER — Ambulatory Visit: Payer: Medicare Other | Attending: Neurological Surgery | Admitting: Physical Therapy

## 2021-03-28 NOTE — Telephone Encounter (Signed)
Attempted to contact patient due to missed PT appointment. No answer and unable to leave VM due to voice mailbox not being set up.  Hilda Blades, PT, DPT, LAT, ATC 03/28/21  4:05 PM Phone: [phone] Fax: [phone]

## 2021-05-16 ENCOUNTER — Encounter: Payer: Self-pay | Admitting: Gastroenterology

## 2021-06-15 ENCOUNTER — Other Ambulatory Visit: Payer: Self-pay | Admitting: Neurological Surgery

## 2021-06-15 DIAGNOSIS — M4316 Spondylolisthesis, lumbar region: Secondary | ICD-10-CM

## 2021-07-07 ENCOUNTER — Other Ambulatory Visit: Payer: Medicare Other

## 2021-07-13 ENCOUNTER — Other Ambulatory Visit: Payer: Self-pay | Admitting: Obstetrics and Gynecology

## 2021-07-16 ENCOUNTER — Inpatient Hospital Stay: Admission: RE | Admit: 2021-07-16 | Payer: Medicare Other | Source: Ambulatory Visit

## 2021-08-05 ENCOUNTER — Other Ambulatory Visit: Payer: Self-pay

## 2021-08-05 ENCOUNTER — Emergency Department (HOSPITAL_COMMUNITY)
Admission: EM | Admit: 2021-08-05 | Discharge: 2021-08-05 | Disposition: A | Discharge status: Home / Self Care | Payer: Medicare Other | Attending: Emergency Medicine | Admitting: Emergency Medicine

## 2021-08-05 ENCOUNTER — Encounter (HOSPITAL_COMMUNITY): Payer: Self-pay

## 2021-08-05 ENCOUNTER — Emergency Department (HOSPITAL_COMMUNITY): Payer: Medicare Other

## 2021-08-05 DIAGNOSIS — Z79899 Other long term (current) drug therapy: Secondary | ICD-10-CM | POA: Insufficient documentation

## 2021-08-05 DIAGNOSIS — W19XXXA Unspecified fall, initial encounter: Secondary | ICD-10-CM | POA: Diagnosis not present

## 2021-08-05 DIAGNOSIS — M5412 Radiculopathy, cervical region: Secondary | ICD-10-CM | POA: Diagnosis not present

## 2021-08-05 DIAGNOSIS — I10 Essential (primary) hypertension: Secondary | ICD-10-CM | POA: Insufficient documentation

## 2021-08-05 DIAGNOSIS — M542 Cervicalgia: Secondary | ICD-10-CM | POA: Diagnosis present

## 2021-08-05 MED ORDER — PREDNISONE 20 MG PO TABS: 40.0000 mg | ORAL_TABLET | Freq: Every day | ORAL | 0 refills | Status: DC

## 2021-08-05 NOTE — ED Triage Notes (Signed)
Patient c/o bilateral shoulder pain that radiates down both arms. ?Patient denies any injury or heavy lifting. ?

## 2021-08-05 NOTE — ED Notes (Signed)
Patient transported to CT 

## 2021-08-05 NOTE — Discharge Instructions (Addendum)
You were seen here today for evaluation of an acute flare of your chronic neck pain. We are going to place you on Prednisone for 5 days for your symptoms. Please consult your pain management provider for additional pain medication if they see it warranted. Most importantly, follow up with your spine surgeon. If you have weakness, color changes, numbness, worsening neck pain, please return to the nearest ER for re-evaluation.  ? ?Contact a doctor if: ?Your condition does not get better with treatment. ?Get help right away if: ?Your pain gets worse and medicine does not help. ?You lose feeling or feel weak in your hand, arm, face, or leg. ?You have a high fever. ?Your neck is stiff. ?You cannot control when you poop or pee (have incontinence). ?You have trouble with walking, balance, or talking. ?

## 2021-08-05 NOTE — ED Provider Notes (Signed)
?[facility] DEPT ?Provider Note ? ? ?CSN: [id number] ?Arrival date & time: 08/05/21  1721 ? ?  ? ?History ?Chief Complaint  ?Patient presents with  ? Shoulder Pain  ? Arm Pain  ? ? ?Kristen [name] is a 61 y.o. female with h/o HTN, HLD, and neck surgery presents to the ED for evaluation of bilateral arm and hand "pins and needles" feeling for the past week. She denies any trauma to the area, but mentions she did have a ground level fall a few weeks ago were she landed on her bottom and did not hit her next. She reports some bilateral arm weakness, but it has not interfered with her ADLs. She denies any sensory deficits. She reports she has neck pain, but it is unchanged from it's chronic state. Denies any fevers or IVDU ever. ? ? ?Shoulder Pain ?Associated symptoms: neck pain   ?Arm Pain ? ? ?  ? ?Home Medications ?Prior to Admission medications   ?Medication Sig Start Date End Date Taking? Authorizing Provider  ?predniSONE (DELTASONE) 20 MG tablet Take 2 tablets (40 mg total) by mouth daily. 08/05/21  Yes Sherrell Puller, PA-C  ?albuterol (VENTOLIN HFA) 108 (90 Base) MCG/ACT inhaler Inhale 1-2 puffs into the lungs every 6 (six) hours as needed for wheezing or shortness of breath. 11/21/20   Dorie Rank, MD  ?atorvastatin (LIPITOR) 10 MG tablet Take 10 mg by mouth daily. 11/09/20   [provider]  ?carvedilol (COREG) 25 MG tablet Take 1 tablet (25 mg total) by mouth 2 (two) times daily with a meal. 08/21/19   Richarda Osmond, MD  ?cycloSPORINE (RESTASIS) 0.05 % ophthalmic emulsion Place 1 drop into both eyes daily as needed (dry eyes).    [provider]  ?gabapentin (NEURONTIN) 300 MG capsule Take 2 capsules (600 mg total) by mouth 3 (three) times daily. 12/13/20   Dawley, Theodoro Doing, DO  ?HYDROcodone-acetaminophen (NORCO) 10-325 MG tablet Take 1 tablet by mouth every 4 (four) hours as needed for moderate pain ((score 4 to 6)). 12/13/20   Dawley, Troy C, DO  ?ibandronate  (BONIVA) 150 MG tablet Take 150 mg by mouth every 30 (thirty) days. 11/12/20   [provider]  ?losartan (COZAAR) 50 MG tablet TAKE 1 TABLET TWICE DAILY ?Patient taking differently: Take 50 mg by mouth daily. 02/23/20   Richarda Osmond, MD  ?methocarbamol (ROBAXIN) 500 MG tablet Take 1 tablet (500 mg total) by mouth every 6 (six) hours as needed for muscle spasms. 12/13/20   Dawley, Theodoro Doing, DO  ?Vitamin D, Ergocalciferol, (DRISDOL) 1.25 MG (50000 UNIT) CAPS capsule Take 50,000 Units by mouth once a week. 11/02/20   [provider]  ?Grant Ruts INHUB 100-50 MCG/ACT AEPB Inhale 1 puff into the lungs 2 (two) times daily. 11/24/20   [provider]  ?   ? ?Allergies    ?Amoxicillin and Penicillins   ? ?Review of Systems   ?Review of Systems  ?Musculoskeletal:  Positive for myalgias and neck pain.  ? ?Physical Exam ?Updated Vital Signs ?BP 126/72 (BP Location: Right Arm)   Pulse 86   Temp 98.1 ?F (36.7 ?C) (Oral)   Resp 16   Ht '5\' 3"'$  (1.6 m)   Wt 74.8 kg   SpO2 96%   BMI 29.23 kg/m?  ?Physical Exam ?Vitals and nursing note reviewed.  ?Constitutional:   ?   General: She is not in acute distress. ?   Appearance: Normal appearance. She is not ill-appearing or  toxic-appearing.  ?   Comments: Texting on her phone with both hands without difficulty.   ?HENT:  ?   Head: Normocephalic and atraumatic.  ?   Mouth/Throat:  ?   Mouth: Mucous membranes are moist.  ?Eyes:  ?   General: No scleral icterus. ?Neck:  ?   Comments: Some paraspinal and midline tenderness which she reports is typical for her. I do not appreciate any step offs or deformities. No areas of overlying erythema or warmth. No overlying skin changes noted. Mildly limited ROM, probably secondary to hardware. The patient reports this is baseline for her. Negative Spurling's. ?Cardiovascular:  ?   Rate and Rhythm: Normal rate and regular rhythm.  ?Pulmonary:  ?   Effort: Pulmonary effort is normal. No respiratory distress.   ?Musculoskeletal:     ?   General: No deformity.  ?   Cervical back: Normal range of motion.  ?   Comments: No muscle wasting noted to bilateral arms. The patient has equal strength in her bilateral upper extremities. Strong, equal grip strength. Sensation intact and equal bilaterally. Palpable radial pulses. Cap refill brisk.  ? ?  ?Skin: ?   General: Skin is warm and dry.  ?   Capillary Refill: Capillary refill takes less than 2 seconds.  ?Neurological:  ?   General: No focal deficit present.  ?   Mental Status: She is alert. Mental status is at baseline.  ?   Cranial Nerves: No cranial nerve deficit.  ?   Sensory: No sensory deficit.  ?   Motor: No weakness.  ? ? ?ED Results / Procedures / Treatments   ?Labs ?(all labs ordered are listed, but only abnormal results are displayed) ?Labs Reviewed - No data to display ? ?EKG ?None ? ?Radiology ?CT Cervical Spine Wo Contrast ? ?Result Date: 08/05/2021 ?CLINICAL DATA:  Cervical radiculopathy, no red flags EXAM: CT CERVICAL SPINE WITHOUT CONTRAST TECHNIQUE: Multidetector CT imaging of the cervical spine was performed without intravenous contrast. Multiplanar CT image reconstructions were also generated. RADIATION DOSE REDUCTION: This exam was performed according to the departmental dose-optimization program which includes automated exposure control, adjustment of the mA and/or kV according to patient size and/or use of iterative reconstruction technique. COMPARISON:  MRI 12/07/2020. FINDINGS: Alignment: Straightening of the normal cervical lordosis. No substantial sagittal subluxation. Skull base and vertebrae: C4-C6 ACDF. No definite fusion across the disc space. No evidence of hardware fracture or findings to suggest loosening. No evidence of acute fracture. Soft tissues and spinal canal: No prevertebral fluid or swelling. No visible canal hematoma. Disc levels: Multilevel facet and uncovertebral hypertrophy with potentially severe foraminal stenosis bilaterally at  C5-C6. Suspected at least moderate foraminal stenosis at other levels, including C3-C4 and C4-C5. Upper chest: Visualized lung apices are clear. IMPRESSION: 1. 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 Suspected at least moderate foraminal stenosis at other levels, including C3-C4 and C4-C5. 2. Canal stenosis is probably improved after C4-C6 ACDF, but evaluation of the canal is limited due to metallic artifact. 3. MRI could better evaluate the canal/cord/foramina if clinically warranted. Electronically Signed   By: Margaretha Sheffield M.D.   On: 08/05/2021 19:37   ? ?Procedures ?Procedures  ? ? ?Medications Ordered in ED ?Medications - No data to display ? ?ED Course/ Medical Decision Making/ A&P ?  ?                        ?Medical  Decision Making ?Amount and/or Complexity of Data Reviewed ?Radiology: ordered. ? ?Risk ?Prescription drug management. ? ? ?61 y/o F presents to the ED for evaluation of acute on chronic neck pain with "pins and needles" down both arms for the past week. Differential diagnosis includes but is not limited to radiculopathy, fracture, compression, MSK. Vital signs are unremarkable. Patient is afebrile. Physical exam shows a patient texting with bilateral hands without difficulty. No muscle wasting noted to bilateral arms. The patient has equal strength in her bilateral upper extremities. Strong, equal grip strength. Sensation intact and equal bilaterally. Palpable radial pulses. Cap refill brisk. Some paraspinal and midline tenderness which she reports is typical for her. I do not appreciate any step offs or deformities. No areas of overlying erythema or warmth. No overlying skin changes noted. Mildly limited ROM, probably secondary to hardware. The patient reports this is baseline for her. Negative Spurling's. ? ?Prior chart review shows that the patient had neck surgery in 11/2020.  ? ?Given her recent fall and pain, I  think a CT image is reasonable.  ? ?I independently reviewed the patient's imaging and agree with radiologist interpretation. The read is:  ? ? IMPRESSION:  ?1. Multilevel facet and uncovertebral hypertrophy with potentially severe

## 2021-08-16 ENCOUNTER — Emergency Department (HOSPITAL_COMMUNITY)
Admission: EM | Admit: 2021-08-16 | Discharge: 2021-08-17 | Disposition: A | Discharge status: Home / Self Care | Payer: Medicare Other | Attending: Emergency Medicine | Admitting: Emergency Medicine

## 2021-08-16 ENCOUNTER — Encounter (HOSPITAL_COMMUNITY): Payer: Self-pay | Admitting: Emergency Medicine

## 2021-08-16 DIAGNOSIS — M5412 Radiculopathy, cervical region: Secondary | ICD-10-CM | POA: Diagnosis not present

## 2021-08-16 DIAGNOSIS — M542 Cervicalgia: Secondary | ICD-10-CM | POA: Diagnosis present

## 2021-08-16 DIAGNOSIS — M25511 Pain in right shoulder: Secondary | ICD-10-CM | POA: Diagnosis not present

## 2021-08-16 NOTE — ED Triage Notes (Signed)
Pt states that she has been diagnosed with a pinched nerve in her R shoulder. Pain is radiating down her arm. States that she was seen last week for same and states that pain is worsening. States that she has spoken with neuro and they cannot get her in to see her sooner.  ?

## 2021-08-17 DIAGNOSIS — M5412 Radiculopathy, cervical region: Secondary | ICD-10-CM | POA: Diagnosis not present

## 2021-08-17 MED ORDER — PREDNISONE 20 MG PO TABS
60.0000 mg | ORAL_TABLET | Freq: Once | ORAL | Status: AC
  Administered 2021-08-17: 60 mg via ORAL
  Filled 2021-08-17: qty 3

## 2021-08-17 MED ORDER — OXYCODONE-ACETAMINOPHEN 5-325 MG PO TABS: 1.0000 | ORAL_TABLET | Freq: Four times a day (QID) | ORAL | 0 refills | Status: DC | PRN

## 2021-08-17 MED ORDER — OXYCODONE-ACETAMINOPHEN 5-325 MG PO TABS
2.0000 | ORAL_TABLET | Freq: Once | ORAL | Status: AC
  Administered 2021-08-17: 2 via ORAL
  Filled 2021-08-17: qty 2

## 2021-08-17 MED ORDER — PREDNISONE 20 MG PO TABS: ORAL_TABLET | ORAL | 0 refills | Status: DC

## 2021-08-17 NOTE — ED Provider Notes (Signed)
?[facility] DEPT ?Provider Note ? ? ?CSN: [id number] ?Arrival date & time: 08/16/21  2340 ? ?  ? ?History ? ?Chief Complaint  ?Patient presents with  ? Shoulder Pain  ?  R  ? ? ?Kristen [name] is a 61 y.o. female. ? ?Patient with history of central cord syndrome status decompression July 2022 by Dr. Reatha Armour, ongoing chronic right-sided shoulder and upper extremity pain --presents to the emergency department for evaluation of acute on chronic right-sided neck pain with pain that radiates into her right shoulder and down into her right arm.  Pain is similar to what she has experienced in the past.  She has associated intermittent paresthesias.  She has been prescribed hydrocodone, gabapentin which she has been taken.  She was seen in the emergency department on 08/05/2021 for similar symptoms.  She was given steroids at that time which seemed to help somewhat.  She is now out of pain medication and steroid medication.  She denies new injuries or falls.  She denies lower extremity pain or weakness, denies difficulty walking.  She reports having a follow-up appointment with her neurosurgeon on Monday, 08/20/2021.  No other red flag signs and symptoms of cord problems including fevers, incontinence.  She denies IV drug use. ? ? ?  ? ?Home Medications ?Prior to Admission medications   ?Medication Sig Start Date End Date Taking? Authorizing Provider  ?albuterol (VENTOLIN HFA) 108 (90 Base) MCG/ACT inhaler Inhale 1-2 puffs into the lungs every 6 (six) hours as needed for wheezing or shortness of breath. 11/21/20  Yes Dorie Rank, MD  ?atorvastatin (LIPITOR) 10 MG tablet Take 10 mg by mouth daily. 11/09/20  Yes [provider]  ?carvedilol (COREG) 25 MG tablet Take 1 tablet (25 mg total) by mouth 2 (two) times daily with a meal. 08/21/19  Yes Richarda Osmond, MD  ?citalopram (CELEXA) 20 MG tablet Take 20 mg by mouth daily. 08/01/21  Yes [provider]  ?diclofenac Sodium  (VOLTAREN) 1 % GEL Apply 1 application. topically daily as needed (for pain). 08/04/21  Yes [provider]  ?gabapentin (NEURONTIN) 300 MG capsule Take 2 capsules (600 mg total) by mouth 3 (three) times daily. 12/13/20  Yes Dawley, Troy C, DO  ?ibandronate (BONIVA) 150 MG tablet Take 150 mg by mouth every 30 (thirty) days. 11/12/20  Yes [provider]  ?losartan (COZAAR) 50 MG tablet TAKE 1 TABLET TWICE DAILY ?Patient taking differently: Take 50 mg by mouth daily. 02/23/20  Yes Richarda Osmond, MD  ?methocarbamol (ROBAXIN) 500 MG tablet Take 1 tablet (500 mg total) by mouth every 6 (six) hours as needed for muscle spasms. 12/13/20  Yes Dawley, Troy C, DO  ?oxyCODONE-acetaminophen (PERCOCET/ROXICET) 5-325 MG tablet Take 1 tablet by mouth every 6 (six) hours as needed for severe pain. 08/17/21  Yes Carlisle Cater, PA-C  ?predniSONE (DELTASONE) 20 MG tablet 3 Tabs PO Days 1-3, then 2 tabs PO Days 4-6, then 1 tab PO Day 7-9, then Half Tab PO Day 10-12 08/17/21  Yes Carlisle Cater, PA-C  ?Vitamin D, Ergocalciferol, (DRISDOL) 1.25 MG (50000 UNIT) CAPS capsule Take 50,000 Units by mouth once a week. Sunday 11/02/20  Yes [provider]  ?Grant Ruts INHUB 100-50 MCG/ACT AEPB Inhale 1 puff into the lungs daily as needed (for shortness of breath). 11/24/20  Yes [provider]  ?   ? ?Allergies    ?Amoxicillin and Penicillins   ? ?Review of Systems   ?Review of Systems ? ?Physical  Exam ?Updated Vital Signs ?BP (!) 144/91   Pulse (!) 56   Temp 98.2 ?F (36.8 ?C) (Oral)   Resp 15   Ht '5\' 3"'$  (1.6 m)   Wt 74.8 kg   SpO2 97%   BMI 29.23 kg/m?  ?Physical Exam ?Vitals and nursing note reviewed.  ?Constitutional:   ?   General: She is in acute distress (Appears mildly uncomfortable, worse with movement).  ?   Appearance: She is well-developed.  ?HENT:  ?   Head: Normocephalic and atraumatic.  ?Eyes:  ?   Conjunctiva/sclera: Conjunctivae normal.  ?Pulmonary:  ?   Effort: Pulmonary effort is normal.   ?Abdominal:  ?   Palpations: Abdomen is soft.  ?   Tenderness: There is no abdominal tenderness.  ?Musculoskeletal:     ?   General: Normal range of motion.  ?   Cervical back: Normal range of motion and neck supple. Spasms and tenderness present.  ?   Thoracic back: No spasms or tenderness.  ?   Lumbar back: No tenderness. Normal range of motion.  ?   Comments: No step-off noted with palpation of spine.  Patient with tenderness noted to the right cervical paraspinous musculature down into her posterior shoulder into the upper arm. ? ?Upper extremity myotomes tested bilaterally:  ?C5 Shoulder abduction 4+/5 right, 5/5 left ?C6 Elbow flexion/wrist extension 4+/5 right, 5/5 left ?C7 Elbow extension 4+/5 right, 5/5 left5/5 ?C8 Finger flexion 4+/5 right, 5/5 left ?T1 Finger abduction 5/5 ? ?Lower extremity myotomes tested bilaterally: ?L2 Hip flexion 5/5 ?L3 Knee extension 5/5 ?L4 Ankle dorsiflexion 5/5 ?S1 Ankle plantar flexion 5/5 ?  ?Skin: ?   General: Skin is warm and dry.  ?   Findings: No rash.  ?Neurological:  ?   Mental Status: She is alert.  ?   Sensory: No sensory deficit.  ?   Deep Tendon Reflexes: Reflexes are normal and symmetric.  ?   Comments: 5/5 strength in entire lower extremities bilaterally. No sensation deficit.   ? ? ?ED Results / Procedures / Treatments   ?Labs ?(all labs ordered are listed, but only abnormal results are displayed) ?Labs Reviewed - No data to display ? ?EKG ?None ? ?Radiology ?No results found. ? ?Procedures ?Procedures  ? ? ?Medications Ordered in ED ?Medications  ?oxyCODONE-acetaminophen (PERCOCET/ROXICET) 5-325 MG per tablet 2 tablet (2 tablets Oral Given 08/17/21 0126)  ?predniSONE (DELTASONE) tablet 60 mg (60 mg Oral Given 08/17/21 0126)  ? ? ?ED Course/ Medical Decision Making/ A&P ?  ?                        ?Medical Decision Making ?Risk ?Prescription drug management. ? ? ?Patient seen and examined. History obtained directly from patient.  Also reviewed previous  hospitalization notes, operative notes.  Reviewed previous ED visit.  She had CT imaging at that time. ? ?Imaging: Consider reimaging however recent CT performed and symptoms seem chronic without red flags. ? ?08/05/21 CT cervical spine ?IMPRESSION: ?1. 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 Suspected at least ?moderate foraminal stenosis at other levels, including C3-C4 and ?C4-C5. ?2. Canal stenosis is probably improved after C4-C6 ACDF, but ?evaluation of the canal is limited due to metallic artifact. ?3. MRI could better evaluate the canal/cord/foramina if clinically ?warranted. ? ?Medications/Fluids: Ordered: percocet, prednisone for symptom control.  ? ?Most recent vital signs reviewed and are as follows: ?BP (!) 144/91   Pulse Marland Kitchen)  56   Temp 97.8 ?F (36.6 ?C) (Oral)   Resp 15   Ht '5\' 3"'$  (1.6 m)   Wt 74.8 kg   SpO2 97%   BMI 29.23 kg/m?  ? ?Initial impression: Right-sided cervical radiculopathy, low concern for central cord issue tonight ? ?2:46 AM Reassessment performed. Patient appears sleepy but more comfortable. ? ?Plan: Discharge to home.  ? ?Prescriptions written for: Percocet 5/325 x 5 tablets, prednisone taper ? ?ED return instructions discussed: Urged to return with worsening severe pain, loss of bowel or bladder control, trouble walking.  ? ?Follow-up instructions discussed: Patient encouraged to follow-up with their neurosurgeon as planned next week.  ? ? ? ? ? ? ? ? ? ?Final Clinical Impression(s) / ED Diagnoses ?Final diagnoses:  ?Cervical radiculopathy  ? ?Patient with neck, shoulder, and arm pain.  Known history of cervical radiculopathy, status post surgery July 2023, recent CT with chronic changes.  No new neurological deficits suspected tonight.  Trace right upper extremity weakness, likely exacerbated by pain.  Patient is ambulatory without difficulty. No warning symptoms of neck/back pain including: fecal  incontinence, urinary retention or overflow incontinence, night sweats, waking from sleep with back pain, unexplained fevers or weight loss, h/o cancer, IVDU, recent trauma. No concern for cauda equina, epidural abscess, central cord syndrome

## 2021-08-17 NOTE — ED Notes (Signed)
Discharge instructions including follow up care and pain management dicussed with pt. Pt verbalized understanding with no questions at this time.  ?

## 2021-08-17 NOTE — Discharge Instructions (Signed)
Please read and follow all provided instructions. ? ?Your diagnoses today include:  ?1. Cervical radiculopathy   ? ?Tests performed today include: ?Vital signs - see below for your results today ? ?Medications prescribed:  ?Prednisone - steroid medicine  ? ?It is best to take this medication in the morning to prevent sleeping problems. If you are diabetic, monitor your blood sugar closely and stop taking Prednisone if blood sugar is over 300. Take with food to prevent stomach upset.  ? ?Percocet (oxycodone/acetaminophen) - narcotic pain medication ? ?DO NOT drive or perform any activities that require you to be awake and alert because this medicine can make you drowsy. BE VERY CAREFUL not to take multiple medicines containing Tylenol (also called acetaminophen). Doing so can lead to an overdose which can damage your liver and cause liver failure and possibly death. ? ?Take any prescribed medications only as directed. ? ?Home care instructions:  ?Follow any educational materials contained in this packet ?Please rest, use ice or heat on your back for the next several days ?Do not lift, push, pull anything more than 10 pounds for the next week ? ?Follow-up instructions: ?Please follow-up with your neurosurgeon on Monday as planned. ? ?Return instructions:  ?SEEK IMMEDIATE MEDICAL ATTENTION IF YOU HAVE: ?New numbness, tingling, weakness, or problem with the use of your arms or legs ?Severe back pain not relieved with medications ?Loss control of your bowels or bladder ?Increasing pain in any areas of the body (such as chest or abdominal pain) ?Shortness of breath, dizziness, or fainting.  ?Worsening nausea (feeling sick to your stomach), vomiting, fever, or sweats ?Any other emergent concerns regarding your health  ? ?Additional Information: ? ?Your vital signs today were: ?BP (!) 144/91   Pulse (!) 56   Temp 98.2 ?F (36.8 ?C) (Oral)   Resp 15   Ht '5\' 3"'$  (1.6 m)   Wt 74.8 kg   SpO2 97%   BMI 29.23 kg/m?  ?If your  blood pressure (BP) was elevated above 135/85 this visit, please have this repeated by your doctor within one month. ?-------------- ? ?

## 2022-01-16 ENCOUNTER — Other Ambulatory Visit: Payer: Self-pay | Admitting: Adult Medicine

## 2022-01-16 DIAGNOSIS — Z1231 Encounter for screening mammogram for malignant neoplasm of breast: Secondary | ICD-10-CM

## 2022-01-22 ENCOUNTER — Ambulatory Visit
Admission: RE | Admit: 2022-01-22 | Discharge: 2022-01-22 | Disposition: A | Discharge status: Home / Self Care | Payer: Medicare Other | Source: Ambulatory Visit | Attending: Family Medicine | Admitting: Family Medicine

## 2022-01-22 ENCOUNTER — Other Ambulatory Visit: Payer: Self-pay | Admitting: Family Medicine

## 2022-01-22 DIAGNOSIS — R0789 Other chest pain: Secondary | ICD-10-CM

## 2022-02-05 ENCOUNTER — Ambulatory Visit
Admission: RE | Admit: 2022-02-05 | Discharge: 2022-02-05 | Disposition: A | Discharge status: Home / Self Care | Payer: Medicare Other | Source: Ambulatory Visit | Attending: Adult Medicine | Admitting: Adult Medicine

## 2022-02-05 DIAGNOSIS — Z1231 Encounter for screening mammogram for malignant neoplasm of breast: Secondary | ICD-10-CM

## 2022-06-04 ENCOUNTER — Other Ambulatory Visit: Payer: Self-pay | Admitting: Family Medicine

## 2022-06-04 DIAGNOSIS — R319 Hematuria, unspecified: Secondary | ICD-10-CM

## 2022-06-04 DIAGNOSIS — R109 Unspecified abdominal pain: Secondary | ICD-10-CM

## 2022-06-06 ENCOUNTER — Ambulatory Visit
Admission: RE | Admit: 2022-06-06 | Discharge: 2022-06-06 | Disposition: A | Discharge status: Home / Self Care | Payer: 59 | Source: Ambulatory Visit | Attending: Family Medicine | Admitting: Family Medicine

## 2022-06-06 DIAGNOSIS — R109 Unspecified abdominal pain: Secondary | ICD-10-CM

## 2022-06-06 DIAGNOSIS — R319 Hematuria, unspecified: Secondary | ICD-10-CM

## 2022-06-27 ENCOUNTER — Other Ambulatory Visit: Payer: Medicare Other

## 2022-08-08 ENCOUNTER — Encounter: Payer: Self-pay | Admitting: Physician Assistant

## 2022-08-08 ENCOUNTER — Ambulatory Visit (INDEPENDENT_AMBULATORY_CARE_PROVIDER_SITE_OTHER): Payer: 59 | Admitting: Physician Assistant

## 2022-08-08 ENCOUNTER — Telehealth: Payer: Self-pay | Admitting: Physician Assistant

## 2022-08-08 VITALS — BP 138/90 | HR 78 | Ht 63.0 in | Wt 147.1 lb

## 2022-08-08 DIAGNOSIS — Z86010 Personal history of colonic polyps: Secondary | ICD-10-CM

## 2022-08-08 DIAGNOSIS — M25551 Pain in right hip: Secondary | ICD-10-CM

## 2022-08-08 MED ORDER — NA SULFATE-K SULFATE-MG SULF 17.5-3.13-1.6 GM/177ML PO SOLN: 1.0000 | Freq: Once | ORAL | 0 refills | Status: AC

## 2022-08-08 NOTE — Telephone Encounter (Signed)
Kristen [name] said that they dont have suprep at all. We  need to change the prep

## 2022-08-08 NOTE — Telephone Encounter (Signed)
Inbound call from pharmacy wanted to let us know that medication prescribe today is not availed at the moment .Please call them for Further information

## 2022-08-08 NOTE — Telephone Encounter (Signed)
Patient said she will come tomorrow at 3 to do instructions

## 2022-08-08 NOTE — Patient Instructions (Addendum)
_______________________________________________________  If your blood pressure at your visit was 140/90 or greater, please contact your primary care physician to follow up on this.  _______________________________________________________  If you are age 61 or older, your body mass index should be between 23-30. Your Body mass index is 26.06 kg/m. If this is out of the aforementioned range listed, please consider follow up with your Primary Care Provider.  If you are age 74 or younger, your body mass index should be between 19-25. Your Body mass index is 26.06 kg/m. If this is out of the aformentioned range listed, please consider follow up with your Primary Care Provider.   ________________________________________________________  The  GI providers would like to encourage you to use [facility] to communicate with providers for non-urgent requests or questions.  Due to long hold times on the telephone, sending your provider a message by [facility] may be a faster and more efficient way to get a response.  Please allow 48 business hours for a response.  Please remember that this is for non-urgent requests.  _______________________________________________________  Dennis Bast have been scheduled for a colonoscopy. Please follow written instructions given to you at your visit today.  Please pick up your prep supplies at the pharmacy within the next 1-3 days. If you use inhalers (even only as needed), please bring them with you on the day of your procedure.  We have sent the following medications to your pharmacy for you to pick up at your convenience: Suprep  Please call Dr Flonnie Overman and ask about your right hip pain  Please call with any questions or concerns.  It was a pleasure to see you today!  Thank you for trusting me with your gastrointestinal care!

## 2022-08-08 NOTE — Telephone Encounter (Signed)
LVM for patient to call back  Patient can have plenvu and she will need new instructions and I have all of this for her to come and pick up and we can go over her new instructions

## 2022-08-08 NOTE — Progress Notes (Signed)
Chief Complaint: Right lower quadrant abdominal pain  HPI:    Kristen [name] is a 62 year old African-American female with a past medical history as listed below including anxiety, depression, obesity and multiple others, who was referred to me by Loura Pardon, MD for a complaint of right lower quadrant abdominal pain.    12/2015 colonoscopy with 1 small adenomatous and 1 small hyperplastic polyp with hepatic flexure.  Repeat recommended in August 2024.    07/11/2017 right upper quadrant ultrasound with cholelithiasis.    10/15/2018 CTAP with a small stone or polyp in the gallbladder, prominent pancreatic duct without discrete mass or inflammatory change and diverticular disease of the sigmoid.    02/26/2019 office visit with Dr. Loletha Carrow for reflux, nausea and vomiting.  At that visit stopped Famotidine and started Omeprazole 40 mg daily, told to stop Ibuprofen and all NSAIDs.  EGD the next week as scheduled.  Also discussed possible surgical referral for gallstone.    03/03/2019 EGD for epigastric abdominal pain and weight loss with normal esophagus, erythematous mucosa in the antrum and otherwise normal duodenum.  Apsey showed mildly active chronic H. pylori gastritis.  She was started on quadruple therapy with Metronidazole, Bismuth subsalicylate, Doxycycline and Omeprazole.  Recommended to follow-up urea breath test 2 weeks after completing therapy.  At that point discussed if she continues with symptoms recommend a referral to [facility] for gallbladder removal surgery.    03/16/2020 lap cholecystectomy.    08/16/2021 patient returned to the ER for the second time for neck pain which radiated into her shoulder.  Recent history of central cord syndrome status post decompression in July 2022.  At that time discussed known history of cervical radiculopathy status post surgery in July 2023 and recent CT with chronic changes.  She was given Oxycodone and Prednisone.    06/06/2022 CTAP without contrast for  abdominal pain, hematuria with no acute abnormality, sigmoid colonic diverticulosis without findings of acute diverticulitis.  Also multilevel degenerative changes in the spine with notable T11-T12 and L5-S1 discogenic disease and mild degenerative spurring of the bilateral hips.    Today, patient presents to clinic and tells me that she has pain down her right lower abdomen, it has been there every day for the past 3 years and is all day long, worse when she is walking and makes her stop in her tracks due to some shooting pain from this area as well as when she is trying to sleep, if she rolls on that side she gets worse pain.  Tells me no pain medications have helped.  She does take a sleeping pill to help her sleep at night.  Denies any GI symptoms at all.  Having normal bowel movements, no upper GI symptoms at all, no blood in her stool.    Denies fever, chills, weight loss.  Past Medical History:  Diagnosis Date   Allergy    Anxiety    Arthritis    Asthma    Back pain    Seen by Neurosurery   Depression    Headache    Hyperlipidemia    Hypertension    Leg pain    Bil   Macromastia    Obesity    Peripheral neuropathy    Post-menopausal    Pre-diabetes    Sleep apnea    no cpap    Past Surgical History:  Procedure Laterality Date   ANTERIOR CERVICAL DECOMP/DISCECTOMY FUSION N/A 12/12/2020   Procedure: Anterior Cervical Decompression Fusion Cervical four-five, Cervical five-six;  Surgeon: Karsten Ro, DO;  Location: [place];  Service: Neurosurgery;  Laterality: N/A;   BACK SURGERY     BREAST REDUCTION SURGERY Bilateral 02/13/2018   Procedure: MAMMARY REDUCTION  (BREAST);  Surgeon: Irene Limbo, MD;  Location: [place];  Service: Plastics;  Laterality: Bilateral;   BREAST SURGERY     carpel tunnel release Bilateral    CHOLECYSTECTOMY N/A 03/16/2020   Procedure: LAPAROSCOPIC CHOLECYSTECTOMY WITH INTRAOPERATIVE CHOLANGIOGRAM;  Surgeon: Donnie Mesa, MD;   Location: [place];  Service: General;  Laterality: N/A;   COLONOSCOPY     LAMINECTOMY  01/20/2008   L4-L5 by Dr Latanya Maudlin (Ortho), [facility]   POLYPECTOMY     REDUCTION MAMMAPLASTY Bilateral 01/2018   TUBAL LIGATION      Current Outpatient Medications  Medication Sig Dispense Refill   albuterol (VENTOLIN HFA) 108 (90 Base) MCG/ACT inhaler Inhale 1-2 puffs into the lungs every 6 (six) hours as needed for wheezing or shortness of breath. 1 each 3   atorvastatin (LIPITOR) 10 MG tablet Take 10 mg by mouth daily.     carvedilol (COREG) 25 MG tablet Take 1 tablet (25 mg total) by mouth 2 (two) times daily with a meal. 180 tablet 3   citalopram (CELEXA) 20 MG tablet Take 20 mg by mouth daily.     diclofenac Sodium (VOLTAREN) 1 % GEL Apply 1 application. topically daily as needed (for pain).     gabapentin (NEURONTIN) 300 MG capsule Take 2 capsules (600 mg total) by mouth 3 (three) times daily. 120 capsule 2   ibandronate (BONIVA) 150 MG tablet Take 150 mg by mouth every 30 (thirty) days.     losartan (COZAAR) 50 MG tablet TAKE 1 TABLET TWICE DAILY (Patient taking differently: Take 50 mg by mouth daily.) 180 tablet 1   methocarbamol (ROBAXIN) 500 MG tablet Take 1 tablet (500 mg total) by mouth every 6 (six) hours as needed for muscle spasms. 120 tablet 1   oxyCODONE-acetaminophen (PERCOCET/ROXICET) 5-325 MG tablet Take 1 tablet by mouth every 6 (six) hours as needed for severe pain. 5 tablet 0   predniSONE (DELTASONE) 20 MG tablet 3 Tabs PO Days 1-3, then 2 tabs PO Days 4-6, then 1 tab PO Day 7-9, then Half Tab PO Day 10-12 20 tablet 0   Vitamin D, Ergocalciferol, (DRISDOL) 1.25 MG (50000 UNIT) CAPS capsule Take 50,000 Units by mouth once a week. Sunday     WIXELA INHUB 100-50 MCG/ACT AEPB Inhale 1 puff into the lungs daily as needed (for shortness of breath).     No current facility-administered medications for this visit.    Allergies as of 08/08/2022 - Review Complete 08/17/2021   Allergen Reaction Noted   Amoxicillin Hives 10/17/2005   Penicillins Hives, Itching, and Rash 06/28/2011    Family History  Problem Relation Age of Onset   Lupus Mother    Heart disease Mother    Anemia Father    Hypertension Father    Arthritis Brother    Asthma Son    Lupus Daughter    Colon cancer Neg Hx    Colon polyps Neg Hx    Esophageal cancer Neg Hx    Rectal cancer Neg Hx    Stomach cancer Neg Hx    Breast cancer Neg Hx     Social History   Socioeconomic History   Marital status: Single    Spouse name: Not on file   Number of children: 3   Years of education:  13   Highest education level: Some college, no degree  Occupational History   Occupation: Disabled  Tobacco Use   Smoking status: Former    Packs/day: 0.25    Years: 0.50    Additional pack years: 0.00    Total pack years: 0.13    Types: Cigarettes    Quit date: 12/19/1990    Years since quitting: 31.6   Smokeless tobacco: Never   Tobacco comments:    Quit 25 years ago.  Vaping Use   Vaping Use: Never used  Substance and Sexual Activity   Alcohol use: No    Alcohol/week: 0.0 standard drinks of alcohol    Comment: 05/29/16 none   Drug use: Yes    Frequency: 21.0 times per week    Types: Marijuana   Sexual activity: Yes    Birth control/protection: Post-menopausal  Other Topics Concern   Not on file  Social History Narrative   Lives at home with her daughter, grandkids (3) and great-grandson.   House is one-level, has 2 steps to get in, no hand rail. No throw rugs on the floor. No grab bars in bathroom. Has smoke alarms.   Has one dog.   Likes seafood. Eats salads and fruits. Likes to drink juices.   Occasional use of caffeine.   No real hobbies, likes to watch TV.   Social Determinants of Health   Financial Resource Strain: Medium Risk (04/14/2018)   Overall Financial Resource Strain (CARDIA)    Difficulty of Paying Living Expenses: Somewhat hard  Food Insecurity: No Food Insecurity  (04/14/2018)   Hunger Vital Sign    Worried About Running Out of Food in the Last Year: Never true    Ran Out of Food in the Last Year: Never true  Transportation Needs: No Transportation Needs (04/14/2018)   PRAPARE - Hydrologist (Medical): No    Lack of Transportation (Non-Medical): No  Physical Activity: Insufficiently Active (04/14/2018)   Exercise Vital Sign    Days of Exercise per Week: 3 days    Minutes of Exercise per Session: 30 min  Stress: No Stress Concern Present (04/14/2018)   [place]    Feeling of Stress : Only a little  Social Connections: Moderately Isolated (04/14/2018)   Social Connection and Isolation Panel [NHANES]    Frequency of Communication with Friends and Family: More than three times a week    Frequency of Social Gatherings with Friends and Family: More than three times a week    Attends Religious Services: Never    Marine scientist or Organizations: No    Attends Archivist Meetings: Never    Marital Status: Divorced  Human resources officer Violence: Not At Risk (04/14/2018)   Humiliation, Afraid, Rape, and Kick questionnaire    Fear of Current or Ex-Partner: No    Emotionally Abused: No    Physically Abused: No    Sexually Abused: No    Review of Systems:    Constitutional: No weight loss, fever or chills Skin: No rash Cardiovascular: No chest pain Respiratory: No SOB  Gastrointestinal: See HPI and otherwise negative Genitourinary: No dysuria  Neurological: No headache, dizziness or syncope Musculoskeletal: No new muscle or joint pain Hematologic: No bleeding  Psychiatric: No history of depression or anxiety   Physical Exam:  Vital signs: BP (!) 142/90 (BP Location: Left Arm, Patient Position: Sitting, Cuff Size: Normal)   Pulse 78   Ht  5\' 3"  (1.6 m)   Wt 147 lb 2 oz (66.7 kg)   SpO2 99%   BMI 26.06 kg/m    Constitutional:    Pleasant AA female appears to be in NAD, Well developed, Well nourished, alert and cooperative Head:  Normocephalic and atraumatic. Eyes:   PEERL, EOMI. No icterus. Conjunctiva pink. Ears:  Normal auditory acuity. Neck:  Supple Throat: Oral cavity and pharynx without inflammation, swelling or lesion.  Respiratory: Respirations even and unlabored. Lungs clear to auscultation bilaterally.   No wheezes, crackles, or rhonchi.  Cardiovascular: Normal S1, S2. No MRG. Regular rate and rhythm. No peripheral edema, cyanosis or pallor.  Gastrointestinal:  Soft, nondistended, nontender. No rebound or guarding. Normal bowel sounds. No appreciable masses or hepatomegaly. Rectal:  Not performed.  Msk:  Symmetrical without gross deformities. Without edema, no deformity or joint abnormality. + Pain over right hip joint and into her back Neurologic:  Alert and  oriented x4;  grossly normal neurologically.  Skin:   Dry and intact without significant lesions or rashes. Psychiatric: Demonstrates good judgement and reason without abnormal affect or behaviors.  See HPI for recent imaging.  Assessment: 1.  History of adenomatous polyp: Colonoscopy due in August of this year for surveillance 2.  Right hip pain: Degenerative spurring seen of the hips on recent CT, this is likely the cause of her pain given worsened pain with walking and pressure on that side  Plan: 1.  Went ahead and scheduled patient for surveillance colonoscopy in August given history of adenomatous polyp.  This is scheduled with Dr. Loletha Carrow in the [facility].  Did provide the patient a detailed list risks for the procedure and she agrees to proceed. 2.  Discussed chronic right-sided pain.  This is most likely from her degenerative hip disease.  Recommend that she call her orthopedic doctor to schedule follow-up with them and consult about this further. 3.  Patient to follow in clinic per recommendations from Dr. Loletha Carrow after time of procedure.  Ellouise Newer, PA-C [facility] 08/08/2022, 11:18 AM  Cc: Loura Pardon, MD

## 2022-08-13 ENCOUNTER — Other Ambulatory Visit: Payer: Self-pay | Admitting: Surgery

## 2022-08-13 DIAGNOSIS — M47816 Spondylosis without myelopathy or radiculopathy, lumbar region: Secondary | ICD-10-CM

## 2022-08-13 NOTE — Progress Notes (Signed)
____________________________________________________________  Attending physician addendum:  Thank you for sending this case to me. I have reviewed the entire note and agree with the plan.  The pain does sounds musculoskeletal.  Wilfrid Lund, MD  ____________________________________________________________

## 2022-09-02 NOTE — Telephone Encounter (Signed)
LVM for patient to call back and make a previst for 60 days or less from her procedure due to the new policy. She can do plenvu prep and we can give her a sample

## 2022-09-03 ENCOUNTER — Ambulatory Visit
Admission: RE | Admit: 2022-09-03 | Discharge: 2022-09-03 | Disposition: A | Discharge status: Home / Self Care | Payer: 59 | Source: Ambulatory Visit | Attending: Surgery | Admitting: Surgery

## 2022-09-03 DIAGNOSIS — M47816 Spondylosis without myelopathy or radiculopathy, lumbar region: Secondary | ICD-10-CM

## 2022-09-04 NOTE — Telephone Encounter (Signed)
Made 7-29

## 2022-12-16 ENCOUNTER — Ambulatory Visit (AMBULATORY_SURGERY_CENTER): Payer: 59

## 2022-12-16 VITALS — Ht 63.0 in | Wt 143.0 lb

## 2022-12-16 DIAGNOSIS — M25551 Pain in right hip: Secondary | ICD-10-CM

## 2022-12-16 DIAGNOSIS — Z86010 Personal history of colonic polyps: Secondary | ICD-10-CM

## 2022-12-16 NOTE — Progress Notes (Signed)
No egg or soy allergy known to patient  No issues known to pt with past sedation with any surgeries or procedures Patient denies ever being told they had issues or difficulty with intubation  No FH of Malignant Hyperthermia Pt is not on diet pills Pt is not on  home 02  Pt is not on blood thinners  Pt denies issues with constipation  No A fib or A flutter Have any cardiac testing pending--no  LOA: independent  Prep: Plenvu sampe given at OV 08/08/22  Patient's chart reviewed by Cathlyn Parsons CNRA prior to previsit and patient appropriate for the [facility].  Previsit completed and red dot placed by patient's name on their procedure day (on provider's schedule).     PV competed with patient. Prep instructions sent to home address. Pt advised to pick up prep instructions from office due to her procedure day being close.

## 2022-12-23 ENCOUNTER — Telehealth: Payer: Self-pay | Admitting: Physician Assistant

## 2022-12-23 DIAGNOSIS — Z86010 Personal history of colonic polyps: Secondary | ICD-10-CM

## 2022-12-23 MED ORDER — PLENVU 140 G PO SOLR
1.0000 | Freq: Once | ORAL | 0 refills | Status: AC
Start: 2022-12-23 — End: 2022-12-23

## 2022-12-23 NOTE — Telephone Encounter (Signed)
Spoke with pt.  She was given a Plenvu sample at her OV.  She states "I can't find it in my house."  Plenvu RX sent to [facility]

## 2022-12-23 NOTE — Telephone Encounter (Signed)
PT needs to have prep medication sent to [facility] on [address]

## 2022-12-24 ENCOUNTER — Encounter: Payer: 59 | Admitting: Gastroenterology

## 2023-01-13 ENCOUNTER — Other Ambulatory Visit: Payer: Self-pay | Admitting: Family Medicine

## 2023-01-13 DIAGNOSIS — Z1231 Encounter for screening mammogram for malignant neoplasm of breast: Secondary | ICD-10-CM

## 2023-01-22 ENCOUNTER — Telehealth: Payer: Self-pay | Admitting: Physician Assistant

## 2023-01-22 ENCOUNTER — Other Ambulatory Visit: Payer: Self-pay

## 2023-01-22 DIAGNOSIS — Z86010 Personal history of colonic polyps: Secondary | ICD-10-CM

## 2023-01-22 MED ORDER — NA SULFATE-K SULFATE-MG SULF 17.5-3.13-1.6 GM/177ML PO SOLN
1.0000 | Freq: Once | ORAL | 0 refills | Status: AC
Start: 2023-01-22 — End: 2023-01-22

## 2023-01-22 NOTE — Telephone Encounter (Signed)
New rx sent pt made aware

## 2023-01-22 NOTE — Telephone Encounter (Signed)
PT is calling to have prep medication resent to [facility] on [address]. Please advise.

## 2023-02-06 ENCOUNTER — Encounter: Payer: Self-pay | Admitting: Gastroenterology

## 2023-02-06 ENCOUNTER — Ambulatory Visit (AMBULATORY_SURGERY_CENTER): Payer: 59 | Admitting: Gastroenterology

## 2023-02-06 VITALS — BP 167/94 | HR 79 | Temp 97.2°F | Resp 17 | Ht 63.0 in | Wt 143.0 lb

## 2023-02-06 DIAGNOSIS — Z86010 Personal history of colonic polyps: Secondary | ICD-10-CM | POA: Diagnosis not present

## 2023-02-06 DIAGNOSIS — D125 Benign neoplasm of sigmoid colon: Secondary | ICD-10-CM

## 2023-02-06 DIAGNOSIS — Z09 Encounter for follow-up examination after completed treatment for conditions other than malignant neoplasm: Secondary | ICD-10-CM | POA: Diagnosis not present

## 2023-02-06 DIAGNOSIS — D122 Benign neoplasm of ascending colon: Secondary | ICD-10-CM | POA: Diagnosis not present

## 2023-02-06 MED ORDER — SODIUM CHLORIDE 0.9 % IV SOLN: 500.0000 mL | Freq: Once | INTRAVENOUS | Status: DC

## 2023-02-06 NOTE — Progress Notes (Signed)
A/O x 3, gd SR's, VSS, report to RN

## 2023-02-06 NOTE — Patient Instructions (Addendum)
Patient has a contact number available for                            emergencies. The signs and symptoms of potential                            delayed complications were discussed with the                            patient. Return to normal activities tomorrow.                            Written discharge instructions were provided to the                            patient.                           - Resume previous diet.                           - Continue present medications.                           - Await pathology results.                           - Repeat colonoscopy is recommended for                            surveillance. The colonoscopy date will be                            determined after pathology results from today's                            exam become available for review.  Handout on polyps and diverticulitis given.    YOU HAD AN ENDOSCOPIC PROCEDURE TODAY AT THE [facility]:   Refer to the procedure report that was given to you for any specific questions about what was found during the examination.  If the procedure report does not answer your questions, please call your gastroenterologist to clarify.  If you requested that your care partner not be given the details of your procedure findings, then the procedure report has been included in a sealed envelope for you to review at your convenience later.  YOU SHOULD EXPECT: Some feelings of bloating in the abdomen. Passage of more gas than usual.  Walking can help get rid of the air that was put into your GI tract during the procedure and reduce the bloating. If you had a lower endoscopy (such as a colonoscopy or flexible sigmoidoscopy) you may notice spotting of blood in your stool or on the toilet paper. If you underwent a bowel prep for your procedure, you may not have a normal bowel movement for a few days.  Please Note:  You might notice some irritation and congestion in your nose or some drainage.   This is from the oxygen used during your  procedure.  There is no need for concern and it should clear up in a day or so.  SYMPTOMS TO REPORT IMMEDIATELY:  Following lower endoscopy (colonoscopy or flexible sigmoidoscopy):  Excessive amounts of blood in the stool  Significant tenderness or worsening of abdominal pains  Swelling of the abdomen that is new, acute  Fever of 100F or higher   For urgent or emergent issues, a gastroenterologist can be reached at any hour by calling [phone]. Do not use MyChart messaging for urgent concerns.    DIET:  We do recommend a small meal at first, but then you may proceed to your regular diet.  Drink plenty of fluids but you should avoid alcoholic beverages for 24 hours.  ACTIVITY:  You should plan to take it easy for the rest of today and you should NOT DRIVE or use heavy machinery until tomorrow (because of the sedation medicines used during the test).    FOLLOW UP: Our staff will call the number listed on your records the next business day following your procedure.  We will call around 7:15- 8:00 am to check on you and address any questions or concerns that you may have regarding the information given to you following your procedure. If we do not reach you, we will leave a message.     If any biopsies were taken you will be contacted by phone or by letter within the next 1-3 weeks.  Please call us at [phone] if you have not heard about the biopsies in 3 weeks.    SIGNATURES/CONFIDENTIALITY: You and/or your care partner have signed paperwork which will be entered into your electronic medical record.  These signatures attest to the fact that that the information above on your After Visit Summary has been reviewed and is understood.  Full responsibility of the confidentiality of this discharge information lies with you and/or your care-partner.

## 2023-02-06 NOTE — Progress Notes (Signed)
Pt's states no medical or surgical changes since previsit or office visit. 

## 2023-02-06 NOTE — Progress Notes (Signed)
History and Physical:  This patient presents for endoscopic testing for: Encounter Diagnosis  Name Primary?   History of adenomatous polyp of colon Yes    Surveillance colonoscopy - Hx colon polyps Diminutive TA August 2017 Last seen in office March 2024 for pain felt to be musculoskeletal  Patient is otherwise without complaints or active issues today.   Past Medical History: Past Medical History:  Diagnosis Date   Allergy    Anxiety    Arthritis    Asthma    Back pain    Seen by Neurosurery   Depression    Headache    Hyperlipidemia    Hypertension    Leg pain    Bil   Macromastia    Obesity    Peripheral neuropathy    Post-menopausal    Pre-diabetes    Sleep apnea    no cpap     Past Surgical History: Past Surgical History:  Procedure Laterality Date   ANTERIOR CERVICAL DECOMP/DISCECTOMY FUSION N/A 12/12/2020   Procedure: Anterior Cervical Decompression Fusion Cervical four-five, Cervical five-six;  Surgeon: Bethann Goo, DO;  Location: [facility];  Service: Neurosurgery;  Laterality: N/A;   BACK SURGERY     BREAST REDUCTION SURGERY Bilateral 02/13/2018   Procedure: MAMMARY REDUCTION  (BREAST);  Surgeon: Glenna Fellows, MD;  Location: [facility];  Service: Plastics;  Laterality: Bilateral;   BREAST SURGERY     carpel tunnel release Bilateral    CHOLECYSTECTOMY N/A 03/16/2020   Procedure: LAPAROSCOPIC CHOLECYSTECTOMY WITH INTRAOPERATIVE CHOLANGIOGRAM;  Surgeon: Manus Rudd, MD;  Location: [facility];  Service: General;  Laterality: N/A;   COLONOSCOPY     LAMINECTOMY  01/20/2008   L4-L5 by Dr Ranee Gosselin (Ortho), [facility]   POLYPECTOMY     REDUCTION MAMMAPLASTY Bilateral 01/2018   TUBAL LIGATION      Allergies: Allergies  Allergen Reactions   Amoxicillin Hives   Penicillins Hives, Itching and Rash    Has patient had a PCN reaction causing immediate rash, facial/tongue/throat swelling, SOB or lightheadedness with hypotension:  Yes Has patient had a PCN reaction causing severe rash involving mucus membranes or skin necrosis: No Has patient had a PCN reaction that required hospitalization: No Has patient had a PCN reaction occurring within the last 10 years: No If all of the above answers are "NO", then may proceed with Cephalosporin use.     Outpatient Meds: Current Outpatient Medications  Medication Sig Dispense Refill   atorvastatin (LIPITOR) 20 MG tablet Take 20 mg by mouth every morning.     baclofen (LIORESAL) 10 MG tablet Take 1 tablet by mouth 3 (three) times daily as needed for muscle spasms.     carvedilol (COREG) 25 MG tablet Take 1 tablet (25 mg total) by mouth 2 (two) times daily with a meal. 180 tablet 3   diazepam (VALIUM) 5 MG tablet Take 5 mg by mouth.     DULoxetine (CYMBALTA) 30 MG capsule Take 30 mg by mouth every morning.     gabapentin (NEURONTIN) 300 MG capsule Take 2 capsules (600 mg total) by mouth 3 (three) times daily. 120 capsule 2   levocetirizine (XYZAL) 5 MG tablet Take 5 mg by mouth daily.     losartan (COZAAR) 50 MG tablet TAKE 1 TABLET TWICE DAILY (Patient taking differently: Take 50 mg by mouth daily.) 180 tablet 1   montelukast (SINGULAIR) 10 MG tablet Take 10 mg by mouth daily.     olmesartan (BENICAR) 40 MG tablet Take  40 mg by mouth daily.     Vitamin D, Ergocalciferol, (DRISDOL) 1.25 MG (50000 UNIT) CAPS capsule Take 50,000 Units by mouth once a week.     albuterol (VENTOLIN HFA) 108 (90 Base) MCG/ACT inhaler Inhale 1-2 puffs into the lungs every 6 (six) hours as needed for wheezing or shortness of breath. 1 each 3   citalopram (CELEXA) 20 MG tablet Take 20 mg by mouth daily. (Patient not taking: Reported on 02/06/2023)     diclofenac Sodium (VOLTAREN) 1 % GEL Apply 1 application. topically daily as needed (for pain). (Patient not taking: Reported on 12/16/2022)     fluticasone (FLONASE) 50 MCG/ACT nasal spray Place 1 spray into both nostrils daily.     ibandronate (BONIVA) 150  MG tablet Take 150 mg by mouth every 30 (thirty) days.     methocarbamol (ROBAXIN) 500 MG tablet Take 1 tablet (500 mg total) by mouth every 6 (six) hours as needed for muscle spasms. (Patient not taking: Reported on 02/06/2023) 120 tablet 1   Current Facility-Administered Medications  Medication Dose Route Frequency Provider Last Rate Last Admin   0.9 %  sodium chloride infusion  500 mL Intravenous Once Charlie Pitter III, MD          ___________________________________________________________________ Objective   Exam:  BP (!) 152/82   Pulse 68   Temp (!) 97.2 F (36.2 C)   Ht 5\' 3"  (1.6 m)   Wt 143 lb (64.9 kg)   SpO2 97%   BMI 25.33 kg/m   CV: regular , S1/S2 Resp: clear to auscultation bilaterally, normal RR and effort noted GI: soft, no tenderness, with active bowel sounds.   Assessment: Encounter Diagnosis  Name Primary?   History of adenomatous polyp of colon Yes     Plan: Colonoscopy   The benefits and risks of the planned procedure were described in detail with the patient or (when appropriate) their health care proxy.  Risks were outlined as including, but not limited to, bleeding, infection, perforation, adverse medication reaction leading to cardiac or pulmonary decompensation, pancreatitis (if ERCP).  The limitation of incomplete mucosal visualization was also discussed.  No guarantees or warranties were given.  The patient is appropriate for an endoscopic procedure in the ambulatory setting.   - Kristen Jupiter, MD

## 2023-02-06 NOTE — Progress Notes (Signed)
Called to room to assist during endoscopic procedure.  Patient ID and intended procedure confirmed with present staff. Received instructions for my participation in the procedure from the performing physician.  

## 2023-02-06 NOTE — Op Note (Signed)
[facility] Patient Name: Kristen [name] Procedure Date: 02/06/2023 1:27 PM MRN: [id number] Endoscopist: Sherilyn Cooter L. Myrtie Neither , MD, [id number] Age: 62 Referring MD:  Date of Birth: 1960-10-27 Gender: Female Account #: [id number] Procedure:                Colonoscopy Indications:              Surveillance: Personal history of adenomatous                            polyps on last colonoscopy > 5 years ago                           Diminutive tubular adenoma August 2017 Medicines:                Monitored Anesthesia Care Procedure:                Pre-Anesthesia Assessment:                           - Prior to the procedure, a History and Physical                            was performed, and patient medications and                            allergies were reviewed. The patient's tolerance of                            previous anesthesia was also reviewed. The risks                            and benefits of the procedure and the sedation                            options and risks were discussed with the patient.                            All questions were answered, and informed consent                            was obtained. Prior Anticoagulants: The patient has                            taken no anticoagulant or antiplatelet agents. ASA                            Grade Assessment: II - A patient with mild systemic                            disease. After reviewing the risks and benefits,                            the patient was deemed in satisfactory condition to  undergo the procedure.                           After obtaining informed consent, the colonoscope                            was passed under direct vision. Throughout the                            procedure, the patient's blood pressure, pulse, and                            oxygen saturations were monitored continuously. The                            [id number] Olympus Scope was  introduced through the                            anus and advanced to the the cecum, identified by                            appendiceal orifice and ileocecal valve. The                            colonoscopy was somewhat difficult due to a                            tortuous colon. The patient tolerated the procedure                            well. The quality of the bowel preparation was                            excellent. The ileocecal valve, appendiceal                            orifice, and rectum were photographed. Scope In: 1:39:57 PM Scope Out: 1:56:36 PM Scope Withdrawal Time: 0 hours 11 minutes 35 seconds  Total Procedure Duration: 0 hours 16 minutes 39 seconds  Findings:                 The perianal and digital rectal examinations were                            normal.                           A diminutive polyp was found in the ascending                            colon. The polyp was sessile. The polyp was removed                            with a cold snare. Resection and retrieval were  complete.                           A 5 mm polyp was found in the proximal sigmoid                            colon. The polyp was semi-pedunculated. The polyp                            was removed with a cold snare. Resection and                            retrieval were complete.                           Multiple diverticula were found in the left colon.                           The exam was otherwise without abnormality on                            direct and retroflexion views. Complications:            No immediate complications. Estimated Blood Loss:     Estimated blood loss was minimal. Impression:               - One diminutive polyp in the ascending colon,                            removed with a cold snare. Resected and retrieved.                           - One 5 mm polyp in the proximal sigmoid colon,                            removed  with a cold snare. Resected and retrieved.                           - Diverticulosis in the left colon.                           - The examination was otherwise normal on direct                            and retroflexion views. Recommendation:           - Patient has a contact number available for                            emergencies. The signs and symptoms of potential                            delayed complications were discussed with the  patient. Return to normal activities tomorrow.                            Written discharge instructions were provided to the                            patient.                           - Resume previous diet.                           - Continue present medications.                           - Await pathology results.                           - Repeat colonoscopy is recommended for                            surveillance. The colonoscopy date will be                            determined after pathology results from today's                            exam become available for review. Raffi Milstein L. Myrtie Neither, MD 02/06/2023 2:03:45 PM This report has been signed electronically.

## 2023-02-10 ENCOUNTER — Telehealth: Payer: Self-pay

## 2023-02-10 NOTE — Telephone Encounter (Signed)
  Follow up Call-     02/06/2023   12:55 PM  Call back number  Post procedure Call Back phone  # [phone]  Permission to leave phone message Yes     Patient questions:  Do you have a fever, pain , or abdominal swelling? No. Pain Score  0 *  Have you tolerated food without any problems? Yes.    Have you been able to return to your normal activities? Yes.    Do you have any questions about your discharge instructions: Diet   No. Medications  No. Follow up visit  No.  Do you have questions or concerns about your Care? No.  Actions: * If pain score is 4 or above: No action needed, pain <4.

## 2023-02-12 ENCOUNTER — Ambulatory Visit
Admission: RE | Admit: 2023-02-12 | Discharge: 2023-02-12 | Disposition: A | Discharge status: Home / Self Care | Payer: 59 | Source: Ambulatory Visit | Attending: Family Medicine | Admitting: Family Medicine

## 2023-02-12 DIAGNOSIS — Z1231 Encounter for screening mammogram for malignant neoplasm of breast: Secondary | ICD-10-CM

## 2023-02-13 ENCOUNTER — Encounter: Payer: Self-pay | Admitting: Gastroenterology

## 2023-02-13 LAB — SURGICAL PATHOLOGY

## 2023-07-16 ENCOUNTER — Other Ambulatory Visit: Payer: Self-pay | Admitting: Family Medicine

## 2023-07-16 DIAGNOSIS — R42 Dizziness and giddiness: Secondary | ICD-10-CM

## 2023-07-17 ENCOUNTER — Ambulatory Visit
Admission: RE | Admit: 2023-07-17 | Discharge: 2023-07-17 | Discharge status: Home / Self Care | Payer: 59 | Source: Ambulatory Visit | Attending: Family Medicine

## 2023-07-17 DIAGNOSIS — R42 Dizziness and giddiness: Secondary | ICD-10-CM

## 2024-01-14 ENCOUNTER — Other Ambulatory Visit: Payer: Self-pay | Admitting: Family Medicine

## 2024-01-14 DIAGNOSIS — Z1231 Encounter for screening mammogram for malignant neoplasm of breast: Secondary | ICD-10-CM

## 2024-01-16 ENCOUNTER — Ambulatory Visit: Attending: Cardiology | Admitting: Cardiology

## 2024-01-16 NOTE — Progress Notes (Deleted)
 Cardiology Office Note:    Date:  01/16/2024   ID:  Kristen [name], DOB [birth year], MRN [id number]  PCP:  Haze Kingfisher, MD  Cardiologist:  None  Electrophysiologist:  None   Referring MD: Haze Kingfisher, MD   No chief complaint on file. ***  History of Present Illness:    Kristen [name] is a 63 y.o. female with a hx of hypertension, hyperlipidemia, OSA who is referred by Dr. Haze for evaluation of dizziness.  Past Medical History:  Diagnosis Date   Allergy    Anxiety    Arthritis    Asthma    Back pain    Seen by Neurosurery   Depression    Headache    Hyperlipidemia    Hypertension    Leg pain    Bil   Macromastia    Obesity    Peripheral neuropathy    Post-menopausal    Pre-diabetes    Sleep apnea    no cpap    Past Surgical History:  Procedure Laterality Date   ANTERIOR CERVICAL DECOMP/DISCECTOMY FUSION N/A 12/12/2020   Procedure: Anterior Cervical Decompression Fusion Cervical four-five, Cervical five-six;  Surgeon: Carollee Lani BROCKS, DO;  Location: [facility] OR;  Service: Neurosurgery;  Laterality: N/A;   BACK SURGERY     BREAST REDUCTION SURGERY Bilateral 02/13/2018   Procedure: MAMMARY REDUCTION  (BREAST);  Surgeon: Arelia Filippo, MD;  Location: [facility];  Service: Plastics;  Laterality: Bilateral;   BREAST SURGERY     carpel tunnel release Bilateral    CHOLECYSTECTOMY N/A 03/16/2020   Procedure: LAPAROSCOPIC CHOLECYSTECTOMY WITH INTRAOPERATIVE CHOLANGIOGRAM;  Surgeon: Belinda Cough, MD;  Location: [facility] OR;  Service: General;  Laterality: N/A;   COLONOSCOPY     LAMINECTOMY  01/20/2008   L4-L5 by Dr Tanda Heading (Ortho), [facility]   POLYPECTOMY     REDUCTION MAMMAPLASTY Bilateral 01/2018   TUBAL LIGATION      Current Medications: No outpatient medications have been marked as taking for the 01/16/24 encounter (Appointment) with Kate Lonni CROME, MD.     Allergies:   Amoxicillin and Penicillins    Social History   Socioeconomic History   Marital status: Single    Spouse name: Not on file   Number of children: 3   Years of education: 13   Highest education level: Some college, no degree  Occupational History   Occupation: Disabled  Tobacco Use   Smoking status: Former    Current packs/day: 0.00    Average packs/day: 0.2 packs/day for 0.5 years (0.1 ttl pk-yrs)    Types: Cigarettes    Start date: 06/20/1990    Quit date: 12/19/1990    Years since quitting: 33.0   Smokeless tobacco: Never   Tobacco comments:    Quit 25 years ago.  Vaping Use   Vaping status: Never Used  Substance and Sexual Activity   Alcohol  use: No    Alcohol /week: 0.0 standard drinks of alcohol     Comment: 05/29/16 none   Drug use: Yes    Frequency: 21.0 times per week    Types: Marijuana   Sexual activity: Yes    Birth control/protection: Post-menopausal  Other Topics Concern   Not on file  Social History Narrative   Lives at home with her daughter, grandkids (3) and great-grandson.   House is one-level, has 2 steps to get in, no hand rail. No throw rugs on the floor. No grab bars in bathroom. Has smoke alarms.   Has one dog.  Likes seafood. Eats salads and fruits. Likes to drink juices.   Occasional use of caffeine .   No real hobbies, likes to watch TV.   Social Drivers of Health   Financial Resource Strain: Medium Risk (04/14/2018)   Overall Financial Resource Strain (CARDIA)    Difficulty of Paying Living Expenses: Somewhat hard  Food Insecurity: No Food Insecurity (04/14/2018)   Hunger Vital Sign    Worried About Running Out of Food in the Last Year: Never true    Ran Out of Food in the Last Year: Never true  Transportation Needs: No Transportation Needs (04/14/2018)   PRAPARE - Administrator, Civil Service (Medical): No    Lack of Transportation (Non-Medical): No  Physical Activity: Insufficiently Active (04/14/2018)   Exercise Vital Sign    Days of Exercise per Week:  3 days    Minutes of Exercise per Session: 30 min  Stress: No Stress Concern Present (04/14/2018)   [employer] of Occupational Health - Occupational Stress Questionnaire    Feeling of Stress : Only a little  Social Connections: Moderately Isolated (04/14/2018)   Social Connection and Isolation Panel    Frequency of Communication with Friends and Family: More than three times a week    Frequency of Social Gatherings with Friends and Family: More than three times a week    Attends Religious Services: Never    Database administrator or Organizations: No    Attends Engineer, structural: Never    Marital Status: Divorced     Family History: The patient's ***family history includes Anemia in her father; Arthritis in her brother; Asthma in her son; Heart disease in her mother; Hypertension in her father; Lupus in her daughter and mother. There is no history of Colon cancer, Colon polyps, Esophageal cancer, Rectal cancer, Stomach cancer, or Breast cancer.  ROS:   Please see the history of present illness.    *** All other systems reviewed and are negative.  EKGs/Labs/Other Studies Reviewed:    The following studies were reviewed today: ***  EKG:  EKG is *** ordered today.  The ekg ordered today demonstrates ***  Recent Labs: No results found for requested labs within last 365 days.  Recent Lipid Panel    Component Value Date/Time   CHOL 262 (H) 06/05/2017 1436   TRIG 98 06/05/2017 1436   HDL 48 06/05/2017 1436   CHOLHDL 5.5 (H) 06/05/2017 1436   CHOLHDL 5.0 07/19/2014 1607   VLDL 19 07/19/2014 1607   LDLCALC 194 (H) 06/05/2017 1436   LDLDIRECT 76 10/28/2013 1002    Physical Exam:    VS:  There were no vitals taken for this visit.    Wt Readings from Last 3 Encounters:  02/06/23 143 lb (64.9 kg)  12/16/22 143 lb (64.9 kg)  08/08/22 147 lb 2 oz (66.7 kg)     GEN: *** Well nourished, well developed in no acute distress HEENT: Normal NECK: No JVD; No  carotid bruits LYMPHATICS: No lymphadenopathy CARDIAC: ***RRR, no murmurs, rubs, gallops RESPIRATORY:  Clear to auscultation without rales, wheezing or rhonchi  ABDOMEN: Soft, non-tender, non-distended MUSCULOSKELETAL:  No edema; No deformity  SKIN: Warm and dry NEUROLOGIC:  Alert and oriented x 3 PSYCHIATRIC:  Normal affect   ASSESSMENT:    No diagnosis found. PLAN:    Lightheadedness:  Hypertension: On carvedilol  25 mg twice daily and olmesartan 40 mg.  Also has losartan  listed ***  Hyperlipidemia: On atorvastatin  20 mg daily  RTC  in***   Medication Adjustments/Labs and Tests Ordered: Current medicines are reviewed at length with the patient today.  Concerns regarding medicines are outlined above.  No orders of the defined types were placed in this encounter.  No orders of the defined types were placed in this encounter.   There are no Patient Instructions on file for this visit.   Signed, Lonni LITTIE Nanas, MD  01/16/2024 5:23 AM    [facility]

## 2024-02-13 ENCOUNTER — Ambulatory Visit
Admission: RE | Admit: 2024-02-13 | Discharge: 2024-02-13 | Disposition: A | Discharge status: Home / Self Care | Source: Ambulatory Visit | Attending: Family Medicine | Admitting: Family Medicine

## 2024-02-13 DIAGNOSIS — Z1231 Encounter for screening mammogram for malignant neoplasm of breast: Secondary | ICD-10-CM

## 2024-03-10 ENCOUNTER — Encounter: Payer: Self-pay | Admitting: Neurology

## 2024-04-08 ENCOUNTER — Other Ambulatory Visit: Payer: Self-pay | Admitting: Family Medicine

## 2024-04-08 DIAGNOSIS — R1031 Right lower quadrant pain: Secondary | ICD-10-CM

## 2024-04-14 ENCOUNTER — Inpatient Hospital Stay
Admission: RE | Admit: 2024-04-14 | Discharge: 2024-04-14 | Disposition: A | Source: Ambulatory Visit | Attending: Family Medicine

## 2024-04-14 DIAGNOSIS — R1031 Right lower quadrant pain: Secondary | ICD-10-CM

## 2024-04-14 MED ORDER — IOPAMIDOL (ISOVUE-300) INJECTION 61%
100.0000 mL | Freq: Once | INTRAVENOUS | Status: AC | PRN
  Administered 2024-04-14: 100 mL via INTRAVENOUS

## 2024-06-09 NOTE — Progress Notes (Signed)
 "  Initial neurology clinic note  Reason for Evaluation: Consultation requested by Haze Kingfisher, MD for an opinion regarding neck spasms, bilateral hand numbness and tingling and jerking, leg weakness, imbalance, and falls. My final recommendations will be communicated back to the requesting physician by way of shared medical record or letter to requesting physician via [place]  mail.  HPI: This is Ms. Bobbette JINNY Lovett, a 64 y.o. 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 The patient is alone today.  Patient's started about 1 year ago (near time of low back surgery). She first noticed neck spasms. She describes this has tightness of the right side of her neck. It comes and goes without clear trigger. It lasts about 10 seconds. It does not cause her head to deviate. There is nothing she does to make it stop.   Her hands (right > left) are numb and tingling. She will notice occasional jerking of her hands when she is at rest. She denies tremor. She has had CTS in the past and had release. She states current symptoms feel different though.  She has leg weakness and a lot of imbalance. She falls frequently, about every other month. She thinks it is because her legs give out. She typically walks with a cane (but does not have it with her today). She will fall even with her cane though.  She endorses both back and neck pain. She denies problems with bowel. She has to wear an adult diaper as she has urinary incontinence. She says she was told this is due to her kidneys. She denies saddle anesthesia.  She states  she has tried to reached out to [facility] but states she has not heard back from them or discussed symptoms with them.  In terms of pain control, she takes baclofen 10 mg TID, Cymbalta  30 mg daily. She was on gabapentin  but stopped as she did not think it was helping. She tried PT but this did not help much.  The patient denies symptoms suggestive of oculobulbar weakness including diplopia, ptosis, dysphagia, poor saliva control, dysarthria/dysphonia, impaired mastication, facial weakness/droop.  She does not report any constitutional symptoms like fever, night sweats, anorexia or unintentional weight loss. Last year she lost a lot of weight of unclear reasons but been stable for last 3 months (gained about 10 lbs back).  EtOH use: none  Restrictive diet? no Family history of neuropathy/myopathy/neurologic disease? Father had nerve problems in hands; brother with neuropathy from Agent Orange (?CIDP per text patient showed me from her brother)   MEDICATIONS:  Outpatient Encounter Medications as of 06/18/2024  Medication Sig   albuterol  (VENTOLIN  HFA) 108 (90 Base) MCG/ACT inhaler Inhale 1-2 puffs into the lungs every 6 (six) hours as needed for wheezing or shortness of breath.   atorvastatin  (LIPITOR) 20 MG tablet Take 20 mg by mouth every morning.   baclofen (LIORESAL) 10 MG tablet Take 1 tablet by mouth 3 (three) times daily as needed for muscle spasms.   carvedilol  (COREG ) 25 MG tablet Take 1 tablet (25 mg total) by mouth 2 (two) times daily with a meal.   diazepam  (VALIUM ) 5 MG tablet Take  5 mg by mouth.   DULoxetine  (CYMBALTA ) 30 MG capsule Take 30 mg by mouth every morning.   fluticasone  (FLONASE ) 50 MCG/ACT nasal spray Place 1 spray into both nostrils daily.   levocetirizine (XYZAL ) 5 MG tablet Take 5 mg by mouth daily.   losartan  (COZAAR ) 50 MG tablet TAKE 1 TABLET TWICE DAILY (Patient taking differently: Take 50 mg by mouth daily.)   montelukast  (SINGULAIR ) 10  MG tablet Take 10 mg by mouth daily.   olmesartan (BENICAR) 40 MG tablet Take 40 mg by mouth daily.   citalopram  (CELEXA ) 20 MG tablet Take 20 mg by mouth daily. (Patient not taking: Reported on 06/18/2024)   diclofenac  Sodium (VOLTAREN ) 1 % GEL Apply 1 application. topically daily as needed (for pain). (Patient not taking: Reported on 06/18/2024)   gabapentin  (NEURONTIN ) 300 MG capsule Take 2 capsules (600 mg total) by mouth 3 (three) times daily. (Patient not taking: Reported on 06/18/2024)   ibandronate (BONIVA) 150 MG tablet Take 150 mg by mouth every 30 (thirty) days. (Patient not taking: Reported on 06/18/2024)   methocarbamol  (ROBAXIN ) 500 MG tablet Take 1 tablet (500 mg total) by mouth every 6 (six) hours as needed for muscle spasms. (Patient not taking: Reported on 06/18/2024)   Vitamin D , Ergocalciferol , (DRISDOL ) 1.25 MG (50000 UNIT) CAPS capsule Take 50,000 Units by mouth once a week. (Patient not taking: Reported on 06/18/2024)   No facility-administered encounter medications on file as of 06/18/2024.    PAST MEDICAL HISTORY: Past Medical History:  Diagnosis Date   Allergy    Anxiety    Arthritis    Asthma    Back pain    Seen by Neurosurery   Depression    Headache    Hyperlipidemia    Hypertension    Leg pain    Bil   Macromastia    Obesity    Peripheral neuropathy    Post-menopausal    Pre-diabetes    Sleep apnea    no cpap    PAST SURGICAL HISTORY: Past Surgical History:  Procedure Laterality Date   ANTERIOR CERVICAL DECOMP/DISCECTOMY FUSION N/A 12/12/2020   Procedure: Anterior Cervical Decompression Fusion Cervical four-five, Cervical five-six;  Surgeon: Carollee Lani BROCKS, DO;  Location: [facility] OR;  Service: Neurosurgery;  Laterality: N/A;   BACK SURGERY     BREAST REDUCTION SURGERY Bilateral 02/13/2018   Procedure: MAMMARY REDUCTION  (BREAST);  Surgeon: Arelia Filippo, MD;  Location: [facility];  Service: Plastics;  Laterality: Bilateral;   BREAST  SURGERY     carpel tunnel release Bilateral    CHOLECYSTECTOMY N/A 03/16/2020   Procedure: LAPAROSCOPIC CHOLECYSTECTOMY WITH INTRAOPERATIVE CHOLANGIOGRAM;  Surgeon: Belinda Cough, MD;  Location: [facility] OR;  Service: General;  Laterality: N/A;   COLONOSCOPY     LAMINECTOMY  01/20/2008   L4-L5 by Dr Tanda Heading (Ortho), [facility]   POLYPECTOMY     REDUCTION MAMMAPLASTY Bilateral 01/2018   TUBAL LIGATION      ALLERGIES: Allergies[1]  FAMILY HISTORY: Family History  Problem Relation Age of Onset   Lupus Mother    Heart disease Mother    Anemia Father    Hypertension Father    Arthritis Brother    Asthma Son    Lupus Daughter    Colon cancer Neg Hx    Colon polyps Neg Hx    Esophageal cancer Neg Hx    Rectal cancer Neg Hx    Stomach cancer Neg Hx    Breast cancer Neg Hx  SOCIAL HISTORY: Social History[2] Social History   Social History Narrative   Lives at home with her daughter, grandkids (3) and great-grandson.   House is one-level, has 2 steps to get in, no hand rail. No throw rugs on the floor. No grab bars in bathroom. Has smoke alarms.   Has one dog.   Likes seafood. Eats salads and fruits. Likes to drink juices.   Occasional use of caffeine .   No real hobbies, likes to watch TV.   Are you right handed or left handed? right   Are you currently employed ?    What is your current occupation?retired   Do you live at home alone?   Who lives with you? husband   What type of home do you live in: 1 story or 2 story? one    Caffiene 1 cup a day     OBJECTIVE: PHYSICAL EXAM: BP 120/83   Pulse (!) 102   Ht 5' 3 (1.6 m)   Wt 153 lb (69.4 kg)   SpO2 97%   BMI 27.10 kg/m   General: General appearance: Awake and alert. No distress. Cooperative with exam.  Skin: No obvious rash or jaundice. HEENT: Atraumatic. Anicteric. Lungs: Non-labored breathing on room air  Heart: Regular Extremities: No edema. No obvious deformity. Psych: Affect  appropriate.  Neurological: Mental Status: Alert. Speech fluent. No pseudobulbar affect Cranial Nerves: CNII: No RAPD. Visual fields grossly intact. CNIII, IV, VI: PERRL. No nystagmus. EOMI. CN V: Facial sensation intact bilaterally to fine touch. CN VII: Facial muscles symmetric and strong. No ptosis at rest. CN VIII: Hearing grossly intact bilaterally. CN IX: No hypophonia. CN X: Palate elevates symmetrically. CN XI: Full strength shoulder shrug bilaterally. CN XII: Tongue protrusion full and midline. No atrophy or fasciculations. No significant dysarthria Motor: Tone is normal. No abnormal movements or tremors appreciated. Strength 5/5 in bilateral upper and lower limbs, including bilateral APB Reflexes:  Right Left   Bicep 2+ 2+   Tricep 2+ 2+   BrRad 2+ 2+   Knee 2-3+ 2-3+ ?Cross adductors  Ankle Trace Trace    Pathological Reflexes: Babinski: flexor response bilaterally Hoffman: present bilaterally (left > right) Troemner: absent bilaterally Sensation: Pinprick: Diminished in right lateral forearm and hand; diminished in left medial and lateral forearm and hand; intact in bilateral lower limbs Vibration: Intact in bilateral great toes Proprioception: Intact in bilateral great toes Coordination: Intact finger-to- nose-finger bilaterally. Romberg negative. Gait: Able to rise from chair with arms crossed unassisted. Narrow-based gait. Reduced arm swing on left  Lab and Test Review: Internal labs: HbA1c (12/07/20): 6.4  External labs: 03/05/24: Mg wnl CMP unremarkable TSH wnl  Imaging/Procedures: CT head (12/07/20): Normal head CT.   MRI cervical spine wo contrast (12/07/20): FINDINGS: Alignment: There is trace anterolisthesis C4 on C5 and 0.2 cm retrolisthesis C5 on C6.   Vertebrae: No fracture is identified. Marrow edema about the C5-6 level is likely degenerative.   Cord: Mild, subtle hazy edema is seen within the cervical cord at C4-5 and C5-6.    Posterior Fossa, vertebral arteries, paraspinal tissues: Mild edema is seen between the C5 and C6 spinous processes.   Disc levels:   C2-3: Left worse than right facet degenerative change. Mild uncovertebral spurring. Moderate left foraminal narrowing. The central canal and right foramen are open.   C3-4: Disc osteophyte complex, uncovertebral disease and mild-to-moderate facet arthropathy result in mild flattening of the ventral cord. Moderate to moderately severe bilateral foraminal narrowing. Left foraminal narrowing  appears slightly worse than on the prior study.   C4-5: Left much worse than right facet degenerative disease has progressed. The disc is uncovered with a disc osteophyte complex and uncovertebral spurring, worse on the left. There is some ligamentum flavum thickening. The cord is deformed and has a triangular configuration. Severe left and moderate right foraminal narrowing.   C5-6: Disc osteophyte complex and uncovertebral spurring, worse on the right. Deformity of the cord is worse on the right where the cord is flattened. Moderately severe to severe bilateral foraminal narrowing. Spondylosis has progressed since the prior exam.   C6-7: Shallow disc bulge without stenosis.   C7-T1: Negative.   IMPRESSION: Mild, hazy edema within the cord at C4-5 and C5-6 is likely secondary to trauma superimposed on advanced degenerative disease at these levels given the patient's history. Spondylosis has progressed at both levels since the prior MRI and the cord is deformed at both levels. There is also severe left foraminal narrowing at C4-5 and moderately severe to severe bilateral foraminal narrowing at C5-6.   Mild edema between the C5-6 spinous processes is worrisome for ligamentous injury.   Negative for fracture. Edema about the C4-5 level is likely degenerative.   Degenerative disease causes mild deformity of the ventral cord at C3-4 where there is moderate  to moderately severe bilateral foraminal narrowing. Spondylosis shows some progression since the prior MRI.   CT cervical spine wo contrast (08/05/21): FINDINGS: Alignment: Straightening of the normal cervical lordosis. No substantial sagittal subluxation.   Skull base and vertebrae: C4-C6 ACDF. No definite fusion across the disc space. No evidence of hardware fracture or findings to suggest loosening. No evidence of acute fracture.   Soft tissues and spinal canal: No prevertebral fluid or swelling. No visible canal hematoma.   Disc levels: Multilevel facet and uncovertebral hypertrophy with potentially severe foraminal stenosis bilaterally at C5-C6. Suspected at least moderate foraminal stenosis at other levels, including C3-C4 and C4-C5.   Upper chest: Visualized lung apices are clear.   IMPRESSION: 1. 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 Suspected at least moderate foraminal stenosis at other levels, including C3-C4 and C4-C5. 2. Canal stenosis is probably improved after C4-C6 ACDF, but evaluation of the canal is limited due to metallic artifact. 3. MRI could better evaluate the canal/cord/foramina if clinically warranted.  MRI lumbar spine wo contrast (09/03/22): FINDINGS: Segmentation: Standard segmentation is assumed. The inferior-most fully formed intervertebral disc is labeled L5-S1.   Alignment: Grade 1 anterolisthesis of L4 on L5, minimally increased. Otherwise, no substantial sagittal subluxation.   Vertebrae: Degenerative/discogenic endplate signal changes at T11-T12. Perifacet edema at L3-L4, likely degenerative. No other marrow edema to suggest acute fracture or discitis/osteomyelitis.   Conus medullaris and cauda equina: Conus extends to the L1-L2 level. Conus appears normal.   Paraspinal and other soft tissues: Negative.   Disc levels:   T11-T12: Right eccentric degenerative  change with associated degenerative/discogenic endplate signal change. Right subarticular and foraminal disc protrusion with right far lateral/extraforaminal component. Resulting severe right foraminal stenosis. Moderate right subarticular recess stenosis and mild central canal stenosis. Left foramen is mildly narrowed. Findings are progressed.   T12-L1: No significant disc protrusion, foraminal stenosis, or canal stenosis.   L1-L2: No significant disc protrusion, foraminal stenosis, or canal stenosis.   L2-L3: No significant disc protrusion, foraminal stenosis, or canal stenosis.   L3-L4: Disc bulging. Ligamentum flavum thickening and facet arthropathy. Resulting mild to moderate canal and bilateral foraminal stenosis. Mildly  progressed.   L4-L5: Grade 1 anterolisthesis, mildly progressed. Uncovering the disc with superimposed disc bulging and bilateral facet arthropathy. Resulting moderate left and mild right foraminal stenosis, mildly progressed. Patent central canal.   L5-S1: Degenerative disc height loss and desiccation. Endplate spurring and disc bulge wound. Resulting moderate to severe bilateral foraminal stenosis. Findings are not substantially changed.   IMPRESSION: 1. Significant progression of right eccentric degenerative change at T11-T12 with right subarticular/foraminal/extraforaminal disc bulge and severe right foraminal stenosis. Associated degenerative/discogenic endplate edema. 2. At L5-S1, moderate to severe bilateral foraminal stenosis, not substantially changed. 3. At L4-L5, grade 1 anterolisthesis with moderate left and mild right foraminal stenosis. Mildly progressed. 4. At L3-L4, mild to moderate canal and bilateral foraminal stenosis. Mildly progressed.  Carotid ultrasound (07/17/23): IMPRESSION: 1. Trace atherosclerotic plaque in bilateral proximal internal carotid arteries. 2. No significant stenosis in either internal carotid artery. 3.  Vertebral arteries are patent with normal antegrade flow.  ASSESSMENT: ZIYA COONROD is a 64 y.o. female who presents for evaluation of neck spasms, hand numbness and tingling, imbalance, and falls. 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 Her neurological examination is pertinent for diminished sensation in right lateral forearm and hand, diminished sensation left medial and lateral forearm and hand, positive Hoffman reflex bilaterally, and ?hyperreflexia at bilateral patella. Available diagnostic data is significant for prior spine imaging showing cervical canal stenosis and multilevel radiculopathy and lumbosacral radiculopathy. The most likely localization that might explain patient's symptoms is the cervical spine. Whether this is new or worsening cervical spine issue or residuals from prior known myelopathy is currently unclear. I will repeat MRI cervical spine to evaluate further. I will also get labs to evaluate for potential mimics.  PLAN: -Blood work: B1, B12, copper, vit E -MRI cervical spine wo contrast -Will consider spine surgery referral pending results of MRI cervical spine -Will increase Cymbalta  to 60 mg to attempt to help with neuropathic pain. Could also consider Lyrica  in the future or pain management referral  -Return to clinic to be determined  The impression above as well as the plan as outlined below were extensively discussed with the patient who voiced understanding. All questions were answered to their satisfaction.  The patient was counseled on pertinent fall precautions per the printed material provided today, and as noted under the Patient Instructions section below.  When available, results of the above investigations and possible  further recommendations will be communicated to the patient via telephone/MyChart. Patient to call office if not contacted after expected testing turnaround time.   Total time spent reviewing records, interview, history/exam, documentation, and coordination of care on day of encounter:  65 min   Thank you for allowing me to participate in patient's care.  If I can answer any additional questions, I would be pleased to do so.  Venetia Potters, MD   CC: Haze Kingfisher, MD [address] [place] [address]  CC: Referring provider: Haze Kingfisher, MD [address] [place],  [place] [address]     [1]  Allergies Allergen Reactions   Amoxicillin Hives   Penicillins Hives, Itching and Rash    Has patient had a PCN reaction causing immediate rash, facial/tongue/throat swelling, SOB or lightheadedness with hypotension: Yes Has patient had a PCN reaction causing severe rash involving mucus membranes or  skin necrosis: No Has patient had a PCN reaction that required hospitalization: No Has patient had a PCN reaction occurring within the last 10 years: No If all of the above answers are NO, then may proceed with Cephalosporin use.   [2]  Social History Tobacco Use   Smoking status: Former    Current packs/day: 0.00    Average packs/day: 0.3 packs/day for 0.5 years (0.1 ttl pk-yrs)    Types: Cigarettes    Start date: 06/20/1990    Quit date: 12/19/1990    Years since quitting: 33.5   Smokeless tobacco: Never   Tobacco comments:    Quit 25 years ago.  Vaping Use   Vaping status: Never Used  Substance Use Topics   Alcohol  use: No    Alcohol /week: 0.0 standard drinks of alcohol     Comment: 05/29/16 none   Drug use: Not Currently    Frequency: 21.0 times per week    Types: Marijuana   "

## 2024-06-18 ENCOUNTER — Encounter: Payer: Self-pay | Admitting: Neurology

## 2024-06-18 ENCOUNTER — Other Ambulatory Visit

## 2024-06-18 ENCOUNTER — Ambulatory Visit (INDEPENDENT_AMBULATORY_CARE_PROVIDER_SITE_OTHER): Admitting: Neurology

## 2024-06-18 VITALS — BP 120/83 | HR 102 | Ht 63.0 in | Wt 153.0 lb

## 2024-06-18 DIAGNOSIS — M5412 Radiculopathy, cervical region: Secondary | ICD-10-CM | POA: Diagnosis not present

## 2024-06-18 DIAGNOSIS — R296 Repeated falls: Secondary | ICD-10-CM | POA: Diagnosis not present

## 2024-06-18 DIAGNOSIS — G959 Disease of spinal cord, unspecified: Secondary | ICD-10-CM

## 2024-06-18 DIAGNOSIS — M5417 Radiculopathy, lumbosacral region: Secondary | ICD-10-CM

## 2024-06-18 DIAGNOSIS — R2689 Other abnormalities of gait and mobility: Secondary | ICD-10-CM

## 2024-06-18 MED ORDER — DULOXETINE HCL 60 MG PO CPEP: 60.0000 mg | ORAL_CAPSULE | Freq: Every day | ORAL | 11 refills | Status: AC

## 2024-06-18 NOTE — Patient Instructions (Addendum)
 I saw you today for numbness and tingling in the hands, imbalance, and falls.  I am concerned that your symptoms are coming from your neck (cervical spine).  I want to get some labs today. I will also get an MRI of your cervical spine. A referral to [facility]) has been placed for your MRI someone will contact you directly to schedule your appt. They are located at [address] [facility]. Please contact them directly by calling [phone] with any questions regarding your referral.  If you do not hear from someone to schedule in 1-2 weeks, please let me know.   I will be in touch when I have those results to discuss next steps.  Please let me know if you have any questions or concerns in the meantime.  The physicians and staff at [facility] are committed to providing excellent care. You may receive a survey requesting feedback about your experience at our office. We strive to receive very good responses to the survey questions. If you feel that your experience would prevent you from giving the office a very good  response, please contact our office to try to remedy the situation. We may be reached at [phone]. Thank you for taking the time out of your busy day to complete the survey.  Venetia Potters, MD [facility]  Preventing Falls at [facility] are common, often dreaded events in the lives of older people. Aside from the obvious injuries and even death that may result, fall can cause wide-ranging consequences including loss of independence, mental decline, decreased activity and mobility. Younger people are also at risk of falling, especially those with chronic illnesses and fatigue.  Ways to reduce risk for falling Examine diet and medications. Warm foods and alcohol  dilate blood vessels, which can lead to dizziness when standing. Sleep aids, antidepressants and pain medications can also increase the likelihood of a fall.  Get a vision exam. Poor vision,  cataracts and glaucoma increase the chances of falling.  Check foot gear. Shoes should fit snugly and have a sturdy, nonskid sole and a broad, low heel  Participate in a physician-approved exercise program to build and maintain muscle strength and improve balance and coordination. Programs that use ankle weights or stretch bands are excellent for muscle-strengthening. Water aerobics programs and low-impact Tai Chi programs have also been shown to improve balance and coordination.  Increase vitamin D  intake. Vitamin D  improves muscle strength and increases the amount of calcium  the body is able to absorb and deposit in bones.  How to prevent falls from common hazards Floors - Remove all loose wires, cords, and throw rugs. Minimize clutter. Make sure rugs are anchored and smooth. Keep furniture in its usual place.  Chairs -- Use chairs with straight backs, armrests and firm seats. Add firm cushions to existing pieces to add height.  Bathroom - Install grab bars and non-skid tape in the tub or shower. Use a bathtub transfer bench or a shower chair with a back support Use an elevated toilet seat and/or safety rails to assist standing from a low surface. Do not use towel racks or bathroom tissue holders to help you stand.  Lighting - Make sure halls, stairways, and entrances are well-lit. Install a night light in your bathroom or hallway. Make sure there is a light switch at the top and bottom of the staircase. Turn lights on if you get up in the middle of the night. Make sure lamps or light switches are  within reach of the bed if you have to get up during the night.  Kitchen - Install non-skid rubber mats near the sink and stove. Clean spills immediately. Store frequently used utensils, pots, pans between waist and eye level. This helps prevent reaching and bending. Sit when getting things out of lower cupboards.  Living room/ Bedrooms - Place furniture with wide spaces in between, giving enough room  to move around. Establish a route through the living room that gives you something to hold onto as you walk.  Stairs - Make sure treads, rails, and rugs are secure. Install a rail on both sides of the stairs. If stairs are a threat, it might be helpful to arrange most of your activities on the lower level to reduce the number of times you must climb the stairs.  Entrances and doorways - Install metal handles on the walls adjacent to the doorknobs of all doors to make it more secure as you travel through the doorway.  Tips for maintaining balance Keep at least one hand free at all times. Try using a backpack or fanny pack to hold things rather than carrying them in your hands. Never carry objects in both hands when walking as this interferes with keeping your balance.  Attempt to swing both arms from front to back while walking. This might require a conscious effort if Parkinson's disease has diminished your movement. It will, however, help you to maintain balance and posture, and reduce fatigue.  Consciously lift your feet off of the ground when walking. Shuffling and dragging of the feet is a common culprit in losing your balance.  When trying to navigate turns, use a U technique of facing forward and making a wide turn, rather than pivoting sharply.  Try to stand with your feet shoulder-length apart. When your feet are close together for any length of time, you increase your risk of losing your balance and falling.  Do one thing at a time. Don't try to walk and accomplish another task, such as reading or looking around. The decrease in your automatic reflexes complicates motor function, so the less distraction, the better.  Do not wear rubber or gripping soled shoes, they might catch on the floor and cause tripping.  Move slowly when changing positions. Use deliberate, concentrated movements and, if needed, use a grab bar or walking aid. Count 15 seconds between each movement. For example,  when rising from a seated position, wait 15 seconds after standing to begin walking.  If balance is a continuous problem, you might want to consider a walking aid such as a cane, walking stick, or walker. Once you've mastered walking with help, you might be ready to try it on your own again.

## 2024-06-24 LAB — VITAMIN E
Gamma-Tocopherol (Vit E): <1 mg/L
Vitamin E (Alpha Tocopherol): 17.2 mg/L (ref 5.7–19.9)

## 2024-06-24 LAB — VITAMIN B12: Vitamin B-12: 1365 pg/mL — ABNORMAL HIGH (ref 200–1100)

## 2024-07-13 ENCOUNTER — Other Ambulatory Visit
